# Patient Record
Sex: Male | Born: 1935 | Race: White | Hispanic: No | Marital: Married | Smoking: Former smoker
Health system: Southern US, Community
[De-identification: ages and names within clinical notes are randomized; demographics above are authoritative.]

## PROBLEM LIST (undated history)

## (undated) DIAGNOSIS — C801 Malignant (primary) neoplasm, unspecified: Secondary | ICD-10-CM

## (undated) DIAGNOSIS — J189 Pneumonia, unspecified organism: Secondary | ICD-10-CM

## (undated) DIAGNOSIS — I639 Cerebral infarction, unspecified: Secondary | ICD-10-CM

## (undated) DIAGNOSIS — H9190 Unspecified hearing loss, unspecified ear: Secondary | ICD-10-CM

## (undated) HISTORY — PX: KIDNEY SURGERY: SHX687

## (undated) HISTORY — PX: APPENDECTOMY: SHX54

## (undated) HISTORY — DX: Unspecified hearing loss, unspecified ear: H91.90

---

## 1997-11-20 ENCOUNTER — Other Ambulatory Visit: Admission: RE | Admit: 1997-11-20 | Discharge: 1997-11-20 | Payer: Self-pay | Admitting: Urology

## 1999-11-24 ENCOUNTER — Encounter (INDEPENDENT_AMBULATORY_CARE_PROVIDER_SITE_OTHER): Payer: Self-pay | Admitting: *Deleted

## 1999-11-24 ENCOUNTER — Ambulatory Visit (HOSPITAL_COMMUNITY): Admission: RE | Admit: 1999-11-24 | Discharge: 1999-11-24 | Payer: Self-pay | Admitting: Gastroenterology

## 2002-01-26 ENCOUNTER — Ambulatory Visit (HOSPITAL_COMMUNITY): Admission: RE | Admit: 2002-01-26 | Discharge: 2002-01-26 | Payer: Self-pay | Admitting: Anesthesiology

## 2005-01-05 ENCOUNTER — Ambulatory Visit (HOSPITAL_COMMUNITY): Admission: RE | Admit: 2005-01-05 | Discharge: 2005-01-05 | Payer: Self-pay | Admitting: Gastroenterology

## 2007-04-18 ENCOUNTER — Encounter: Admission: RE | Admit: 2007-04-18 | Discharge: 2007-04-18 | Payer: Self-pay | Admitting: Family Medicine

## 2008-05-05 ENCOUNTER — Inpatient Hospital Stay (HOSPITAL_COMMUNITY): Admission: EM | Admit: 2008-05-05 | Discharge: 2008-05-06 | Payer: Self-pay | Admitting: General Surgery

## 2008-05-24 ENCOUNTER — Ambulatory Visit (HOSPITAL_COMMUNITY): Admission: RE | Admit: 2008-05-24 | Discharge: 2008-05-24 | Payer: Self-pay | Admitting: Surgery

## 2008-06-26 ENCOUNTER — Ambulatory Visit (HOSPITAL_COMMUNITY): Admission: RE | Admit: 2008-06-26 | Discharge: 2008-06-26 | Payer: Self-pay | Admitting: Gastroenterology

## 2008-06-26 ENCOUNTER — Encounter (INDEPENDENT_AMBULATORY_CARE_PROVIDER_SITE_OTHER): Payer: Self-pay | Admitting: Gastroenterology

## 2008-07-04 ENCOUNTER — Encounter: Payer: Self-pay | Admitting: Urology

## 2008-07-04 ENCOUNTER — Inpatient Hospital Stay (HOSPITAL_COMMUNITY): Admission: AD | Admit: 2008-07-04 | Discharge: 2008-07-08 | Payer: Self-pay | Admitting: Urology

## 2008-12-13 ENCOUNTER — Ambulatory Visit (HOSPITAL_COMMUNITY): Admission: RE | Admit: 2008-12-13 | Discharge: 2008-12-13 | Payer: Self-pay | Admitting: Urology

## 2009-08-22 ENCOUNTER — Encounter (HOSPITAL_COMMUNITY): Admission: RE | Admit: 2009-08-22 | Discharge: 2009-11-06 | Payer: Self-pay | Admitting: Urology

## 2009-09-20 ENCOUNTER — Ambulatory Visit (HOSPITAL_BASED_OUTPATIENT_CLINIC_OR_DEPARTMENT_OTHER): Admission: RE | Admit: 2009-09-20 | Discharge: 2009-09-21 | Payer: Self-pay | Admitting: Urology

## 2010-09-01 ENCOUNTER — Other Ambulatory Visit: Payer: Self-pay | Admitting: Gastroenterology

## 2010-10-15 LAB — POCT HEMOGLOBIN-HEMACUE: Hemoglobin: 9.9 g/dL — ABNORMAL LOW (ref 13.0–17.0)

## 2010-12-08 ENCOUNTER — Other Ambulatory Visit: Payer: Self-pay | Admitting: Dermatology

## 2010-12-09 NOTE — Op Note (Signed)
[name], [name]               ACCOUNT NO.:  [id number]   MEDICAL RECORD NO.:  [id number]          PATIENT TYPE:  AMB   LOCATION:  ENDO                         FACILITY:  [facility]   PHYSICIAN:  James L. Malon Kindle., M.D.DATE OF BIRTH:  12/21/1925   DATE OF PROCEDURE:  06/26/2008  DATE OF DISCHARGE:                               OPERATIVE REPORT   PROCEDURE:  Colonoscopy and polypectomy.   MEDICATIONS:  1. Fentanyl 100 mcg.  2. Versed 8.5 mg IV.   INDICATIONS:  The patient was seen at a hospital and had inflammatory  process of the cecum.  Started on antibiotics.  Repeat CT scan showed  this was improving.  It was felt this was likely appendicitis that had  walled off.  He is clinically much better and is due to have an elective  appendectomy.  The procedure was done to make certain that the remainder  of the colon is okay and that there is no cecal mass there.  The patient  does have a strong family history of colon cancer.   SCOPE:  Pentax pediatric scope.   DESCRIPTION OF PROCEDURE:  The procedure had been explained and patient  consent obtained.  In the left lateral decubitus position, digital exam  was performed and the scope was inserted.  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  He had scattered  diverticula throughout the colon of a minimal nature.  With the patient  in the right lateral decubitus position the cecum was reached.  The  appendix was seen well and photographed.  There was no inflammation.  The terminal ileum was entered for about 10 cm and was normal.  The  scope was withdrawn, and in the distal ascending colon a 0.75 cm sessile  polyp was encountered and removed with the snare.  There was no  bleeding.  The remainder of the colon was free of polyps or other  lesions other than the scattered diverticula and the radiation  proctitis, as noted.  The scope was withdrawn.  The patient tolerated  the procedure well.   ASSESSMENT:  1. Abnormal CT, probably due to appendicitis with no other significant      findings on colonoscopy.  2. Ascending colon polyp, removed.  3. Strong family history of colon cancer in parents and a sister.  4. Radiation proctitis.  5. Scattered diverticula.   PLAN:  We will have the patient followup with Dr. Jamey Ripa for an  appendectomy as planned.  Would recommend repeat colonoscopy in 3 years.  Routine postpolypectomy instructions.           ______________________________  Llana Aliment Malon Kindle., M.D.     Waldron Session  D:  06/26/2008  T:  06/26/2008  Job:  [id number]   cc:   Currie Paris, M.D.  [address]  [place]  [place] [address]   Bryan Lemma. Manus Gunning, M.D.  Fax: [phone]

## 2010-12-09 NOTE — Op Note (Signed)
[name], GESKE               ACCOUNT NO.:  [id number]   MEDICAL RECORD NO.:  [id number]          PATIENT TYPE:  AMB   LOCATION:  DAY                          FACILITY:  [facility]   PHYSICIAN:  Currie Paris, M.D.DATE OF BIRTH:  September 01, 1925   DATE OF PROCEDURE:  07/04/2008  DATE OF DISCHARGE:                               OPERATIVE REPORT   PREOPERATIVE DIAGNOSIS:  Chronic appendicitis.   POSTOPERATIVE DIAGNOSIS:  Chronic appendicitis.   OPERATION:  Laparoscopic appendectomy.   SURGEON:  Currie Paris, M.D.   ASSISTANT:  Bertram Millard. Dahlstedt, M.D.   ANESTHESIA:  General.   CLINICAL HISTORY:  This is an 77 year old gentleman who is about 6 to 8  weeks status post episode of appendicitis diagnosed on CT as a phlegmon.  He was treated with antibiotics and now comes for elective appendectomy.  At the time of his CT scan, he was found to have a renal cell tumor and  at the conclusion of the appendectomy, Dr. Retta Diones planned to do  cryoablation.   DESCRIPTION OF PROCEDURE:  The patient was seen in the holding area.  He  had no further questions.  We reviewed plans for surgery as outlined  above.   The patient was taken to the operating room and after satisfactory  general endotracheal anesthesia had been obtained, catheter was placed  and the patient carefully positioned so that Dr. Retta Diones could perform  his cryoablation of the right kidney.  The abdomen was then prepped and  draped.  The time-out was done.   I used 0.25% plain Marcaine for each incision.  I made an umbilical  incision and opened the fascia, placed a pursestring and introduced the  Hasson.  The abdomen was insufflated to 15.   Under direct vision a 5 mm trocar was placed in the right upper quadrant  and another on the left lower quadrant.   Initially I could not visualize the appendix with the camera in the  umbilical port as the terminal ileum was overlying the area.  I put a 30  degrees 5  mm scope in the left lower quadrant and was able then to see  the tip of the appendix and this was able to be grasped.  With gentle  blunt dissection, some flimsy adhesions that looked like some chronic  inflammatory changes were dissected up and I was able to visualize the  mesoappendix.  The mesoappendix was divided the harmonic scalpel down to  the base of the appendix.  I had it cleaned off  the base clearly  identified.  I put  the Endo-GIA in and fired it across the base.  The appendix was put in a  bag and brought out the umbilical port.  We inspected the staple line  and it appeared completely intact.  There is no bleeding.   At this point Dr. Retta Diones took over to perform the cryoablation.      Currie Paris, M.D.  Electronically Signed     CJS/MEDQ  D:  07/04/2008  T:  07/04/2008  Job:  [id number]   cc:  Bertram Millard. Dahlstedt, M.D.  Fax: [phone]   Llana Aliment. Malon Kindle., M.D.  Fax: [phone]

## 2010-12-09 NOTE — Op Note (Signed)
[name], [name]               ACCOUNT NO.:  [id number]   MEDICAL RECORD NO.:  [id number]          PATIENT TYPE:  OIB   LOCATION:  1425                         FACILITY:  [facility]   PHYSICIAN:  Bertram Millard. Dahlstedt, M.D.DATE OF BIRTH:  1925-12-29   DATE OF PROCEDURE:  07/04/2008  DATE OF DISCHARGE:                               OPERATIVE REPORT   PREOPERATIVE DIAGNOSIS:  Right upper pole renal mass, 3.8 cm.   POSTOPERATIVE DIAGNOSIS:  Right upper pole renal mass, 3.8 cm.   PROCEDURE:  Laparoscopic-assisted cryoablation of right upper pole renal  mass.   SURGEON:  Bertram Millard. Dahlstedt, M.D.   FIRST ASSISTANT:  Aldean Baker, MD   COMPLICATIONS:  None.   ESTIMATED BLOOD LOSS:  Minimal.   BRIEF HISTORY:  Mr. Leitzel is an 77 year old gentleman who presented in  the fall with the right lower quadrant pain.  He was vacationing at the  time in [place].  He had had intermittent pain before then.  He  presented to the emergency room at a hospital in [facility].  He was  found to have a right lower quadrant mass.  This ended up being chronic  appendicitis.  At that time as well, the patient had evidence of a 3.8-  cm right upper pole mass suspicious for renal cell carcinoma.  It was  enhancing.  There was no evidence of metastatic disease.   The patient was seen by Dr. Jamey Ripa in addition to me.  Dr. Jamey Ripa at  this point plans an appendectomy.  His colon has been cleared by Dr.  Sherin Quarry, with recent colonoscopy.  Due to the patient's age and the  fairly exophytic nature of his right upper pole mass, it was suggested  that he undergo cryo ablation rather than partial nephrectomy.  Risks  and complications as well as alternatives have been discussed with the  patient and his wife.  They desire to proceed.   DESCRIPTION OF PROCEDURE:  After successful induction of general  anesthetic, the patient was placed in the right side up position, the  table flexed.  His bladder was  decompressed with a Foley catheter.  All  extremities were padded appropriately.  His entire abdomen was prepped  and draped.  Dr. Jamey Ripa then commenced with port site placement for  laparoscopic appendectomy.  This will be dictated as a separate  procedure.   Following completion of the laparoscopic appendectomy, I then began with  laparoscopic dissection of his right kidney.  In addition to the  subumbilical, right upper quadrant and left lower quadrant trocar  placements, I placed a 5 mm trocar and the upper abdomen just to the  right of midline under direct vision.  A right lower quadrant 5 mm  trocar was also placed.  The right colon was mobilized, and the hepatic  flexure taken down using the Harmonic scalpel.  Once the colon was  mobilized, it was evident that there was a mass visible through Gerota's  fascia.  The lower pole of the kidney was mobilized, first by  identifying the ureter.  Dissection was carried down on  the psoas  muscle, moving in a superior direction such that the lower pole was  mobilized.  The medial aspect of the kidney was dissected, with the  duodenum mobilized medially.  The superior pole of the kidney was then  mobilized.  This was somewhat difficult getting the entire kidney, the  upper pole, mobilized due to the fairly dense attachments.  The  attachments between the upper pole of the kidney and the liver were  carefully taken down with the Harmonic scalpel.  Once adequate  mobilization was completed, it was felt that the kidney could be  mobilized anteriorly enough, with the liver retracted superiorly, to  place needles for the cryotherapy through the right upper quadrant.  Four separate 14-gauge Angiocaths were placed through the right upper  quadrant after a finder needle was passed, verifying that these  Angiocaths were directly above the renal mass.  Four angiocath needles  were placed, such that they were each approximately 9-10 mm apart in a   symmetric, square fashion.  The cryo needles were then placed in a  symmetric fashion into the renal mass, such that they were distributed  evenly, so that a symmetric ice ball would form.  Following placement of  these needles just under 4 cm into the renal mass, a 15 second freeze  cycle was used to fix the needles in.  At this point a Tru-Cut biopsy  needle was placed and two  cores were taken for biopsies from this renal  mass and sent as biopsy of renal mass.  Following this, the first  freeze cycle was performed for 10 minutes.  A thermal sensor was placed  about 5 mm outside the kidney, into the parenchyma.  Visual inspection  during the freezing cycle, and using the ultrasound probe, demonstrated  an excellent ice ball encompassing the whole renal tumor.  A 6-minute  thaw cycle was performed, and then a second 8-minute freeze cycle.  Following this, the ice ball was thawed again, and the ice rinsed off  with the irrigator.  Once adequate thaw was performed, the needles were  removed.  There was no significant bleeding at this point.  FloSeal was  placed over all the puncture sites, and Surgicel was placed over top of  that.  At this point, inspection of the entire dissected kidney revealed  no significant bleeding.  Trocars were all removed under direct vision,  the pneumoperitoneum was released.  Angiocaths were removed from the  anterior abdomen.  Dr. Jamey Ripa had placed a pursestring suture in the  patient's fascia using 0 Vicryl at the Hasson cannula port.  This was  sutured shut.  The 5 mm trocar sites were not closed at the fascial  level.  Skin staples were placed.  Dry sterile dressings were then  placed..  The patient tolerated procedure well.  He was awakened.  He  was taken to the PACU in stable condition.      Bertram Millard. Dahlstedt, M.D.  Electronically Signed     SMD/MEDQ  D:  07/05/2008  T:  07/05/2008  Job:  [id number]   cc:   Currie Paris, M.D.  [address]  [place]  [place] [address]   Bryan Lemma. Manus Gunning, M.D.  Fax: [phone]

## 2010-12-09 NOTE — H&P (Signed)
[name], [name]               ACCOUNT NO.:  [id number]   MEDICAL RECORD NO.:  [id number]          PATIENT TYPE:  INP   LOCATION:                               FACILITY:  [facility]   PHYSICIAN:  Adolph Pollack, M.D.DATE OF BIRTH:  April 04, 1926   DATE OF ADMISSION:  05/05/2008  DATE OF DISCHARGE:  05/06/2008                              HISTORY & PHYSICAL   REASON FOR ADMISSION:  Right lower quadrant abscess.   HISTORY:  [name] is an 77 year old male who had the onset of severe  right lower quadrant pain with nausea and vomiting on October 7 while he  was at [facility].  He sought attention in the emergency  department the next day, and was noted to have a leukocytosis of 16,000.  He was sent for a CT scan which demonstrated a right lower quadrant  abscess with a questionable mass in the cecum as well as a mass in the  upper right kidney.  However, he was not given oral contrast.  It was  recommended that he be admitted to the hospital, started on antibiotics  and then to have surgical consultation, but the patient did not want to  do this and instead wanted to come back to [place].  His daughter has  now brought him back to [facility] today for surgical evaluation.  Currently he said he feels fine.  He is hungry.  He is having some  diarrhea but no fever or chills.  He had been given Cipro and Flagyl to  take and he has been taking them as directed.   Of note was that about 2-3 months ago, he had some similar pain and an  episode of vomiting, and the pain resolved.  For the past 2 or 3 weeks  however, he has been having intermittent episodes, but nothing quite  this bad.   PAST MEDICAL HISTORY:  1. Prostate cancer.  2. Hypertension.   PREVIOUS OPERATIONS:  1. Tonsillectomy.  2. Radioactive seed implantation for his prostate cancer.   DRUG ALLERGIES:  None.   CURRENT MEDICATIONS:  Cipro and Flagyl.  He also takes HCTZ p.r.n. for  high blood pressure.   SOCIAL HISTORY:  He is married.  A former smoker (quit in 1959).  Has  one alcoholic beverage a night on average.   FAMILY HISTORY:  Notable for colon cancer in his father as well as heart  disease.   REVIEW OF SYSTEMS:  GENERAL:  He has not had a problem with unexplained  weight loss.  CARDIOVASCULAR:  Denies any heart disease.  PULMONARY:  Denies asthma, pneumonia.  GI: Denies any melena, hematochezia, peptic  ulcer disease, hepatitis, diverticulitis.  GU:  Denies kidney stones.  He did have external beam radiation therapy for his prostate cancer.  ENDOCRINE:  No diabetes, hypercholesterolemia.  NEUROLOGIC:  No strokes  or seizures.  HEMATOLOGIC:  No bleeding disorders, blood clots or  transfusions.   PHYSICAL EXAM:  GENERAL:  An elderly male in no acute distress.  Pleasant and cooperative.  VITAL SIGNS: Temperature is 97.3, blood pressure is 157/81, pulse  of 65.  EYES:  Extraocular motions intact.  No icterus.  NECK: Supple without masses or obvious thyroid enlargement.  RESPIRATORY: Breath sounds equal and clear.  Respirations unlabored.  CARDIOVASCULAR:  Regular rate. regular rhythm.  No murmur.  ABDOMEN: Soft.  There is some mild right lower quadrant tenderness and  fullness, but no peritoneal signs.  His abdomen is flat, nondistended.  He has active bowel sounds.  MUSCULOSKELETAL:  He has some trace edema bilaterally in the lower  extremities.  Good range of motion.   LABORATORY DATA:  Done at the emergency department on May 03, 2008,  demonstrated a white blood cell count of 16,000, hemoglobin of 13.4.  Electrolytes within normal limits and albumin was 3.7.  Urinalysis  within normal limits as well.  I have reviewed the CT scan on the DVD.   IMPRESSION:  Right lower quadrant abscess, etiology unclear this time.  Differential diagnosis could be chronic perforated appendicitis with  abscess, perforated cecal tumor, possible perforated cecal  diverticulitis.  Currently he  appears well without any peritoneal signs.   PLAN:  Admit to the hospital and convert to IV antibiotics.  Get an  orally and IV contrasted CT scan.  Based on that we will need to discuss  whether we ought to do percutaneous drainage of the abscess.  I would  also like to define this process in the cecum a little better with the  oral contrast CT scan.  Both he and wife understand the plan and rare in  agreement.      Adolph Pollack, M.D.  Electronically Signed     TJR/MEDQ  D:  05/05/2008  T:  05/06/2008  Job:  [id number]   cc:   Bertram Millard. Dahlstedt, M.D.  Fax: [phone]   Inger, Dr. Molly Maduro ?

## 2010-12-12 NOTE — Procedures (Signed)
[place]. [facility]  Patient:    [name], [name] Visit Number: [id number] MRN: [id number]          Service Type: DSU Location: [facility] 2899 [id number] Attending Physician:  Orland Mustard Dictated by:   Llana Aliment Randa Evens, M.D. Admit Date:  01/26/2002 Discharge Date: 01/26/2002   CC:         Dyanne Carrel, M.D.   Procedure Report  DATE OF BIRTH:  02/11/1926  PROCEDURE:  Colonoscopy.  MEDICATIONS:  Fentanyl 75 mcg, Versed 6 mg IV.  SCOPE: Pediatric Olympus video colonoscope.  INDICATIONS:  Followup for colon polyps.  DESCRIPTION OF PROCEDURE:  The procedure had been explained, and patient consent obtained.  With the patient in the lithotomy position, the Olympus pediatric video colonoscope was inserted and advanced under direct visualization.  The prep was excellent.  We were able to reach the cecum without difficulty.  The ileocecal valve and appendiceal orifice seen.  The scope withdrawn in the cecum, ascending colon, transverse colon.  The transverse colon, splenic flexure, descending and sigmoid colon were seen well.  No polyps or other lesions seen.  The patient tolerated the procedure well and was maintained on low-flow oxygen and pulse oximetry throughout the procedure.  ASSESSMENT: Essentially normal colonoscopy.  No evidence of further polyps.  PLAN: Will recommend proceeding with procedure in three years. Dictated by:   Llana Aliment. Randa Evens, M.D. Attending Physician:  Orland Mustard DD:  01/26/02 TD:  01/30/02 Job: [id number]

## 2010-12-12 NOTE — Op Note (Signed)
NAMESHRIHAAN, PORZIO               ACCOUNT NO.:  [id number]   MEDICAL RECORD NO.:  [id number]          PATIENT TYPE:  AMB   LOCATION:  ENDO                         FACILITY:  [facility]   PHYSICIAN:  James L. Malon Kindle., M.D.DATE OF BIRTH:  Dec 19, 1925   DATE OF PROCEDURE:  01/05/2005  DATE OF DISCHARGE:                                 OPERATIVE REPORT   PROCEDURE:  Colonoscopy.   MEDICATIONS:  Fentanyl 75 mcg, Versed 6 mg IV.   SCOPE:  Olympus pediatric adjustable colonoscope.   INDICATIONS FOR PROCEDURE:  This is done as a follow-up procedure.  The  patient has had previous polyps removed and has a family history of colon  cancer.   DESCRIPTION OF PROCEDURE:  The procedure is explained to the patient and  consent obtained.  In the left lateral decubitus position, the Olympus scope  was inserted and advanced.  The prep was excellent.  We were able to reach  the cecum without difficulty.  The ileocecal valve and appendiceal orifice  seen.  Scope withdrawn.  Cecum, ascending colon, transverse colon, splenic  flexure, descending colon, and sigmoid were all normal.  No diverticulosis.  No polyps were found throughout.  Mucosa was normal.  Rectum free of polyps,  some internal hemorrhoids.  The scope was withdrawn.  The patient tolerated  the procedure well.   ASSESSMENT:  1.  History of colon polyps in the past with negative colonoscopy at this      time.  V12.72.  2.  Family history of colon cancer.  V16.0.   PLAN:  Yearly hemoccults and will repeat colonoscopy in 5 years or for  specific reasons such as positive stool.       JLE/MEDQ  D:  01/05/2005  T:  01/05/2005  Job:  [id number]   cc:   Bryan Lemma. Manus Gunning, M.D.  [address] [place]  [place] [address]  Fax: [phone]

## 2010-12-12 NOTE — Procedures (Signed)
[place]  Patient:    [name], [name]                      MRN: [id number] Proc. Date: 11/24/99 Adm. Date:  [id number] Attending:  Orland Mustard CC:         Dyanne Carrel, M.D.                           Procedure Report  PROCEDURE:  Colonoscopy and polypectomy  MEDICATIONS:  Fentanyl 100 mcg, Versed 7.5 mg IV.  INDICATIONS:  A pleasant 77 year old gentleman, who had prostate cancer and has a strong family history of colon cancer.  Father died at 45 with colon cancer.  Colonoscopy is performed to look for any colon neoplasia.  DESCRIPTION OF PROCEDURE:  Procedure has been explained to patient, consent obtained.  Patient in left lateral decubitus position.  Olympus adult video colonoscope inserted, advanced under direct visualization.  Prep excellent, able to advance to the cecum using position changes and abdominal pressure. Ileocecal valve was seen, right lower quadrant was transilluminated.  Scope withdrawn.  Cecum, ascending colon, hepatic flexure, transverse colon see well.  In the vicinity, the distal transverse colon, splenic flexure, descending colon in that area, three polyps were found, each approximately 0.5 cm in diameter.  They each were removed with a snare and sucked through the scope.  The remainder of the descending colon and sigmoid were unremarkable. Some internal hemorrhoids were seen in rectum upon removal of the scope. Scope withdrawn.  Patient tolerated the procedure well, was maintained on low-flow oxygen and pulse oximeter throughout the procedure with no obvious problem.  ASSESSMENT:  Splenic flexure, descending colon polyps removed.  PLAN:  Will recommend repeating procedure in two years. DD:  11/24/99 TD:  11/24/99 Job: [id number]

## 2010-12-12 NOTE — Discharge Summary (Signed)
NAMETIMOTHEUS, SALM               ACCOUNT NO.:  [id number]   MEDICAL RECORD NO.:  [id number]          PATIENT TYPE:  INP   LOCATION:                               FACILITY:  [facility]   PHYSICIAN:  Bertram Millard. Dahlstedt, M.D.DATE OF BIRTH:  [birth year]   DATE OF ADMISSION:  07/04/2008  DATE OF DISCHARGE:  07/08/2008                               DISCHARGE SUMMARY   PRIMARY DIAGNOSES:  1. Chronic appendicitis.  2. Renal cell carcinoma of right kidney, upper pole   SURGICAL PROCEDURES:  July 04, 2008, laparoscopic assisted right  appendectomy and laparoscopic-assisted cryoablation of right renal mass.   BRIEF HISTORY:  Mr. Knupp is an 77 year old male who originally  presented in the late fall with abdominal pain.  He was at [facility] at that time.  CT scan of abdomen revealed a right upper pole  renal mass, as well as a cecal mass.  Further evaluation here in  [place] revealed the patient had an enhancing 3-1/2 cm right upper  pole mass and probable chronic appendicitis.  Dr. Cyndia Bent plans  a laparoscopic assisted appendectomy.  Due to the patient's age and the  exophytic lesion, it was recommended that he undergo attempted  cryoablation of this renal mass.  He presents this time for those  procedures, having been instructed in risks and complications.   HOSPITAL COURSE:  The patient was admitted directly to the operating  room.  He first underwent a laparoscopic right appendectomy by Dr.  Jamey Ripa.  Following that, a laparoscopic assisted biopsy and cryoablation  of the right upper pole renal mass was performed.  He tolerated these  procedures well.  Biopsy of the tumor revealed renal cell carcinoma.  He  had a bit of ileus postoperatively.  This eventually resolved.  He did  not need nasogastric tube.  He was eventually advanced to a regular  diet.  He tolerated this well.  His hematocrit was 26.2% on the 13th,  the day of his discharge.  He was comfortable,  however.  Incisions were  all healing well.  He was discharged on the 13th, in improved condition.   DISCHARGE MEDICATIONS:  Per reconciliation sheet.   DISCHARGE INSTRUCTIONS/FOLLOWUP:  He was given discharge instructions  and follow-up with Dr. Retta Diones in approximately one week for staple  removal.      Bertram Millard. Dahlstedt, M.D.  Electronically Signed     SMD/MEDQ  D:  08/16/2008  T:  08/16/2008  Job:  [id number]

## 2011-04-28 LAB — COMPREHENSIVE METABOLIC PANEL
Alkaline Phosphatase: 76
BUN: 11
Calcium: 9.1
GFR calc Af Amer: >60
GFR calc non Af Amer: >60
Glucose, Bld: 105 — ABNORMAL HIGH
Potassium: 4
Total Protein: 6.1

## 2011-04-28 LAB — CBC
HCT: 36.9 — ABNORMAL LOW
Hemoglobin: 12.4 — ABNORMAL LOW
MCV: 88.8
Platelets: 349
RDW: 12.4

## 2011-04-28 LAB — APTT: aPTT: 41 — ABNORMAL HIGH

## 2011-04-28 LAB — PROTIME-INR
INR: 1.2
Prothrombin Time: 15.9 — ABNORMAL HIGH

## 2011-05-01 LAB — CBC
HCT: 27.7 % — ABNORMAL LOW (ref 39.0–52.0)
HCT: 40.5 % (ref 39.0–52.0)
Hemoglobin: 13.7 g/dL (ref 13.0–17.0)
Hemoglobin: 14.4 g/dL (ref 13.0–17.0)
Hemoglobin: 9.2 g/dL — ABNORMAL LOW (ref 13.0–17.0)
Hemoglobin: 9.7 g/dL — ABNORMAL LOW (ref 13.0–17.0)
MCHC: 33.8 g/dL (ref 30.0–36.0)
MCHC: 34.3 g/dL (ref 30.0–36.0)
MCV: 88.9 fL (ref 78.0–100.0)
MCV: 89.4 fL (ref 78.0–100.0)
MCV: 89.8 fL (ref 78.0–100.0)
Platelets: 203 10*3/uL (ref 150–400)
Platelets: 255 10*3/uL (ref 150–400)
RBC: 2.97 MIL/uL — ABNORMAL LOW (ref 4.22–5.81)
RBC: 3.1 MIL/uL — ABNORMAL LOW (ref 4.22–5.81)
RBC: 3.65 MIL/uL — ABNORMAL LOW (ref 4.22–5.81)
RBC: 4.74 MIL/uL (ref 4.22–5.81)
RDW: 13.7 % (ref 11.5–15.5)
WBC: 10 10*3/uL (ref 4.0–10.5)
WBC: 10.3 10*3/uL (ref 4.0–10.5)
WBC: 14.3 10*3/uL — ABNORMAL HIGH (ref 4.0–10.5)
WBC: 6.3 10*3/uL (ref 4.0–10.5)
WBC: 9.3 10*3/uL (ref 4.0–10.5)

## 2011-05-01 LAB — BASIC METABOLIC PANEL
BUN: 14 mg/dL (ref 6–23)
CO2: 24 mEq/L (ref 19–32)
Chloride: 100 mEq/L (ref 96–112)
Chloride: 99 mEq/L (ref 96–112)
Creatinine, Ser: 0.84 mg/dL (ref 0.4–1.5)
GFR calc non Af Amer: >60 mL/min (ref >60)
Glucose, Bld: 141 mg/dL — ABNORMAL HIGH (ref 70–99)
Potassium: 3.9 mEq/L (ref 3.5–5.1)
Sodium: 130 mEq/L — ABNORMAL LOW (ref 135–145)

## 2011-05-01 LAB — COMPREHENSIVE METABOLIC PANEL
AST: 21 U/L (ref 0–37)
Albumin: 3.7 g/dL (ref 3.5–5.2)
CO2: 27 mEq/L (ref 19–32)
Calcium: 9.8 mg/dL (ref 8.4–10.5)
Creatinine, Ser: 0.95 mg/dL (ref 0.4–1.5)
Sodium: 136 mEq/L (ref 135–145)
Total Bilirubin: 1 mg/dL (ref 0.3–1.2)
Total Protein: 6.4 g/dL (ref 6.0–8.3)

## 2011-05-01 LAB — URINALYSIS, ROUTINE W REFLEX MICROSCOPIC
Glucose, UA: NEGATIVE mg/dL
Hgb urine dipstick: NEGATIVE
Ketones, ur: NEGATIVE mg/dL
Protein, ur: NEGATIVE mg/dL

## 2011-05-01 LAB — DIFFERENTIAL
Basophils Absolute: 0 10*3/uL (ref 0.0–0.1)
Basophils Absolute: 0 10*3/uL (ref 0.0–0.1)
Basophils Relative: 0 % (ref 0–1)
Eosinophils Absolute: 0 10*3/uL (ref 0.0–0.7)
Eosinophils Absolute: 0.1 10*3/uL (ref 0.0–0.7)
Eosinophils Relative: 0 % (ref 0–5)
Lymphs Abs: 1 10*3/uL (ref 0.7–4.0)
Monocytes Absolute: 0.9 10*3/uL (ref 0.1–1.0)
Monocytes Relative: 9 % (ref 3–12)
Neutrophils Relative %: 77 % (ref 43–77)

## 2011-05-01 LAB — HEMOGLOBIN AND HEMATOCRIT, BLOOD
HCT: 26.2 % — ABNORMAL LOW (ref 39.0–52.0)
HCT: 27.5 % — ABNORMAL LOW (ref 39.0–52.0)
HCT: 28.8 % — ABNORMAL LOW (ref 39.0–52.0)
Hemoglobin: 8.8 g/dL — ABNORMAL LOW (ref 13.0–17.0)
Hemoglobin: 9.4 g/dL — ABNORMAL LOW (ref 13.0–17.0)

## 2011-05-01 LAB — TYPE AND SCREEN: ABO/RH(D): O POS

## 2011-05-01 LAB — PSA: PSA: 1.24 ng/mL (ref 0.10–4.00)

## 2011-05-01 LAB — FREE PSA: PSA, Free Pct: 8 % — ABNORMAL LOW (ref >25)

## 2011-07-31 DIAGNOSIS — L570 Actinic keratosis: Secondary | ICD-10-CM | POA: Diagnosis not present

## 2011-07-31 DIAGNOSIS — L578 Other skin changes due to chronic exposure to nonionizing radiation: Secondary | ICD-10-CM | POA: Diagnosis not present

## 2011-08-11 ENCOUNTER — Ambulatory Visit (HOSPITAL_COMMUNITY)
Admission: RE | Admit: 2011-08-11 | Discharge: 2011-08-11 | Disposition: A | Discharge status: Home / Self Care | Payer: Medicare Other | Source: Ambulatory Visit | Attending: Urology | Admitting: Urology

## 2011-08-11 ENCOUNTER — Other Ambulatory Visit: Payer: Self-pay | Admitting: Urology

## 2011-08-11 DIAGNOSIS — C649 Malignant neoplasm of unspecified kidney, except renal pelvis: Secondary | ICD-10-CM

## 2011-08-11 DIAGNOSIS — J984 Other disorders of lung: Secondary | ICD-10-CM | POA: Insufficient documentation

## 2011-08-11 DIAGNOSIS — C61 Malignant neoplasm of prostate: Secondary | ICD-10-CM | POA: Diagnosis not present

## 2011-08-11 DIAGNOSIS — N320 Bladder-neck obstruction: Secondary | ICD-10-CM | POA: Diagnosis not present

## 2011-08-12 DIAGNOSIS — C649 Malignant neoplasm of unspecified kidney, except renal pelvis: Secondary | ICD-10-CM | POA: Diagnosis not present

## 2011-08-13 DIAGNOSIS — H26499 Other secondary cataract, unspecified eye: Secondary | ICD-10-CM | POA: Diagnosis not present

## 2011-08-14 DIAGNOSIS — C649 Malignant neoplasm of unspecified kidney, except renal pelvis: Secondary | ICD-10-CM | POA: Diagnosis not present

## 2011-08-14 DIAGNOSIS — C61 Malignant neoplasm of prostate: Secondary | ICD-10-CM | POA: Diagnosis not present

## 2011-08-20 DIAGNOSIS — H26499 Other secondary cataract, unspecified eye: Secondary | ICD-10-CM | POA: Diagnosis not present

## 2011-12-04 DIAGNOSIS — L570 Actinic keratosis: Secondary | ICD-10-CM | POA: Diagnosis not present

## 2011-12-04 DIAGNOSIS — L578 Other skin changes due to chronic exposure to nonionizing radiation: Secondary | ICD-10-CM | POA: Diagnosis not present

## 2011-12-04 DIAGNOSIS — L821 Other seborrheic keratosis: Secondary | ICD-10-CM | POA: Diagnosis not present

## 2012-02-04 DIAGNOSIS — C649 Malignant neoplasm of unspecified kidney, except renal pelvis: Secondary | ICD-10-CM | POA: Diagnosis not present

## 2012-02-04 DIAGNOSIS — C61 Malignant neoplasm of prostate: Secondary | ICD-10-CM | POA: Diagnosis not present

## 2012-04-30 DIAGNOSIS — Z23 Encounter for immunization: Secondary | ICD-10-CM | POA: Diagnosis not present

## 2012-07-08 ENCOUNTER — Other Ambulatory Visit: Payer: Self-pay | Admitting: Dermatology

## 2012-07-08 DIAGNOSIS — L570 Actinic keratosis: Secondary | ICD-10-CM | POA: Diagnosis not present

## 2012-07-08 DIAGNOSIS — L819 Disorder of pigmentation, unspecified: Secondary | ICD-10-CM | POA: Diagnosis not present

## 2012-07-08 DIAGNOSIS — Z85828 Personal history of other malignant neoplasm of skin: Secondary | ICD-10-CM | POA: Diagnosis not present

## 2012-07-08 DIAGNOSIS — D0439 Carcinoma in situ of skin of other parts of face: Secondary | ICD-10-CM | POA: Diagnosis not present

## 2012-07-08 DIAGNOSIS — L821 Other seborrheic keratosis: Secondary | ICD-10-CM | POA: Diagnosis not present

## 2012-07-08 DIAGNOSIS — D0420 Carcinoma in situ of skin of unspecified ear and external auricular canal: Secondary | ICD-10-CM | POA: Diagnosis not present

## 2012-07-08 DIAGNOSIS — D485 Neoplasm of uncertain behavior of skin: Secondary | ICD-10-CM | POA: Diagnosis not present

## 2012-07-08 DIAGNOSIS — L578 Other skin changes due to chronic exposure to nonionizing radiation: Secondary | ICD-10-CM | POA: Diagnosis not present

## 2012-07-26 DIAGNOSIS — C44221 Squamous cell carcinoma of skin of unspecified ear and external auricular canal: Secondary | ICD-10-CM | POA: Diagnosis not present

## 2012-07-26 DIAGNOSIS — L570 Actinic keratosis: Secondary | ICD-10-CM | POA: Diagnosis not present

## 2012-08-08 DIAGNOSIS — C649 Malignant neoplasm of unspecified kidney, except renal pelvis: Secondary | ICD-10-CM | POA: Diagnosis not present

## 2012-08-09 DIAGNOSIS — C61 Malignant neoplasm of prostate: Secondary | ICD-10-CM | POA: Diagnosis not present

## 2012-08-09 DIAGNOSIS — C649 Malignant neoplasm of unspecified kidney, except renal pelvis: Secondary | ICD-10-CM | POA: Diagnosis not present

## 2012-08-10 DIAGNOSIS — C649 Malignant neoplasm of unspecified kidney, except renal pelvis: Secondary | ICD-10-CM | POA: Diagnosis not present

## 2012-08-10 DIAGNOSIS — C61 Malignant neoplasm of prostate: Secondary | ICD-10-CM | POA: Diagnosis not present

## 2012-09-16 DIAGNOSIS — K625 Hemorrhage of anus and rectum: Secondary | ICD-10-CM | POA: Diagnosis not present

## 2012-09-16 DIAGNOSIS — K921 Melena: Secondary | ICD-10-CM | POA: Diagnosis not present

## 2012-09-16 DIAGNOSIS — C61 Malignant neoplasm of prostate: Secondary | ICD-10-CM | POA: Diagnosis not present

## 2012-09-19 DIAGNOSIS — K921 Melena: Secondary | ICD-10-CM | POA: Diagnosis not present

## 2012-10-10 DIAGNOSIS — K921 Melena: Secondary | ICD-10-CM | POA: Diagnosis not present

## 2012-10-13 DIAGNOSIS — K921 Melena: Secondary | ICD-10-CM | POA: Diagnosis not present

## 2012-10-21 DIAGNOSIS — K921 Melena: Secondary | ICD-10-CM | POA: Diagnosis not present

## 2012-12-12 ENCOUNTER — Other Ambulatory Visit: Payer: Self-pay | Admitting: Dermatology

## 2012-12-12 DIAGNOSIS — C649 Malignant neoplasm of unspecified kidney, except renal pelvis: Secondary | ICD-10-CM | POA: Diagnosis not present

## 2012-12-12 DIAGNOSIS — Z85828 Personal history of other malignant neoplasm of skin: Secondary | ICD-10-CM | POA: Diagnosis not present

## 2012-12-12 DIAGNOSIS — I10 Essential (primary) hypertension: Secondary | ICD-10-CM | POA: Diagnosis not present

## 2012-12-12 DIAGNOSIS — D485 Neoplasm of uncertain behavior of skin: Secondary | ICD-10-CM | POA: Diagnosis not present

## 2012-12-12 DIAGNOSIS — L570 Actinic keratosis: Secondary | ICD-10-CM | POA: Diagnosis not present

## 2012-12-12 DIAGNOSIS — L578 Other skin changes due to chronic exposure to nonionizing radiation: Secondary | ICD-10-CM | POA: Diagnosis not present

## 2012-12-12 DIAGNOSIS — B079 Viral wart, unspecified: Secondary | ICD-10-CM | POA: Diagnosis not present

## 2012-12-12 DIAGNOSIS — C4492 Squamous cell carcinoma of skin, unspecified: Secondary | ICD-10-CM | POA: Diagnosis not present

## 2012-12-12 DIAGNOSIS — L820 Inflamed seborrheic keratosis: Secondary | ICD-10-CM | POA: Diagnosis not present

## 2012-12-12 DIAGNOSIS — Z0000 Encounter for general adult medical examination without abnormal findings: Secondary | ICD-10-CM | POA: Diagnosis not present

## 2012-12-12 DIAGNOSIS — C61 Malignant neoplasm of prostate: Secondary | ICD-10-CM | POA: Diagnosis not present

## 2012-12-12 DIAGNOSIS — E7800 Pure hypercholesterolemia, unspecified: Secondary | ICD-10-CM | POA: Diagnosis not present

## 2013-02-01 DIAGNOSIS — C61 Malignant neoplasm of prostate: Secondary | ICD-10-CM | POA: Diagnosis not present

## 2013-02-08 DIAGNOSIS — C61 Malignant neoplasm of prostate: Secondary | ICD-10-CM | POA: Diagnosis not present

## 2013-02-08 DIAGNOSIS — C649 Malignant neoplasm of unspecified kidney, except renal pelvis: Secondary | ICD-10-CM | POA: Diagnosis not present

## 2013-05-23 DIAGNOSIS — Z23 Encounter for immunization: Secondary | ICD-10-CM | POA: Diagnosis not present

## 2013-08-08 DIAGNOSIS — K5900 Constipation, unspecified: Secondary | ICD-10-CM | POA: Diagnosis not present

## 2013-08-08 DIAGNOSIS — Z800 Family history of malignant neoplasm of digestive organs: Secondary | ICD-10-CM | POA: Diagnosis not present

## 2013-08-08 DIAGNOSIS — K6289 Other specified diseases of anus and rectum: Secondary | ICD-10-CM | POA: Diagnosis not present

## 2013-08-08 DIAGNOSIS — Z86010 Personal history of colonic polyps: Secondary | ICD-10-CM | POA: Diagnosis not present

## 2013-08-09 DIAGNOSIS — C61 Malignant neoplasm of prostate: Secondary | ICD-10-CM | POA: Diagnosis not present

## 2013-08-09 DIAGNOSIS — C649 Malignant neoplasm of unspecified kidney, except renal pelvis: Secondary | ICD-10-CM | POA: Diagnosis not present

## 2013-08-11 DIAGNOSIS — C61 Malignant neoplasm of prostate: Secondary | ICD-10-CM | POA: Diagnosis not present

## 2013-08-11 DIAGNOSIS — C649 Malignant neoplasm of unspecified kidney, except renal pelvis: Secondary | ICD-10-CM | POA: Diagnosis not present

## 2013-08-17 DIAGNOSIS — K6289 Other specified diseases of anus and rectum: Secondary | ICD-10-CM | POA: Diagnosis not present

## 2013-08-17 DIAGNOSIS — Z86010 Personal history of colonic polyps: Secondary | ICD-10-CM | POA: Diagnosis not present

## 2013-08-17 DIAGNOSIS — Z09 Encounter for follow-up examination after completed treatment for conditions other than malignant neoplasm: Secondary | ICD-10-CM | POA: Diagnosis not present

## 2013-10-06 DIAGNOSIS — J309 Allergic rhinitis, unspecified: Secondary | ICD-10-CM | POA: Diagnosis not present

## 2013-10-14 DIAGNOSIS — J069 Acute upper respiratory infection, unspecified: Secondary | ICD-10-CM | POA: Diagnosis not present

## 2013-12-04 DIAGNOSIS — R1031 Right lower quadrant pain: Secondary | ICD-10-CM | POA: Diagnosis not present

## 2014-02-02 DIAGNOSIS — C61 Malignant neoplasm of prostate: Secondary | ICD-10-CM | POA: Diagnosis not present

## 2014-02-09 DIAGNOSIS — C61 Malignant neoplasm of prostate: Secondary | ICD-10-CM | POA: Diagnosis not present

## 2014-04-19 ENCOUNTER — Other Ambulatory Visit: Payer: Self-pay | Admitting: Dermatology

## 2014-04-19 DIAGNOSIS — C434 Malignant melanoma of scalp and neck: Secondary | ICD-10-CM | POA: Diagnosis not present

## 2014-04-19 DIAGNOSIS — D1801 Hemangioma of skin and subcutaneous tissue: Secondary | ICD-10-CM | POA: Diagnosis not present

## 2014-04-19 DIAGNOSIS — L821 Other seborrheic keratosis: Secondary | ICD-10-CM | POA: Diagnosis not present

## 2014-04-19 DIAGNOSIS — Z85828 Personal history of other malignant neoplasm of skin: Secondary | ICD-10-CM | POA: Diagnosis not present

## 2014-04-19 DIAGNOSIS — C4359 Malignant melanoma of other part of trunk: Secondary | ICD-10-CM | POA: Diagnosis not present

## 2014-05-01 DIAGNOSIS — D034 Melanoma in situ of scalp and neck: Secondary | ICD-10-CM | POA: Diagnosis not present

## 2014-05-01 DIAGNOSIS — L905 Scar conditions and fibrosis of skin: Secondary | ICD-10-CM | POA: Diagnosis not present

## 2014-05-01 DIAGNOSIS — D0359 Melanoma in situ of other part of trunk: Secondary | ICD-10-CM | POA: Diagnosis not present

## 2014-05-01 DIAGNOSIS — L570 Actinic keratosis: Secondary | ICD-10-CM | POA: Diagnosis not present

## 2014-06-08 DIAGNOSIS — Z23 Encounter for immunization: Secondary | ICD-10-CM | POA: Diagnosis not present

## 2014-06-27 ENCOUNTER — Other Ambulatory Visit: Payer: Self-pay | Admitting: Family Medicine

## 2014-06-27 ENCOUNTER — Inpatient Hospital Stay (HOSPITAL_COMMUNITY)
Admission: EM | Admit: 2014-06-27 | Discharge: 2014-07-02 | DRG: 064 | Disposition: A | Discharge status: Rehabilitation Facility | Payer: Medicare Other | Attending: Internal Medicine | Admitting: Internal Medicine

## 2014-06-27 ENCOUNTER — Ambulatory Visit
Admission: RE | Admit: 2014-06-27 | Discharge: 2014-06-27 | Disposition: A | Discharge status: Home / Self Care | Payer: Medicare Other | Source: Ambulatory Visit | Attending: Family Medicine | Admitting: Family Medicine

## 2014-06-27 ENCOUNTER — Encounter (HOSPITAL_COMMUNITY): Payer: Self-pay

## 2014-06-27 ENCOUNTER — Emergency Department (HOSPITAL_COMMUNITY): Payer: Medicare Other

## 2014-06-27 DIAGNOSIS — R059 Cough, unspecified: Secondary | ICD-10-CM

## 2014-06-27 DIAGNOSIS — E876 Hypokalemia: Secondary | ICD-10-CM | POA: Diagnosis present

## 2014-06-27 DIAGNOSIS — E785 Hyperlipidemia, unspecified: Secondary | ICD-10-CM | POA: Diagnosis present

## 2014-06-27 DIAGNOSIS — R05 Cough: Secondary | ICD-10-CM

## 2014-06-27 DIAGNOSIS — E119 Type 2 diabetes mellitus without complications: Secondary | ICD-10-CM | POA: Diagnosis present

## 2014-06-27 DIAGNOSIS — R1312 Dysphagia, oropharyngeal phase: Secondary | ICD-10-CM | POA: Diagnosis present

## 2014-06-27 DIAGNOSIS — R5383 Other fatigue: Secondary | ICD-10-CM

## 2014-06-27 DIAGNOSIS — G8191 Hemiplegia, unspecified affecting right dominant side: Secondary | ICD-10-CM | POA: Diagnosis present

## 2014-06-27 DIAGNOSIS — R471 Dysarthria and anarthria: Secondary | ICD-10-CM | POA: Diagnosis present

## 2014-06-27 DIAGNOSIS — I4891 Unspecified atrial fibrillation: Secondary | ICD-10-CM | POA: Diagnosis present

## 2014-06-27 DIAGNOSIS — J189 Pneumonia, unspecified organism: Secondary | ICD-10-CM | POA: Diagnosis not present

## 2014-06-27 DIAGNOSIS — I63132 Cerebral infarction due to embolism of left carotid artery: Secondary | ICD-10-CM | POA: Diagnosis not present

## 2014-06-27 DIAGNOSIS — I739 Peripheral vascular disease, unspecified: Secondary | ICD-10-CM | POA: Diagnosis present

## 2014-06-27 DIAGNOSIS — I639 Cerebral infarction, unspecified: Secondary | ICD-10-CM | POA: Diagnosis not present

## 2014-06-27 DIAGNOSIS — E860 Dehydration: Secondary | ICD-10-CM | POA: Diagnosis present

## 2014-06-27 DIAGNOSIS — I10 Essential (primary) hypertension: Secondary | ICD-10-CM | POA: Diagnosis present

## 2014-06-27 DIAGNOSIS — I959 Hypotension, unspecified: Secondary | ICD-10-CM | POA: Diagnosis not present

## 2014-06-27 DIAGNOSIS — R0602 Shortness of breath: Secondary | ICD-10-CM | POA: Diagnosis not present

## 2014-06-27 DIAGNOSIS — R531 Weakness: Secondary | ICD-10-CM | POA: Diagnosis not present

## 2014-06-27 DIAGNOSIS — Z87891 Personal history of nicotine dependence: Secondary | ICD-10-CM | POA: Diagnosis not present

## 2014-06-27 DIAGNOSIS — I6523 Occlusion and stenosis of bilateral carotid arteries: Secondary | ICD-10-CM | POA: Diagnosis not present

## 2014-06-27 DIAGNOSIS — R1314 Dysphagia, pharyngoesophageal phase: Secondary | ICD-10-CM | POA: Diagnosis not present

## 2014-06-27 DIAGNOSIS — B379 Candidiasis, unspecified: Secondary | ICD-10-CM | POA: Diagnosis present

## 2014-06-27 DIAGNOSIS — Z8546 Personal history of malignant neoplasm of prostate: Secondary | ICD-10-CM

## 2014-06-27 DIAGNOSIS — J690 Pneumonitis due to inhalation of food and vomit: Secondary | ICD-10-CM | POA: Diagnosis not present

## 2014-06-27 DIAGNOSIS — I440 Atrioventricular block, first degree: Secondary | ICD-10-CM | POA: Diagnosis present

## 2014-06-27 DIAGNOSIS — G8110 Spastic hemiplegia affecting unspecified side: Secondary | ICD-10-CM | POA: Diagnosis not present

## 2014-06-27 DIAGNOSIS — Z7982 Long term (current) use of aspirin: Secondary | ICD-10-CM

## 2014-06-27 DIAGNOSIS — D649 Anemia, unspecified: Secondary | ICD-10-CM | POA: Diagnosis present

## 2014-06-27 DIAGNOSIS — N289 Disorder of kidney and ureter, unspecified: Secondary | ICD-10-CM | POA: Diagnosis present

## 2014-06-27 DIAGNOSIS — R29810 Facial weakness: Secondary | ICD-10-CM | POA: Diagnosis present

## 2014-06-27 DIAGNOSIS — I6789 Other cerebrovascular disease: Secondary | ICD-10-CM | POA: Diagnosis not present

## 2014-06-27 DIAGNOSIS — M6281 Muscle weakness (generalized): Secondary | ICD-10-CM | POA: Diagnosis not present

## 2014-06-27 DIAGNOSIS — R918 Other nonspecific abnormal finding of lung field: Secondary | ICD-10-CM | POA: Diagnosis not present

## 2014-06-27 HISTORY — DX: Malignant (primary) neoplasm, unspecified: C80.1

## 2014-06-27 LAB — I-STAT CHEM 8, ED
BUN: 29 mg/dL — ABNORMAL HIGH (ref 6–23)
Calcium, Ion: 1.17 mmol/L (ref 1.13–1.30)
Chloride: 97 mEq/L (ref 96–112)
Creatinine, Ser: 0.9 mg/dL (ref 0.50–1.35)
Glucose, Bld: 132 mg/dL — ABNORMAL HIGH (ref 70–99)
HCT: 40 % (ref 39.0–52.0)
Hemoglobin: 13.6 g/dL (ref 13.0–17.0)
Potassium: 2.9 mEq/L — CL (ref 3.7–5.3)
Sodium: 132 mEq/L — ABNORMAL LOW (ref 137–147)
TCO2: 22 mmol/L (ref 0–100)

## 2014-06-27 LAB — CBC
HCT: 36.3 % — ABNORMAL LOW (ref 39.0–52.0)
Hemoglobin: 12.1 g/dL — ABNORMAL LOW (ref 13.0–17.0)
MCH: 28.5 pg (ref 26.0–34.0)
MCHC: 33.3 g/dL (ref 30.0–36.0)
MCV: 85.4 fL (ref 78.0–100.0)
Platelets: 381 10*3/uL (ref 150–400)
RBC: 4.25 MIL/uL (ref 4.22–5.81)
RDW: 12.9 % (ref 11.5–15.5)
WBC: 18.8 10*3/uL — AB (ref 4.0–10.5)

## 2014-06-27 LAB — DIFFERENTIAL
BASOS ABS: 0.1 10*3/uL (ref 0.0–0.1)
Basophils Relative: 0 % (ref 0–1)
Eosinophils Absolute: 0.1 10*3/uL (ref 0.0–0.7)
Eosinophils Relative: 0 % (ref 0–5)
LYMPHS ABS: 1 10*3/uL (ref 0.7–4.0)
Lymphocytes Relative: 6 % — ABNORMAL LOW (ref 12–46)
Monocytes Absolute: 1.8 10*3/uL — ABNORMAL HIGH (ref 0.1–1.0)
Monocytes Relative: 9 % (ref 3–12)
NEUTROS ABS: 15.9 10*3/uL — AB (ref 1.7–7.7)
Neutrophils Relative %: 85 % — ABNORMAL HIGH (ref 43–77)

## 2014-06-27 LAB — PROTIME-INR
INR: 1.15 (ref 0.00–1.49)
PROTHROMBIN TIME: 14.8 s (ref 11.6–15.2)

## 2014-06-27 LAB — APTT: APTT: 38 s — AB (ref 24–37)

## 2014-06-27 LAB — I-STAT TROPONIN, ED: Troponin i, poc: 0 ng/mL (ref 0.00–0.08)

## 2014-06-27 LAB — CBG MONITORING, ED: Glucose-Capillary: 121 mg/dL — ABNORMAL HIGH (ref 70–99)

## 2014-06-27 NOTE — ED Notes (Signed)
Per EMS, Pt wife last saw pt normal at 1900. Shortly before calling EMS she found pt slumped on the couch with L facial droop and R sided paralysis.

## 2014-06-27 NOTE — ED Provider Notes (Signed)
CSN: [id number]     Arrival date & time 06/27/14  2335 History  This chart was scribed for Julianne Rice, MD by Delphia Grates, ED Scribe. This patient was seen in room A03C/A03C and the patient's care was started at 11:47 PM.     Chief Complaint  Patient presents with  . Code Stroke    The history is provided by medical records and the spouse. No language interpreter was used.     HPI Comments: [name] is a 80 y.o. male brought in by ambulance, who presents to the Emergency Department for stroke. Patient is oriented to person, but not time. There is associated right sided hemiparesis. Last seen normal: earlier today, EMS was called at approximately 2300. Per wife, patient is a resident at [facility] off medical staff. Patient has been coughing a lot, and wife gave him medication from her left over Z-pack. Patient had pneumonia on xray upon evaluation by PCP.  Per nursing note, the patient was last seen normal at 1900 by his wife. She found the patient slumped on the cough and left facial droop and right hemiparesis.   Past Medical History  Diagnosis Date  . Hypertension   . Cancer    Past Surgical History  Procedure Laterality Date  . Appendectomy    . Kidney surgery     Family History  Problem Relation Age of Onset  . CAD Father    History  Substance Use Topics  . Smoking status: Former Research scientist (life sciences)  . Smokeless tobacco: Not on file  . Alcohol Use: Yes     Comment: occasionally    Review of Systems  Unable to perform ROS: Acuity of condition      Allergies  Review of patient's allergies indicates no known allergies.  Home Medications   Prior to Admission medications   Not on File   BP 154/66 mmHg  Pulse 76  Temp(Src) 98.1 F (36.7 C) (Oral)  Resp 18  Ht 5\' 9"  (1.753 m)  Wt 150 lb (68.04 kg)  BMI 22.14 kg/m2  SpO2 98% Physical Exam  Constitutional: He is oriented to person, place, and time. He appears well-developed and  well-nourished. No distress.  HENT:  Head: Normocephalic and atraumatic.  Mouth/Throat: Oropharynx is clear and moist.  Eyes: EOM are normal. Pupils are equal, round, and reactive to light.  Neck: Normal range of motion. Neck supple.  Cardiovascular: Normal rate and regular rhythm.   Pulmonary/Chest: Effort normal and breath sounds normal. No respiratory distress. He has no wheezes. He has no rales. He exhibits no tenderness.  Abdominal: Soft. Bowel sounds are normal. He exhibits no distension and no mass. There is no tenderness. There is no rebound and no guarding.  Musculoskeletal: Normal range of motion. He exhibits no edema or tenderness.  Neurological: He is alert and oriented to person, place, and time.  0/5 motor in right upper and right lower extremities. 5/5 motor in left upper and lower extremities. Sensation intact. Right-sided facial droop. Expressive aphasia.  Skin: Skin is warm and dry. No rash noted. No erythema.  Psychiatric: He has a normal mood and affect. His behavior is normal.  Nursing note and vitals reviewed.   ED Course  Procedures (including critical care time)    Labs Review Labs Reviewed  APTT - Abnormal; Notable for the following:    aPTT 38 (*)    All other components within normal limits  CBC - Abnormal; Notable for the following:  WBC 18.8 (*)    Hemoglobin 12.1 (*)    HCT 36.3 (*)    All other components within normal limits  DIFFERENTIAL - Abnormal; Notable for the following:    Neutrophils Relative % 85 (*)    Neutro Abs 15.9 (*)    Lymphocytes Relative 6 (*)    Monocytes Absolute 1.8 (*)    All other components within normal limits  COMPREHENSIVE METABOLIC PANEL - Abnormal; Notable for the following:    Sodium 131 (*)    Potassium 3.2 (*)    Chloride 91 (*)    Glucose, Bld 146 (*)    BUN 31 (*)    Albumin 2.7 (*)    Alkaline Phosphatase 155 (*)    GFR calc non Af Amer 74 (*)    GFR calc Af Amer 85 (*)    Anion gap 17 (*)    All  other components within normal limits  GLUCOSE, CAPILLARY - Abnormal; Notable for the following:    Glucose-Capillary 123 (*)    All other components within normal limits  CBG MONITORING, ED - Abnormal; Notable for the following:    Glucose-Capillary 121 (*)    All other components within normal limits  I-STAT CHEM 8, ED - Abnormal; Notable for the following:    Sodium 132 (*)    Potassium 2.9 (*)    BUN 29 (*)    Glucose, Bld 132 (*)    All other components within normal limits  PROTIME-INR  COMPREHENSIVE METABOLIC PANEL  CBC WITH DIFFERENTIAL  TSH  INFLUENZA PANEL BY PCR (TYPE A & B, H1N1)  MAGNESIUM  HEMOGLOBIN A1C  LIPID PANEL  CBC  CREATININE, SERUM  I-STAT TROPOININ, ED    Imaging Review Ct Angio Head W/cm &/or Wo Cm  06/28/2014   CLINICAL DATA:  Nursing home patient, last seen at baseline earlier today, new onset RIGHT-sided hemi paresis. Recent diagnosis of pneumonia.  EXAM: CT ANGIOGRAPHY HEAD AND NECK  TECHNIQUE: Multidetector CT imaging of the head and neck was performed using the standard protocol during bolus administration of intravenous contrast. Multiplanar CT image reconstructions and MIPs were obtained to evaluate the vascular anatomy. Carotid stenosis measurements (when applicable) are obtained utilizing NASCET criteria, using the distal internal carotid diameter as the denominator.  CONTRAST:  169mL OMNIPAQUE IOHEXOL 350 MG/ML SOLN  COMPARISON:  CT of the head June 27, 2014 at  FINDINGS: CTA HEAD FINDINGS  Moderately motion degraded evaluation at the skullbase and, circle of Willis. Anterior circulation: Normal appearance of the cervical internal carotid arteries, petrous, cavernous and supra clinoid internal carotid arteries. Widely patent anterior communicating artery. Normal appearance of the anterior and middle cerebral arteries.  Posterior circulation: LEFT vertebral artery is dominant with normal appearance of the vertebral arteries, vertebrobasilar junction  and basilar artery. Due to motion, limited assessment of the basilar artery and branch vessels. Normal appearance of the posterior cerebral arteries.  Mild luminal irregularity of the mid to distal intracranial arteries most consistent with atherosclerosis No large vessel occlusion, hemodynamically significant stenosis, dissection, contrast extravasation or aneurysm within the anterior nor posterior circulation.  No abnormal intraparenchymal or extra-axial enhancement.  Review of the MIP images confirms the above findings.  CTA NECK FINDINGS  Normal appearance of the thoracic arch, normal branch pattern. Mild calcific atherosclerosis of the aortic arch. The origins of the innominate, left Common carotid artery and subclavian artery are widely patent. Streak artifact from retained LEFT subclavian venous contrast results in spurious hypodensity LEFT Common  carotid artery proximally.  Bilateral Common carotid arteries are widely patent, coursing in a straight line fashion. Normal appearance of the carotid bifurcations without hemodynamically significant stenosis by NASCET criteria. Normal appearance of the included internal carotid arteries.  Left vertebral artery is dominant. Calcific atherosclerosis of the LEFT vertebral artery origin without hemodynamically significant stenosis. Tortuous LEFT vertebral artery V1 segment, with focal luminal irregularity and narrowing, coronal 74/239 and, slight poststenotic dilatation. Mild irregularity of the LEFT V2 segment, coronal 70/239.  No hemodynamically significant stenosis by NASCET criteria. No dissection, no pseudoaneurysm. No abnormal luminal irregularity. No contrast extravasation.  Soft tissues are nonsuspicious. No acute osseous process though bone windows have not been submitted. Included view of the chest demonstrate centrilobular emphysema and calcified apical pleural plaques with fibronodular scarring.  Review of the MIP images confirms the above findings.   IMPRESSION: CTA HEAD: Moderately motion degraded examination limits evaluation without large vessel occlusion. Mild luminal irregularity in a pattern suggesting atherosclerosis.  CTA NECK: Calcific atherosclerosis of the carotid bulbs without hemodynamically significant stenosis by NASCET criteria.  LEFT V1 and V2 intimal irregularity suggesting remote dissection without convincing evidence of acute vascular injury nor flow-limiting stenosis.   Electronically Signed   By: Elon Alas   On: 06/28/2014 01:02   Dg Chest 2 View  06/27/2014   CLINICAL DATA:  Productive cough.  EXAM: CHEST  2 VIEW  COMPARISON:  08/11/2011.  FINDINGS: Mediastinum and hilar structures normal. Right lower in the low lobe infiltrate consistent with pneumonia. Left lung is clear. Biapical pleural parenchymal thickening consistent scarring. Heart size normal. No acute bony abnormality. Degenerative changes thoracic spine.  IMPRESSION: Right lower lobe and right middle lobe infiltrates consistent with pneumonia.   Electronically Signed   By: Marcello Moores  Register   On: 06/27/2014 12:47   Ct Head (brain) Wo Contrast  06/27/2014   CLINICAL DATA:  80 year old male with acute right arm weakness and facial droop. Code stroke. Initial encounter.  EXAM: CT HEAD WITHOUT CONTRAST  TECHNIQUE: Contiguous axial images were obtained from the base of the skull through the vertex without intravenous contrast.  COMPARISON:  None.  FINDINGS: Mild cerebral atrophy and moderate -severe chronic appearing small-vessel white matter ischemic changes are identified.  A right basal ganglia lacunar infarct appears remote.  No acute intracranial abnormalities are identified, including mass lesion or mass effect, hydrocephalus, extra-axial fluid collection, midline shift, hemorrhage, or acute infarction.  The visualized bony calvarium is unremarkable.  Right maxillary sinus mucosal thickening is noted.  IMPRESSION: No evidence of acute intracranial abnormality.   Atrophy, chronic small-vessel white matter ischemic changes and remote appearing right basal ganglia lacunar infarct.  Critical Value/emergent results were called by telephone at the time of interpretation on 06/27/2014 at 11:56 pm to Dr. Julianne Rice , who verbally acknowledged these results.   Electronically Signed   By: Hassan Rowan M.D.   On: 06/27/2014 23:57   Ct Angio Neck W/cm &/or Wo/cm  06/28/2014   CLINICAL DATA:  Nursing home patient, last seen at baseline earlier today, new onset RIGHT-sided hemi paresis. Recent diagnosis of pneumonia.  EXAM: CT ANGIOGRAPHY HEAD AND NECK  TECHNIQUE: Multidetector CT imaging of the head and neck was performed using the standard protocol during bolus administration of intravenous contrast. Multiplanar CT image reconstructions and MIPs were obtained to evaluate the vascular anatomy. Carotid stenosis measurements (when applicable) are obtained utilizing NASCET criteria, using the distal internal carotid diameter as the denominator.  CONTRAST:  117mL OMNIPAQUE IOHEXOL 350  MG/ML SOLN  COMPARISON:  CT of the head June 27, 2014 at  FINDINGS: CTA HEAD FINDINGS  Moderately motion degraded evaluation at the skullbase and, circle of Willis. Anterior circulation: Normal appearance of the cervical internal carotid arteries, petrous, cavernous and supra clinoid internal carotid arteries. Widely patent anterior communicating artery. Normal appearance of the anterior and middle cerebral arteries.  Posterior circulation: LEFT vertebral artery is dominant with normal appearance of the vertebral arteries, vertebrobasilar junction and basilar artery. Due to motion, limited assessment of the basilar artery and branch vessels. Normal appearance of the posterior cerebral arteries.  Mild luminal irregularity of the mid to distal intracranial arteries most consistent with atherosclerosis No large vessel occlusion, hemodynamically significant stenosis, dissection, contrast extravasation or  aneurysm within the anterior nor posterior circulation.  No abnormal intraparenchymal or extra-axial enhancement.  Review of the MIP images confirms the above findings.  CTA NECK FINDINGS  Normal appearance of the thoracic arch, normal branch pattern. Mild calcific atherosclerosis of the aortic arch. The origins of the innominate, left Common carotid artery and subclavian artery are widely patent. Streak artifact from retained LEFT subclavian venous contrast results in spurious hypodensity LEFT Common carotid artery proximally.  Bilateral Common carotid arteries are widely patent, coursing in a straight line fashion. Normal appearance of the carotid bifurcations without hemodynamically significant stenosis by NASCET criteria. Normal appearance of the included internal carotid arteries.  Left vertebral artery is dominant. Calcific atherosclerosis of the LEFT vertebral artery origin without hemodynamically significant stenosis. Tortuous LEFT vertebral artery V1 segment, with focal luminal irregularity and narrowing, coronal 74/239 and, slight poststenotic dilatation. Mild irregularity of the LEFT V2 segment, coronal 70/239.  No hemodynamically significant stenosis by NASCET criteria. No dissection, no pseudoaneurysm. No abnormal luminal irregularity. No contrast extravasation.  Soft tissues are nonsuspicious. No acute osseous process though bone windows have not been submitted. Included view of the chest demonstrate centrilobular emphysema and calcified apical pleural plaques with fibronodular scarring.  Review of the MIP images confirms the above findings.  IMPRESSION: CTA HEAD: Moderately motion degraded examination limits evaluation without large vessel occlusion. Mild luminal irregularity in a pattern suggesting atherosclerosis.  CTA NECK: Calcific atherosclerosis of the carotid bulbs without hemodynamically significant stenosis by NASCET criteria.  LEFT V1 and V2 intimal irregularity suggesting remote dissection  without convincing evidence of acute vascular injury nor flow-limiting stenosis.   Electronically Signed   By: Elon Alas   On: 06/28/2014 01:02   Dg Chest Port 1 View  06/28/2014   CLINICAL DATA:  Acute onset of weakness.  Initial encounter.  EXAM: PORTABLE CHEST - 1 VIEW  COMPARISON:  Chest radiograph performed 06/27/2014  FINDINGS: The lungs are well-aerated. Worsening right basilar airspace opacification is compatible with pneumonia. There is no evidence of pleural effusion or pneumothorax.  The cardiomediastinal silhouette is within normal limits. No acute osseous abnormalities are seen.  IMPRESSION: Mildly worsening right basilar pneumonia noted.   Electronically Signed   By: Garald Balding M.D.   On: 06/28/2014 01:38     EKG Interpretation None      MDM   Final diagnoses:  Cough  Stroke  CAP (community acquired pneumonia)    I personally performed the services described in this documentation, which was scribed in my presence. The recorded information has been reviewed and is accurate.  You've an unknown onset of symptoms, neurology does not believe that TPA is indicated. Patient with right middle and lower lobe pneumonia on chest x-ray. No recent hospitalizations. IV  antibiotics started in the emergency department. Discuss with hospitalist and we'll admit to telemetry bed.   Julianne Rice, MD 06/28/14 [phone]

## 2014-06-28 ENCOUNTER — Emergency Department (HOSPITAL_COMMUNITY): Payer: Medicare Other

## 2014-06-28 ENCOUNTER — Encounter (HOSPITAL_COMMUNITY): Payer: Self-pay | Admitting: Radiology

## 2014-06-28 ENCOUNTER — Inpatient Hospital Stay (HOSPITAL_COMMUNITY): Payer: Medicare Other

## 2014-06-28 DIAGNOSIS — G8191 Hemiplegia, unspecified affecting right dominant side: Secondary | ICD-10-CM | POA: Diagnosis not present

## 2014-06-28 DIAGNOSIS — J189 Pneumonia, unspecified organism: Secondary | ICD-10-CM | POA: Diagnosis not present

## 2014-06-28 DIAGNOSIS — G8110 Spastic hemiplegia affecting unspecified side: Secondary | ICD-10-CM | POA: Diagnosis not present

## 2014-06-28 DIAGNOSIS — J690 Pneumonitis due to inhalation of food and vomit: Secondary | ICD-10-CM | POA: Diagnosis not present

## 2014-06-28 DIAGNOSIS — I10 Essential (primary) hypertension: Secondary | ICD-10-CM | POA: Diagnosis present

## 2014-06-28 DIAGNOSIS — D649 Anemia, unspecified: Secondary | ICD-10-CM | POA: Diagnosis not present

## 2014-06-28 DIAGNOSIS — Z87891 Personal history of nicotine dependence: Secondary | ICD-10-CM | POA: Diagnosis not present

## 2014-06-28 DIAGNOSIS — E860 Dehydration: Secondary | ICD-10-CM | POA: Diagnosis present

## 2014-06-28 DIAGNOSIS — R471 Dysarthria and anarthria: Secondary | ICD-10-CM | POA: Diagnosis present

## 2014-06-28 DIAGNOSIS — I440 Atrioventricular block, first degree: Secondary | ICD-10-CM | POA: Diagnosis present

## 2014-06-28 DIAGNOSIS — I639 Cerebral infarction, unspecified: Secondary | ICD-10-CM

## 2014-06-28 DIAGNOSIS — I739 Peripheral vascular disease, unspecified: Secondary | ICD-10-CM | POA: Diagnosis present

## 2014-06-28 DIAGNOSIS — R0602 Shortness of breath: Secondary | ICD-10-CM | POA: Diagnosis not present

## 2014-06-28 DIAGNOSIS — I6523 Occlusion and stenosis of bilateral carotid arteries: Secondary | ICD-10-CM | POA: Diagnosis not present

## 2014-06-28 DIAGNOSIS — N289 Disorder of kidney and ureter, unspecified: Secondary | ICD-10-CM | POA: Diagnosis present

## 2014-06-28 DIAGNOSIS — E876 Hypokalemia: Secondary | ICD-10-CM | POA: Diagnosis present

## 2014-06-28 DIAGNOSIS — E785 Hyperlipidemia, unspecified: Secondary | ICD-10-CM | POA: Diagnosis present

## 2014-06-28 DIAGNOSIS — R531 Weakness: Secondary | ICD-10-CM | POA: Diagnosis not present

## 2014-06-28 DIAGNOSIS — I63132 Cerebral infarction due to embolism of left carotid artery: Secondary | ICD-10-CM | POA: Diagnosis not present

## 2014-06-28 DIAGNOSIS — R1312 Dysphagia, oropharyngeal phase: Secondary | ICD-10-CM | POA: Diagnosis present

## 2014-06-28 DIAGNOSIS — B379 Candidiasis, unspecified: Secondary | ICD-10-CM | POA: Diagnosis present

## 2014-06-28 DIAGNOSIS — I4891 Unspecified atrial fibrillation: Secondary | ICD-10-CM | POA: Diagnosis not present

## 2014-06-28 DIAGNOSIS — R29810 Facial weakness: Secondary | ICD-10-CM | POA: Diagnosis present

## 2014-06-28 DIAGNOSIS — Z8546 Personal history of malignant neoplasm of prostate: Secondary | ICD-10-CM | POA: Diagnosis not present

## 2014-06-28 DIAGNOSIS — R1314 Dysphagia, pharyngoesophageal phase: Secondary | ICD-10-CM | POA: Diagnosis not present

## 2014-06-28 DIAGNOSIS — R05 Cough: Secondary | ICD-10-CM | POA: Diagnosis present

## 2014-06-28 DIAGNOSIS — Z7982 Long term (current) use of aspirin: Secondary | ICD-10-CM | POA: Diagnosis not present

## 2014-06-28 DIAGNOSIS — E119 Type 2 diabetes mellitus without complications: Secondary | ICD-10-CM | POA: Diagnosis present

## 2014-06-28 LAB — LIPID PANEL
Cholesterol: 139 mg/dL (ref 0–200)
HDL: 23 mg/dL — ABNORMAL LOW (ref >39)
LDL CALC: 94 mg/dL (ref 0–99)
Total CHOL/HDL Ratio: 6 RATIO
Triglycerides: 108 mg/dL (ref <150)
VLDL: 22 mg/dL (ref 0–40)

## 2014-06-28 LAB — GLUCOSE, CAPILLARY
GLUCOSE-CAPILLARY: 98 mg/dL (ref 70–99)
Glucose-Capillary: 123 mg/dL — ABNORMAL HIGH (ref 70–99)
Glucose-Capillary: 82 mg/dL (ref 70–99)
Glucose-Capillary: 87 mg/dL (ref 70–99)
Glucose-Capillary: 88 mg/dL (ref 70–99)

## 2014-06-28 LAB — CBC WITH DIFFERENTIAL/PLATELET
Basophils Absolute: 0 10*3/uL (ref 0.0–0.1)
Basophils Relative: 0 % (ref 0–1)
EOS ABS: 0 10*3/uL (ref 0.0–0.7)
Eosinophils Relative: 0 % (ref 0–5)
HCT: 35.8 % — ABNORMAL LOW (ref 39.0–52.0)
Hemoglobin: 11.8 g/dL — ABNORMAL LOW (ref 13.0–17.0)
LYMPHS ABS: 0.9 10*3/uL (ref 0.7–4.0)
Lymphocytes Relative: 5 % — ABNORMAL LOW (ref 12–46)
MCH: 28.6 pg (ref 26.0–34.0)
MCHC: 33 g/dL (ref 30.0–36.0)
MCV: 86.7 fL (ref 78.0–100.0)
Monocytes Absolute: 1.6 10*3/uL — ABNORMAL HIGH (ref 0.1–1.0)
Monocytes Relative: 9 % (ref 3–12)
NEUTROS ABS: 15 10*3/uL — AB (ref 1.7–7.7)
Neutrophils Relative %: 86 % — ABNORMAL HIGH (ref 43–77)
PLATELETS: 376 10*3/uL (ref 150–400)
RBC: 4.13 MIL/uL — AB (ref 4.22–5.81)
RDW: 12.9 % (ref 11.5–15.5)
WBC: 17.6 10*3/uL — AB (ref 4.0–10.5)

## 2014-06-28 LAB — COMPREHENSIVE METABOLIC PANEL
ALK PHOS: 155 U/L — AB (ref 39–117)
ALT: 28 U/L (ref 0–53)
ALT: 32 U/L (ref 0–53)
ANION GAP: 20 — AB (ref 5–15)
AST: 18 U/L (ref 0–37)
AST: 21 U/L (ref 0–37)
Albumin: 2.5 g/dL — ABNORMAL LOW (ref 3.5–5.2)
Albumin: 2.7 g/dL — ABNORMAL LOW (ref 3.5–5.2)
Alkaline Phosphatase: 149 U/L — ABNORMAL HIGH (ref 39–117)
Anion gap: 17 — ABNORMAL HIGH (ref 5–15)
BUN: 27 mg/dL — ABNORMAL HIGH (ref 6–23)
BUN: 31 mg/dL — ABNORMAL HIGH (ref 6–23)
CALCIUM: 9.2 mg/dL (ref 8.4–10.5)
CO2: 22 mEq/L (ref 19–32)
CO2: 23 mEq/L (ref 19–32)
CREATININE: 0.8 mg/dL (ref 0.50–1.35)
Calcium: 9.6 mg/dL (ref 8.4–10.5)
Chloride: 91 mEq/L — ABNORMAL LOW (ref 96–112)
Chloride: 92 mEq/L — ABNORMAL LOW (ref 96–112)
Creatinine, Ser: 0.94 mg/dL (ref 0.50–1.35)
GFR, EST AFRICAN AMERICAN: 85 mL/min — AB (ref >90)
GFR, EST NON AFRICAN AMERICAN: 74 mL/min — AB (ref >90)
GFR, EST NON AFRICAN AMERICAN: 79 mL/min — AB (ref >90)
GLUCOSE: 146 mg/dL — AB (ref 70–99)
GLUCOSE: 78 mg/dL (ref 70–99)
POTASSIUM: 3.2 meq/L — AB (ref 3.7–5.3)
Potassium: 2.9 mEq/L — CL (ref 3.7–5.3)
Sodium: 131 mEq/L — ABNORMAL LOW (ref 137–147)
Sodium: 134 mEq/L — ABNORMAL LOW (ref 137–147)
Total Bilirubin: 0.5 mg/dL (ref 0.3–1.2)
Total Bilirubin: 0.6 mg/dL (ref 0.3–1.2)
Total Protein: 6.5 g/dL (ref 6.0–8.3)
Total Protein: 7.1 g/dL (ref 6.0–8.3)

## 2014-06-28 LAB — BASIC METABOLIC PANEL
Anion gap: 16 — ABNORMAL HIGH (ref 5–15)
BUN: 23 mg/dL (ref 6–23)
CO2: 22 mEq/L (ref 19–32)
Calcium: 9.1 mg/dL (ref 8.4–10.5)
Chloride: 96 mEq/L (ref 96–112)
Creatinine, Ser: 0.82 mg/dL (ref 0.50–1.35)
GFR calc non Af Amer: 78 mL/min — ABNORMAL LOW (ref >90)
Glucose, Bld: 91 mg/dL (ref 70–99)
POTASSIUM: 3.6 meq/L — AB (ref 3.7–5.3)
SODIUM: 134 meq/L — AB (ref 137–147)

## 2014-06-28 LAB — TSH: TSH: 0.873 u[IU]/mL (ref 0.350–4.500)

## 2014-06-28 LAB — MAGNESIUM: MAGNESIUM: 2.3 mg/dL (ref 1.5–2.5)

## 2014-06-28 LAB — HEMOGLOBIN A1C
Hgb A1c MFr Bld: 5.9 % — ABNORMAL HIGH (ref <5.7)
Mean Plasma Glucose: 123 mg/dL — ABNORMAL HIGH (ref <117)

## 2014-06-28 MED ORDER — POTASSIUM CHLORIDE 10 MEQ/100ML IV SOLN
10.0000 meq | INTRAVENOUS | Status: AC
  Administered 2014-06-28 (×4): 10 meq via INTRAVENOUS
  Filled 2014-06-28 (×3): qty 100

## 2014-06-28 MED ORDER — STROKE: EARLY STAGES OF RECOVERY BOOK
Freq: Once | Status: AC
  Administered 2014-06-28: 04:00:00
  Filled 2014-06-28: qty 1

## 2014-06-28 MED ORDER — LEVOFLOXACIN IN D5W 750 MG/150ML IV SOLN
750.0000 mg | Freq: Once | INTRAVENOUS | Status: AC
  Administered 2014-06-28: 750 mg via INTRAVENOUS
  Filled 2014-06-28: qty 150

## 2014-06-28 MED ORDER — ENOXAPARIN SODIUM 40 MG/0.4ML ~~LOC~~ SOLN
40.0000 mg | Freq: Every day | SUBCUTANEOUS | Status: DC
  Administered 2014-06-28 – 2014-07-02 (×5): 40 mg via SUBCUTANEOUS
  Filled 2014-06-28 (×5): qty 0.4

## 2014-06-28 MED ORDER — LEVOFLOXACIN IN D5W 750 MG/150ML IV SOLN
750.0000 mg | Freq: Every day | INTRAVENOUS | Status: DC
  Administered 2014-06-29 – 2014-06-30 (×2): 750 mg via INTRAVENOUS
  Filled 2014-06-28 (×2): qty 150

## 2014-06-28 MED ORDER — IOHEXOL 350 MG/ML SOLN
100.0000 mL | Freq: Once | INTRAVENOUS | Status: AC | PRN
  Administered 2014-06-28: 100 mL via INTRAVENOUS

## 2014-06-28 MED ORDER — ASPIRIN 325 MG PO TABS
325.0000 mg | ORAL_TABLET | Freq: Every day | ORAL | Status: DC
  Administered 2014-06-30 – 2014-07-02 (×3): 325 mg via ORAL
  Filled 2014-06-28 (×4): qty 1

## 2014-06-28 MED ORDER — POTASSIUM CHLORIDE IN NACL 20-0.9 MEQ/L-% IV SOLN
INTRAVENOUS | Status: DC
  Administered 2014-06-28 – 2014-06-30 (×3): via INTRAVENOUS
  Administered 2014-07-01: 1000 mL via INTRAVENOUS
  Filled 2014-06-28 (×5): qty 1000

## 2014-06-28 MED ORDER — SODIUM CHLORIDE 0.9 % IV SOLN
INTRAVENOUS | Status: DC
  Administered 2014-06-28: 1000 mL via INTRAVENOUS

## 2014-06-28 MED ORDER — SENNOSIDES-DOCUSATE SODIUM 8.6-50 MG PO TABS: 1.0000 | ORAL_TABLET | Freq: Every evening | ORAL | Status: DC | PRN

## 2014-06-28 MED ORDER — ASPIRIN 300 MG RE SUPP
300.0000 mg | Freq: Every day | RECTAL | Status: DC
  Administered 2014-06-28 – 2014-06-29 (×2): 300 mg via RECTAL
  Filled 2014-06-28 (×4): qty 1

## 2014-06-28 NOTE — Evaluation (Signed)
Clinical/Bedside Swallow Evaluation Patient Details  Name: [name] MRN: [id number] Date of Birth: 1925-08-27  Today's Date: 06/28/2014 Time: 3086-5784 SLP Time Calculation (min) (ACUTE ONLY): 20 min  Past Medical History:  Past Medical History  Diagnosis Date  . Hypertension   . Cancer    Past Surgical History:  Past Surgical History  Procedure Laterality Date  . Appendectomy    . Kidney surgery     HPI:  [name] is a 80 y.o. male with history of hypertension and prostate cancer. He had a productive cough for few days prior to admission. Patient had gone to his PCP on day of admission and was prescribed antibiotics for possible pneumonia. Later patient's family called EMS as they found that patient was having right facial droop with right-sided hemiplegia and was brought to the ER. MRI shows an acute nonhemorrhagic left paramedian pontine infarct.   Assessment / Plan / Recommendation Clinical Impression  Pt's lethargy, current mentation, and right-sided weakness impact his abiltiy to safely swallow at this time. He appears to have lingual pumping leading to delayed swallow initiation with thin liquids, suspect due to difficulty to initiate swallow sequence. This results in immediate and delayed coughing. Pt has increased time between oral clearance and swallow initiation with solid boluses, ultimately requiring Max cues and intervention from SLP in order to trigger a swallow response. SLP suctioned small amounts of puree our of pt's right buccal cavity. Recommend to maintain NPO at this time with reassessment on next date for readiness to participate in [place].     Aspiration Risk  Severe    Diet Recommendation NPO   Medication Administration: Via alternative means    Other  Recommendations Recommended Consults: MBS Oral Care Recommendations: Oral care Q4 per protocol   Follow Up Recommendations  Inpatient Rehab;24 hour supervision/assistance    Frequency and  Duration min 3x week  1 week   Pertinent Vitals/Pain n/a    SLP Swallow Goals     Swallow Study Prior Functional Status       General Date of Onset: 06/27/14 HPI: [name] is a 80 y.o. male with history of hypertension and prostate cancer. He had a productive cough for few days prior to admission. Patient had gone to his PCP on day of admission and was prescribed antibiotics for possible pneumonia. Later patient's family called EMS as they found that patient was having right facial droop with right-sided hemiplegia and was brought to the ER. MRI shows an acute nonhemorrhagic left paramedian pontine infarct. Type of Study: Bedside swallow evaluation Previous Swallow Assessment: none in chart Diet Prior to this Study: NPO Temperature Spikes Noted: No Respiratory Status: Nasal cannula History of Recent Intubation: No Behavior/Cognition: Lethargic;Cooperative;Requires cueing Oral Cavity - Dentition: Missing dentition (missing some teeth, primarily some molars) Self-Feeding Abilities: Needs assist Patient Positioning: Upright in bed Baseline Vocal Quality: Low vocal intensity Volitional Cough: Strong (with cues for effort) Volitional Swallow: Unable to elicit    Oral/Motor/Sensory Function Overall Oral Motor/Sensory Function: Impaired Labial ROM: Reduced right Labial Symmetry: Abnormal symmetry right Labial Strength: Reduced Labial Sensation: Reduced Lingual ROM: Within Functional Limits Lingual Symmetry: Within Functional Limits Lingual Strength: Reduced Facial ROM: Reduced right Facial Symmetry: Right droop Facial Strength: Reduced Velum: Within Functional Limits   Ice Chips     Thin Liquid Thin Liquid: Impaired Presentation: Cup;Self Fed;Straw Oral Phase Impairments: Reduced labial seal;Reduced lingual movement/coordination;Poor awareness of bolus Oral Phase Functional Implications: Right anterior spillage;Other (comment);Prolonged oral transit (appears to  have  pumping) Pharyngeal  Phase Impairments: Suspected delayed Swallow;Multiple swallows;Cough - Immediate;Cough - Delayed    Nectar Thick Nectar Thick Liquid: Not tested   Honey Thick Honey Thick Liquid: Not tested   Puree Puree: Impaired Presentation: Spoon Oral Phase Impairments: Reduced labial seal;Reduced lingual movement/coordination;Impaired anterior to posterior transit;Poor awareness of bolus Oral Phase Functional Implications: Right anterior spillage;Right lateral sulci pocketing Pharyngeal Phase Impairments: Suspected delayed Swallow;Throat Clearing - Immediate   Solid   GO    Solid: Not tested        Germain Osgood, M.A. CCC-SLP [phone]  Germain Osgood 06/28/2014,3:02 PM

## 2014-06-28 NOTE — Progress Notes (Signed)
[facility] PROGRESS NOTE  [name] EPP:[id number] DOB: Aug 28, 1925 DOA: 06/27/2014  PCP: Unknown  Brief HPI: [name] is a 80 y.o. male with history of hypertension and prostate cancer. He had a productive cough for few days prior to admission. Patient had gone to his PCP on day of admission and was prescribed antibiotics for possible pneumonia. Later patient's family called EMS as they found that patient was having right facial droop with right-sided hemiplegia and was brought to the ER.   Past medical history:  Past Medical History  Diagnosis Date  . Hypertension   . Cancer     Consultants: Neurology  Procedures:  2D ECHO Pending  Carotid Doppler Preliminary report: 1-39% ICA stenosis. Vertebral artery flow is antegrade.   Antibiotics: Levaquin 12/3-->  Subjective: Patient with significant dysarthria. Wife at bedside. Unable to move right side of his body.  Objective: Vital Signs  Filed Vitals:   06/28/14 0223 06/28/14 0400 06/28/14 0559 06/28/14 1048  BP: 140/60 154/68 154/66 152/56  Pulse: 75 68 76   Temp: 97.5 F (36.4 C) 98.1 F (36.7 C) 98.1 F (36.7 C) 98.5 F (36.9 C)  TempSrc: Oral Oral Oral Oral  Resp: 18 18 18 18   Height: 5\' 9"  (1.753 m)     Weight: 68.04 kg (150 lb)     SpO2: 100% 97% 98% 99%    Intake/Output Summary (Last 24 hours) at 06/28/14 1115 Last data filed at 06/28/14 0700  Gross per 24 hour  Intake 773.33 ml  Output    425 ml  Net 348.33 ml   Filed Weights   06/28/14 0223  Weight: 68.04 kg (150 lb)    General appearance: alert, cooperative and no distress Resp: decreased air entry at bases. few crackles right base. Cardio: regular rate and rhythm, S1, S2 normal, no murmur, click, rub or gallop GI: soft, non-tender; bowel sounds normal; no masses,  no organomegaly Extremities: extremities normal, atraumatic, no cyanosis or edema Neurologic: Right hemiparesis. Right facial droop.  Lab Results:  Basic  Metabolic Panel:  Recent Labs Lab 06/27/14 2334 06/27/14 2349 06/28/14 0448  NA 131* 132* 134*  K 3.2* 2.9* 2.9*  CL 91* 97 92*  CO2 23  --  22  GLUCOSE 146* 132* 78  BUN 31* 29* 27*  CREATININE 0.94 0.90 0.80  CALCIUM 9.6  --  9.2  MG  --   --  2.3   Liver Function Tests:  Recent Labs Lab 06/27/14 2334 06/28/14 0448  AST 21 18  ALT 32 28  ALKPHOS 155* 149*  BILITOT 0.5 0.6  PROT 7.1 6.5  ALBUMIN 2.7* 2.5*   CBC:  Recent Labs Lab 06/27/14 2334 06/27/14 2349 06/28/14 0448  WBC 18.8*  --  17.6*  NEUTROABS 15.9*  --  15.0*  HGB 12.1* 13.6 11.8*  HCT 36.3* 40.0 35.8*  MCV 85.4  --  86.7  PLT 381  --  376   CBG:  Recent Labs Lab 06/27/14 2355 06/28/14 0352  GLUCAP 121* 123*     Studies/Results: Ct Angio Head W/cm &/or Wo Cm  06/28/2014   CLINICAL DATA:  Nursing home patient, last seen at baseline earlier today, new onset RIGHT-sided hemi paresis. Recent diagnosis of pneumonia.  EXAM: CT ANGIOGRAPHY HEAD AND NECK  TECHNIQUE: Multidetector CT imaging of the head and neck was performed using the standard protocol during bolus administration of intravenous contrast. Multiplanar CT image reconstructions and MIPs were obtained to evaluate the vascular anatomy. Carotid  stenosis measurements (when applicable) are obtained utilizing NASCET criteria, using the distal internal carotid diameter as the denominator.  CONTRAST:  161mL OMNIPAQUE IOHEXOL 350 MG/ML SOLN  COMPARISON:  CT of the head June 27, 2014 at  FINDINGS: CTA HEAD FINDINGS  Moderately motion degraded evaluation at the skullbase and, circle of Willis. Anterior circulation: Normal appearance of the cervical internal carotid arteries, petrous, cavernous and supra clinoid internal carotid arteries. Widely patent anterior communicating artery. Normal appearance of the anterior and middle cerebral arteries.  Posterior circulation: LEFT vertebral artery is dominant with normal appearance of the vertebral arteries,  vertebrobasilar junction and basilar artery. Due to motion, limited assessment of the basilar artery and branch vessels. Normal appearance of the posterior cerebral arteries.  Mild luminal irregularity of the mid to distal intracranial arteries most consistent with atherosclerosis No large vessel occlusion, hemodynamically significant stenosis, dissection, contrast extravasation or aneurysm within the anterior nor posterior circulation.  No abnormal intraparenchymal or extra-axial enhancement.  Review of the MIP images confirms the above findings.  CTA NECK FINDINGS  Normal appearance of the thoracic arch, normal branch pattern. Mild calcific atherosclerosis of the aortic arch. The origins of the innominate, left Common carotid artery and subclavian artery are widely patent. Streak artifact from retained LEFT subclavian venous contrast results in spurious hypodensity LEFT Common carotid artery proximally.  Bilateral Common carotid arteries are widely patent, coursing in a straight line fashion. Normal appearance of the carotid bifurcations without hemodynamically significant stenosis by NASCET criteria. Normal appearance of the included internal carotid arteries.  Left vertebral artery is dominant. Calcific atherosclerosis of the LEFT vertebral artery origin without hemodynamically significant stenosis. Tortuous LEFT vertebral artery V1 segment, with focal luminal irregularity and narrowing, coronal 74/239 and, slight poststenotic dilatation. Mild irregularity of the LEFT V2 segment, coronal 70/239.  No hemodynamically significant stenosis by NASCET criteria. No dissection, no pseudoaneurysm. No abnormal luminal irregularity. No contrast extravasation.  Soft tissues are nonsuspicious. No acute osseous process though bone windows have not been submitted. Included view of the chest demonstrate centrilobular emphysema and calcified apical pleural plaques with fibronodular scarring.  Review of the MIP images confirms  the above findings.  IMPRESSION: CTA HEAD: Moderately motion degraded examination limits evaluation without large vessel occlusion. Mild luminal irregularity in a pattern suggesting atherosclerosis.  CTA NECK: Calcific atherosclerosis of the carotid bulbs without hemodynamically significant stenosis by NASCET criteria.  LEFT V1 and V2 intimal irregularity suggesting remote dissection without convincing evidence of acute vascular injury nor flow-limiting stenosis.   Electronically Signed   By: Elon Alas   On: 06/28/2014 01:02   Dg Chest 2 View  06/27/2014   CLINICAL DATA:  Productive cough.  EXAM: CHEST  2 VIEW  COMPARISON:  08/11/2011.  FINDINGS: Mediastinum and hilar structures normal. Right lower in the low lobe infiltrate consistent with pneumonia. Left lung is clear. Biapical pleural parenchymal thickening consistent scarring. Heart size normal. No acute bony abnormality. Degenerative changes thoracic spine.  IMPRESSION: Right lower lobe and right middle lobe infiltrates consistent with pneumonia.   Electronically Signed   By: Marcello Moores  Register   On: 06/27/2014 12:47   Ct Head (brain) Wo Contrast  06/27/2014   CLINICAL DATA:  80 year old male with acute right arm weakness and facial droop. Code stroke. Initial encounter.  EXAM: CT HEAD WITHOUT CONTRAST  TECHNIQUE: Contiguous axial images were obtained from the base of the skull through the vertex without intravenous contrast.  COMPARISON:  None.  FINDINGS: Mild cerebral atrophy and moderate -  severe chronic appearing small-vessel white matter ischemic changes are identified.  A right basal ganglia lacunar infarct appears remote.  No acute intracranial abnormalities are identified, including mass lesion or mass effect, hydrocephalus, extra-axial fluid collection, midline shift, hemorrhage, or acute infarction.  The visualized bony calvarium is unremarkable.  Right maxillary sinus mucosal thickening is noted.  IMPRESSION: No evidence of acute  intracranial abnormality.  Atrophy, chronic small-vessel white matter ischemic changes and remote appearing right basal ganglia lacunar infarct.  Critical Value/emergent results were called by telephone at the time of interpretation on 06/27/2014 at 11:56 pm to Dr. Julianne Rice , who verbally acknowledged these results.   Electronically Signed   By: Hassan Rowan M.D.   On: 06/27/2014 23:57   Ct Angio Neck W/cm &/or Wo/cm  06/28/2014   CLINICAL DATA:  Nursing home patient, last seen at baseline earlier today, new onset RIGHT-sided hemi paresis. Recent diagnosis of pneumonia.  EXAM: CT ANGIOGRAPHY HEAD AND NECK  TECHNIQUE: Multidetector CT imaging of the head and neck was performed using the standard protocol during bolus administration of intravenous contrast. Multiplanar CT image reconstructions and MIPs were obtained to evaluate the vascular anatomy. Carotid stenosis measurements (when applicable) are obtained utilizing NASCET criteria, using the distal internal carotid diameter as the denominator.  CONTRAST:  152mL OMNIPAQUE IOHEXOL 350 MG/ML SOLN  COMPARISON:  CT of the head June 27, 2014 at  FINDINGS: CTA HEAD FINDINGS  Moderately motion degraded evaluation at the skullbase and, circle of Willis. Anterior circulation: Normal appearance of the cervical internal carotid arteries, petrous, cavernous and supra clinoid internal carotid arteries. Widely patent anterior communicating artery. Normal appearance of the anterior and middle cerebral arteries.  Posterior circulation: LEFT vertebral artery is dominant with normal appearance of the vertebral arteries, vertebrobasilar junction and basilar artery. Due to motion, limited assessment of the basilar artery and branch vessels. Normal appearance of the posterior cerebral arteries.  Mild luminal irregularity of the mid to distal intracranial arteries most consistent with atherosclerosis No large vessel occlusion, hemodynamically significant stenosis, dissection,  contrast extravasation or aneurysm within the anterior nor posterior circulation.  No abnormal intraparenchymal or extra-axial enhancement.  Review of the MIP images confirms the above findings.  CTA NECK FINDINGS  Normal appearance of the thoracic arch, normal branch pattern. Mild calcific atherosclerosis of the aortic arch. The origins of the innominate, left Common carotid artery and subclavian artery are widely patent. Streak artifact from retained LEFT subclavian venous contrast results in spurious hypodensity LEFT Common carotid artery proximally.  Bilateral Common carotid arteries are widely patent, coursing in a straight line fashion. Normal appearance of the carotid bifurcations without hemodynamically significant stenosis by NASCET criteria. Normal appearance of the included internal carotid arteries.  Left vertebral artery is dominant. Calcific atherosclerosis of the LEFT vertebral artery origin without hemodynamically significant stenosis. Tortuous LEFT vertebral artery V1 segment, with focal luminal irregularity and narrowing, coronal 74/239 and, slight poststenotic dilatation. Mild irregularity of the LEFT V2 segment, coronal 70/239.  No hemodynamically significant stenosis by NASCET criteria. No dissection, no pseudoaneurysm. No abnormal luminal irregularity. No contrast extravasation.  Soft tissues are nonsuspicious. No acute osseous process though bone windows have not been submitted. Included view of the chest demonstrate centrilobular emphysema and calcified apical pleural plaques with fibronodular scarring.  Review of the MIP images confirms the above findings.  IMPRESSION: CTA HEAD: Moderately motion degraded examination limits evaluation without large vessel occlusion. Mild luminal irregularity in a pattern suggesting atherosclerosis.  CTA NECK: Calcific atherosclerosis  of the carotid bulbs without hemodynamically significant stenosis by NASCET criteria.  LEFT V1 and V2 intimal irregularity  suggesting remote dissection without convincing evidence of acute vascular injury nor flow-limiting stenosis.   Electronically Signed   By: Elon Alas   On: 06/28/2014 01:02   Mr Brain Wo Contrast  06/28/2014   CLINICAL DATA:  Right-sided facial droop and weakness beginning yesterday. NIH stroke scale of 14.  EXAM: MRI HEAD WITHOUT CONTRAST  MRA HEAD WITHOUT CONTRAST  TECHNIQUE: Multiplanar, multiecho pulse sequences of the brain and surrounding structures were obtained without intravenous contrast. Angiographic images of the head were obtained using MRA technique without contrast.  COMPARISON:  CT head without contrast 06/27/2014. CTA head and neck 06/28/2014.  FINDINGS: MRI HEAD FINDINGS  Diffusion-weighted images demonstrate an acute nonhemorrhagic left paramedian pontine infarct measures 9 x 13 mm. T2 changes are associated with the area of acute infarction. Study is mildly degraded by patient motion.  Extensive confluent periventricular and subcortical white matter changes are present in addition. Remote lacunar infarcts are evident in the basal ganglia bilaterally. There is a remote lacunar infarct in the subcortical left parietal lobe white matter as well.  The ventricles are proportionate to the degree of atrophy. No significant extraaxial fluid collection is present.  Flow is present in the major intracranial arteries. The patient is status post bilateral lens replacements. The right maxillary sinus is mostly opacified. Mild mucosal thickening is worse on the right ethmoid air cells. The mastoid air cells are clear.  MRA HEAD FINDINGS  The A1 and M1 segments are normal. Anterior communicating artery is patent. The A2 segments are duplicated on the left, a normal variant. The MCA bifurcations are within normal limits. The branch vessels demonstrate mild distal small vessel attenuation.  The left vertebral artery is slightly dominant to the right. Mild atherosclerotic changes are noted within the  this vertebral arteries without a significant stenosis relative to the more distal vessel. There is mild narrowing of the distal basilar artery without a significant stenosis. Both posterior cerebral arteries originate from the basilar tip. There is some attenuation of distal PCA branch vessels bilaterally, worse on the right.  IMPRESSION: 1. Acute nonhemorrhagic left paramedian pontine infarct corresponds with the patient's symptoms. 2. No acute supratentorial infarcts. 3. Extensive atrophy and white matter disease likely reflects the sequela of chronic microvascular ischemia. 4. Atherosclerotic changes in the vertebral arteries and basilar artery without a significant stenosis. 5. Mild distal small vessel attenuation throughout, worse in the posterior circulation. These results will be called to the ordering clinician or representative by the Radiologist Assistant, and communication documented in the PACS or zVision Dashboard.   Electronically Signed   By: Lawrence Santiago M.D.   On: 06/28/2014 10:07   Dg Chest Port 1 View  06/28/2014   CLINICAL DATA:  Acute onset of weakness.  Initial encounter.  EXAM: PORTABLE CHEST - 1 VIEW  COMPARISON:  Chest radiograph performed 06/27/2014  FINDINGS: The lungs are well-aerated. Worsening right basilar airspace opacification is compatible with pneumonia. There is no evidence of pleural effusion or pneumothorax.  The cardiomediastinal silhouette is within normal limits. No acute osseous abnormalities are seen.  IMPRESSION: Mildly worsening right basilar pneumonia noted.   Electronically Signed   By: Garald Balding M.D.   On: 06/28/2014 01:38   Mr Jodene Nam Head/brain Wo Cm  06/28/2014   CLINICAL DATA:  Right-sided facial droop and weakness beginning yesterday. NIH stroke scale of 14.  EXAM: MRI HEAD WITHOUT  CONTRAST  MRA HEAD WITHOUT CONTRAST  TECHNIQUE: Multiplanar, multiecho pulse sequences of the brain and surrounding structures were obtained without intravenous contrast.  Angiographic images of the head were obtained using MRA technique without contrast.  COMPARISON:  CT head without contrast 06/27/2014. CTA head and neck 06/28/2014.  FINDINGS: MRI HEAD FINDINGS  Diffusion-weighted images demonstrate an acute nonhemorrhagic left paramedian pontine infarct measures 9 x 13 mm. T2 changes are associated with the area of acute infarction. Study is mildly degraded by patient motion.  Extensive confluent periventricular and subcortical white matter changes are present in addition. Remote lacunar infarcts are evident in the basal ganglia bilaterally. There is a remote lacunar infarct in the subcortical left parietal lobe white matter as well.  The ventricles are proportionate to the degree of atrophy. No significant extraaxial fluid collection is present.  Flow is present in the major intracranial arteries. The patient is status post bilateral lens replacements. The right maxillary sinus is mostly opacified. Mild mucosal thickening is worse on the right ethmoid air cells. The mastoid air cells are clear.  MRA HEAD FINDINGS  The A1 and M1 segments are normal. Anterior communicating artery is patent. The A2 segments are duplicated on the left, a normal variant. The MCA bifurcations are within normal limits. The branch vessels demonstrate mild distal small vessel attenuation.  The left vertebral artery is slightly dominant to the right. Mild atherosclerotic changes are noted within the this vertebral arteries without a significant stenosis relative to the more distal vessel. There is mild narrowing of the distal basilar artery without a significant stenosis. Both posterior cerebral arteries originate from the basilar tip. There is some attenuation of distal PCA branch vessels bilaterally, worse on the right.  IMPRESSION: 1. Acute nonhemorrhagic left paramedian pontine infarct corresponds with the patient's symptoms. 2. No acute supratentorial infarcts. 3. Extensive atrophy and white matter  disease likely reflects the sequela of chronic microvascular ischemia. 4. Atherosclerotic changes in the vertebral arteries and basilar artery without a significant stenosis. 5. Mild distal small vessel attenuation throughout, worse in the posterior circulation. These results will be called to the ordering clinician or representative by the Radiologist Assistant, and communication documented in the PACS or zVision Dashboard.   Electronically Signed   By: Lawrence Santiago M.D.   On: 06/28/2014 10:07    Medications:  Scheduled: . aspirin  300 mg Rectal Daily   Or  . aspirin  325 mg Oral Daily  . enoxaparin (LOVENOX) injection  40 mg Subcutaneous Daily  . [START ON 06/29/2014] levofloxacin (LEVAQUIN) IV  750 mg Intravenous Daily   Continuous: . sodium chloride 1,000 mL (06/28/14 0346)   [id number]  Assessment/Plan:  Principal Problem:   Stroke Active Problems:   CAP (community acquired pneumonia)   Normocytic anemia   Essential hypertension    Acute CVA with right-sided hemiplegia Neurology following and managing. NPO for swallow evaluation. Stroke work up in progress. Patient was on aspirin prior to arrival. PT/OT. On Asprin. Will need statin when able to take orally. Need to resolve issue of abnormal rhythm. Await repeat EKG.  Abnormal rhythm Not sure if his atrial fibrillation. First EKG shows artifacts and was discussed with on-call cardiologist Dr. Jules Husbands. Telemetry shows first degree AV block. Repeat EKG has been ordered.  Pneumonia. Community Acquired versus Aspiration Continue Levaquin. O2 as needed. Unlikely to be influenza. Will discontinue order.   Hypokalemia/Dehydration Will replete IV. IVF to continue. Magnesium level was normal.  Normocytic Anemia Closely follow CBC. Stable  so far.   History of Essential Hypertension Currently allowing for permissive hypertension due to acute stroke.  History of prostate cancer. Stable  Pharmacy to input home  medications.  DVT Prophylaxis: Lovenox    Code Status: Full Code  Family Communication: Discussed with patient, his wife and his daughter  Disposition Plan: Not ready for DC. Will likely need SNF.    LOS: 1 day   [facility] Pager [phone] 06/28/2014, 11:15 AM  If 8PM-8AM, please contact night-coverage at [email or web address], password [facility]

## 2014-06-28 NOTE — ED Notes (Signed)
Patient's watch given to wife.

## 2014-06-28 NOTE — Progress Notes (Signed)
VASCULAR LAB PRELIMINARY  PRELIMINARY  PRELIMINARY  PRELIMINARY  Carotid Dopplers completed.    Preliminary report:  1-39% ICA stenosis.  Vertebral artery flow is antegrade.   Stuti Sandin, RVT 06/28/2014, 10:40 AM

## 2014-06-28 NOTE — Progress Notes (Signed)
SLP Cancellation Note  Patient Details Name: [name] MRN: [id number] DOB: Apr 22, 1926   Cancelled treatment:       Reason Eval/Treat Not Completed: Patient at procedure or test/unavailable.     Germain Osgood, M.A. CCC-SLP [phone]  Germain Osgood 06/28/2014, 10:52 AM

## 2014-06-28 NOTE — Evaluation (Signed)
Speech Language Pathology Evaluation Patient Details Name: [name] MRN: [id number] DOB: [birth year] Today's Date: 06/28/2014 Time: 4193-7902 SLP Time Calculation (min) (ACUTE ONLY): 18 min  Problem List:  Patient Active Problem List   Diagnosis Date Noted  . Embolic stroke involving left carotid artery 06/28/2014  . Stroke 06/28/2014  . CAP (community acquired pneumonia) 06/28/2014  . Normocytic anemia 06/28/2014  . Essential hypertension 06/28/2014  . Hypokalemia 06/28/2014   Past Medical History:  Past Medical History  Diagnosis Date  . Hypertension   . Cancer    Past Surgical History:  Past Surgical History  Procedure Laterality Date  . Appendectomy    . Kidney surgery     HPI:  [name] is a 80 y.o. male with history of hypertension and prostate cancer. He had a productive cough for few days prior to admission. Patient had gone to his PCP on day of admission and was prescribed antibiotics for possible pneumonia. Later patient's family called EMS as they found that patient was having right facial droop with right-sided hemiplegia and was brought to the ER. MRI shows an acute nonhemorrhagic left paramedian pontine infarct.   Assessment / Plan / Recommendation Clinical Impression  Pt has a moderate to severe dysarthria, characterized by decreased breath support and imprecise articulation, which currently reduces intelligibility at the word and short phrase level. With Max cues from SLP to use a "loud voice", pt is able to increase his intelligibility for ~2 words, however then his intensity begins to wane again. Decreased intelligibility impacts overall cognitive-linguistic assessment, however pt appears to have some perseveration and echolalia. He is oriented to person, but believes he is in [place] (per wife, where they just returned from). He required Max cues from SLP to complete ~75% of one-step commands. Pt will benefit from CIR level therapy to maximize  functional communication and cognition.     SLP Assessment  Patient needs continued Speech Lanaguage Pathology Services    Follow Up Recommendations  Inpatient Rehab;24 hour supervision/assistance    Frequency and Duration min 2x/week  2 weeks   Pertinent Vitals/Pain Pain Assessment: Faces Faces Pain Scale: No hurt   SLP Goals  Patient/Family Stated Goal: none stated Potential to Achieve Goals (ACUTE ONLY): Good Potential Considerations (ACUTE ONLY): Severity of impairments  SLP Evaluation Prior Functioning  Cognitive/Linguistic Baseline: Within functional limits  Lives With: Spouse   Cognition  Overall Cognitive Status: Impaired/Different from baseline Arousal/Alertness: Lethargic Orientation Level: Oriented to person;Disoriented to place Attention: Sustained Sustained Attention: Impaired Sustained Attention Impairment: Verbal basic Memory: Impaired Memory Impairment: Decreased recall of new information Problem Solving: Impaired Problem Solving Impairment: Functional basic Safety/Judgment: Impaired    Comprehension  Auditory Comprehension Overall Auditory Comprehension: Impaired Commands: Impaired One Step Basic Commands: 50-74% accurate (with Max cues) Interfering Components: Attention;Processing speed;Working memory EffectiveTechniques: Surveyor, minerals: Not tested Reading Comprehension Reading Status: Not tested    Expression Expression Primary Mode of Expression: Verbal Verbal Expression Overall Verbal Expression: Other (comment) (difficult to assesss given severity of dysarthria) Initiation: No impairment Automatic Speech: Name Level of Generative/Spontaneous Verbalization: Phrase Other Verbal Expression Comments: appears to have some echolalia and perseveration Written Expression Written Expression: Not tested   Oral / Motor Oral Motor/Sensory Function Overall Oral Motor/Sensory Function:  Impaired Labial ROM: Reduced right Labial Symmetry: Abnormal symmetry right Labial Strength: Reduced Labial Sensation: Reduced Lingual ROM: Within Functional Limits Lingual Symmetry: Within Functional Limits Lingual Strength: Reduced Facial ROM: Reduced right Facial Symmetry: Right droop  Facial Strength: Reduced Velum: Within Functional Limits Motor Speech Overall Motor Speech: Impaired Respiration: Impaired Level of Impairment: Word Phonation: Low vocal intensity Resonance: Within functional limits Articulation: Impaired Level of Impairment: Word Intelligibility: Intelligibility reduced Word: 25-49% accurate Phrase: 25-49% accurate Effective Techniques: Increased vocal intensity   GO      Germain Osgood, M.A. CCC-SLP [phone]  Germain Osgood 06/28/2014, 3:13 PM

## 2014-06-28 NOTE — Progress Notes (Signed)
  Echocardiogram 2D Echocardiogram has been performed.  Darlina Sicilian M 06/28/2014, 11:34 AM

## 2014-06-28 NOTE — Progress Notes (Signed)
STROKE TEAM PROGRESS NOTE   HISTORY [name] is an 80 y.o. male who has been feeling poorly for the past few days with a cough. Today went to see his PCP who felt that he had a pneumonia. When he returned home attempted to take a nap. Yelled out because he was having some difficulty with his left eye. It seems there was some pain and he fel that he got something in his eye but nothing was there. At that time the wife checked th patient and found no focal abnormalities. She then called a retired family friend over who checked the patient as well and found no focal abnormalities. The wife is not clear about what time that was but felt it was around [place] on 06/27/2014. He then had a few spoonfuls of cereal. When he attempted to get up to go to bed bed was having difficulty. His wife came to check him again and she noted right sided weakness. EMS was called at that time and the patient was brought in as a code stroke. Patient was not administered TPA secondary to inability  to determine LKW. He was admitted for further evaluation and treatment.   SUBJECTIVE (INTERVAL HISTORY) His wife and daughter are at the bedside.  Family is anxious for therapy evals. Lots of questions related to plan of care addressed by Dr. Leonie Man.    OBJECTIVE Temp:  [97.5 F (36.4 C)-98.5 F (36.9 C)] 98.5 F (36.9 C) (12/03 1048) Pulse Rate:  [68-77] 77 (12/03 1200) Cardiac Rhythm:  [-] Normal sinus rhythm (12/03 1048) Resp:  [18-21] 18 (12/03 1048) BP: (140-159)/(52-77) 159/52 mmHg (12/03 1200) SpO2:  [97 %-100 %] 97 % (12/03 1200) Weight:  [68.04 kg (150 lb)] 68.04 kg (150 lb) (12/03 0223)   Recent Labs Lab 06/27/14 2355 06/28/14 0352 06/28/14 1158  GLUCAP 121* 123* 98    Recent Labs Lab 06/27/14 2334 06/27/14 2349 06/28/14 0448  NA 131* 132* 134*  K 3.2* 2.9* 2.9*  CL 91* 97 92*  CO2 23  --  22  GLUCOSE 146* 132* 78  BUN 31* 29* 27*  CREATININE 0.94 0.90 0.80  CALCIUM 9.6  --  9.2   MG  --   --  2.3    Recent Labs Lab 06/27/14 2334 06/28/14 0448  AST 21 18  ALT 32 28  ALKPHOS 155* 149*  BILITOT 0.5 0.6  PROT 7.1 6.5  ALBUMIN 2.7* 2.5*    Recent Labs Lab 06/27/14 2334 06/27/14 2349 06/28/14 0448  WBC 18.8*  --  17.6*  NEUTROABS 15.9*  --  15.0*  HGB 12.1* 13.6 11.8*  HCT 36.3* 40.0 35.8*  MCV 85.4  --  86.7  PLT 381  --  376   No results for input(s): CKTOTAL, CKMB, CKMBINDEX, TROPONINI in the last 168 hours.  Recent Labs  06/27/14 2334  LABPROT 14.8  INR 1.15   No results for input(s): COLORURINE, LABSPEC, PHURINE, GLUCOSEU, HGBUR, BILIRUBINUR, KETONESUR, PROTEINUR, UROBILINOGEN, NITRITE, LEUKOCYTESUR in the last 72 hours.  Invalid input(s): APPERANCEUR     Component Value Date/Time   CHOL 139 06/28/2014 0448   TRIG 108 06/28/2014 0448   HDL 23* 06/28/2014 0448   CHOLHDL 6.0 06/28/2014 0448   VLDL 22 06/28/2014 0448   LDLCALC 94 06/28/2014 0448   No results found for: HGBA1C No results found for: LABOPIA, COCAINSCRNUR, LABBENZ, AMPHETMU, THCU, LABBARB  No results for input(s): ETH in the last 168 hours.  Ct Angio Head W/cm &/or Wo  Cm  06/28/2014   CLINICAL DATA:  Nursing home patient, last seen at baseline earlier today, new onset RIGHT-sided hemi paresis. Recent diagnosis of pneumonia.  EXAM: CT ANGIOGRAPHY HEAD AND NECK  TECHNIQUE: Multidetector CT imaging of the head and neck was performed using the standard protocol during bolus administration of intravenous contrast. Multiplanar CT image reconstructions and MIPs were obtained to evaluate the vascular anatomy. Carotid stenosis measurements (when applicable) are obtained utilizing NASCET criteria, using the distal internal carotid diameter as the denominator.  CONTRAST:  141mL OMNIPAQUE IOHEXOL 350 MG/ML SOLN  COMPARISON:  CT of the head June 27, 2014 at  FINDINGS: CTA HEAD FINDINGS  Moderately motion degraded evaluation at the skullbase and, circle of Willis. Anterior circulation:  Normal appearance of the cervical internal carotid arteries, petrous, cavernous and supra clinoid internal carotid arteries. Widely patent anterior communicating artery. Normal appearance of the anterior and middle cerebral arteries.  Posterior circulation: LEFT vertebral artery is dominant with normal appearance of the vertebral arteries, vertebrobasilar junction and basilar artery. Due to motion, limited assessment of the basilar artery and branch vessels. Normal appearance of the posterior cerebral arteries.  Mild luminal irregularity of the mid to distal intracranial arteries most consistent with atherosclerosis No large vessel occlusion, hemodynamically significant stenosis, dissection, contrast extravasation or aneurysm within the anterior nor posterior circulation.  No abnormal intraparenchymal or extra-axial enhancement.  Review of the MIP images confirms the above findings.  CTA NECK FINDINGS  Normal appearance of the thoracic arch, normal branch pattern. Mild calcific atherosclerosis of the aortic arch. The origins of the innominate, left Common carotid artery and subclavian artery are widely patent. Streak artifact from retained LEFT subclavian venous contrast results in spurious hypodensity LEFT Common carotid artery proximally.  Bilateral Common carotid arteries are widely patent, coursing in a straight line fashion. Normal appearance of the carotid bifurcations without hemodynamically significant stenosis by NASCET criteria. Normal appearance of the included internal carotid arteries.  Left vertebral artery is dominant. Calcific atherosclerosis of the LEFT vertebral artery origin without hemodynamically significant stenosis. Tortuous LEFT vertebral artery V1 segment, with focal luminal irregularity and narrowing, coronal 74/239 and, slight poststenotic dilatation. Mild irregularity of the LEFT V2 segment, coronal 70/239.  No hemodynamically significant stenosis by NASCET criteria. No dissection, no  pseudoaneurysm. No abnormal luminal irregularity. No contrast extravasation.  Soft tissues are nonsuspicious. No acute osseous process though bone windows have not been submitted. Included view of the chest demonstrate centrilobular emphysema and calcified apical pleural plaques with fibronodular scarring.  Review of the MIP images confirms the above findings.  IMPRESSION: CTA HEAD: Moderately motion degraded examination limits evaluation without large vessel occlusion. Mild luminal irregularity in a pattern suggesting atherosclerosis.  CTA NECK: Calcific atherosclerosis of the carotid bulbs without hemodynamically significant stenosis by NASCET criteria.  LEFT V1 and V2 intimal irregularity suggesting remote dissection without convincing evidence of acute vascular injury nor flow-limiting stenosis.   Electronically Signed   By: Elon Alas   On: 06/28/2014 01:02   Dg Chest 2 View  06/27/2014   CLINICAL DATA:  Productive cough.  EXAM: CHEST  2 VIEW  COMPARISON:  08/11/2011.  FINDINGS: Mediastinum and hilar structures normal. Right lower in the low lobe infiltrate consistent with pneumonia. Left lung is clear. Biapical pleural parenchymal thickening consistent scarring. Heart size normal. No acute bony abnormality. Degenerative changes thoracic spine.  IMPRESSION: Right lower lobe and right middle lobe infiltrates consistent with pneumonia.   Electronically Signed   By: Marcello Moores  Register  On: 06/27/2014 12:47   Ct Head (brain) Wo Contrast  06/27/2014   CLINICAL DATA:  80 year old male with acute right arm weakness and facial droop. Code stroke. Initial encounter.  EXAM: CT HEAD WITHOUT CONTRAST  TECHNIQUE: Contiguous axial images were obtained from the base of the skull through the vertex without intravenous contrast.  COMPARISON:  None.  FINDINGS: Mild cerebral atrophy and moderate -severe chronic appearing small-vessel white matter ischemic changes are identified.  A right basal ganglia lacunar infarct  appears remote.  No acute intracranial abnormalities are identified, including mass lesion or mass effect, hydrocephalus, extra-axial fluid collection, midline shift, hemorrhage, or acute infarction.  The visualized bony calvarium is unremarkable.  Right maxillary sinus mucosal thickening is noted.  IMPRESSION: No evidence of acute intracranial abnormality.  Atrophy, chronic small-vessel white matter ischemic changes and remote appearing right basal ganglia lacunar infarct.  Critical Value/emergent results were called by telephone at the time of interpretation on 06/27/2014 at 11:56 pm to Dr. Julianne Rice , who verbally acknowledged these results.   Electronically Signed   By: Hassan Rowan M.D.   On: 06/27/2014 23:57   Ct Angio Neck W/cm &/or Wo/cm  06/28/2014   CLINICAL DATA:  Nursing home patient, last seen at baseline earlier today, new onset RIGHT-sided hemi paresis. Recent diagnosis of pneumonia.  EXAM: CT ANGIOGRAPHY HEAD AND NECK  TECHNIQUE: Multidetector CT imaging of the head and neck was performed using the standard protocol during bolus administration of intravenous contrast. Multiplanar CT image reconstructions and MIPs were obtained to evaluate the vascular anatomy. Carotid stenosis measurements (when applicable) are obtained utilizing NASCET criteria, using the distal internal carotid diameter as the denominator.  CONTRAST:  168mL OMNIPAQUE IOHEXOL 350 MG/ML SOLN  COMPARISON:  CT of the head June 27, 2014 at  FINDINGS: CTA HEAD FINDINGS  Moderately motion degraded evaluation at the skullbase and, circle of Willis. Anterior circulation: Normal appearance of the cervical internal carotid arteries, petrous, cavernous and supra clinoid internal carotid arteries. Widely patent anterior communicating artery. Normal appearance of the anterior and middle cerebral arteries.  Posterior circulation: LEFT vertebral artery is dominant with normal appearance of the vertebral arteries, vertebrobasilar junction  and basilar artery. Due to motion, limited assessment of the basilar artery and branch vessels. Normal appearance of the posterior cerebral arteries.  Mild luminal irregularity of the mid to distal intracranial arteries most consistent with atherosclerosis No large vessel occlusion, hemodynamically significant stenosis, dissection, contrast extravasation or aneurysm within the anterior nor posterior circulation.  No abnormal intraparenchymal or extra-axial enhancement.  Review of the MIP images confirms the above findings.  CTA NECK FINDINGS  Normal appearance of the thoracic arch, normal branch pattern. Mild calcific atherosclerosis of the aortic arch. The origins of the innominate, left Common carotid artery and subclavian artery are widely patent. Streak artifact from retained LEFT subclavian venous contrast results in spurious hypodensity LEFT Common carotid artery proximally.  Bilateral Common carotid arteries are widely patent, coursing in a straight line fashion. Normal appearance of the carotid bifurcations without hemodynamically significant stenosis by NASCET criteria. Normal appearance of the included internal carotid arteries.  Left vertebral artery is dominant. Calcific atherosclerosis of the LEFT vertebral artery origin without hemodynamically significant stenosis. Tortuous LEFT vertebral artery V1 segment, with focal luminal irregularity and narrowing, coronal 74/239 and, slight poststenotic dilatation. Mild irregularity of the LEFT V2 segment, coronal 70/239.  No hemodynamically significant stenosis by NASCET criteria. No dissection, no pseudoaneurysm. No abnormal luminal irregularity. No contrast extravasation.  Soft  tissues are nonsuspicious. No acute osseous process though bone windows have not been submitted. Included view of the chest demonstrate centrilobular emphysema and calcified apical pleural plaques with fibronodular scarring.  Review of the MIP images confirms the above findings.   IMPRESSION: CTA HEAD: Moderately motion degraded examination limits evaluation without large vessel occlusion. Mild luminal irregularity in a pattern suggesting atherosclerosis.  CTA NECK: Calcific atherosclerosis of the carotid bulbs without hemodynamically significant stenosis by NASCET criteria.  LEFT V1 and V2 intimal irregularity suggesting remote dissection without convincing evidence of acute vascular injury nor flow-limiting stenosis.   Electronically Signed   By: Elon Alas   On: 06/28/2014 01:02   Michael Brain Wo Contrast  06/28/2014   CLINICAL DATA:  Right-sided facial droop and weakness beginning yesterday. NIH stroke scale of 14.  EXAM: MRI HEAD WITHOUT CONTRAST  MRA HEAD WITHOUT CONTRAST  TECHNIQUE: Multiplanar, multiecho pulse sequences of the brain and surrounding structures were obtained without intravenous contrast. Angiographic images of the head were obtained using MRA technique without contrast.  COMPARISON:  CT head without contrast 06/27/2014. CTA head and neck 06/28/2014.  FINDINGS: MRI HEAD FINDINGS  Diffusion-weighted images demonstrate an acute nonhemorrhagic left paramedian pontine infarct measures 9 x 13 mm. T2 changes are associated with the area of acute infarction. Study is mildly degraded by patient motion.  Extensive confluent periventricular and subcortical white matter changes are present in addition. Remote lacunar infarcts are evident in the basal ganglia bilaterally. There is a remote lacunar infarct in the subcortical left parietal lobe white matter as well.  The ventricles are proportionate to the degree of atrophy. No significant extraaxial fluid collection is present.  Flow is present in the major intracranial arteries. The patient is status post bilateral lens replacements. The right maxillary sinus is mostly opacified. Mild mucosal thickening is worse on the right ethmoid air cells. The mastoid air cells are clear.  MRA HEAD FINDINGS  The A1 and M1 segments are  normal. Anterior communicating artery is patent. The A2 segments are duplicated on the left, a normal variant. The MCA bifurcations are within normal limits. The branch vessels demonstrate mild distal small vessel attenuation.  The left vertebral artery is slightly dominant to the right. Mild atherosclerotic changes are noted within the this vertebral arteries without a significant stenosis relative to the more distal vessel. There is mild narrowing of the distal basilar artery without a significant stenosis. Both posterior cerebral arteries originate from the basilar tip. There is some attenuation of distal PCA branch vessels bilaterally, worse on the right.  IMPRESSION: 1. Acute nonhemorrhagic left paramedian pontine infarct corresponds with the patient's symptoms. 2. No acute supratentorial infarcts. 3. Extensive atrophy and white matter disease likely reflects the sequela of chronic microvascular ischemia. 4. Atherosclerotic changes in the vertebral arteries and basilar artery without a significant stenosis. 5. Mild distal small vessel attenuation throughout, worse in the posterior circulation. These results will be called to the ordering clinician or representative by the Radiologist Assistant, and communication documented in the PACS or zVision Dashboard.   Electronically Signed   By: Lawrence Santiago M.D.   On: 06/28/2014 10:07   Dg Chest Port 1 View  06/28/2014   CLINICAL DATA:  Acute onset of weakness.  Initial encounter.  EXAM: PORTABLE CHEST - 1 VIEW  COMPARISON:  Chest radiograph performed 06/27/2014  FINDINGS: The lungs are well-aerated. Worsening right basilar airspace opacification is compatible with pneumonia. There is no evidence of pleural effusion or pneumothorax.  The  cardiomediastinal silhouette is within normal limits. No acute osseous abnormalities are seen.  IMPRESSION: Mildly worsening right basilar pneumonia noted.   Electronically Signed   By: Garald Balding M.D.   On: 06/28/2014 01:38    Michael Jodene Nam Head/brain Wo Cm  06/28/2014   CLINICAL DATA:  Right-sided facial droop and weakness beginning yesterday. NIH stroke scale of 14.  EXAM: MRI HEAD WITHOUT CONTRAST  MRA HEAD WITHOUT CONTRAST  TECHNIQUE: Multiplanar, multiecho pulse sequences of the brain and surrounding structures were obtained without intravenous contrast. Angiographic images of the head were obtained using MRA technique without contrast.  COMPARISON:  CT head without contrast 06/27/2014. CTA head and neck 06/28/2014.  FINDINGS: MRI HEAD FINDINGS  Diffusion-weighted images demonstrate an acute nonhemorrhagic left paramedian pontine infarct measures 9 x 13 mm. T2 changes are associated with the area of acute infarction. Study is mildly degraded by patient motion.  Extensive confluent periventricular and subcortical white matter changes are present in addition. Remote lacunar infarcts are evident in the basal ganglia bilaterally. There is a remote lacunar infarct in the subcortical left parietal lobe white matter as well.  The ventricles are proportionate to the degree of atrophy. No significant extraaxial fluid collection is present.  Flow is present in the major intracranial arteries. The patient is status post bilateral lens replacements. The right maxillary sinus is mostly opacified. Mild mucosal thickening is worse on the right ethmoid air cells. The mastoid air cells are clear.  MRA HEAD FINDINGS  The A1 and M1 segments are normal. Anterior communicating artery is patent. The A2 segments are duplicated on the left, a normal variant. The MCA bifurcations are within normal limits. The branch vessels demonstrate mild distal small vessel attenuation.  The left vertebral artery is slightly dominant to the right. Mild atherosclerotic changes are noted within the this vertebral arteries without a significant stenosis relative to the more distal vessel. There is mild narrowing of the distal basilar artery without a significant stenosis. Both  posterior cerebral arteries originate from the basilar tip. There is some attenuation of distal PCA branch vessels bilaterally, worse on the right.  IMPRESSION: 1. Acute nonhemorrhagic left paramedian pontine infarct corresponds with the patient's symptoms. 2. No acute supratentorial infarcts. 3. Extensive atrophy and white matter disease likely reflects the sequela of chronic microvascular ischemia. 4. Atherosclerotic changes in the vertebral arteries and basilar artery without a significant stenosis. 5. Mild distal small vessel attenuation throughout, worse in the posterior circulation. These results will be called to the ordering clinician or representative by the Radiologist Assistant, and communication documented in the PACS or zVision Dashboard.   Electronically Signed   By: Lawrence Santiago M.D.   On: 06/28/2014 10:07   Carotid Doppler  There is 1-39% bilateral ICA stenosis. Vertebral artery flow is antegrade.     PHYSICAL EXAM Frail elderly Caucasian male on nasal canula oxygen. . Afebrile. Head is nontraumatic. Neck is supple without bruit.   Cardiac exam no murmur or gallop. Lungs are clear to auscultation. Distal pulses are well felt. Neurological Exam : Awake alert severe dysarthria but can be understood with some difficulty. No aphasia. Follows midline and simple one-step commands. Right gaze preference but can look to the left past midline. Fundi were not visualized. Vision acuity seems adequate. Face is asymmetrical with moderate weakness of the right lower face. Tongue midline. Weak cough and gag. Dense right hemiplegia with hypotonia and 0/5 strength. Purposeful antigravity movements on the left side without weakness. Slight diminished right hemibody  sensation. Normal sensation and coordination on the left. Right plantar upgoing left downgoing. Gait was not tested. ASSESSMENT/PLAN Michael. GERHARD Reid is a 80 y.o. male with history of hypertension and prostate cancer presenting with right  sided weakness in the setting of community acquired pneumonia. He did not receive IV t-PA due to inability  to determine LKW.   Stroke:  Dominant left paramedian pontine infarct secondary to small vessel disease    Resultant  Right hemiparesis, dysarthria, likely dysphagia  MRI  L paramedian pontine infarct  MRA  small vessel disease, No large vessel stenosis   Carotid Doppler  No significant stenosis   2D Echo  pending   HgbA1c pending  Lovenox 40 mg sq daily for VTE prophylaxis    NPO. Awaiting ST to evaluate  no antithrombotics prior to admission, now on aspirin suppository daily  Ongoing aggressive stroke risk factor management Dr. Leonie Man discussed diagnosis, prognosis,  treatment options and plan of care with pt, wife and friend at bedside.   Anticipate slow recovery  Therapy recommendations:  pending   Disposition:  pending   Hypertension  Permissive hypertension (OK if < 220/120) but gradually normalize in 5-7 days  Stable  Hyperlipidemia  Home meds:  No statin  LDL 94, goal < 70  Add statin once able to swallow  Diabetes  HgbA1c pending  goal < 7.0  Other Stroke Risk Factors  Advanced age  Former Cigarette smoker  ETOH use  Community acquired vs aspiration pneumonia  On Levaquin  Other Active Problems  Abnormal HR, first depgree AV block per card, repeat EKG done  Hypokalemia/dehydration  Normocytic anemia  Other Pertinent History  Prostate cancer  Hospital day # [place] for Pager information 06/28/2014 4:32 PM   I have personally examined this patient, reviewed notes, independently viewed imaging studies, participated in medical decision making and plan of care. I have made any additions or clarifications directly to the above note. Agree with note above. I had a long discussion with the patient's wife and daughter regarding his pontine infarct and significant dense hemiplegia and dysarthria  and answered questions.. Anticipate slow recovery requiring prolonged rehabilitation  Antony Contras, MD Medical Director [place] Pager: [phone] 06/28/2014 4:42 PM     To contact Stroke Continuity provider, please refer to [email or web address] After hours, contact General Neurology

## 2014-06-28 NOTE — Consult Note (Addendum)
Referring Physician: Lita Mains    Chief Complaint: Right sided weakness  HPI: [name] is an 80 y.o. male who has been feeling poorly for the past few days with a cough.  Today went to see his PCP who felt that he had a pneumonia.  When he returned home attempted to take a nap.  Yelled out because he was having some difficulty with his left eye.  It seems there was some pain and he fel that he got something in his eye but nothing was there.  At that time the wife checked th patient and found no focal abnormalities.  She then called a retired family friend over who checked the patient as well and found no focal abnormalities.  The wife is not clear about what time that was but felt it was around 1930.  He then had a few spoonfuls of cereal.  When he attempted to get up to go to bed bed was having difficulty.  His wife came to check him again and she noted right sided weakness.  EMS was called at that time and the patient was brought in as a code stroke.    Date last known well: Date: 06/27/2014 Time last known well: Unable to determine tPA Given: No: Unable to determine LKW  Past medical history: Prostate cancer, Hypertension  Past surgical history: Tonsillectomy, radioactive seed implantation for prostate cancer  Family history: Mother and father deceased.  Father had colon cancer and heart disease  Social History:  Married.  A former smoker.  Quit in 1959.  Drinks one alcoholic beverage a night on occasion.  No history of illicit drug abuse.    Allergies: NKDA  Medications: I have reviewed the patient's current medications. Prior to Admission:  HCTZ  ROS: History obtained from wife  General ROS: negative for - chills, fatigue, fever, night sweats, weight gain or weight loss Psychological ROS: negative for - behavioral disorder, hallucinations, memory difficulties, mood swings or suicidal ideation Ophthalmic ROS: as noted in HPI ENT ROS: negative for - epistaxis, nasal discharge,  oral lesions, sore throat, tinnitus or vertigo Allergy and Immunology ROS: negative for - hives or itchy/watery eyes Hematological and Lymphatic ROS: negative for - bleeding problems, bruising or swollen lymph nodes Endocrine ROS: negative for - galactorrhea, hair pattern changes, polydipsia/polyuria or temperature intolerance Respiratory ROS: as noted in HPI Cardiovascular ROS: negative for - chest pain, dyspnea on exertion, edema or irregular heartbeat Gastrointestinal ROS: negative for - abdominal pain, diarrhea, hematemesis, nausea/vomiting or stool incontinence Genito-Urinary ROS: negative for - dysuria, hematuria, incontinence or urinary frequency/urgency Musculoskeletal ROS: negative for - joint swelling or muscular weakness Neurological ROS: as noted in HPI Dermatological ROS: negative for rash and skin lesion changes  Physical Examination: Blood pressure 140/52, pulse 70, temperature 98.1 F (36.7 C), temperature source Oral, resp. rate 18, SpO2 99 %.  HEENT-  Normocephalic, no lesions, without obvious abnormality.  Normal external eye and conjunctiva.  Normal TM's bilaterally.  Normal auditory canals and external ears. Normal external nose, mucus membranes and septum.  Normal pharynx. Cardiovascular- S1, S2 normal, pulses palpable throughout   Lungs- chest clear, no wheezing, rales, normal symmetric air entry Abdomen- soft, non-tender; bowel sounds normal; no masses,  no organomegaly Extremities- 2+ edema in the LE's bilaterally Lymph-no adenopathy palpable Musculoskeletal-no joint tenderness, deformity or swelling Skin-warm and dry, no hyperpigmentation, vitiligo, or suspicious lesions  Neurologic Examination: Mental Status: Lethargic.  Speech markedly dysarthric.  No evidence of aphasia.  Able to follow  3 step commands without difficulty. Cranial Nerves: II: Discs flat bilaterally; Visual fields grossly normal, pupils equal, round, reactive to light and accommodation III,IV,  VI: ptosis not present, right gaze preference with inability to go beyond midline to the left even with oculocephalic maneuvers.   V,VII: right facial droop, facial light touch sensation normal bilaterally VIII: hearing normal bilaterally IX,X: gag reflex reduced XI: shoulder shrug decreased on the right XII: midline tongue extension Motor: Right : Upper extremity   0/5    Left:     Upper extremity   5/5  Lower extremity   0/5     Lower extremity   5/5 Tone and bulk:normal tone throughout; no atrophy noted Sensory: Pinprick and light touch intact throughout, bilaterally Deep Tendon Reflexes: 1+ and symmetric throughout with absent AJ's bilaterally Plantars: Right: mute   Left: mute Cerebellar: normal finger-to-nose testing on the left upper extremity Gait: unable to test secondary to hemiplegia.        Laboratory Studies:  Basic Metabolic Panel:  Recent Labs Lab 06/27/14 2334 06/27/14 2349  NA 131* 132*  K 3.2* 2.9*  CL 91* 97  CO2 23  --   GLUCOSE 146* 132*  BUN 31* 29*  CREATININE 0.94 0.90  CALCIUM 9.6  --     Liver Function Tests:  Recent Labs Lab 06/27/14 2334  AST 21  ALT 32  ALKPHOS 155*  BILITOT 0.5  PROT 7.1  ALBUMIN 2.7*   No results for input(s): LIPASE, AMYLASE in the last 168 hours. No results for input(s): AMMONIA in the last 168 hours.  CBC:  Recent Labs Lab 06/27/14 2334 06/27/14 2349  WBC 18.8*  --   NEUTROABS 15.9*  --   HGB 12.1* 13.6  HCT 36.3* 40.0  MCV 85.4  --   PLT 381  --     Cardiac Enzymes: No results for input(s): CKTOTAL, CKMB, CKMBINDEX, TROPONINI in the last 168 hours.  BNP: Invalid input(s): POCBNP  CBG:  Recent Labs Lab 06/27/14 2355  GLUCAP 121*    Microbiology: No results found for this or any previous visit.  Coagulation Studies:  Recent Labs  06/27/14 2334  LABPROT 14.8  INR 1.15    Urinalysis: No results for input(s): COLORURINE, LABSPEC, PHURINE, GLUCOSEU, HGBUR, BILIRUBINUR, KETONESUR,  PROTEINUR, UROBILINOGEN, NITRITE, LEUKOCYTESUR in the last 168 hours.  Invalid input(s): APPERANCEUR  Lipid Panel: No results found for: CHOL, TRIG, HDL, CHOLHDL, VLDL, LDLCALC  HgbA1C: No results found for: HGBA1C  Urine Drug Screen:  No results found for: LABOPIA, COCAINSCRNUR, LABBENZ, AMPHETMU, THCU, LABBARB  Alcohol Level: No results for input(s): ETH in the last 168 hours.  Other results: EKG: atrial fibrillation, rate 80 bpm.  Imaging: Ct Angio Head W/cm &/or Wo Cm  06/28/2014   CLINICAL DATA:  Nursing home patient, last seen at baseline earlier today, new onset RIGHT-sided hemi paresis. Recent diagnosis of pneumonia.  EXAM: CT ANGIOGRAPHY HEAD AND NECK  TECHNIQUE: Multidetector CT imaging of the head and neck was performed using the standard protocol during bolus administration of intravenous contrast. Multiplanar CT image reconstructions and MIPs were obtained to evaluate the vascular anatomy. Carotid stenosis measurements (when applicable) are obtained utilizing NASCET criteria, using the distal internal carotid diameter as the denominator.  CONTRAST:  168mL OMNIPAQUE IOHEXOL 350 MG/ML SOLN  COMPARISON:  CT of the head June 27, 2014 at  FINDINGS: CTA HEAD FINDINGS  Moderately motion degraded evaluation at the skullbase and, circle of Willis. Anterior circulation: Normal appearance of the  cervical internal carotid arteries, petrous, cavernous and supra clinoid internal carotid arteries. Widely patent anterior communicating artery. Normal appearance of the anterior and middle cerebral arteries.  Posterior circulation: LEFT vertebral artery is dominant with normal appearance of the vertebral arteries, vertebrobasilar junction and basilar artery. Due to motion, limited assessment of the basilar artery and branch vessels. Normal appearance of the posterior cerebral arteries.  Mild luminal irregularity of the mid to distal intracranial arteries most consistent with atherosclerosis No large  vessel occlusion, hemodynamically significant stenosis, dissection, contrast extravasation or aneurysm within the anterior nor posterior circulation.  No abnormal intraparenchymal or extra-axial enhancement.  Review of the MIP images confirms the above findings.  CTA NECK FINDINGS  Normal appearance of the thoracic arch, normal branch pattern. Mild calcific atherosclerosis of the aortic arch. The origins of the innominate, left Common carotid artery and subclavian artery are widely patent. Streak artifact from retained LEFT subclavian venous contrast results in spurious hypodensity LEFT Common carotid artery proximally.  Bilateral Common carotid arteries are widely patent, coursing in a straight line fashion. Normal appearance of the carotid bifurcations without hemodynamically significant stenosis by NASCET criteria. Normal appearance of the included internal carotid arteries.  Left vertebral artery is dominant. Calcific atherosclerosis of the LEFT vertebral artery origin without hemodynamically significant stenosis. Tortuous LEFT vertebral artery V1 segment, with focal luminal irregularity and narrowing, coronal 74/239 and, slight poststenotic dilatation. Mild irregularity of the LEFT V2 segment, coronal 70/239.  No hemodynamically significant stenosis by NASCET criteria. No dissection, no pseudoaneurysm. No abnormal luminal irregularity. No contrast extravasation.  Soft tissues are nonsuspicious. No acute osseous process though bone windows have not been submitted. Included view of the chest demonstrate centrilobular emphysema and calcified apical pleural plaques with fibronodular scarring.  Review of the MIP images confirms the above findings.  IMPRESSION: CTA HEAD: Moderately motion degraded examination limits evaluation without large vessel occlusion. Mild luminal irregularity in a pattern suggesting atherosclerosis.  CTA NECK: Calcific atherosclerosis of the carotid bulbs without hemodynamically significant  stenosis by NASCET criteria.  LEFT V1 and V2 intimal irregularity suggesting remote dissection without convincing evidence of acute vascular injury nor flow-limiting stenosis.   Electronically Signed   By: Elon Alas   On: 06/28/2014 01:02   Dg Chest 2 View  06/27/2014   CLINICAL DATA:  Productive cough.  EXAM: CHEST  2 VIEW  COMPARISON:  08/11/2011.  FINDINGS: Mediastinum and hilar structures normal. Right lower in the low lobe infiltrate consistent with pneumonia. Left lung is clear. Biapical pleural parenchymal thickening consistent scarring. Heart size normal. No acute bony abnormality. Degenerative changes thoracic spine.  IMPRESSION: Right lower lobe and right middle lobe infiltrates consistent with pneumonia.   Electronically Signed   By: Marcello Moores  Register   On: 06/27/2014 12:47   Ct Head (brain) Wo Contrast  06/27/2014   CLINICAL DATA:  80 year old male with acute right arm weakness and facial droop. Code stroke. Initial encounter.  EXAM: CT HEAD WITHOUT CONTRAST  TECHNIQUE: Contiguous axial images were obtained from the base of the skull through the vertex without intravenous contrast.  COMPARISON:  None.  FINDINGS: Mild cerebral atrophy and moderate -severe chronic appearing small-vessel white matter ischemic changes are identified.  A right basal ganglia lacunar infarct appears remote.  No acute intracranial abnormalities are identified, including mass lesion or mass effect, hydrocephalus, extra-axial fluid collection, midline shift, hemorrhage, or acute infarction.  The visualized bony calvarium is unremarkable.  Right maxillary sinus mucosal thickening is noted.  IMPRESSION: No evidence  of acute intracranial abnormality.  Atrophy, chronic small-vessel white matter ischemic changes and remote appearing right basal ganglia lacunar infarct.  Critical Value/emergent results were called by telephone at the time of interpretation on 06/27/2014 at 11:56 pm to Dr. Julianne Rice , who verbally  acknowledged these results.   Electronically Signed   By: Hassan Rowan M.D.   On: 06/27/2014 23:57   Ct Angio Neck W/cm &/or Wo/cm  06/28/2014   CLINICAL DATA:  Nursing home patient, last seen at baseline earlier today, new onset RIGHT-sided hemi paresis. Recent diagnosis of pneumonia.  EXAM: CT ANGIOGRAPHY HEAD AND NECK  TECHNIQUE: Multidetector CT imaging of the head and neck was performed using the standard protocol during bolus administration of intravenous contrast. Multiplanar CT image reconstructions and MIPs were obtained to evaluate the vascular anatomy. Carotid stenosis measurements (when applicable) are obtained utilizing NASCET criteria, using the distal internal carotid diameter as the denominator.  CONTRAST:  167mL OMNIPAQUE IOHEXOL 350 MG/ML SOLN  COMPARISON:  CT of the head June 27, 2014 at  FINDINGS: CTA HEAD FINDINGS  Moderately motion degraded evaluation at the skullbase and, circle of Willis. Anterior circulation: Normal appearance of the cervical internal carotid arteries, petrous, cavernous and supra clinoid internal carotid arteries. Widely patent anterior communicating artery. Normal appearance of the anterior and middle cerebral arteries.  Posterior circulation: LEFT vertebral artery is dominant with normal appearance of the vertebral arteries, vertebrobasilar junction and basilar artery. Due to motion, limited assessment of the basilar artery and branch vessels. Normal appearance of the posterior cerebral arteries.  Mild luminal irregularity of the mid to distal intracranial arteries most consistent with atherosclerosis No large vessel occlusion, hemodynamically significant stenosis, dissection, contrast extravasation or aneurysm within the anterior nor posterior circulation.  No abnormal intraparenchymal or extra-axial enhancement.  Review of the MIP images confirms the above findings.  CTA NECK FINDINGS  Normal appearance of the thoracic arch, normal branch pattern. Mild calcific  atherosclerosis of the aortic arch. The origins of the innominate, left Common carotid artery and subclavian artery are widely patent. Streak artifact from retained LEFT subclavian venous contrast results in spurious hypodensity LEFT Common carotid artery proximally.  Bilateral Common carotid arteries are widely patent, coursing in a straight line fashion. Normal appearance of the carotid bifurcations without hemodynamically significant stenosis by NASCET criteria. Normal appearance of the included internal carotid arteries.  Left vertebral artery is dominant. Calcific atherosclerosis of the LEFT vertebral artery origin without hemodynamically significant stenosis. Tortuous LEFT vertebral artery V1 segment, with focal luminal irregularity and narrowing, coronal 74/239 and, slight poststenotic dilatation. Mild irregularity of the LEFT V2 segment, coronal 70/239.  No hemodynamically significant stenosis by NASCET criteria. No dissection, no pseudoaneurysm. No abnormal luminal irregularity. No contrast extravasation.  Soft tissues are nonsuspicious. No acute osseous process though bone windows have not been submitted. Included view of the chest demonstrate centrilobular emphysema and calcified apical pleural plaques with fibronodular scarring.  Review of the MIP images confirms the above findings.  IMPRESSION: CTA HEAD: Moderately motion degraded examination limits evaluation without large vessel occlusion. Mild luminal irregularity in a pattern suggesting atherosclerosis.  CTA NECK: Calcific atherosclerosis of the carotid bulbs without hemodynamically significant stenosis by NASCET criteria.  LEFT V1 and V2 intimal irregularity suggesting remote dissection without convincing evidence of acute vascular injury nor flow-limiting stenosis.   Electronically Signed   By: Elon Alas   On: 06/28/2014 01:02    Assessment: 80 y.o. male presenting with right sided hemiplegia and new onset  atrial fibrillation in the  setting of a right middle lobe pneumonia.  Afib rate controlled.  Unclear LKW but at best estimation patient presented outside tPA window.  Head CT personally reviewed and shows no acute changes but small vessel disease is apparent.  Will rule out the possibility of a retrievable embolus.  NIHSS of 14.    Stroke Risk Factors - atrial fibrillation and hypertension  Plan: 1. HgbA1c, fasting lipid panel 2. MRI, MRA  of the brain without contrast 3. PT consult, OT consult, Speech consult 4. Echocardiogram 5. Carotid dopplers 6. Prophylactic therapy-Antiplatelet med: Aspirin - dose 300mg  rectally 7. NPO until RN stroke swallow screen 8. Telemetry monitoring 9. Frequent neuro checks 10. CTA of the head and neck STAT  Alexis Goodell, MD [facility] [phone] 06/28/2014, 1:17 AM  Addendum: CTA of the head and neck personally reviewed and shows no evidence of acute occlusion.  No mechanical  intervention recommended.  Remaining stroke work up to be initiated.    Alexis Goodell, MD [facility] [phone]

## 2014-06-28 NOTE — H&P (Signed)
[facility] History and Physical  [name] PYK:[id number] DOB: 24-Apr-1926 DOA: 06/27/2014  Referring physician: ER physician. PCP: No primary care provider on file.   Chief Complaint: Right-sided weakness.  History of the patient's wife and ER physician.  HPI: [name] is a 80 y.o. male with history of hypertension and prostate cancer was not doing well for last couple of days productive cough. Patient had gone to his PCP today and was prescribed antibiotics for possible pneumonia. Later in the evening patient was complaining of more discomfort in patient with concern for stroke and called her friend. Initially they did not notice anything acute. Later patient's family called EMS and was found that patient was having right facial droop with right-sided hemiplegia and was brought to the ER. In the ER patient was seen by neurologist Dr. Doy Mince. Since the time of onset of patient's symptoms was not clear patient was felt to be not a candidate for TPA. CT head was not showing anything acute. EKG was showing some abnormal rhythm but was of work quality. Patient does have leukocytosis and chest x-ray shows pneumonia. Patient otherwise did not have any headache visual symptoms. Denies nausea vomiting chest pain or shortness of breath abdominal pain. Patient is able to move his left side.  Review of Systems: As presented in the history of presenting illness, rest negative.  Past Medical History  Diagnosis Date  . Hypertension   . Cancer    Past Surgical History  Procedure Laterality Date  . Appendectomy    . Kidney surgery     Social History:  reports that he has quit smoking. He does not have any smokeless tobacco history on file. He reports that he drinks alcohol. He reports that he does not use illicit drugs. Where does patient lhome. Can patient participate in ADLs? not sure.   No Known Allergies  Family History:  Family History  Problem Relation Age of Onset  .  CAD Father       Prior to Admission medications   Not on File    Physical Exam: Filed Vitals:   06/28/14 0001 06/28/14 0045 06/28/14 0223  BP: 147/77 140/52 140/60  Pulse: 75 70 75  Temp: 98.1 F (36.7 C)  97.5 F (36.4 C)  TempSrc: Oral  Oral  Resp: 21 18 18   Height:   5\' 9"  (1.753 m)  Weight:   68.04 kg (150 lb)  SpO2: 100% 99% 100%     General:  Well-developed well-nourished.  Eyes: Anicteric no pallor.  ENT: Right facial droop.  Neck: No mass felt no neck rigidity.  Cardiovascular: S1 and S2 heard.  Respiratory: No rhonchi or crepitations.  Abdomen: Soft nontender bowel sounds present.  Skin: No rash.  Musculoskeletal: No edema.  Psychiatric: Appears normal.  Neurologic: Alert awake oriented to his name. Patient is right facial droop. Right-sided dense hemiplegia. Able to move his left side. Perla positive.  Labs on Admission:  Basic Metabolic Panel:  Recent Labs Lab 06/27/14 2334 06/27/14 2349  NA 131* 132*  K 3.2* 2.9*  CL 91* 97  CO2 23  --   GLUCOSE 146* 132*  BUN 31* 29*  CREATININE 0.94 0.90  CALCIUM 9.6  --    Liver Function Tests:  Recent Labs Lab 06/27/14 2334  AST 21  ALT 32  ALKPHOS 155*  BILITOT 0.5  PROT 7.1  ALBUMIN 2.7*   No results for input(s): LIPASE, AMYLASE in the last 168 hours. No results for input(s): AMMONIA  in the last 168 hours. CBC:  Recent Labs Lab 06/27/14 2334 06/27/14 2349  WBC 18.8*  --   NEUTROABS 15.9*  --   HGB 12.1* 13.6  HCT 36.3* 40.0  MCV 85.4  --   PLT 381  --    Cardiac Enzymes: No results for input(s): CKTOTAL, CKMB, CKMBINDEX, TROPONINI in the last 168 hours.  BNP (last 3 results) No results for input(s): PROBNP in the last 8760 hours. CBG:  Recent Labs Lab 06/27/14 2355  GLUCAP 121*    Radiological Exams on Admission: Ct Angio Head W/cm &/or Wo Cm  06/28/2014   CLINICAL DATA:  Nursing home patient, last seen at baseline earlier today, new onset RIGHT-sided hemi  paresis. Recent diagnosis of pneumonia.  EXAM: CT ANGIOGRAPHY HEAD AND NECK  TECHNIQUE: Multidetector CT imaging of the head and neck was performed using the standard protocol during bolus administration of intravenous contrast. Multiplanar CT image reconstructions and MIPs were obtained to evaluate the vascular anatomy. Carotid stenosis measurements (when applicable) are obtained utilizing NASCET criteria, using the distal internal carotid diameter as the denominator.  CONTRAST:  171mL OMNIPAQUE IOHEXOL 350 MG/ML SOLN  COMPARISON:  CT of the head June 27, 2014 at  FINDINGS: CTA HEAD FINDINGS  Moderately motion degraded evaluation at the skullbase and, circle of Willis. Anterior circulation: Normal appearance of the cervical internal carotid arteries, petrous, cavernous and supra clinoid internal carotid arteries. Widely patent anterior communicating artery. Normal appearance of the anterior and middle cerebral arteries.  Posterior circulation: LEFT vertebral artery is dominant with normal appearance of the vertebral arteries, vertebrobasilar junction and basilar artery. Due to motion, limited assessment of the basilar artery and branch vessels. Normal appearance of the posterior cerebral arteries.  Mild luminal irregularity of the mid to distal intracranial arteries most consistent with atherosclerosis No large vessel occlusion, hemodynamically significant stenosis, dissection, contrast extravasation or aneurysm within the anterior nor posterior circulation.  No abnormal intraparenchymal or extra-axial enhancement.  Review of the MIP images confirms the above findings.  CTA NECK FINDINGS  Normal appearance of the thoracic arch, normal branch pattern. Mild calcific atherosclerosis of the aortic arch. The origins of the innominate, left Common carotid artery and subclavian artery are widely patent. Streak artifact from retained LEFT subclavian venous contrast results in spurious hypodensity LEFT Common carotid  artery proximally.  Bilateral Common carotid arteries are widely patent, coursing in a straight line fashion. Normal appearance of the carotid bifurcations without hemodynamically significant stenosis by NASCET criteria. Normal appearance of the included internal carotid arteries.  Left vertebral artery is dominant. Calcific atherosclerosis of the LEFT vertebral artery origin without hemodynamically significant stenosis. Tortuous LEFT vertebral artery V1 segment, with focal luminal irregularity and narrowing, coronal 74/239 and, slight poststenotic dilatation. Mild irregularity of the LEFT V2 segment, coronal 70/239.  No hemodynamically significant stenosis by NASCET criteria. No dissection, no pseudoaneurysm. No abnormal luminal irregularity. No contrast extravasation.  Soft tissues are nonsuspicious. No acute osseous process though bone windows have not been submitted. Included view of the chest demonstrate centrilobular emphysema and calcified apical pleural plaques with fibronodular scarring.  Review of the MIP images confirms the above findings.  IMPRESSION: CTA HEAD: Moderately motion degraded examination limits evaluation without large vessel occlusion. Mild luminal irregularity in a pattern suggesting atherosclerosis.  CTA NECK: Calcific atherosclerosis of the carotid bulbs without hemodynamically significant stenosis by NASCET criteria.  LEFT V1 and V2 intimal irregularity suggesting remote dissection without convincing evidence of acute vascular injury nor flow-limiting stenosis.  Electronically Signed   By: Elon Alas   On: 06/28/2014 01:02   Dg Chest 2 View  06/27/2014   CLINICAL DATA:  Productive cough.  EXAM: CHEST  2 VIEW  COMPARISON:  08/11/2011.  FINDINGS: Mediastinum and hilar structures normal. Right lower in the low lobe infiltrate consistent with pneumonia. Left lung is clear. Biapical pleural parenchymal thickening consistent scarring. Heart size normal. No acute bony abnormality.  Degenerative changes thoracic spine.  IMPRESSION: Right lower lobe and right middle lobe infiltrates consistent with pneumonia.   Electronically Signed   By: Marcello Moores  Register   On: 06/27/2014 12:47   Ct Head (brain) Wo Contrast  06/27/2014   CLINICAL DATA:  80 year old male with acute right arm weakness and facial droop. Code stroke. Initial encounter.  EXAM: CT HEAD WITHOUT CONTRAST  TECHNIQUE: Contiguous axial images were obtained from the base of the skull through the vertex without intravenous contrast.  COMPARISON:  None.  FINDINGS: Mild cerebral atrophy and moderate -severe chronic appearing small-vessel white matter ischemic changes are identified.  A right basal ganglia lacunar infarct appears remote.  No acute intracranial abnormalities are identified, including mass lesion or mass effect, hydrocephalus, extra-axial fluid collection, midline shift, hemorrhage, or acute infarction.  The visualized bony calvarium is unremarkable.  Right maxillary sinus mucosal thickening is noted.  IMPRESSION: No evidence of acute intracranial abnormality.  Atrophy, chronic small-vessel white matter ischemic changes and remote appearing right basal ganglia lacunar infarct.  Critical Value/emergent results were called by telephone at the time of interpretation on 06/27/2014 at 11:56 pm to Dr. Julianne Rice , who verbally acknowledged these results.   Electronically Signed   By: Hassan Rowan M.D.   On: 06/27/2014 23:57   Ct Angio Neck W/cm &/or Wo/cm  06/28/2014   CLINICAL DATA:  Nursing home patient, last seen at baseline earlier today, new onset RIGHT-sided hemi paresis. Recent diagnosis of pneumonia.  EXAM: CT ANGIOGRAPHY HEAD AND NECK  TECHNIQUE: Multidetector CT imaging of the head and neck was performed using the standard protocol during bolus administration of intravenous contrast. Multiplanar CT image reconstructions and MIPs were obtained to evaluate the vascular anatomy. Carotid stenosis measurements (when  applicable) are obtained utilizing NASCET criteria, using the distal internal carotid diameter as the denominator.  CONTRAST:  192mL OMNIPAQUE IOHEXOL 350 MG/ML SOLN  COMPARISON:  CT of the head June 27, 2014 at  FINDINGS: CTA HEAD FINDINGS  Moderately motion degraded evaluation at the skullbase and, circle of Willis. Anterior circulation: Normal appearance of the cervical internal carotid arteries, petrous, cavernous and supra clinoid internal carotid arteries. Widely patent anterior communicating artery. Normal appearance of the anterior and middle cerebral arteries.  Posterior circulation: LEFT vertebral artery is dominant with normal appearance of the vertebral arteries, vertebrobasilar junction and basilar artery. Due to motion, limited assessment of the basilar artery and branch vessels. Normal appearance of the posterior cerebral arteries.  Mild luminal irregularity of the mid to distal intracranial arteries most consistent with atherosclerosis No large vessel occlusion, hemodynamically significant stenosis, dissection, contrast extravasation or aneurysm within the anterior nor posterior circulation.  No abnormal intraparenchymal or extra-axial enhancement.  Review of the MIP images confirms the above findings.  CTA NECK FINDINGS  Normal appearance of the thoracic arch, normal branch pattern. Mild calcific atherosclerosis of the aortic arch. The origins of the innominate, left Common carotid artery and subclavian artery are widely patent. Streak artifact from retained LEFT subclavian venous contrast results in spurious hypodensity LEFT Common carotid artery proximally.  Bilateral Common carotid arteries are widely patent, coursing in a straight line fashion. Normal appearance of the carotid bifurcations without hemodynamically significant stenosis by NASCET criteria. Normal appearance of the included internal carotid arteries.  Left vertebral artery is dominant. Calcific atherosclerosis of the LEFT  vertebral artery origin without hemodynamically significant stenosis. Tortuous LEFT vertebral artery V1 segment, with focal luminal irregularity and narrowing, coronal 74/239 and, slight poststenotic dilatation. Mild irregularity of the LEFT V2 segment, coronal 70/239.  No hemodynamically significant stenosis by NASCET criteria. No dissection, no pseudoaneurysm. No abnormal luminal irregularity. No contrast extravasation.  Soft tissues are nonsuspicious. No acute osseous process though bone windows have not been submitted. Included view of the chest demonstrate centrilobular emphysema and calcified apical pleural plaques with fibronodular scarring.  Review of the MIP images confirms the above findings.  IMPRESSION: CTA HEAD: Moderately motion degraded examination limits evaluation without large vessel occlusion. Mild luminal irregularity in a pattern suggesting atherosclerosis.  CTA NECK: Calcific atherosclerosis of the carotid bulbs without hemodynamically significant stenosis by NASCET criteria.  LEFT V1 and V2 intimal irregularity suggesting remote dissection without convincing evidence of acute vascular injury nor flow-limiting stenosis.   Electronically Signed   By: Elon Alas   On: 06/28/2014 01:02   Dg Chest Port 1 View  06/28/2014   CLINICAL DATA:  Acute onset of weakness.  Initial encounter.  EXAM: PORTABLE CHEST - 1 VIEW  COMPARISON:  Chest radiograph performed 06/27/2014  FINDINGS: The lungs are well-aerated. Worsening right basilar airspace opacification is compatible with pneumonia. There is no evidence of pleural effusion or pneumothorax.  The cardiomediastinal silhouette is within normal limits. No acute osseous abnormalities are seen.  IMPRESSION: Mildly worsening right basilar pneumonia noted.   Electronically Signed   By: Garald Balding M.D.   On: 06/28/2014 01:38    EKG: Independently reviewed. Patient's EKG has poor quality with artifacts. I did discuss with cardiologist at this  time. It's not sure if patient has atrial fibrillation.  Assessment/Plan Principal Problem:   Stroke Active Problems:   CAP (community acquired pneumonia)   Atrial fibrillation, new onset   Normocytic anemia   Essential hypertension   1. Stroke with right-sided hemiplegia - appreciate neurology consult. Patient has been placed on swallow evaluation and neuro checks. Get MRI/MRA brain 2-D echo and carotid Dopplers. Patient is on aspirin. 2. Abnormal rhythm - not sure if his atrial fibrillation. First EKG shows artifacts and I did discuss with on-call cardiologist Dr. Jules Husbands. Telemetry shows first degree AV block. Repeat EKG has been ordered. 3. Pneumonia - patient has been placed on Levaquin for community-acquired pneumonia. Check influenza PCR. 4. Mild hypokalemia probably from HCTZ - replace and recheck. Check magnesium level. 5. Normocytic anemia - closely follow CBC and if there is no significant fall in hemoglobin then further workup as outpatient. 6. History of hypertension presently allowing for permissive hypertension due to acute stroke. 7. History of prostate cancer.    Code Status: Full code.  Family Communication: Patient's wife.  Disposition Plan: Admit to inpatient.    Vivia Rosenburg N. [facility] Pager [phone].  If 7PM-7AM, please contact night-coverage [email or web address] Password [email or web address] 06/28/2014, 3:24 AM

## 2014-06-28 NOTE — ED Notes (Addendum)
CBG= 121.  RN-Taryn and MD informed of result.

## 2014-06-28 NOTE — ED Notes (Signed)
Pt monitored by pulse ox, bp cuff, and 12-lead. 

## 2014-06-28 NOTE — ED Notes (Signed)
Family at bedside. The tech gave patient's belongings to the wife and daughter a gold watch, pants, shirt, robe and money in the pockets. The tech reported to the RN in charge.

## 2014-06-29 ENCOUNTER — Inpatient Hospital Stay (HOSPITAL_COMMUNITY): Payer: Medicare Other

## 2014-06-29 DIAGNOSIS — D649 Anemia, unspecified: Secondary | ICD-10-CM

## 2014-06-29 DIAGNOSIS — R1314 Dysphagia, pharyngoesophageal phase: Secondary | ICD-10-CM

## 2014-06-29 DIAGNOSIS — I639 Cerebral infarction, unspecified: Principal | ICD-10-CM

## 2014-06-29 DIAGNOSIS — G8110 Spastic hemiplegia affecting unspecified side: Secondary | ICD-10-CM

## 2014-06-29 DIAGNOSIS — E876 Hypokalemia: Secondary | ICD-10-CM

## 2014-06-29 LAB — BASIC METABOLIC PANEL
Anion gap: 18 — ABNORMAL HIGH (ref 5–15)
BUN: 25 mg/dL — AB (ref 6–23)
CHLORIDE: 98 meq/L (ref 96–112)
CO2: 21 mEq/L (ref 19–32)
CREATININE: 0.83 mg/dL (ref 0.50–1.35)
Calcium: 9.1 mg/dL (ref 8.4–10.5)
GFR calc Af Amer: 90 mL/min — ABNORMAL LOW (ref >90)
GFR calc non Af Amer: 77 mL/min — ABNORMAL LOW (ref >90)
Glucose, Bld: 101 mg/dL — ABNORMAL HIGH (ref 70–99)
Potassium: 3.9 mEq/L (ref 3.7–5.3)
Sodium: 137 mEq/L (ref 137–147)

## 2014-06-29 LAB — CBC
HCT: 35.5 % — ABNORMAL LOW (ref 39.0–52.0)
Hemoglobin: 11.8 g/dL — ABNORMAL LOW (ref 13.0–17.0)
MCH: 28.8 pg (ref 26.0–34.0)
MCHC: 33.2 g/dL (ref 30.0–36.0)
MCV: 86.6 fL (ref 78.0–100.0)
PLATELETS: 344 10*3/uL (ref 150–400)
RBC: 4.1 MIL/uL — ABNORMAL LOW (ref 4.22–5.81)
RDW: 12.8 % (ref 11.5–15.5)
WBC: 20.3 10*3/uL — ABNORMAL HIGH (ref 4.0–10.5)

## 2014-06-29 LAB — GLUCOSE, CAPILLARY
GLUCOSE-CAPILLARY: 94 mg/dL (ref 70–99)
GLUCOSE-CAPILLARY: 106 mg/dL — AB (ref 70–99)
GLUCOSE-CAPILLARY: 105 mg/dL — AB (ref 70–99)

## 2014-06-29 MED ORDER — ATORVASTATIN CALCIUM 10 MG PO TABS
20.0000 mg | ORAL_TABLET | Freq: Every day | ORAL | Status: DC
  Administered 2014-06-29 – 2014-07-01 (×3): 20 mg via ORAL
  Filled 2014-06-29 (×2): qty 2

## 2014-06-29 MED ORDER — ATORVASTATIN CALCIUM 40 MG PO TABS
ORAL_TABLET | ORAL | Status: AC
  Filled 2014-06-29: qty 1

## 2014-06-29 MED ORDER — IBUPROFEN 200 MG PO TABS
200.0000 mg | ORAL_TABLET | Freq: Four times a day (QID) | ORAL | Status: DC | PRN
Start: 2014-06-29 — End: 2014-07-02
  Administered 2014-06-29: 200 mg via ORAL
  Filled 2014-06-29 (×3): qty 1

## 2014-06-29 NOTE — Progress Notes (Signed)
INITIAL NUTRITION ASSESSMENT  DOCUMENTATION CODES Per approved criteria  -Not Applicable   INTERVENTION: Diet advancement per MD RD to continue to monitor for PO adeuquacy  NUTRITION DIAGNOSIS: Inadequate oral intake related to inability to eat as evidenced by NPO status.   Goal: Pt to meet >/= 90% of their estimated nutrition needs  Monitor:  Diet advancement, PO intake, weight trend, labs  Reason for Assessment: Low Braden Score  80 y.o. male  Admitting Dx: Stroke  ASSESSMENT: 80 y.o. left handed male with history of HTN, prostate cancer, recent PNA who was admitted on 06/27/14 with right sided weakness with right gaze preference and difficulty talking. 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  Patient was assessed by SLP yesterday afternoon with recommendations for patient to remain NPO. Patient asleep at time of visit. Per patient's wife patient was eating very well PTA and he has been maintaining his weight. No previous issues with chewing or swallowing.   Labs reviewed.   Height: Ht Readings from Last 1 Encounters:  06/28/14 5\' 9"  (1.753 m)    Weight: Wt Readings from Last 1 Encounters:  06/28/14 150 lb (68.04 kg)    Ideal Body Weight: 160 lbs  % Ideal Body Weight: 94%  Wt Readings from Last 10 Encounters:  06/28/14 150 lb (68.04 kg)    Usual Body Weight: 150 lbs  % Usual Body Weight: 100%  BMI:  Body mass index is 22.14 kg/(m^2).  Estimated Nutritional Needs: Kcal: 1700-1900 Protein: 80-90 grams Fluid: 1.9 L/day  Skin: +1 LUE and RLE edema  Diet Order:   NPO  EDUCATION NEEDS: -No education needs identified at this time   Intake/Output Summary (Last 24 hours) at 06/29/14 1120 Last data filed at 06/29/14 6283  Gross per 24 hour  Intake      0 ml  Output    500 ml  Net   -500 ml    Last BM: PTA  Labs:   Recent Labs Lab 06/28/14 0448 06/28/14 2010  06/29/14 0545  NA 134* 134* 137  K 2.9* 3.6* 3.9  CL 92* 96 98  CO2 22 22 21   BUN 27* 23 25*  CREATININE 0.80 0.82 0.83  CALCIUM 9.2 9.1 9.1  MG 2.3  --   --   GLUCOSE 78 91 101*    CBG (last 3)   Recent Labs  06/28/14 2022 06/28/14 2354 06/29/14 0353  GLUCAP 87 82 94    Scheduled Meds: . aspirin  300 mg Rectal Daily   Or  . aspirin  325 mg Oral Daily  . enoxaparin (LOVENOX) injection  40 mg Subcutaneous Daily  . levofloxacin (LEVAQUIN) IV  750 mg Intravenous Daily    Continuous Infusions: . 0.9 % NaCl with KCl 20 mEq / L 75 mL/hr at 06/28/14 1132    Past Medical History  Diagnosis Date  . Hypertension   . Cancer     Past Surgical History  Procedure Laterality Date  . Appendectomy    . Kidney surgery      Pryor Ochoa RD, LDN Inpatient Clinical Dietitian Pager: [phone] After Hours Pager: [phone]

## 2014-06-29 NOTE — Procedures (Signed)
Objective Swallowing Evaluation: Modified Barium Swallowing Study  Patient Details  Name: [name] MRN: [id number] Date of Birth: 09-25-1925  Today's Date: 06/29/2014 Time: 1550-1615 SLP Time Calculation (min) (ACUTE ONLY): 25 min  Past Medical History:  Past Medical History  Diagnosis Date  . Hypertension   . Cancer    Past Surgical History:  Past Surgical History  Procedure Laterality Date  . Appendectomy    . Kidney surgery     HPI:  [name] is a 80 y.o. male with history of hypertension and prostate cancer. He had a productive cough for few days prior to admission. Patient had gone to his PCP on day of admission and was prescribed antibiotics for possible pneumonia. Later patient's family called EMS as they found that patient was having right facial droop with right-sided hemiplegia and was brought to the ER. MRI shows an acute nonhemorrhagic left paramedian pontine infarct.     Assessment / Plan / Recommendation Clinical Impression  Dysphagia Diagnosis: Moderate oral phase dysphagia;Moderate pharyngeal phase dysphagia  Clinical impression: Pt has a moderate sensorimotor oropharyngeal dysphagia. Oral phase is marked by weak manipulation and delayed transit, requiring intermittent cues for posterior propulsion. This leads to premature spillage into the pharynx that is primarily contained in the valleculae, except for with thin liquids which spill to the pyriform sinuses. This, paired with a delayed swallow initiation leads to silent penetration with thin liquids and large straw sips of nectar thick liquids, with pt unable to clear penetrates. Thin liquids are ultimately sensed when they reach, and begin to pass through, the vocal folds. No airway penetration is observed with solids or smaller cup sips of nectar thick liquids, although diffuse weakness leads to residuals post-swallow. Recommend Dys 1 diet and nectar thick liquids with full supervision. SLP to continue to  follow for tolerance and advancement.    Treatment Recommendation  Therapy as outlined in treatment plan below    Diet Recommendation Dysphagia 1 (Puree);Nectar-thick liquid   Liquid Administration via: Cup;No straw Medication Administration: Crushed with puree Supervision: Patient able to self feed;Full supervision/cueing for compensatory strategies Compensations: Slow rate;Small sips/bites;Multiple dry swallows after each bite/sip;Check for pocketing;Check for anterior loss Postural Changes and/or Swallow Maneuvers: Seated upright 90 degrees;Upright 30-60 min after meal    Other  Recommendations Oral Care Recommendations: Oral care BID;Oral care before and after PO Other Recommendations: Order thickener from pharmacy;Prohibited food (jello, ice cream, thin soups);Remove water pitcher;Have oral suction available   Follow Up Recommendations  Inpatient Rehab;24 hour supervision/assistance    Frequency and Duration min 2x/week  2 weeks   Pertinent Vitals/Pain n/a    SLP Swallow Goals     General Date of Onset: 06/27/14 HPI: [name] is a 80 y.o. male with history of hypertension and prostate cancer. He had a productive cough for few days prior to admission. Patient had gone to his PCP on day of admission and was prescribed antibiotics for possible pneumonia. Later patient's family called EMS as they found that patient was having right facial droop with right-sided hemiplegia and was brought to the ER. MRI shows an acute nonhemorrhagic left paramedian pontine infarct. Type of Study: Modified Barium Swallowing Study Reason for Referral: Objectively evaluate swallowing function Previous Swallow Assessment: BSE 12/03 recommending NPO pending MBS Diet Prior to this Study: NPO Temperature Spikes Noted: No Respiratory Status: Nasal cannula History of Recent Intubation: No Behavior/Cognition: Lethargic;Cooperative;Pleasant mood;Requires cueing Oral Cavity - Dentition: Missing  dentition Oral Motor / Sensory Function: Impaired -  see Bedside swallow eval Self-Feeding Abilities: Able to feed self;Needs assist Patient Positioning: Upright in chair Baseline Vocal Quality: Low vocal intensity Volitional Cough: Weak Volitional Swallow: Able to elicit Anatomy: Within functional limits Pharyngeal Secretions: Not observed secondary MBS    Reason for Referral Objectively evaluate swallowing function   Oral Phase Oral Preparation/Oral Phase Oral Phase: Impaired Oral - Nectar Oral - Nectar Teaspoon: Right anterior bolus loss;Weak lingual manipulation;Reduced posterior propulsion;Delayed oral transit;Other (Comment) (unable to adequately transit small amount of liquid) Oral - Nectar Cup: Right anterior bolus loss;Weak lingual manipulation;Reduced posterior propulsion;Delayed oral transit Oral - Nectar Straw: Right anterior bolus loss;Weak lingual manipulation;Reduced posterior propulsion;Delayed oral transit Oral - Thin Oral - Thin Cup: Right anterior bolus loss;Weak lingual manipulation;Reduced posterior propulsion;Delayed oral transit Oral - Solids Oral - Puree: Right anterior bolus loss;Weak lingual manipulation;Reduced posterior propulsion;Delayed oral transit;Right pocketing in lateral sulci;Lingual/palatal residue;Piecemeal swallowing Oral - Mechanical Soft: Right anterior bolus loss;Weak lingual manipulation;Reduced posterior propulsion;Delayed oral transit;Right pocketing in lateral sulci;Lingual/palatal residue;Piecemeal swallowing;Impaired mastication   Pharyngeal Phase Pharyngeal Phase Pharyngeal Phase: Impaired Pharyngeal - Nectar Pharyngeal - Nectar Cup: Delayed swallow initiation;Reduced pharyngeal peristalsis;Reduced anterior laryngeal mobility;Reduced laryngeal elevation;Reduced tongue base retraction;Pharyngeal residue - valleculae;Pharyngeal residue - posterior pharnyx Penetration/Aspiration details (nectar cup): Material does not enter airway Pharyngeal -  Nectar Straw: Delayed swallow initiation;Reduced pharyngeal peristalsis;Reduced anterior laryngeal mobility;Reduced laryngeal elevation;Reduced tongue base retraction;Penetration/Aspiration during swallow;Pharyngeal residue - valleculae;Pharyngeal residue - posterior pharnyx Penetration/Aspiration details (nectar straw): Material enters airway, remains ABOVE vocal cords and not ejected out Pharyngeal - Thin Pharyngeal - Thin Cup: Delayed swallow initiation;Reduced pharyngeal peristalsis;Reduced anterior laryngeal mobility;Reduced laryngeal elevation;Reduced tongue base retraction;Penetration/Aspiration during swallow;Pharyngeal residue - valleculae;Pharyngeal residue - posterior pharnyx Penetration/Aspiration details (thin cup): Material enters airway, passes BELOW cords and not ejected out despite cough attempt by patient Pharyngeal - Solids Pharyngeal - Puree: Delayed swallow initiation;Reduced pharyngeal peristalsis;Reduced anterior laryngeal mobility;Reduced laryngeal elevation;Reduced tongue base retraction;Pharyngeal residue - valleculae;Pharyngeal residue - posterior pharnyx Penetration/Aspiration details (puree): Material does not enter airway Pharyngeal - Mechanical Soft: Delayed swallow initiation;Reduced pharyngeal peristalsis;Reduced anterior laryngeal mobility;Reduced laryngeal elevation;Reduced tongue base retraction;Pharyngeal residue - valleculae;Pharyngeal residue - posterior pharnyx  Cervical Esophageal Phase    GO    Cervical Esophageal Phase Cervical Esophageal Phase: [facility]        Germain Osgood, M.A. CCC-SLP [phone]   Germain Osgood 06/29/2014, 4:44 PM

## 2014-06-29 NOTE — Progress Notes (Signed)
I await therapy evaluations to assist with disposition planning and discussions. [phone]

## 2014-06-29 NOTE — Plan of Care (Signed)
Problem: SLP Dysphagia Goals Goal: Patient will demonstrate readiness for PO's Patient will demonstrate readiness for PO's and/or instrumental swallow study as evidenced by:  Outcome: Completed/Met Date Met:  06/29/14  Comments:  PO diet initiated.

## 2014-06-29 NOTE — Progress Notes (Signed)
I met with pt's wife and two daughters at bedside. We discussed an inpt rehab admission pending his medical workup completion and his initiation of therapy. I will follow up on Monday. [phone]

## 2014-06-29 NOTE — Progress Notes (Addendum)
STROKE TEAM PROGRESS NOTE   HISTORY [name] is an 80 y.o. male who has been feeling poorly for the past few days with a cough. Today went to see his PCP who felt that he had a pneumonia. When he returned home attempted to take a nap. Yelled out because he was having some difficulty with his left eye. It seems there was some pain and he fel that he got something in his eye but nothing was there. At that time the wife checked th patient and found no focal abnormalities. She then called a retired family friend over who checked the patient as well and found no focal abnormalities. The wife is not clear about what time that was but felt it was around [place] on 06/27/2014. He then had a few spoonfuls of cereal. When he attempted to get up to go to bed bed was having difficulty. His wife came to check him again and she noted right sided weakness. EMS was called at that time and the patient was brought in as a code stroke. Patient was not administered TPA secondary to inability  to determine LKW. He was admitted for further evaluation and treatment.   SUBJECTIVE (INTERVAL HISTORY) Family at bedside. Ba swallow planned for today. Will probably need inpatient rehab.   OBJECTIVE Temp:  [97.7 F (36.5 C)-98.6 F (37 C)] 98.6 F (37 C) (12/04 0953) Pulse Rate:  [63-77] 63 (12/04 0953) Cardiac Rhythm:  [-] Normal sinus rhythm (12/03 2000) Resp:  [18-20] 18 (12/04 0953) BP: (133-164)/(50-67) 134/59 mmHg (12/04 0953) SpO2:  [97 %-99 %] 97 % (12/04 0953)   Recent Labs Lab 06/28/14 1158 06/28/14 1637 06/28/14 2022 06/28/14 2354 06/29/14 0353  GLUCAP 98 88 87 82 94    Recent Labs Lab 06/27/14 2334 06/27/14 2349 06/28/14 0448 06/28/14 2010 06/29/14 0545  NA 131* 132* 134* 134* 137  K 3.2* 2.9* 2.9* 3.6* 3.9  CL 91* 97 92* 96 98  CO2 23  --  22 22 21   GLUCOSE 146* 132* 78 91 101*  BUN 31* 29* 27* 23 25*  CREATININE 0.94 0.90 0.80 0.82 0.83  CALCIUM 9.6  --  9.2 9.1 9.1  MG   --   --  2.3  --   --     Recent Labs Lab 06/27/14 2334 06/28/14 0448  AST 21 18  ALT 32 28  ALKPHOS 155* 149*  BILITOT 0.5 0.6  PROT 7.1 6.5  ALBUMIN 2.7* 2.5*    Recent Labs Lab 06/27/14 2334 06/27/14 2349 06/28/14 0448 06/29/14 0545  WBC 18.8*  --  17.6* 20.3*  NEUTROABS 15.9*  --  15.0*  --   HGB 12.1* 13.6 11.8* 11.8*  HCT 36.3* 40.0 35.8* 35.5*  MCV 85.4  --  86.7 86.6  PLT 381  --  376 344   No results for input(s): CKTOTAL, CKMB, CKMBINDEX, TROPONINI in the last 168 hours.  Recent Labs  06/27/14 2334  LABPROT 14.8  INR 1.15   No results for input(s): COLORURINE, LABSPEC, PHURINE, GLUCOSEU, HGBUR, BILIRUBINUR, KETONESUR, PROTEINUR, UROBILINOGEN, NITRITE, LEUKOCYTESUR in the last 72 hours.  Invalid input(s): APPERANCEUR     Component Value Date/Time   CHOL 139 06/28/2014 0448   TRIG 108 06/28/2014 0448   HDL 23* 06/28/2014 0448   CHOLHDL 6.0 06/28/2014 0448   VLDL 22 06/28/2014 0448   LDLCALC 94 06/28/2014 0448   Lab Results  Component Value Date   HGBA1C 5.9* 06/28/2014   No results found for: LABOPIA,  COCAINSCRNUR, LABBENZ, AMPHETMU, THCU, LABBARB  No results for input(s): ETH in the last 168 hours.  Ct Angio Head and Neck W/cm &/or Wo Cm 06/28/2014    CTA HEAD: Moderately motion degraded examination limits evaluation without large vessel occlusion. Mild luminal irregularity in a pattern suggesting atherosclerosis.   CTA NECK: Calcific atherosclerosis of the carotid bulbs without hemodynamically significant stenosis by NASCET criteria.   LEFT V1 and V2 intimal irregularity suggesting remote dissection without convincing evidence of acute vascular injury nor flow-limiting stenosis.       Dg Chest 2 View 06/27/2014    Right lower lobe and right middle lobe infiltrates consistent with pneumonia.      Ct Head (brain) Wo Contrast 06/27/2014    No evidence of acute intracranial abnormality.  Atrophy, chronic small-vessel white matter ischemic  changes and remote appearing right basal ganglia lacunar infarct.     MRI / MRA Brain Wo Contrast 06/28/2014    1. Acute nonhemorrhagic left paramedian pontine infarct corresponds with the patient's symptoms.  2. No acute supratentorial infarcts.  3. Extensive atrophy and white matter disease likely reflects the sequela of chronic microvascular ischemia.  4. Atherosclerotic changes in the vertebral arteries and basilar artery without a significant stenosis.  5. Mild distal small vessel attenuation throughout, worse in the posterior circulation.    Dg Chest Port 1 View 06/28/2014    Mildly worsening right basilar pneumonia noted.     Carotid Doppler  There is 1-39% bilateral ICA stenosis. Vertebral artery flow is antegrade.    2-D echocardiogram 06/28/2014  - Left ventricle: The cavity size was normal. There was mild focal basal hypertrophy of the septum. Systolic function was normal. The estimated ejection fraction was in the range of 55% to 60%. Doppler parameters are consistent with abnormal left ventricular relaxation (grade 1 diastolic dysfunction).  - No cardiac source of emboli was indentified.   PHYSICAL EXAM Frail elderly Caucasian male on nasal canula oxygen. . Afebrile. Head is nontraumatic. Neck is supple without bruit.   Cardiac exam no murmur or gallop. Lungs are clear to auscultation. Distal pulses are well felt. Neurological Exam : Awake alert severe dysarthria but can be understood with some difficulty. No aphasia. Follows midline and simple one-step commands. Right gaze preference but can look to the left past midline. Fundi were not visualized. Vision acuity seems adequate. Face is asymmetrical with moderate weakness of the right lower face. Tongue midline. Weak cough and gag. Dense right hemiplegia with hypotonia and 0/5 strength. Purposeful antigravity movements on the left side without weakness. Slight diminished right hemibody sensation. Normal sensation  and coordination on the left. Right plantar upgoing left downgoing. Gait was not tested.    ASSESSMENT/PLAN Mr. BREYDEN JEUDY is a 80 y.o. male with history of hypertension and prostate cancer presenting with right sided weakness in the setting of community acquired pneumonia. He did not receive IV t-PA due to inability  to determine LKW.   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 EF 55-60%.  HgbA1c 5.9  Lovenox 40 mg sq daily for VTE prophylaxis    NPO. Awaiting ST to evaluate. Modified barium swallow planned when patient able to participate.   no  antithrombotics prior to admission, now on aspirin suppository daily  Ongoing aggressive stroke risk factor management Dr. Leonie Man discussed diagnosis, prognosis,  treatment options and plan of care with pt, wife and friend at bedside.   Anticipate slow recovery  Therapy recommendations:  pending   Disposition:  Possible inpatient rehabilitation admission.  Hypertension  Permissive hypertension (OK if < 220/120) but gradually normalize in 5-7 days Stable  Hyperlipidemia  Home meds:  No statin  LDL 94, goal < 70  Add statin once able to swallow  Diabetes  HgbA1c 5.9  goal < 7.0  Other Stroke Risk Factors  Advanced age  Former Cigarette smoker  ETOH use  Community acquired vs aspiration pneumonia  On Levaquin  Other Active Problems  Abnormal HR, first depgree AV block per card, repeat EKG done  Hypokalemia/dehydration  Normocytic anemia  Other Pertinent History  Prostate cancer  Stroke team will sign off. Please call if we can be of further service.  Hospital day # 2   Mikey Bussing PA-C [facility] Pager [phone] 06/29/2014, 3:37 PM    I have personally examined this patient, reviewed notes, independently viewed imaging studies, participated in medical decision making and plan of care. I have made any additions or clarifications directly to the above note. Agree with note above. I had a long discussion with the patient's wife and daughter regarding his pontine infarct and significant dense hemiplegia and dysarthria and answered questions.. Anticipate slow recovery requiring prolonged rehabilitation  And transfer there early next week. Stroke team will sign off. Kindly call for questions. Antony Contras, MD  To contact Stroke Continuity provider, please refer to [email or web address] After hours, contact General Neurology

## 2014-06-29 NOTE — Progress Notes (Signed)
[facility] PROGRESS NOTE  [name] GEX:[id number] DOB: [birth year] DOA: 06/27/2014  PCP: Unknown  Brief HPI: [name] is a 80 y.o. male with history of hypertension and prostate cancer. He had a productive cough for few days prior to admission. Patient had gone to his PCP on day of admission and was prescribed antibiotics for possible pneumonia. Later patient's family called EMS as they found that patient was having right facial droop with right-sided hemiplegia and was brought to the ER.   Past medical history:  Past Medical History  Diagnosis Date  . Hypertension   . Cancer     Consultants: Neurology  Procedures:  2D ECHO Study Conclusions - Left ventricle: The cavity size was normal. There was mild focalbasal hypertrophy of the septum. Systolic function was normal.The estimated ejection fraction was in the range of 55% to 60%.Doppler parameters are consistent with abnormal left ventricularrelaxation (grade 1 diastolic dysfunction). Impressions: - No cardiac source of emboli was indentified.  Carotid Doppler Preliminary report: 1-39% ICA stenosis. Vertebral artery flow is antegrade.   Antibiotics: Levaquin 12/3-->  Subjective: Patient continues to have significant dysarthria. Wife at bedside. Unable to move right side of his body. Slightly distracted this morning.  Objective: Vital Signs  Filed Vitals:   06/28/14 1817 06/28/14 2113 06/29/14 0119 06/29/14 0600  BP: 160/67 164/63 152/65 147/50  Pulse: 73 77 74 67  Temp: 97.7 F (36.5 C) 98 F (36.7 C) 98.2 F (36.8 C) 98.2 F (36.8 C)  TempSrc: Oral Oral Oral Oral  Resp: 20 18 18 18   Height:      Weight:      SpO2: 99% 99% 97% 97%    Intake/Output Summary (Last 24 hours) at 06/29/14 0813 Last data filed at 06/29/14 0102  Gross per 24 hour  Intake      0 ml  Output    500 ml  Net   -500 ml   Filed Weights   06/28/14 0223  Weight: 68.04 kg (150 lb)    General appearance: alert,  cooperative and no distress Resp: decreased air entry at bases. few crackles right base. Cardio: regular rate and rhythm, S1, S2 normal, no murmur, click, rub or gallop GI: soft, non-tender; bowel sounds normal; no masses,  no organomegaly Extremities: extremities normal, atraumatic, no cyanosis or edema Neurologic: Right hemiparesis. Right facial droop.  Lab Results:  Basic Metabolic Panel:  Recent Labs Lab 06/27/14 2334 06/27/14 2349 06/28/14 0448 06/28/14 2010 06/29/14 0545  NA 131* 132* 134* 134* 137  K 3.2* 2.9* 2.9* 3.6* 3.9  CL 91* 97 92* 96 98  CO2 23  --  22 22 21   GLUCOSE 146* 132* 78 91 101*  BUN 31* 29* 27* 23 25*  CREATININE 0.94 0.90 0.80 0.82 0.83  CALCIUM 9.6  --  9.2 9.1 9.1  MG  --   --  2.3  --   --    Liver Function Tests:  Recent Labs Lab 06/27/14 2334 06/28/14 0448  AST 21 18  ALT 32 28  ALKPHOS 155* 149*  BILITOT 0.5 0.6  PROT 7.1 6.5  ALBUMIN 2.7* 2.5*   CBC:  Recent Labs Lab 06/27/14 2334 06/27/14 2349 06/28/14 0448 06/29/14 0545  WBC 18.8*  --  17.6* 20.3*  NEUTROABS 15.9*  --  15.0*  --   HGB 12.1* 13.6 11.8* 11.8*  HCT 36.3* 40.0 35.8* 35.5*  MCV 85.4  --  86.7 86.6  PLT 381  --  376 344  CBG:  Recent Labs Lab 06/28/14 1158 06/28/14 1637 06/28/14 2022 06/28/14 2354 06/29/14 0353  GLUCAP 98 88 87 82 94     Studies/Results: Ct Angio Head W/cm &/or Wo Cm  06/28/2014   CLINICAL DATA:  Nursing home patient, last seen at baseline earlier today, new onset RIGHT-sided hemi paresis. Recent diagnosis of pneumonia.  EXAM: CT ANGIOGRAPHY HEAD AND NECK  TECHNIQUE: Multidetector CT imaging of the head and neck was performed using the standard protocol during bolus administration of intravenous contrast. Multiplanar CT image reconstructions and MIPs were obtained to evaluate the vascular anatomy. Carotid stenosis measurements (when applicable) are obtained utilizing NASCET criteria, using the distal internal carotid diameter as the  denominator.  CONTRAST:  133mL OMNIPAQUE IOHEXOL 350 MG/ML SOLN  COMPARISON:  CT of the head June 27, 2014 at  FINDINGS: CTA HEAD FINDINGS  Moderately motion degraded evaluation at the skullbase and, circle of Willis. Anterior circulation: Normal appearance of the cervical internal carotid arteries, petrous, cavernous and supra clinoid internal carotid arteries. Widely patent anterior communicating artery. Normal appearance of the anterior and middle cerebral arteries.  Posterior circulation: LEFT vertebral artery is dominant with normal appearance of the vertebral arteries, vertebrobasilar junction and basilar artery. Due to motion, limited assessment of the basilar artery and branch vessels. Normal appearance of the posterior cerebral arteries.  Mild luminal irregularity of the mid to distal intracranial arteries most consistent with atherosclerosis No large vessel occlusion, hemodynamically significant stenosis, dissection, contrast extravasation or aneurysm within the anterior nor posterior circulation.  No abnormal intraparenchymal or extra-axial enhancement.  Review of the MIP images confirms the above findings.  CTA NECK FINDINGS  Normal appearance of the thoracic arch, normal branch pattern. Mild calcific atherosclerosis of the aortic arch. The origins of the innominate, left Common carotid artery and subclavian artery are widely patent. Streak artifact from retained LEFT subclavian venous contrast results in spurious hypodensity LEFT Common carotid artery proximally.  Bilateral Common carotid arteries are widely patent, coursing in a straight line fashion. Normal appearance of the carotid bifurcations without hemodynamically significant stenosis by NASCET criteria. Normal appearance of the included internal carotid arteries.  Left vertebral artery is dominant. Calcific atherosclerosis of the LEFT vertebral artery origin without hemodynamically significant stenosis. Tortuous LEFT vertebral artery V1  segment, with focal luminal irregularity and narrowing, coronal 74/239 and, slight poststenotic dilatation. Mild irregularity of the LEFT V2 segment, coronal 70/239.  No hemodynamically significant stenosis by NASCET criteria. No dissection, no pseudoaneurysm. No abnormal luminal irregularity. No contrast extravasation.  Soft tissues are nonsuspicious. No acute osseous process though bone windows have not been submitted. Included view of the chest demonstrate centrilobular emphysema and calcified apical pleural plaques with fibronodular scarring.  Review of the MIP images confirms the above findings.  IMPRESSION: CTA HEAD: Moderately motion degraded examination limits evaluation without large vessel occlusion. Mild luminal irregularity in a pattern suggesting atherosclerosis.  CTA NECK: Calcific atherosclerosis of the carotid bulbs without hemodynamically significant stenosis by NASCET criteria.  LEFT V1 and V2 intimal irregularity suggesting remote dissection without convincing evidence of acute vascular injury nor flow-limiting stenosis.   Electronically Signed   By: Elon Alas   On: 06/28/2014 01:02   Dg Chest 2 View  06/27/2014   CLINICAL DATA:  Productive cough.  EXAM: CHEST  2 VIEW  COMPARISON:  08/11/2011.  FINDINGS: Mediastinum and hilar structures normal. Right lower in the low lobe infiltrate consistent with pneumonia. Left lung is clear. Biapical pleural parenchymal thickening consistent scarring. Heart  size normal. No acute bony abnormality. Degenerative changes thoracic spine.  IMPRESSION: Right lower lobe and right middle lobe infiltrates consistent with pneumonia.   Electronically Signed   By: Marcello Moores  Register   On: 06/27/2014 12:47   Ct Head (brain) Wo Contrast  06/27/2014   CLINICAL DATA:  80 year old male with acute right arm weakness and facial droop. Code stroke. Initial encounter.  EXAM: CT HEAD WITHOUT CONTRAST  TECHNIQUE: Contiguous axial images were obtained from the base of the  skull through the vertex without intravenous contrast.  COMPARISON:  None.  FINDINGS: Mild cerebral atrophy and moderate -severe chronic appearing small-vessel white matter ischemic changes are identified.  A right basal ganglia lacunar infarct appears remote.  No acute intracranial abnormalities are identified, including mass lesion or mass effect, hydrocephalus, extra-axial fluid collection, midline shift, hemorrhage, or acute infarction.  The visualized bony calvarium is unremarkable.  Right maxillary sinus mucosal thickening is noted.  IMPRESSION: No evidence of acute intracranial abnormality.  Atrophy, chronic small-vessel white matter ischemic changes and remote appearing right basal ganglia lacunar infarct.  Critical Value/emergent results were called by telephone at the time of interpretation on 06/27/2014 at 11:56 pm to Dr. Julianne Rice , who verbally acknowledged these results.   Electronically Signed   By: Hassan Rowan M.D.   On: 06/27/2014 23:57   Ct Angio Neck W/cm &/or Wo/cm  06/28/2014   CLINICAL DATA:  Nursing home patient, last seen at baseline earlier today, new onset RIGHT-sided hemi paresis. Recent diagnosis of pneumonia.  EXAM: CT ANGIOGRAPHY HEAD AND NECK  TECHNIQUE: Multidetector CT imaging of the head and neck was performed using the standard protocol during bolus administration of intravenous contrast. Multiplanar CT image reconstructions and MIPs were obtained to evaluate the vascular anatomy. Carotid stenosis measurements (when applicable) are obtained utilizing NASCET criteria, using the distal internal carotid diameter as the denominator.  CONTRAST:  137mL OMNIPAQUE IOHEXOL 350 MG/ML SOLN  COMPARISON:  CT of the head June 27, 2014 at  FINDINGS: CTA HEAD FINDINGS  Moderately motion degraded evaluation at the skullbase and, circle of Willis. Anterior circulation: Normal appearance of the cervical internal carotid arteries, petrous, cavernous and supra clinoid internal carotid arteries.  Widely patent anterior communicating artery. Normal appearance of the anterior and middle cerebral arteries.  Posterior circulation: LEFT vertebral artery is dominant with normal appearance of the vertebral arteries, vertebrobasilar junction and basilar artery. Due to motion, limited assessment of the basilar artery and branch vessels. Normal appearance of the posterior cerebral arteries.  Mild luminal irregularity of the mid to distal intracranial arteries most consistent with atherosclerosis No large vessel occlusion, hemodynamically significant stenosis, dissection, contrast extravasation or aneurysm within the anterior nor posterior circulation.  No abnormal intraparenchymal or extra-axial enhancement.  Review of the MIP images confirms the above findings.  CTA NECK FINDINGS  Normal appearance of the thoracic arch, normal branch pattern. Mild calcific atherosclerosis of the aortic arch. The origins of the innominate, left Common carotid artery and subclavian artery are widely patent. Streak artifact from retained LEFT subclavian venous contrast results in spurious hypodensity LEFT Common carotid artery proximally.  Bilateral Common carotid arteries are widely patent, coursing in a straight line fashion. Normal appearance of the carotid bifurcations without hemodynamically significant stenosis by NASCET criteria. Normal appearance of the included internal carotid arteries.  Left vertebral artery is dominant. Calcific atherosclerosis of the LEFT vertebral artery origin without hemodynamically significant stenosis. Tortuous LEFT vertebral artery V1 segment, with focal luminal irregularity and narrowing, coronal  74/239 and, slight poststenotic dilatation. Mild irregularity of the LEFT V2 segment, coronal 70/239.  No hemodynamically significant stenosis by NASCET criteria. No dissection, no pseudoaneurysm. No abnormal luminal irregularity. No contrast extravasation.  Soft tissues are nonsuspicious. No acute osseous  process though bone windows have not been submitted. Included view of the chest demonstrate centrilobular emphysema and calcified apical pleural plaques with fibronodular scarring.  Review of the MIP images confirms the above findings.  IMPRESSION: CTA HEAD: Moderately motion degraded examination limits evaluation without large vessel occlusion. Mild luminal irregularity in a pattern suggesting atherosclerosis.  CTA NECK: Calcific atherosclerosis of the carotid bulbs without hemodynamically significant stenosis by NASCET criteria.  LEFT V1 and V2 intimal irregularity suggesting remote dissection without convincing evidence of acute vascular injury nor flow-limiting stenosis.   Electronically Signed   By: Elon Alas   On: 06/28/2014 01:02   Mr Brain Wo Contrast  06/28/2014   CLINICAL DATA:  Right-sided facial droop and weakness beginning yesterday. NIH stroke scale of 14.  EXAM: MRI HEAD WITHOUT CONTRAST  MRA HEAD WITHOUT CONTRAST  TECHNIQUE: Multiplanar, multiecho pulse sequences of the brain and surrounding structures were obtained without intravenous contrast. Angiographic images of the head were obtained using MRA technique without contrast.  COMPARISON:  CT head without contrast 06/27/2014. CTA head and neck 06/28/2014.  FINDINGS: MRI HEAD FINDINGS  Diffusion-weighted images demonstrate an acute nonhemorrhagic left paramedian pontine infarct measures 9 x 13 mm. T2 changes are associated with the area of acute infarction. Study is mildly degraded by patient motion.  Extensive confluent periventricular and subcortical white matter changes are present in addition. Remote lacunar infarcts are evident in the basal ganglia bilaterally. There is a remote lacunar infarct in the subcortical left parietal lobe white matter as well.  The ventricles are proportionate to the degree of atrophy. No significant extraaxial fluid collection is present.  Flow is present in the major intracranial arteries. The patient is  status post bilateral lens replacements. The right maxillary sinus is mostly opacified. Mild mucosal thickening is worse on the right ethmoid air cells. The mastoid air cells are clear.  MRA HEAD FINDINGS  The A1 and M1 segments are normal. Anterior communicating artery is patent. The A2 segments are duplicated on the left, a normal variant. The MCA bifurcations are within normal limits. The branch vessels demonstrate mild distal small vessel attenuation.  The left vertebral artery is slightly dominant to the right. Mild atherosclerotic changes are noted within the this vertebral arteries without a significant stenosis relative to the more distal vessel. There is mild narrowing of the distal basilar artery without a significant stenosis. Both posterior cerebral arteries originate from the basilar tip. There is some attenuation of distal PCA branch vessels bilaterally, worse on the right.  IMPRESSION: 1. Acute nonhemorrhagic left paramedian pontine infarct corresponds with the patient's symptoms. 2. No acute supratentorial infarcts. 3. Extensive atrophy and white matter disease likely reflects the sequela of chronic microvascular ischemia. 4. Atherosclerotic changes in the vertebral arteries and basilar artery without a significant stenosis. 5. Mild distal small vessel attenuation throughout, worse in the posterior circulation. These results will be called to the ordering clinician or representative by the Radiologist Assistant, and communication documented in the PACS or zVision Dashboard.   Electronically Signed   By: Lawrence Santiago M.D.   On: 06/28/2014 10:07   Dg Chest Port 1 View  06/28/2014   CLINICAL DATA:  Acute onset of weakness.  Initial encounter.  EXAM: PORTABLE CHEST -  1 VIEW  COMPARISON:  Chest radiograph performed 06/27/2014  FINDINGS: The lungs are well-aerated. Worsening right basilar airspace opacification is compatible with pneumonia. There is no evidence of pleural effusion or pneumothorax.   The cardiomediastinal silhouette is within normal limits. No acute osseous abnormalities are seen.  IMPRESSION: Mildly worsening right basilar pneumonia noted.   Electronically Signed   By: Garald Balding M.D.   On: 06/28/2014 01:38   Mr Jodene Nam Head/brain Wo Cm  06/28/2014   CLINICAL DATA:  Right-sided facial droop and weakness beginning yesterday. NIH stroke scale of 14.  EXAM: MRI HEAD WITHOUT CONTRAST  MRA HEAD WITHOUT CONTRAST  TECHNIQUE: Multiplanar, multiecho pulse sequences of the brain and surrounding structures were obtained without intravenous contrast. Angiographic images of the head were obtained using MRA technique without contrast.  COMPARISON:  CT head without contrast 06/27/2014. CTA head and neck 06/28/2014.  FINDINGS: MRI HEAD FINDINGS  Diffusion-weighted images demonstrate an acute nonhemorrhagic left paramedian pontine infarct measures 9 x 13 mm. T2 changes are associated with the area of acute infarction. Study is mildly degraded by patient motion.  Extensive confluent periventricular and subcortical white matter changes are present in addition. Remote lacunar infarcts are evident in the basal ganglia bilaterally. There is a remote lacunar infarct in the subcortical left parietal lobe white matter as well.  The ventricles are proportionate to the degree of atrophy. No significant extraaxial fluid collection is present.  Flow is present in the major intracranial arteries. The patient is status post bilateral lens replacements. The right maxillary sinus is mostly opacified. Mild mucosal thickening is worse on the right ethmoid air cells. The mastoid air cells are clear.  MRA HEAD FINDINGS  The A1 and M1 segments are normal. Anterior communicating artery is patent. The A2 segments are duplicated on the left, a normal variant. The MCA bifurcations are within normal limits. The branch vessels demonstrate mild distal small vessel attenuation.  The left vertebral artery is slightly dominant to the  right. Mild atherosclerotic changes are noted within the this vertebral arteries without a significant stenosis relative to the more distal vessel. There is mild narrowing of the distal basilar artery without a significant stenosis. Both posterior cerebral arteries originate from the basilar tip. There is some attenuation of distal PCA branch vessels bilaterally, worse on the right.  IMPRESSION: 1. Acute nonhemorrhagic left paramedian pontine infarct corresponds with the patient's symptoms. 2. No acute supratentorial infarcts. 3. Extensive atrophy and white matter disease likely reflects the sequela of chronic microvascular ischemia. 4. Atherosclerotic changes in the vertebral arteries and basilar artery without a significant stenosis. 5. Mild distal small vessel attenuation throughout, worse in the posterior circulation. These results will be called to the ordering clinician or representative by the Radiologist Assistant, and communication documented in the PACS or zVision Dashboard.   Electronically Signed   By: Lawrence Santiago M.D.   On: 06/28/2014 10:07    Medications:  Scheduled: . aspirin  300 mg Rectal Daily   Or  . aspirin  325 mg Oral Daily  . enoxaparin (LOVENOX) injection  40 mg Subcutaneous Daily  . levofloxacin (LEVAQUIN) IV  750 mg Intravenous Daily   Continuous: . 0.9 % NaCl with KCl 20 mEq / L 75 mL/hr at 06/28/14 1132   [id number]  Assessment/Plan:  Principal Problem:   Stroke Active Problems:   CAP (community acquired pneumonia)   Normocytic anemia   Essential hypertension   Hypokalemia    Acute CVA with right-sided hemiplegia Neurology following  and managing. NPO for swallow evaluation. Stroke work up in progress. PT/OT. Unclear if he was on aspirin prior to admission. Now on Asprin. Will need statin when able to take orally. Await MBS. Might need tube feding if he remains high risk for any diet. Briefly spoke with wife about this.  Abnormal rhythm Repeat EKG  shows sinus rhyhm. No evidence for atrial fibrillation. First EKG shows artifacts and was discussed with on-call cardiologist. Telemetry shows first degree AV block. No further evaluation.  Pneumonia: Community Acquired versus Aspiration Continue Levaquin. O2 as needed.    Hypokalemia/Dehydration Potassium level is better. IVF to continue. Magnesium level was normal.  Normocytic Anemia Closely follow CBC. Stable so far.   History of Essential Hypertension Currently allowing for permissive hypertension due to acute stroke.  History of prostate cancer. Stable  DVT Prophylaxis: Lovenox    Code Status: Full Code  Family Communication: Discussed with patient, his wife and his daughter  Disposition Plan: Not ready for DC. CIR to evaluate. PT/OT pending.    LOS: 2 days   [facility] Pager [phone] 06/29/2014, 8:13 AM  If 8PM-8AM, please contact night-coverage at [email or web address], password [facility]

## 2014-06-29 NOTE — Progress Notes (Signed)
Medicare Important Message given? YES  (If response is "NO", the following Medicare IM given date fields will be blank)  Date Medicare IM given:  06/29/2014 Medicare IM given by: Lenox Ponds RN CCM Case Mgmt phone [phone]

## 2014-06-29 NOTE — Evaluation (Signed)
Physical Therapy Evaluation Patient Details Name: FABRIZZIO MARCELLA MRN: [id number] DOB: June 19, 1926 Today's Date: 06/29/2014   History of Present Illness  80 yo male L handed admitted with R side weakness, R gaze preference, severe dysarthria and cognitive deficits. NIH=14 MRI(+) acute nonhemorrhagic L paramedian pontine infarct PMH: prostate CA, HTN, PNA(06/27/14)   Clinical Impression  Pt was seen with wife and daughter to discuss his physical function and needs moving forward.  Fairly weak to get to bedside and demonstrating some signs of Pusher syndrome as well.  He is appropriate to screen for CIR as he is not actively usign R side, but has been down for testing and is tired.    Follow Up Recommendations CIR    Equipment Recommendations  Rolling walker with 5" wheels    Recommendations for Other Services Rehab consult     Precautions / Restrictions Precautions Precautions: Fall Precaution Comments: R side weakness and neglect Restrictions Weight Bearing Restrictions: No      Mobility  Bed Mobility Overal bed mobility: Needs Assistance Bed Mobility: Sidelying to Sit   Sidelying to sit: Max assist       General bed mobility comments: pt cannot assist with R side and does not marshall L side effectively  Transfers Overall transfer level: Needs assistance Equipment used: None Transfers:  (declined to attempt)           General transfer comment: Pt too weak and fatigued from his day to stand  Ambulation/Gait             General Gait Details: unable to walk  Stairs            Wheelchair Mobility    Modified Rankin (Stroke Patients Only) Modified Rankin (Stroke Patients Only) Pre-Morbid Rankin Score: No significant disability Modified Rankin: Moderately severe disability     Balance Overall balance assessment: Needs assistance Sitting-balance support: Feet supported;Single extremity supported Sitting balance-Leahy Scale: Poor                                        Pertinent Vitals/Pain Pain Assessment: No/denies pain    Home Living Family/patient expects to be discharged to:: Inpatient rehab                      Prior Function Level of Independence: Independent               Hand Dominance        Extremity/Trunk Assessment   Upper Extremity Assessment: RUE deficits/detail RUE Deficits / Details: dense R side weakness         Lower Extremity Assessment: RLE deficits/detail RLE Deficits / Details: dense R leg weakness    Cervical / Trunk Assessment: Normal  Communication   Communication: Expressive difficulties;Other (comment) (but could sing)  Cognition Arousal/Alertness: Lethargic Behavior During Therapy: Flat affect Overall Cognitive Status: Impaired/Different from baseline Area of Impairment: Following commands;Awareness;Problem solving;Memory     Memory: Decreased short-term memory Following Commands: Follows one step commands inconsistently   Awareness: Intellectual;Anticipatory Problem Solving: Slow processing;Decreased initiation;Difficulty sequencing;Requires verbal cues;Requires tactile cues General Comments: R side weakness with maybe beginning of pusher syndrome starting     General Comments General comments (skin integrity, edema, etc.): Pt in bed with residual material from his MBSS    Exercises        Assessment/Plan    PT Assessment Patient needs continued PT  services  PT Diagnosis Generalized weakness   PT Problem List Decreased strength;Decreased range of motion;Decreased activity tolerance;Decreased balance;Decreased mobility;Decreased coordination;Decreased cognition;Decreased knowledge of use of DME;Decreased safety awareness;Decreased knowledge of precautions;Cardiopulmonary status limiting activity  PT Treatment Interventions DME instruction;Gait training;Stair training;Functional mobility training;Therapeutic activities;Therapeutic  exercise;Balance training;Neuromuscular re-education;Patient/family education   PT Goals (Current goals can be found in the Care Plan section) Acute Rehab PT Goals Patient Stated Goal: did not state PT Goal Formulation: With patient/family Time For Goal Achievement: 07/13/14 Potential to Achieve Goals: Fair    Frequency Min 3X/week   Barriers to discharge Inaccessible home environment Pt is not able to stand at this time    Co-evaluation               End of Session Equipment Utilized During Treatment: Gait belt Activity Tolerance: Patient limited by fatigue;Patient limited by lethargy Patient left: in bed;with call bell/phone within reach;with bed alarm set;with family/visitor present Nurse Communication: Mobility status         Time: 1634-1700 PT Time Calculation (min) (ACUTE ONLY): 26 min   Charges:   PT Evaluation $Initial PT Evaluation Tier I: 1 Procedure PT Treatments $Therapeutic Activity: 8-22 mins   PT G Codes:          Ramond Dial Jul 06, 2014, 6:14 PM  Mee Hives, PT MS Acute Rehab Dept. Number: [phone]

## 2014-06-29 NOTE — Progress Notes (Signed)
Speech Language Pathology Treatment: Dysphagia;Cognitive-Linquistic  Patient Details Name: [name] MRN: [id number] DOB: 11-07-1925 Today's Date: 06/29/2014 Time: 5462-7035 SLP Time Calculation (min) (ACUTE ONLY): 31 min  Assessment / Plan / Recommendation Clinical Impression  Pt is more conversant today, even singing with family members. With Max cues from SLP to pause in between words and/or begin to spell words, he is able to increase his intelligibility at the word and short phrase level. Vocal intensity is mildly improved from yesterday, although continues to diminish with attempts at longer utterances due to decreased breath support. Today, pt requires Min cues to follow simple one-step commands.  SLP provided oral care via suctioning prior to administration of ice chip, water, and applesauce trials. Pt shows increased automaticity for swallow initiation with purees, although still with suspected delay and immediate throat clearing with thin liquids. SLP provided Mod cues for use of lingual sweep to clear residuals. Recommend to proceed with MBS to determine least restrictive diet.   HPI HPI: [name] is a 80 y.o. male with history of hypertension and prostate cancer. He had a productive cough for few days prior to admission. Patient had gone to his PCP on day of admission and was prescribed antibiotics for possible pneumonia. Later patient's family called EMS as they found that patient was having right facial droop with right-sided hemiplegia and was brought to the ER. MRI shows an acute nonhemorrhagic left paramedian pontine infarct.   Pertinent Vitals Pain Assessment: No/denies pain  SLP Plan  MBS    Recommendations Diet recommendations: NPO Medication Administration: Via alternative means              Oral Care Recommendations: Oral care Q4 per protocol Follow up Recommendations: Inpatient Rehab;24 hour supervision/assistance Plan: MBS    GO      Germain Osgood, M.A. CCC-SLP [phone]  Germain Osgood 06/29/2014, 11:39 AM

## 2014-06-29 NOTE — Consult Note (Signed)
Physical Medicine and Rehabilitation Consult   Reason for Consult: Right sided weakness, right gaze preference, severe dysarthria as well as cognitive deficits Referring Physician:  Dr. Maryland Pink.    HPI: [name] is a 80 y.o. left handed male with history of HTN, prostate cancer, recent PNA who was admitted on 06/27/14 with right sided weakness with right gaze preference and difficulty talking. Patient was noted to be in A fib in setting of CAP and CT head without acute changes. NIHSS- 14. 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 2D echo with EF 55-60% with mild focal basal atrophy and grade 1 diastolic dysfunction. Carotid dopplers without significant stenosis. 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 ST evaluation done yesterday and patient remains NPO due to lethargy with delay in swallow initiation as well as signs of aspiration. Speech/language evaluation revals moderate to severe dysarthria with perseveration, echolalia, impaired cognition as well as difficulty following one step commands. MD, ST and family recommending CIR for follow up therapy.    Review of Systems  Unable to perform ROS: mental acuity     Past Medical History  Diagnosis Date  . Hypertension   . Cancer     Past Surgical History  Procedure Laterality Date  . Appendectomy    . Kidney surgery      Family History  Problem Relation Age of Onset  . CAD Father     Social History: Married. Wife elderly and uses cane to get around.  Per reports that he has quit smoking in 1959. Marland Kitchen He does not have any smokeless tobacco history on file. He reports that he drinks alcohol daily. He reports that he does not use illicit drugs.    Allergies: No  Known Allergies    Medications Prior to Admission  Medication Sig Dispense Refill  . hydrochlorothiazide (HYDRODIURIL) 25 MG tablet Take 25 mg by mouth daily.       Home: Home Living Family/patient expects to be discharged to:: Skilled nursing facility  Lives With: Spouse  Functional History:   Functional Status:  Mobility:          ADL:    Cognition: Cognition Overall Cognitive Status: Impaired/Different from baseline Arousal/Alertness: Lethargic Orientation Level: Oriented to person, Disoriented to place, Disoriented to time, Disoriented to situation Attention: Sustained Sustained Attention: Impaired Sustained Attention Impairment: Verbal basic Memory: Impaired Memory Impairment: Decreased recall of new information Problem Solving: Impaired Problem Solving Impairment: Functional basic Safety/Judgment: Impaired Cognition Overall Cognitive Status: Impaired/Different from baseline  Blood pressure 147/50, pulse 67, temperature 98.2 F (36.8 C), temperature source Oral, resp. rate 18, height 5\' 9"  (1.753 m), weight 68.04 kg (150 lb), SpO2 97 %. Physical Exam  Nursing note and vitals reviewed. Constitutional: He appears well-developed and well-nourished. He appears lethargic. He is easily aroused.  HENT:  Head: Normocephalic and atraumatic.  Dry oral mucosa.   Eyes: Conjunctivae are normal. Pupils are equal, round, and reactive to light.  Neck: Normal range of motion. Neck supple.  Cardiovascular: Normal rate and regular rhythm.   No murmur heard. Respiratory: Effort normal. No respiratory distress. He has decreased breath sounds. He has no wheezes.  GI: Soft. Bowel sounds are normal. He exhibits no distension. There is no tenderness.  Musculoskeletal: He exhibits no edema  or tenderness.  Neurological: He is easily aroused. He appears lethargic.  Severe dysarthria with right facial weakness. Oriented to self and able to state age and DOB. Able to state place and  situation with choice of two.  Dense right hemiparesis with emerging tone RLE. Senses pain in both arm and leg.  Lacks insight and awareness of deficits. Was able to follow simple one step motor commands with cues.   Skin: Skin is warm and dry.  Psychiatric:  Cooperative when able to participate in exam.      Results for orders placed or performed during the hospital encounter of 06/27/14 (from the past 24 hour(s))  Glucose, capillary     Status: None   Collection Time: 06/28/14 11:58 AM  Result Value Ref Range   Glucose-Capillary 98 70 - 99 mg/dL   Comment 1 Notify RN    Comment 2 Documented in Chart   Glucose, capillary     Status: None   Collection Time: 06/28/14  4:37 PM  Result Value Ref Range   Glucose-Capillary 88 70 - 99 mg/dL  Basic metabolic panel     Status: Abnormal   Collection Time: 06/28/14  8:10 PM  Result Value Ref Range   Sodium 134 (L) 137 - 147 mEq/L   Potassium 3.6 (L) 3.7 - 5.3 mEq/L   Chloride 96 96 - 112 mEq/L   CO2 22 19 - 32 mEq/L   Glucose, Bld 91 70 - 99 mg/dL   BUN 23 6 - 23 mg/dL   Creatinine, Ser 0.82 0.50 - 1.35 mg/dL   Calcium 9.1 8.4 - 10.5 mg/dL   GFR calc non Af Amer 78 (L) >90 mL/min   GFR calc Af Amer >90 >90 mL/min   Anion gap 16 (H) 5 - 15  Glucose, capillary     Status: None   Collection Time: 06/28/14  8:22 PM  Result Value Ref Range   Glucose-Capillary 87 70 - 99 mg/dL  Glucose, capillary     Status: None   Collection Time: 06/28/14 11:54 PM  Result Value Ref Range   Glucose-Capillary 82 70 - 99 mg/dL  Glucose, capillary     Status: None   Collection Time: 06/29/14  3:53 AM  Result Value Ref Range   Glucose-Capillary 94 70 - 99 mg/dL  CBC     Status: Abnormal   Collection Time: 06/29/14  5:45 AM  Result Value Ref Range   WBC 20.3 (H) 4.0 - 10.5 K/uL   RBC 4.10 (L) 4.22 - 5.81 MIL/uL   Hemoglobin 11.8 (L) 13.0 - 17.0 g/dL   HCT 35.5 (L) 39.0 - 52.0 %   MCV 86.6 78.0 - 100.0 fL   MCH 28.8 26.0 - 34.0 pg   MCHC 33.2 30.0 -  36.0 g/dL   RDW 12.8 11.5 - 15.5 %   Platelets 344 150 - 400 K/uL  Basic metabolic panel     Status: Abnormal   Collection Time: 06/29/14  5:45 AM  Result Value Ref Range   Sodium 137 137 - 147 mEq/L   Potassium 3.9 3.7 - 5.3 mEq/L   Chloride 98 96 - 112 mEq/L   CO2 21 19 - 32 mEq/L   Glucose, Bld 101 (H) 70 - 99 mg/dL   BUN 25 (H) 6 - 23 mg/dL   Creatinine, Ser 0.83 0.50 - 1.35 mg/dL   Calcium 9.1 8.4 - 10.5 mg/dL   GFR calc non Af Amer 77 (L) >90 mL/min   GFR calc Af  Amer 90 (L) >90 mL/min   Anion gap 18 (H) 5 - 15   Ct Angio Head W/cm &/or Wo Cm  06/28/2014   CLINICAL DATA:  Nursing home patient, last seen at baseline earlier today, new onset RIGHT-sided hemi paresis. Recent diagnosis of pneumonia.  EXAM: CT ANGIOGRAPHY HEAD AND NECK  TECHNIQUE: Multidetector CT imaging of the head and neck was performed using the standard protocol during bolus administration of intravenous contrast. Multiplanar CT image reconstructions and MIPs were obtained to evaluate the vascular anatomy. Carotid stenosis measurements (when applicable) are obtained utilizing NASCET criteria, using the distal internal carotid diameter as the denominator.  CONTRAST:  130mL OMNIPAQUE IOHEXOL 350 MG/ML SOLN  COMPARISON:  CT of the head June 27, 2014 at  FINDINGS: CTA HEAD FINDINGS  Moderately motion degraded evaluation at the skullbase and, circle of Willis. Anterior circulation: Normal appearance of the cervical internal carotid arteries, petrous, cavernous and supra clinoid internal carotid arteries. Widely patent anterior communicating artery. Normal appearance of the anterior and middle cerebral arteries.  Posterior circulation: LEFT vertebral artery is dominant with normal appearance of the vertebral arteries, vertebrobasilar junction and basilar artery. Due to motion, limited assessment of the basilar artery and branch vessels. Normal appearance of the posterior cerebral arteries.  Mild luminal irregularity of the mid  to distal intracranial arteries most consistent with atherosclerosis No large vessel occlusion, hemodynamically significant stenosis, dissection, contrast extravasation or aneurysm within the anterior nor posterior circulation.  No abnormal intraparenchymal or extra-axial enhancement.  Review of the MIP images confirms the above findings.  CTA NECK FINDINGS  Normal appearance of the thoracic arch, normal branch pattern. Mild calcific atherosclerosis of the aortic arch. The origins of the innominate, left Common carotid artery and subclavian artery are widely patent. Streak artifact from retained LEFT subclavian venous contrast results in spurious hypodensity LEFT Common carotid artery proximally.  Bilateral Common carotid arteries are widely patent, coursing in a straight line fashion. Normal appearance of the carotid bifurcations without hemodynamically significant stenosis by NASCET criteria. Normal appearance of the included internal carotid arteries.  Left vertebral artery is dominant. Calcific atherosclerosis of the LEFT vertebral artery origin without hemodynamically significant stenosis. Tortuous LEFT vertebral artery V1 segment, with focal luminal irregularity and narrowing, coronal 74/239 and, slight poststenotic dilatation. Mild irregularity of the LEFT V2 segment, coronal 70/239.  No hemodynamically significant stenosis by NASCET criteria. No dissection, no pseudoaneurysm. No abnormal luminal irregularity. No contrast extravasation.  Soft tissues are nonsuspicious. No acute osseous process though bone windows have not been submitted. Included view of the chest demonstrate centrilobular emphysema and calcified apical pleural plaques with fibronodular scarring.  Review of the MIP images confirms the above findings.  IMPRESSION: CTA HEAD: Moderately motion degraded examination limits evaluation without large vessel occlusion. Mild luminal irregularity in a pattern suggesting atherosclerosis.  CTA NECK:  Calcific atherosclerosis of the carotid bulbs without hemodynamically significant stenosis by NASCET criteria.  LEFT V1 and V2 intimal irregularity suggesting remote dissection without convincing evidence of acute vascular injury nor flow-limiting stenosis.   Electronically Signed   By: Elon Alas   On: 06/28/2014 01:02   Dg Chest 2 View  06/27/2014   CLINICAL DATA:  Productive cough.  EXAM: CHEST  2 VIEW  COMPARISON:  08/11/2011.  FINDINGS: Mediastinum and hilar structures normal. Right lower in the low lobe infiltrate consistent with pneumonia. Left lung is clear. Biapical pleural parenchymal thickening consistent scarring. Heart size normal. No acute bony abnormality. Degenerative changes thoracic spine.  IMPRESSION: Right lower lobe and right middle lobe infiltrates consistent with pneumonia.   Electronically Signed   By: Marcello Moores  Register   On: 06/27/2014 12:47   Ct Head (brain) Wo Contrast  06/27/2014   CLINICAL DATA:  80 year old male with acute right arm weakness and facial droop. Code stroke. Initial encounter.  EXAM: CT HEAD WITHOUT CONTRAST  TECHNIQUE: Contiguous axial images were obtained from the base of the skull through the vertex without intravenous contrast.  COMPARISON:  None.  FINDINGS: Mild cerebral atrophy and moderate -severe chronic appearing small-vessel white matter ischemic changes are identified.  A right basal ganglia lacunar infarct appears remote.  No acute intracranial abnormalities are identified, including mass lesion or mass effect, hydrocephalus, extra-axial fluid collection, midline shift, hemorrhage, or acute infarction.  The visualized bony calvarium is unremarkable.  Right maxillary sinus mucosal thickening is noted.  IMPRESSION: No evidence of acute intracranial abnormality.  Atrophy, chronic small-vessel white matter ischemic changes and remote appearing right basal ganglia lacunar infarct.  Critical Value/emergent results were called by telephone at the time of  interpretation on 06/27/2014 at 11:56 pm to Dr. Julianne Rice , who verbally acknowledged these results.   Electronically Signed   By: Hassan Rowan M.D.   On: 06/27/2014 23:57   Ct Angio Neck W/cm &/or Wo/cm  06/28/2014   CLINICAL DATA:  Nursing home patient, last seen at baseline earlier today, new onset RIGHT-sided hemi paresis. Recent diagnosis of pneumonia.  EXAM: CT ANGIOGRAPHY HEAD AND NECK  TECHNIQUE: Multidetector CT imaging of the head and neck was performed using the standard protocol during bolus administration of intravenous contrast. Multiplanar CT image reconstructions and MIPs were obtained to evaluate the vascular anatomy. Carotid stenosis measurements (when applicable) are obtained utilizing NASCET criteria, using the distal internal carotid diameter as the denominator.  CONTRAST:  168mL OMNIPAQUE IOHEXOL 350 MG/ML SOLN  COMPARISON:  CT of the head June 27, 2014 at  FINDINGS: CTA HEAD FINDINGS  Moderately motion degraded evaluation at the skullbase and, circle of Willis. Anterior circulation: Normal appearance of the cervical internal carotid arteries, petrous, cavernous and supra clinoid internal carotid arteries. Widely patent anterior communicating artery. Normal appearance of the anterior and middle cerebral arteries.  Posterior circulation: LEFT vertebral artery is dominant with normal appearance of the vertebral arteries, vertebrobasilar junction and basilar artery. Due to motion, limited assessment of the basilar artery and branch vessels. Normal appearance of the posterior cerebral arteries.  Mild luminal irregularity of the mid to distal intracranial arteries most consistent with atherosclerosis No large vessel occlusion, hemodynamically significant stenosis, dissection, contrast extravasation or aneurysm within the anterior nor posterior circulation.  No abnormal intraparenchymal or extra-axial enhancement.  Review of the MIP images confirms the above findings.  CTA NECK FINDINGS   Normal appearance of the thoracic arch, normal branch pattern. Mild calcific atherosclerosis of the aortic arch. The origins of the innominate, left Common carotid artery and subclavian artery are widely patent. Streak artifact from retained LEFT subclavian venous contrast results in spurious hypodensity LEFT Common carotid artery proximally.  Bilateral Common carotid arteries are widely patent, coursing in a straight line fashion. Normal appearance of the carotid bifurcations without hemodynamically significant stenosis by NASCET criteria. Normal appearance of the included internal carotid arteries.  Left vertebral artery is dominant. Calcific atherosclerosis of the LEFT vertebral artery origin without hemodynamically significant stenosis. Tortuous LEFT vertebral artery V1 segment, with focal luminal irregularity and narrowing, coronal 74/239 and, slight poststenotic dilatation. Mild irregularity of the LEFT V2  segment, coronal 70/239.  No hemodynamically significant stenosis by NASCET criteria. No dissection, no pseudoaneurysm. No abnormal luminal irregularity. No contrast extravasation.  Soft tissues are nonsuspicious. No acute osseous process though bone windows have not been submitted. Included view of the chest demonstrate centrilobular emphysema and calcified apical pleural plaques with fibronodular scarring.  Review of the MIP images confirms the above findings.  IMPRESSION: CTA HEAD: Moderately motion degraded examination limits evaluation without large vessel occlusion. Mild luminal irregularity in a pattern suggesting atherosclerosis.  CTA NECK: Calcific atherosclerosis of the carotid bulbs without hemodynamically significant stenosis by NASCET criteria.  LEFT V1 and V2 intimal irregularity suggesting remote dissection without convincing evidence of acute vascular injury nor flow-limiting stenosis.   Electronically Signed   By: Elon Alas   On: 06/28/2014 01:02   Mr Brain Wo Contrast  06/28/2014    CLINICAL DATA:  Right-sided facial droop and weakness beginning yesterday. NIH stroke scale of 14.  EXAM: MRI HEAD WITHOUT CONTRAST  MRA HEAD WITHOUT CONTRAST  TECHNIQUE: Multiplanar, multiecho pulse sequences of the brain and surrounding structures were obtained without intravenous contrast. Angiographic images of the head were obtained using MRA technique without contrast.  COMPARISON:  CT head without contrast 06/27/2014. CTA head and neck 06/28/2014.  FINDINGS: MRI HEAD FINDINGS  Diffusion-weighted images demonstrate an acute nonhemorrhagic left paramedian pontine infarct measures 9 x 13 mm. T2 changes are associated with the area of acute infarction. Study is mildly degraded by patient motion.  Extensive confluent periventricular and subcortical white matter changes are present in addition. Remote lacunar infarcts are evident in the basal ganglia bilaterally. There is a remote lacunar infarct in the subcortical left parietal lobe white matter as well.  The ventricles are proportionate to the degree of atrophy. No significant extraaxial fluid collection is present.  Flow is present in the major intracranial arteries. The patient is status post bilateral lens replacements. The right maxillary sinus is mostly opacified. Mild mucosal thickening is worse on the right ethmoid air cells. The mastoid air cells are clear.  MRA HEAD FINDINGS  The A1 and M1 segments are normal. Anterior communicating artery is patent. The A2 segments are duplicated on the left, a normal variant. The MCA bifurcations are within normal limits. The branch vessels demonstrate mild distal small vessel attenuation.  The left vertebral artery is slightly dominant to the right. Mild atherosclerotic changes are noted within the this vertebral arteries without a significant stenosis relative to the more distal vessel. There is mild narrowing of the distal basilar artery without a significant stenosis. Both posterior cerebral arteries originate  from the basilar tip. There is some attenuation of distal PCA branch vessels bilaterally, worse on the right.  IMPRESSION: 1. Acute nonhemorrhagic left paramedian pontine infarct corresponds with the patient's symptoms. 2. No acute supratentorial infarcts. 3. Extensive atrophy and white matter disease likely reflects the sequela of chronic microvascular ischemia. 4. Atherosclerotic changes in the vertebral arteries and basilar artery without a significant stenosis. 5. Mild distal small vessel attenuation throughout, worse in the posterior circulation. These results will be called to the ordering clinician or representative by the Radiologist Assistant, and communication documented in the PACS or zVision Dashboard.   Electronically Signed   By: Lawrence Santiago M.D.   On: 06/28/2014 10:07   Dg Chest Port 1 View  06/28/2014   CLINICAL DATA:  Acute onset of weakness.  Initial encounter.  EXAM: PORTABLE CHEST - 1 VIEW  COMPARISON:  Chest radiograph performed 06/27/2014  FINDINGS:  The lungs are well-aerated. Worsening right basilar airspace opacification is compatible with pneumonia. There is no evidence of pleural effusion or pneumothorax.  The cardiomediastinal silhouette is within normal limits. No acute osseous abnormalities are seen.  IMPRESSION: Mildly worsening right basilar pneumonia noted.   Electronically Signed   By: Garald Balding M.D.   On: 06/28/2014 01:38   Mr Jodene Nam Head/brain Wo Cm  06/28/2014   CLINICAL DATA:  Right-sided facial droop and weakness beginning yesterday. NIH stroke scale of 14.  EXAM: MRI HEAD WITHOUT CONTRAST  MRA HEAD WITHOUT CONTRAST  TECHNIQUE: Multiplanar, multiecho pulse sequences of the brain and surrounding structures were obtained without intravenous contrast. Angiographic images of the head were obtained using MRA technique without contrast.  COMPARISON:  CT head without contrast 06/27/2014. CTA head and neck 06/28/2014.  FINDINGS: MRI HEAD FINDINGS  Diffusion-weighted images  demonstrate an acute nonhemorrhagic left paramedian pontine infarct measures 9 x 13 mm. T2 changes are associated with the area of acute infarction. Study is mildly degraded by patient motion.  Extensive confluent periventricular and subcortical white matter changes are present in addition. Remote lacunar infarcts are evident in the basal ganglia bilaterally. There is a remote lacunar infarct in the subcortical left parietal lobe white matter as well.  The ventricles are proportionate to the degree of atrophy. No significant extraaxial fluid collection is present.  Flow is present in the major intracranial arteries. The patient is status post bilateral lens replacements. The right maxillary sinus is mostly opacified. Mild mucosal thickening is worse on the right ethmoid air cells. The mastoid air cells are clear.  MRA HEAD FINDINGS  The A1 and M1 segments are normal. Anterior communicating artery is patent. The A2 segments are duplicated on the left, a normal variant. The MCA bifurcations are within normal limits. The branch vessels demonstrate mild distal small vessel attenuation.  The left vertebral artery is slightly dominant to the right. Mild atherosclerotic changes are noted within the this vertebral arteries without a significant stenosis relative to the more distal vessel. There is mild narrowing of the distal basilar artery without a significant stenosis. Both posterior cerebral arteries originate from the basilar tip. There is some attenuation of distal PCA branch vessels bilaterally, worse on the right.  IMPRESSION: 1. Acute nonhemorrhagic left paramedian pontine infarct corresponds with the patient's symptoms. 2. No acute supratentorial infarcts. 3. Extensive atrophy and white matter disease likely reflects the sequela of chronic microvascular ischemia. 4. Atherosclerotic changes in the vertebral arteries and basilar artery without a significant stenosis. 5. Mild distal small vessel attenuation  throughout, worse in the posterior circulation. These results will be called to the ordering clinician or representative by the Radiologist Assistant, and communication documented in the PACS or zVision Dashboard.   Electronically Signed   By: Lawrence Santiago M.D.   On: 06/28/2014 10:07    Assessment/Plan: Diagnosis: left paramedian pontine infarct 1. Does the need for close, 24 hr/day medical supervision in concert with the patient's rehab needs make it unreasonable for this patient to be served in a less intensive setting? Yes and Potentially 2. Co-Morbidities requiring supervision/potential complications: pneumonia, htn 3. Due to bladder management, bowel management, safety, skin/wound care, disease management, medication administration, pain management and patient education, does the patient require 24 hr/day rehab nursing? Yes 4. Does the patient require coordinated care of a physician, rehab nurse, PT (1-2 hrs/day, 5 days/week), OT (1-2 hrs/day, 5 days/week) and SLP (1-2 hrs/day, 5 days/week) to address physical and functional deficits in  the context of the above medical diagnosis(es)? Yes Addressing deficits in the following areas: balance, endurance, locomotion, strength, transferring, bowel/bladder control, bathing, dressing, feeding, grooming, toileting, cognition, speech, language, swallowing and psychosocial support 5. Can the patient actively participate in an intensive therapy program of at least 3 hrs of therapy per day at least 5 days per week? Yes 6. The potential for patient to make measurable gains while on inpatient rehab is excellent 7. Anticipated functional outcomes upon discharge from inpatient rehab are min assist  with PT, min assist and mod assist with OT, supervision and min assist with SLP. 8. Estimated rehab length of stay to reach the above functional goals is: 24-30 days 9. Does the patient have adequate social supports and living environment to accommodate these  discharge functional goals? Yes and Potentially 10. Anticipated D/C setting: Home 11. Anticipated post D/C treatments: HH therapy and Outpatient therapy 12. Overall Rehab/Functional Prognosis: good  RECOMMENDATIONS: This patient's condition is appropriate for continued rehabilitative care in the following setting: CIR Patient has agreed to participate in recommended program. Yes Note that insurance prior authorization may be required for reimbursement for recommended care.  Comment: Pt has a supportive therapy, and he was quite active prior to this hospitilization. Rehab Admissions Coordinator to follow up.  Thanks,  Meredith Staggers, MD, Mellody Drown     06/29/2014

## 2014-06-30 LAB — BASIC METABOLIC PANEL
Anion gap: 12 (ref 5–15)
BUN: 27 mg/dL — AB (ref 6–23)
CHLORIDE: 104 meq/L (ref 96–112)
CO2: 24 mEq/L (ref 19–32)
Calcium: 9.1 mg/dL (ref 8.4–10.5)
Creatinine, Ser: 0.77 mg/dL (ref 0.50–1.35)
GFR calc non Af Amer: 80 mL/min — ABNORMAL LOW (ref >90)
Glucose, Bld: 115 mg/dL — ABNORMAL HIGH (ref 70–99)
Potassium: 3.9 mEq/L (ref 3.7–5.3)
SODIUM: 140 meq/L (ref 137–147)

## 2014-06-30 LAB — CBC
HEMATOCRIT: 36.6 % — AB (ref 39.0–52.0)
HEMOGLOBIN: 11.9 g/dL — AB (ref 13.0–17.0)
MCH: 28.3 pg (ref 26.0–34.0)
MCHC: 32.5 g/dL (ref 30.0–36.0)
MCV: 87.1 fL (ref 78.0–100.0)
Platelets: 342 10*3/uL (ref 150–400)
RBC: 4.2 MIL/uL — ABNORMAL LOW (ref 4.22–5.81)
RDW: 12.8 % (ref 11.5–15.5)
WBC: 15.6 10*3/uL — ABNORMAL HIGH (ref 4.0–10.5)

## 2014-06-30 MED ORDER — AMOXICILLIN-POT CLAVULANATE 400-57 MG/5ML PO SUSR
800.0000 mg | Freq: Two times a day (BID) | ORAL | Status: DC
  Administered 2014-06-30 – 2014-07-02 (×5): 800 mg via ORAL
  Filled 2014-06-30 (×6): qty 10

## 2014-06-30 MED ORDER — RESOURCE THICKENUP CLEAR PO POWD
ORAL | Status: DC | PRN
  Filled 2014-06-30: qty 125

## 2014-06-30 NOTE — Plan of Care (Signed)
Problem: Progression Outcomes Goal: Initial discharge plan initiated Outcome: Completed/Met Date Met:  06/30/14     

## 2014-06-30 NOTE — Progress Notes (Signed)
Non-purposeful movement of pt RLE while sleeping was observed by family and RN.  Pt still cannot move extremity to command.

## 2014-06-30 NOTE — Progress Notes (Signed)
[facility] PROGRESS NOTE  [name] WJX:[id number] DOB: 09-Feb-1926 DOA: 06/27/2014  PCP: Michael Huh, MD  Brief HPI: [name] is a 80 y.o. male with history of hypertension and prostate cancer. He had a productive cough for few days prior to admission. Patient had gone to his PCP on day of admission and was prescribed antibiotics for possible pneumonia. Later patient's family called EMS as they found that patient was having right facial droop with right-sided hemiplegia and was brought to the ER. He has been found to have acute stroke.  Past medical history:  Past Medical History  Diagnosis Date  . Hypertension   . Cancer     Consultants: Neurology  Procedures:  2D ECHO Study Conclusions - Left ventricle: The cavity size was normal. There was mild focalbasal hypertrophy of the septum. Systolic function was normal.The estimated ejection fraction was in the range of 55% to 60%.Doppler parameters are consistent with abnormal left ventricularrelaxation (grade 1 diastolic dysfunction). Impressions: - No cardiac source of emboli was indentified.  Carotid Doppler Preliminary report: 1-39% ICA stenosis. Vertebral artery flow is antegrade.   Antibiotics: Levaquin 12/3-->12/5 Augmentin 12/5-->  Subjective: Patient continues to have dysarthria but wife thinks it is improved. Wife at bedside. Still unable to move right side of his body.   Objective: Vital Signs  Filed Vitals:   06/29/14 2135 06/30/14 0121 06/30/14 0534 06/30/14 0905  BP: 152/56 98/46 136/63 144/61  Pulse: 69 64 12 69  Temp: 100 F (37.8 C) 97.7 F (36.5 C) 98.7 F (37.1 C) 97.8 F (36.6 C)  TempSrc: Oral Axillary Oral Oral  Resp: 18 16 16 20   Height:      Weight:      SpO2: 96% 95% 99% 98%    Intake/Output Summary (Last 24 hours) at 06/30/14 0952 Last data filed at 06/30/14 0123  Gross per 24 hour  Intake      0 ml  Output    500 ml  Net   -500 ml   Filed Weights   06/28/14 0223  Weight: 68.04 kg (150 lb)    General appearance: alert, cooperative and no distress Resp: decreased air entry at bases. few crackles right base. Cardio: regular rate and rhythm, S1, S2 normal, no murmur, click, rub or gallop GI: soft, non-tender; bowel sounds normal; no masses,  no organomegaly Extremities: extremities normal, atraumatic, no cyanosis or edema Neurologic: Right hemiparesis. Right facial droop. Dysarthric  Lab Results:  Basic Metabolic Panel:  Recent Labs Lab 06/27/14 2334 06/27/14 2349 06/28/14 0448 06/28/14 2010 06/29/14 0545  NA 131* 132* 134* 134* 137  K 3.2* 2.9* 2.9* 3.6* 3.9  CL 91* 97 92* 96 98  CO2 23  --  22 22 21   GLUCOSE 146* 132* 78 91 101*  BUN 31* 29* 27* 23 25*  CREATININE 0.94 0.90 0.80 0.82 0.83  CALCIUM 9.6  --  9.2 9.1 9.1  MG  --   --  2.3  --   --    Liver Function Tests:  Recent Labs Lab 06/27/14 2334 06/28/14 0448  AST 21 18  ALT 32 28  ALKPHOS 155* 149*  BILITOT 0.5 0.6  PROT 7.1 6.5  ALBUMIN 2.7* 2.5*   CBC:  Recent Labs Lab 06/27/14 2334 06/27/14 2349 06/28/14 0448 06/29/14 0545 06/30/14 0900  WBC 18.8*  --  17.6* 20.3* 15.6*  NEUTROABS 15.9*  --  15.0*  --   --   HGB 12.1* 13.6 11.8* 11.8* 11.9*  HCT 36.3* 40.0 35.8* 35.5* 36.6*  MCV 85.4  --  86.7 86.6 87.1  PLT 381  --  376 344 342   CBG:  Recent Labs Lab 06/28/14 2022 06/28/14 2354 06/29/14 0353 06/29/14 1200 06/29/14 1738  GLUCAP 87 82 94 106* 105*     Studies/Results: Dg Swallowing Func-speech Pathology  06/29/2014   [name], CCC-SLP     06/29/2014  4:45 PM Objective Swallowing Evaluation: Modified Barium Swallowing Study   Patient Details  Name: [name] MRN: [id number] Date of Birth: 1925/11/08  Today's Date: 06/29/2014 Time: 0932-3557 SLP Time Calculation (min) (ACUTE ONLY): 25 min  Past Medical History:  Past Medical History  Diagnosis Date  . Hypertension   . Cancer    Past Surgical History:  Past Surgical  History  Procedure Laterality Date  . Appendectomy    . Kidney surgery     HPI:  [name] is a 80 y.o. male with history of hypertension  and prostate cancer. He had a productive cough for few days prior  to admission. Patient had gone to his PCP on day of admission and  was prescribed antibiotics for possible pneumonia. Later  patient's family called EMS as they found that patient was having  right facial droop with right-sided hemiplegia and was brought to  the ER. MRI shows an acute nonhemorrhagic left paramedian pontine  infarct.     Assessment / Plan / Recommendation Clinical Impression  Dysphagia Diagnosis: Moderate oral phase dysphagia;Moderate  pharyngeal phase dysphagia  Clinical impression: Pt has a moderate sensorimotor oropharyngeal  dysphagia. Oral phase is marked by weak manipulation and delayed  transit, requiring intermittent cues for posterior propulsion.  This leads to premature spillage into the pharynx that is  primarily contained in the valleculae, except for with thin  liquids which spill to the pyriform sinuses. This, paired with a  delayed swallow initiation leads to silent penetration with thin  liquids and large straw sips of nectar thick liquids, with pt  unable to clear penetrates. Thin liquids are ultimately sensed  when they reach, and begin to pass through, the vocal folds. No  airway penetration is observed with solids or smaller cup sips of  nectar thick liquids, although diffuse weakness leads to  residuals post-swallow. Recommend Dys 1 diet and nectar thick  liquids with full supervision. SLP to continue to follow for  tolerance and advancement.    Treatment Recommendation  Therapy as outlined in treatment plan below    Diet Recommendation Dysphagia 1 (Puree);Nectar-thick liquid   Liquid Administration via: Cup;No straw Medication Administration: Crushed with puree Supervision: Patient able to self feed;Full supervision/cueing  for compensatory strategies Compensations: Slow  rate;Small sips/bites;Multiple dry swallows  after each bite/sip;Check for pocketing;Check for anterior loss Postural Changes and/or Swallow Maneuvers: Seated upright 90  degrees;Upright 30-60 min after meal    Other  Recommendations Oral Care Recommendations: Oral care  BID;Oral care before and after PO Other Recommendations: Order thickener from pharmacy;Prohibited  food (jello, ice cream, thin soups);Remove water pitcher;Have  oral suction available   Follow Up Recommendations  Inpatient Rehab;24 hour supervision/assistance    Frequency and Duration min 2x/week  2 weeks   Pertinent Vitals/Pain n/a    SLP Swallow Goals     General Date of Onset: 06/27/14 HPI: TABITHA TUPPER is a 80 y.o. male with history of  hypertension and prostate cancer. He had a productive cough for  few days prior to admission. Patient had gone to  his PCP on day  of admission and was prescribed antibiotics for possible  pneumonia. Later patient's family called EMS as they found that  patient was having right facial droop with right-sided hemiplegia  and was brought to the ER. MRI shows an acute nonhemorrhagic left  paramedian pontine infarct. Type of Study: Modified Barium Swallowing Study Reason for Referral: Objectively evaluate swallowing function Previous Swallow Assessment: BSE 12/03 recommending NPO pending  MBS Diet Prior to this Study: NPO Temperature Spikes Noted: No Respiratory Status: Nasal cannula History of Recent Intubation: No Behavior/Cognition: Lethargic;Cooperative;Pleasant mood;Requires  cueing Oral Cavity - Dentition: Missing dentition Oral Motor / Sensory Function: Impaired - see Bedside swallow  eval Self-Feeding Abilities: Able to feed self;Needs assist Patient Positioning: Upright in chair Baseline Vocal Quality: Low vocal intensity Volitional Cough: Weak Volitional Swallow: Able to elicit Anatomy: Within functional limits Pharyngeal Secretions: Not observed secondary MBS    Reason for Referral Objectively evaluate  swallowing function   Oral Phase Oral Preparation/Oral Phase Oral Phase: Impaired Oral - Nectar Oral - Nectar Teaspoon: Right anterior bolus loss;Weak lingual  manipulation;Reduced posterior propulsion;Delayed oral  transit;Other (Comment) (unable to adequately transit small  amount of liquid) Oral - Nectar Cup: Right anterior bolus loss;Weak lingual  manipulation;Reduced posterior propulsion;Delayed oral transit Oral - Nectar Straw: Right anterior bolus loss;Weak lingual  manipulation;Reduced posterior propulsion;Delayed oral transit Oral - Thin Oral - Thin Cup: Right anterior bolus loss;Weak lingual  manipulation;Reduced posterior propulsion;Delayed oral transit Oral - Solids Oral - Puree: Right anterior bolus loss;Weak lingual  manipulation;Reduced posterior propulsion;Delayed oral  transit;Right pocketing in lateral sulci;Lingual/palatal  residue;Piecemeal swallowing Oral - Mechanical Soft: Right anterior bolus loss;Weak lingual  manipulation;Reduced posterior propulsion;Delayed oral  transit;Right pocketing in lateral sulci;Lingual/palatal  residue;Piecemeal swallowing;Impaired mastication   Pharyngeal Phase Pharyngeal Phase Pharyngeal Phase: Impaired Pharyngeal - Nectar Pharyngeal - Nectar Cup: Delayed swallow initiation;Reduced  pharyngeal peristalsis;Reduced anterior laryngeal  mobility;Reduced laryngeal elevation;Reduced tongue base  retraction;Pharyngeal residue - valleculae;Pharyngeal residue -  posterior pharnyx Penetration/Aspiration details (nectar cup): Material does not  enter airway Pharyngeal - Nectar Straw: Delayed swallow initiation;Reduced  pharyngeal peristalsis;Reduced anterior laryngeal  mobility;Reduced laryngeal elevation;Reduced tongue base  retraction;Penetration/Aspiration during swallow;Pharyngeal  residue - valleculae;Pharyngeal residue - posterior pharnyx Penetration/Aspiration details (nectar straw): Material enters  airway, remains ABOVE vocal cords and not ejected out Pharyngeal -  Thin Pharyngeal - Thin Cup: Delayed swallow initiation;Reduced  pharyngeal peristalsis;Reduced anterior laryngeal  mobility;Reduced laryngeal elevation;Reduced tongue base  retraction;Penetration/Aspiration during swallow;Pharyngeal  residue - valleculae;Pharyngeal residue - posterior pharnyx Penetration/Aspiration details (thin cup): Material enters  airway, passes BELOW cords and not ejected out despite cough  attempt by patient Pharyngeal - Solids Pharyngeal - Puree: Delayed swallow initiation;Reduced pharyngeal  peristalsis;Reduced anterior laryngeal mobility;Reduced laryngeal  elevation;Reduced tongue base retraction;Pharyngeal residue -  valleculae;Pharyngeal residue - posterior pharnyx Penetration/Aspiration details (puree): Material does not enter  airway Pharyngeal - Mechanical Soft: Delayed swallow initiation;Reduced  pharyngeal peristalsis;Reduced anterior laryngeal  mobility;Reduced laryngeal elevation;Reduced tongue base  retraction;Pharyngeal residue - valleculae;Pharyngeal residue -  posterior pharnyx  Cervical Esophageal Phase    GO    Cervical Esophageal Phase Cervical Esophageal Phase: [facility]        Germain Osgood, M.A. CCC-SLP [phone]   Germain Osgood 06/29/2014, 4:44 PM     Medications:  Scheduled: . amoxicillin-clavulanate  800 mg Oral Q12H  . aspirin  325 mg Oral Daily  . atorvastatin  20 mg Oral q1800  . enoxaparin (LOVENOX) injection  40 mg Subcutaneous Daily   Continuous: . 0.9 %  NaCl with KCl 20 mEq / L 75 mL/hr at 06/29/14 2139   AXK:PVVZSMOLM, RESOURCE THICKENUP CLEAR, senna-docusate  Assessment/Plan:  Principal Problem:   Stroke Active Problems:   CAP (community acquired pneumonia)   Normocytic anemia   Essential hypertension   Hypokalemia    Acute CVA with right-sided hemiplegia Neurology following and managing. NPO for swallow evaluation. Stroke work up has been completed. PT/OT recommend CIR. Await further input. Placed on Asprin. Started on statin.     Dysphagia secondary to Stroke Started on Dys 1 diet. Tolerating so far.   Abnormal rhythm Repeat EKG shows sinus rhyhm. No evidence for atrial fibrillation. First EKG shows artifacts and was discussed with on-call cardiologist. Telemetry shows first degree AV block. No further evaluation.  Pneumonia: Community Acquired versus Aspiration Will change to oral Augmentin. O2 as needed. WBC is improved.  Hypokalemia/Dehydration Potassium level is better. IVF to continue for now. Magnesium level was normal.  Normocytic Anemia Stable so far.   History of Essential Hypertension Currently allowing for permissive hypertension due to acute stroke.  History of prostate cancer. Stable  DVT Prophylaxis: Lovenox    Code Status: Full Code  Family Communication: Discussed with patient, his wife and his daughter  Disposition Plan: Appropriate for CIR per Rehab MD. Await further feedback/insurance approval etc. Appears to be medically stable currently.    LOS: 3 days   [facility] Pager [phone] 06/30/2014, 9:52 AM  If 8PM-8AM, please contact night-coverage at [email or web address], password [facility]

## 2014-07-01 LAB — BASIC METABOLIC PANEL
Anion gap: 12 (ref 5–15)
BUN: 24 mg/dL — ABNORMAL HIGH (ref 6–23)
CALCIUM: 8.6 mg/dL (ref 8.4–10.5)
CO2: 22 mEq/L (ref 19–32)
Chloride: 108 mEq/L (ref 96–112)
Creatinine, Ser: 0.72 mg/dL (ref 0.50–1.35)
GFR calc Af Amer: >90 mL/min (ref >90)
GFR, EST NON AFRICAN AMERICAN: 82 mL/min — AB (ref >90)
Glucose, Bld: 121 mg/dL — ABNORMAL HIGH (ref 70–99)
Potassium: 4.4 mEq/L (ref 3.7–5.3)
SODIUM: 142 meq/L (ref 137–147)

## 2014-07-01 LAB — CBC WITH DIFFERENTIAL/PLATELET
BASOS ABS: 0 10*3/uL (ref 0.0–0.1)
Basophils Relative: 0 % (ref 0–1)
EOS ABS: 0 10*3/uL (ref 0.0–0.7)
Eosinophils Relative: 0 % (ref 0–5)
HEMATOCRIT: 35.1 % — AB (ref 39.0–52.0)
Hemoglobin: 11.3 g/dL — ABNORMAL LOW (ref 13.0–17.0)
LYMPHS PCT: 6 % — AB (ref 12–46)
Lymphs Abs: 1 10*3/uL (ref 0.7–4.0)
MCH: 28.3 pg (ref 26.0–34.0)
MCHC: 32.2 g/dL (ref 30.0–36.0)
MCV: 87.8 fL (ref 78.0–100.0)
Monocytes Absolute: 1.2 10*3/uL — ABNORMAL HIGH (ref 0.1–1.0)
Monocytes Relative: 7 % (ref 3–12)
NEUTROS ABS: 14.9 10*3/uL — AB (ref 1.7–7.7)
NEUTROS PCT: 87 % — AB (ref 43–77)
PLATELETS: 353 10*3/uL (ref 150–400)
RBC: 4 MIL/uL — ABNORMAL LOW (ref 4.22–5.81)
RDW: 12.8 % (ref 11.5–15.5)
WBC MORPHOLOGY: INCREASED
WBC: 17.1 10*3/uL — AB (ref 4.0–10.5)

## 2014-07-01 MED ORDER — PNEUMOCOCCAL VAC POLYVALENT 25 MCG/0.5ML IJ INJ
0.5000 mL | INJECTION | INTRAMUSCULAR | Status: AC
  Administered 2014-07-02: 0.5 mL via INTRAMUSCULAR
  Filled 2014-07-01: qty 0.5

## 2014-07-01 NOTE — Plan of Care (Signed)
Problem: Acute Treatment Outcomes Goal: 02 Sats > 94% Outcome: Progressing

## 2014-07-01 NOTE — Plan of Care (Signed)
Problem: Acute Treatment Outcomes Goal: BP within ordered parameters Outcome: Progressing     

## 2014-07-01 NOTE — Plan of Care (Signed)
Problem: Progression Outcomes Goal: Communication method established Outcome: Progressing

## 2014-07-01 NOTE — Plan of Care (Signed)
Problem: Progression Outcomes Goal: Tolerating diet/TF at goal rate Outcome: Progressing     

## 2014-07-01 NOTE — Progress Notes (Signed)
[facility] PROGRESS NOTE  [name] DZH:[id number] DOB: 08-29-1925 DOA: 06/27/2014  PCP: Simona Huh, MD  Brief HPI: [name] is a 80 y.o. male with history of hypertension and prostate cancer. He had a productive cough for few days prior to admission. Patient had gone to his PCP on day of admission and was prescribed antibiotics for possible pneumonia. Later patient's family called EMS as they found that patient was having right facial droop with right-sided hemiplegia and was brought to the ER. He has been found to have acute stroke.  Past medical history:  Past Medical History  Diagnosis Date  . Hypertension   . Cancer     Consultants: Neurology  Procedures:  2D ECHO Study Conclusions - Left ventricle: The cavity size was normal. There was mild focalbasal hypertrophy of the septum. Systolic function was normal.The estimated ejection fraction was in the range of 55% to 60%.Doppler parameters are consistent with abnormal left ventricularrelaxation (grade 1 diastolic dysfunction). Impressions: - No cardiac source of emboli was indentified.  Carotid Doppler Preliminary report: 1-39% ICA stenosis. Vertebral artery flow is antegrade.   Antibiotics: Levaquin 12/3-->12/5 Augmentin 12/5-->  Subjective: Patient thinks his speech is improved. Trying to eat as much as possible. Still unable to move right side of his body.   Objective: Vital Signs  Filed Vitals:   06/30/14 1733 06/30/14 2054 07/01/14 0125 07/01/14 0603  BP: 155/70 153/58 153/59 141/55  Pulse: 73 74 80 70  Temp: 98 F (36.7 C) 99.5 F (37.5 C) 97.8 F (36.6 C) 97.8 F (36.6 C)  TempSrc: Oral Oral Oral Axillary  Resp: 22 20 20 24   Height:      Weight:      SpO2: 100% 97% 94% 96%    Intake/Output Summary (Last 24 hours) at 07/01/14 0908 Last data filed at 07/01/14 4196  Gross per 24 hour  Intake      0 ml  Output    250 ml  Net   -250 ml   Filed Weights   06/28/14 0223    Weight: 68.04 kg (150 lb)    General appearance: alert, cooperative and no distress Resp: decreased air entry at bases. few crackles right base. Cardio: regular rate and rhythm, S1, S2 normal, no murmur, click, rub or gallop GI: soft, non-tender; bowel sounds normal; no masses,  no organomegaly Extremities: extremities normal, atraumatic, no cyanosis or edema Neurologic: Right hemiparesis. Right facial droop. Dysarthria is improving  Lab Results:  Basic Metabolic Panel:  Recent Labs Lab 06/28/14 0448 06/28/14 2010 06/29/14 0545 06/30/14 0900 07/01/14 0600  NA 134* 134* 137 140 142  K 2.9* 3.6* 3.9 3.9 4.4  CL 92* 96 98 104 108  CO2 22 22 21 24 22   GLUCOSE 78 91 101* 115* 121*  BUN 27* 23 25* 27* 24*  CREATININE 0.80 0.82 0.83 0.77 0.72  CALCIUM 9.2 9.1 9.1 9.1 8.6  MG 2.3  --   --   --   --    Liver Function Tests:  Recent Labs Lab 06/27/14 2334 06/28/14 0448  AST 21 18  ALT 32 28  ALKPHOS 155* 149*  BILITOT 0.5 0.6  PROT 7.1 6.5  ALBUMIN 2.7* 2.5*   CBC:  Recent Labs Lab 06/27/14 2334 06/27/14 2349 06/28/14 0448 06/29/14 0545 06/30/14 0900 07/01/14 0600  WBC 18.8*  --  17.6* 20.3* 15.6* 17.1*  NEUTROABS 15.9*  --  15.0*  --   --  14.9*  HGB 12.1* 13.6 11.8*  11.8* 11.9* 11.3*  HCT 36.3* 40.0 35.8* 35.5* 36.6* 35.1*  MCV 85.4  --  86.7 86.6 87.1 87.8  PLT 381  --  376 344 342 353   CBG:  Recent Labs Lab 06/28/14 2022 06/28/14 2354 06/29/14 0353 06/29/14 1200 06/29/14 1738  GLUCAP 87 82 94 106* 105*     Studies/Results: Dg Swallowing Func-speech Pathology  06/29/2014   [name], CCC-SLP     06/29/2014  4:45 PM Objective Swallowing Evaluation: Modified Barium Swallowing Study   Patient Details  Name: [name] MRN: [id number] Date of Birth: September 14, 1925  Today's Date: 06/29/2014 Time: 4315-4008 SLP Time Calculation (min) (ACUTE ONLY): 25 min  Past Medical History:  Past Medical History  Diagnosis Date  . Hypertension   . Cancer     Past Surgical History:  Past Surgical History  Procedure Laterality Date  . Appendectomy    . Kidney surgery     HPI:  [name] is a 80 y.o. male with history of hypertension  and prostate cancer. He had a productive cough for few days prior  to admission. Patient had gone to his PCP on day of admission and  was prescribed antibiotics for possible pneumonia. Later  patient's family called EMS as they found that patient was having  right facial droop with right-sided hemiplegia and was brought to  the ER. MRI shows an acute nonhemorrhagic left paramedian pontine  infarct.     Assessment / Plan / Recommendation Clinical Impression  Dysphagia Diagnosis: Moderate oral phase dysphagia;Moderate  pharyngeal phase dysphagia  Clinical impression: Pt has a moderate sensorimotor oropharyngeal  dysphagia. Oral phase is marked by weak manipulation and delayed  transit, requiring intermittent cues for posterior propulsion.  This leads to premature spillage into the pharynx that is  primarily contained in the valleculae, except for with thin  liquids which spill to the pyriform sinuses. This, paired with a  delayed swallow initiation leads to silent penetration with thin  liquids and large straw sips of nectar thick liquids, with pt  unable to clear penetrates. Thin liquids are ultimately sensed  when they reach, and begin to pass through, the vocal folds. No  airway penetration is observed with solids or smaller cup sips of  nectar thick liquids, although diffuse weakness leads to  residuals post-swallow. Recommend Dys 1 diet and nectar thick  liquids with full supervision. SLP to continue to follow for  tolerance and advancement.    Treatment Recommendation  Therapy as outlined in treatment plan below    Diet Recommendation Dysphagia 1 (Puree);Nectar-thick liquid   Liquid Administration via: Cup;No straw Medication Administration: Crushed with puree Supervision: Patient able to self feed;Full supervision/cueing  for  compensatory strategies Compensations: Slow rate;Small sips/bites;Multiple dry swallows  after each bite/sip;Check for pocketing;Check for anterior loss Postural Changes and/or Swallow Maneuvers: Seated upright 90  degrees;Upright 30-60 min after meal    Other  Recommendations Oral Care Recommendations: Oral care  BID;Oral care before and after PO Other Recommendations: Order thickener from pharmacy;Prohibited  food (jello, ice cream, thin soups);Remove water pitcher;Have  oral suction available   Follow Up Recommendations  Inpatient Rehab;24 hour supervision/assistance    Frequency and Duration min 2x/week  2 weeks   Pertinent Vitals/Pain n/a    SLP Swallow Goals     General Date of Onset: 06/27/14 HPI: [name] is a 80 y.o. male with history of  hypertension and prostate cancer. He had a productive cough for  few days  prior to admission. Patient had gone to his PCP on day  of admission and was prescribed antibiotics for possible  pneumonia. Later patient's family called EMS as they found that  patient was having right facial droop with right-sided hemiplegia  and was brought to the ER. MRI shows an acute nonhemorrhagic left  paramedian pontine infarct. Type of Study: Modified Barium Swallowing Study Reason for Referral: Objectively evaluate swallowing function Previous Swallow Assessment: BSE 12/03 recommending NPO pending  MBS Diet Prior to this Study: NPO Temperature Spikes Noted: No Respiratory Status: Nasal cannula History of Recent Intubation: No Behavior/Cognition: Lethargic;Cooperative;Pleasant mood;Requires  cueing Oral Cavity - Dentition: Missing dentition Oral Motor / Sensory Function: Impaired - see Bedside swallow  eval Self-Feeding Abilities: Able to feed self;Needs assist Patient Positioning: Upright in chair Baseline Vocal Quality: Low vocal intensity Volitional Cough: Weak Volitional Swallow: Able to elicit Anatomy: Within functional limits Pharyngeal Secretions: Not observed secondary MBS     Reason for Referral Objectively evaluate swallowing function   Oral Phase Oral Preparation/Oral Phase Oral Phase: Impaired Oral - Nectar Oral - Nectar Teaspoon: Right anterior bolus loss;Weak lingual  manipulation;Reduced posterior propulsion;Delayed oral  transit;Other (Comment) (unable to adequately transit small  amount of liquid) Oral - Nectar Cup: Right anterior bolus loss;Weak lingual  manipulation;Reduced posterior propulsion;Delayed oral transit Oral - Nectar Straw: Right anterior bolus loss;Weak lingual  manipulation;Reduced posterior propulsion;Delayed oral transit Oral - Thin Oral - Thin Cup: Right anterior bolus loss;Weak lingual  manipulation;Reduced posterior propulsion;Delayed oral transit Oral - Solids Oral - Puree: Right anterior bolus loss;Weak lingual  manipulation;Reduced posterior propulsion;Delayed oral  transit;Right pocketing in lateral sulci;Lingual/palatal  residue;Piecemeal swallowing Oral - Mechanical Soft: Right anterior bolus loss;Weak lingual  manipulation;Reduced posterior propulsion;Delayed oral  transit;Right pocketing in lateral sulci;Lingual/palatal  residue;Piecemeal swallowing;Impaired mastication   Pharyngeal Phase Pharyngeal Phase Pharyngeal Phase: Impaired Pharyngeal - Nectar Pharyngeal - Nectar Cup: Delayed swallow initiation;Reduced  pharyngeal peristalsis;Reduced anterior laryngeal  mobility;Reduced laryngeal elevation;Reduced tongue base  retraction;Pharyngeal residue - valleculae;Pharyngeal residue -  posterior pharnyx Penetration/Aspiration details (nectar cup): Material does not  enter airway Pharyngeal - Nectar Straw: Delayed swallow initiation;Reduced  pharyngeal peristalsis;Reduced anterior laryngeal  mobility;Reduced laryngeal elevation;Reduced tongue base  retraction;Penetration/Aspiration during swallow;Pharyngeal  residue - valleculae;Pharyngeal residue - posterior pharnyx Penetration/Aspiration details (nectar straw): Material enters  airway, remains ABOVE  vocal cords and not ejected out Pharyngeal - Thin Pharyngeal - Thin Cup: Delayed swallow initiation;Reduced  pharyngeal peristalsis;Reduced anterior laryngeal  mobility;Reduced laryngeal elevation;Reduced tongue base  retraction;Penetration/Aspiration during swallow;Pharyngeal  residue - valleculae;Pharyngeal residue - posterior pharnyx Penetration/Aspiration details (thin cup): Material enters  airway, passes BELOW cords and not ejected out despite cough  attempt by patient Pharyngeal - Solids Pharyngeal - Puree: Delayed swallow initiation;Reduced pharyngeal  peristalsis;Reduced anterior laryngeal mobility;Reduced laryngeal  elevation;Reduced tongue base retraction;Pharyngeal residue -  valleculae;Pharyngeal residue - posterior pharnyx Penetration/Aspiration details (puree): Material does not enter  airway Pharyngeal - Mechanical Soft: Delayed swallow initiation;Reduced  pharyngeal peristalsis;Reduced anterior laryngeal  mobility;Reduced laryngeal elevation;Reduced tongue base  retraction;Pharyngeal residue - valleculae;Pharyngeal residue -  posterior pharnyx  Cervical Esophageal Phase    GO    Cervical Esophageal Phase Cervical Esophageal Phase: [facility]        Germain Osgood, M.A. CCC-SLP [phone]   Germain Osgood 06/29/2014, 4:44 PM     Medications:  Scheduled: . amoxicillin-clavulanate  800 mg Oral Q12H  . aspirin  325 mg Oral Daily  . atorvastatin  20 mg Oral q1800  . enoxaparin (LOVENOX) injection  40 mg Subcutaneous  Daily   Continuous: . 0.9 % NaCl with KCl 20 mEq / L 75 mL/hr at 06/30/14 1740   OQH:[id number], RESOURCE THICKENUP CLEAR, senna-docusate  Assessment/Plan:  Principal Problem:   Stroke Active Problems:   CAP (community acquired pneumonia)   Normocytic anemia   Essential hypertension   Hypokalemia    Acute CVA with right-sided hemiplegia Neurology following and managing. NPO for swallow evaluation. Stroke work up has been completed. PT/OT recommend CIR. Await further  input. Placed on Asprin. Started on statin.   Dysphagia secondary to Stroke Started on Dys 1 diet. Tolerating so far.   Abnormal rhythm Repeat EKG shows sinus rhyhm. No evidence for atrial fibrillation. First EKG shows artifacts and was discussed with on-call cardiologist. Telemetry shows first degree AV block. No further evaluation.  Pneumonia: Community Acquired versus Aspiration Continue Augmentin. O2 as needed. WBC is improved. Some bands still seen, but he is afebrile and stable.  Hypokalemia/Dehydration Potassium level is better. IVF to continue for now. Magnesium level was normal.  Normocytic Anemia Stable so far.   History of Essential Hypertension Currently allowing for permissive hypertension due to acute stroke.  History of prostate cancer. Stable  DVT Prophylaxis: Lovenox    Code Status: Full Code  Family Communication: Discussed with patient, his wife and his daughter  Disposition Plan: Appropriate for CIR per Rehab MD. Await further feedback/insurance approval etc. Appears to be medically stable currently.    LOS: 4 days   [facility] Pager [phone] 07/01/2014, 9:08 AM  If 8PM-8AM, please contact night-coverage at [email or web address], password [facility]

## 2014-07-01 NOTE — Plan of Care (Signed)
Problem: Acute Treatment Outcomes Goal: Airway maintained/protected Outcome: Completed/Met Date Met:  07/01/14

## 2014-07-01 NOTE — Plan of Care (Signed)
Problem: Acute Treatment Outcomes Goal: Neuro exam at baseline or improved Outcome: Not Progressing

## 2014-07-02 ENCOUNTER — Inpatient Hospital Stay (HOSPITAL_COMMUNITY)
Admission: RE | Admit: 2014-07-02 | Discharge: 2014-08-01 | DRG: 056 | Disposition: A | Discharge status: Skilled Nursing Facility | Payer: Medicare Other | Source: Intra-hospital | Attending: Physical Medicine & Rehabilitation | Admitting: Physical Medicine & Rehabilitation

## 2014-07-02 ENCOUNTER — Inpatient Hospital Stay (HOSPITAL_COMMUNITY): Payer: Medicare Other

## 2014-07-02 DIAGNOSIS — G8103 Flaccid hemiplegia affecting right nondominant side: Secondary | ICD-10-CM | POA: Diagnosis not present

## 2014-07-02 DIAGNOSIS — I6350 Cerebral infarction due to unspecified occlusion or stenosis of unspecified cerebral artery: Secondary | ICD-10-CM | POA: Diagnosis present

## 2014-07-02 DIAGNOSIS — B379 Candidiasis, unspecified: Secondary | ICD-10-CM | POA: Diagnosis present

## 2014-07-02 DIAGNOSIS — M25519 Pain in unspecified shoulder: Secondary | ICD-10-CM | POA: Diagnosis present

## 2014-07-02 DIAGNOSIS — R197 Diarrhea, unspecified: Secondary | ICD-10-CM | POA: Diagnosis not present

## 2014-07-02 DIAGNOSIS — R1310 Dysphagia, unspecified: Secondary | ICD-10-CM | POA: Diagnosis present

## 2014-07-02 DIAGNOSIS — Z79899 Other long term (current) drug therapy: Secondary | ICD-10-CM | POA: Diagnosis not present

## 2014-07-02 DIAGNOSIS — J189 Pneumonia, unspecified organism: Secondary | ICD-10-CM | POA: Diagnosis present

## 2014-07-02 DIAGNOSIS — I639 Cerebral infarction, unspecified: Secondary | ICD-10-CM | POA: Diagnosis present

## 2014-07-02 DIAGNOSIS — R05 Cough: Secondary | ICD-10-CM | POA: Diagnosis not present

## 2014-07-02 DIAGNOSIS — J984 Other disorders of lung: Secondary | ICD-10-CM | POA: Diagnosis not present

## 2014-07-02 DIAGNOSIS — R41841 Cognitive communication deficit: Secondary | ICD-10-CM | POA: Diagnosis present

## 2014-07-02 DIAGNOSIS — I69353 Hemiplegia and hemiparesis following cerebral infarction affecting right non-dominant side: Secondary | ICD-10-CM | POA: Diagnosis not present

## 2014-07-02 DIAGNOSIS — I69322 Dysarthria following cerebral infarction: Secondary | ICD-10-CM | POA: Diagnosis not present

## 2014-07-02 DIAGNOSIS — I10 Essential (primary) hypertension: Secondary | ICD-10-CM | POA: Diagnosis present

## 2014-07-02 DIAGNOSIS — Z87891 Personal history of nicotine dependence: Secondary | ICD-10-CM

## 2014-07-02 DIAGNOSIS — I4891 Unspecified atrial fibrillation: Secondary | ICD-10-CM | POA: Diagnosis present

## 2014-07-02 DIAGNOSIS — I6789 Other cerebrovascular disease: Secondary | ICD-10-CM | POA: Diagnosis not present

## 2014-07-02 DIAGNOSIS — R110 Nausea: Secondary | ICD-10-CM | POA: Diagnosis not present

## 2014-07-02 DIAGNOSIS — G8191 Hemiplegia, unspecified affecting right dominant side: Secondary | ICD-10-CM

## 2014-07-02 DIAGNOSIS — G8190 Hemiplegia, unspecified affecting unspecified side: Secondary | ICD-10-CM | POA: Diagnosis not present

## 2014-07-02 DIAGNOSIS — I69391 Dysphagia following cerebral infarction: Secondary | ICD-10-CM | POA: Diagnosis not present

## 2014-07-02 DIAGNOSIS — J690 Pneumonitis due to inhalation of food and vomit: Secondary | ICD-10-CM

## 2014-07-02 DIAGNOSIS — I69351 Hemiplegia and hemiparesis following cerebral infarction affecting right dominant side: Secondary | ICD-10-CM | POA: Diagnosis not present

## 2014-07-02 DIAGNOSIS — Z8546 Personal history of malignant neoplasm of prostate: Secondary | ICD-10-CM | POA: Diagnosis not present

## 2014-07-02 DIAGNOSIS — R531 Weakness: Secondary | ICD-10-CM | POA: Diagnosis not present

## 2014-07-02 LAB — URINALYSIS, ROUTINE W REFLEX MICROSCOPIC
BILIRUBIN URINE: NEGATIVE
Glucose, UA: NEGATIVE mg/dL
Hgb urine dipstick: NEGATIVE
Ketones, ur: NEGATIVE mg/dL
Leukocytes, UA: NEGATIVE
Nitrite: NEGATIVE
Protein, ur: NEGATIVE mg/dL
Specific Gravity, Urine: 1.024 (ref 1.005–1.030)
UROBILINOGEN UA: 1 mg/dL (ref 0.0–1.0)
pH: 5.5 (ref 5.0–8.0)

## 2014-07-02 MED ORDER — FLUCONAZOLE 100 MG PO TABS
100.0000 mg | ORAL_TABLET | Freq: Every day | ORAL | Status: AC
  Administered 2014-07-02 – 2014-07-06 (×5): 100 mg via ORAL
  Filled 2014-07-02 (×5): qty 1

## 2014-07-02 MED ORDER — ENSURE PUDDING PO PUDG
1.0000 | Freq: Three times a day (TID) | ORAL | Status: DC
  Administered 2014-07-03 – 2014-07-31 (×42): 1 via ORAL

## 2014-07-02 MED ORDER — CETYLPYRIDINIUM CHLORIDE 0.05 % MT LIQD
7.0000 mL | Freq: Two times a day (BID) | OROMUCOSAL | Status: DC
  Administered 2014-07-02 – 2014-07-05 (×5): 7 mL via OROMUCOSAL

## 2014-07-02 MED ORDER — SACCHAROMYCES BOULARDII 250 MG PO CAPS
250.0000 mg | ORAL_CAPSULE | Freq: Two times a day (BID) | ORAL | Status: DC
  Administered 2014-07-02 – 2014-08-01 (×58): 250 mg via ORAL
  Filled 2014-07-02 (×66): qty 1

## 2014-07-02 MED ORDER — BISACODYL 10 MG RE SUPP
10.0000 mg | Freq: Every day | RECTAL | Status: DC | PRN
  Administered 2014-07-14: 10 mg via RECTAL
  Filled 2014-07-02: qty 1

## 2014-07-02 MED ORDER — MAGIC MOUTHWASH
10.0000 mL | Freq: Three times a day (TID) | ORAL | Status: DC
  Administered 2014-07-02 – 2014-07-25 (×63): 10 mL via ORAL
  Filled 2014-07-02 (×74): qty 10

## 2014-07-02 MED ORDER — BENZONATATE 100 MG PO CAPS
100.0000 mg | ORAL_CAPSULE | Freq: Three times a day (TID) | ORAL | Status: DC
  Administered 2014-07-03 – 2014-07-25 (×59): 100 mg via ORAL
  Filled 2014-07-02 (×73): qty 1

## 2014-07-02 MED ORDER — ASPIRIN 325 MG PO TABS
325.0000 mg | ORAL_TABLET | Freq: Every day | ORAL | Status: DC
  Administered 2014-07-03 – 2014-08-01 (×30): 325 mg via ORAL
  Filled 2014-07-02 (×35): qty 1

## 2014-07-02 MED ORDER — ATORVASTATIN CALCIUM 20 MG PO TABS
20.0000 mg | ORAL_TABLET | Freq: Every day | ORAL | Status: DC
  Administered 2014-07-02 – 2014-07-31 (×29): 20 mg via ORAL
  Filled 2014-07-02 (×31): qty 1

## 2014-07-02 MED ORDER — ALUM & MAG HYDROXIDE-SIMETH 200-200-20 MG/5ML PO SUSP
30.0000 mL | ORAL | Status: DC | PRN
  Administered 2014-07-12 – 2014-07-18 (×2): 30 mL via ORAL
  Filled 2014-07-02 (×3): qty 30

## 2014-07-02 MED ORDER — GUAIFENESIN 100 MG/5ML PO SYRP
200.0000 mg | ORAL_SOLUTION | Freq: Four times a day (QID) | ORAL | Status: DC
  Administered 2014-07-02: 200 mg via ORAL
  Filled 2014-07-02 (×3): qty 10

## 2014-07-02 MED ORDER — PROCHLORPERAZINE MALEATE 5 MG PO TABS
5.0000 mg | ORAL_TABLET | Freq: Four times a day (QID) | ORAL | Status: DC | PRN
  Administered 2014-07-09 (×2): 5 mg via ORAL
  Administered 2014-07-12 – 2014-07-24 (×3): 10 mg via ORAL
  Administered 2014-07-25 – 2014-07-28 (×3): 5 mg via ORAL
  Filled 2014-07-02 (×11): qty 2

## 2014-07-02 MED ORDER — RESOURCE THICKENUP CLEAR PO POWD
ORAL | Status: DC | PRN
  Filled 2014-07-02: qty 125

## 2014-07-02 MED ORDER — PROCHLORPERAZINE 25 MG RE SUPP: 12.5000 mg | Freq: Four times a day (QID) | RECTAL | Status: DC | PRN

## 2014-07-02 MED ORDER — IPRATROPIUM-ALBUTEROL 0.5-2.5 (3) MG/3ML IN SOLN: 3.0000 mL | RESPIRATORY_TRACT | Status: DC | PRN

## 2014-07-02 MED ORDER — FLEET ENEMA 7-19 GM/118ML RE ENEM: 1.0000 | ENEMA | Freq: Once | RECTAL | Status: AC | PRN

## 2014-07-02 MED ORDER — GUAIFENESIN-DM 100-10 MG/5ML PO SYRP
5.0000 mL | ORAL_SOLUTION | Freq: Four times a day (QID) | ORAL | Status: DC | PRN
  Filled 2014-07-02: qty 10

## 2014-07-02 MED ORDER — AMOXICILLIN-POT CLAVULANATE 400-57 MG/5ML PO SUSR
800.0000 mg | Freq: Two times a day (BID) | ORAL | Status: DC
  Administered 2014-07-02 – 2014-07-09 (×14): 800 mg via ORAL
  Filled 2014-07-02 (×19): qty 10

## 2014-07-02 MED ORDER — TRAZODONE HCL 50 MG PO TABS
25.0000 mg | ORAL_TABLET | Freq: Every evening | ORAL | Status: DC | PRN
  Administered 2014-07-04 – 2014-07-20 (×2): 50 mg via ORAL
  Filled 2014-07-02 (×3): qty 1

## 2014-07-02 MED ORDER — LIDOCAINE HCL 2 % EX GEL
1.0000 "application " | CUTANEOUS | Status: DC | PRN
  Filled 2014-07-02: qty 5

## 2014-07-02 MED ORDER — SENNOSIDES-DOCUSATE SODIUM 8.6-50 MG PO TABS
1.0000 | ORAL_TABLET | Freq: Every evening | ORAL | Status: DC | PRN
  Administered 2014-07-19: 1 via ORAL
  Filled 2014-07-02 (×3): qty 1

## 2014-07-02 MED ORDER — MAGIC MOUTHWASH
10.0000 mL | Freq: Three times a day (TID) | ORAL | Status: DC
  Administered 2014-07-02: 10 mL via ORAL
  Filled 2014-07-02: qty 10

## 2014-07-02 MED ORDER — ENOXAPARIN SODIUM 40 MG/0.4ML ~~LOC~~ SOLN
40.0000 mg | SUBCUTANEOUS | Status: DC
  Administered 2014-07-03 – 2014-08-01 (×30): 40 mg via SUBCUTANEOUS
  Filled 2014-07-02 (×35): qty 0.4

## 2014-07-02 MED ORDER — PROCHLORPERAZINE EDISYLATE 5 MG/ML IJ SOLN
5.0000 mg | Freq: Four times a day (QID) | INTRAMUSCULAR | Status: DC | PRN
  Administered 2014-07-11: 5 mg via INTRAMUSCULAR
  Administered 2014-07-14: 10 mg via INTRAMUSCULAR
  Filled 2014-07-02 (×4): qty 2

## 2014-07-02 NOTE — Progress Notes (Signed)
Occupational Therapy Evaluation Patient Details Name: [name] MRN: [id number] DOB: 06/02/1926 Today's Date: 07/02/2014    History of Present Illness 80 yo male L handed admitted with R side weakness, R gaze preference, severe dysarthria and cognitive deficits. NIH=14 MRI(+) acute nonhemorrhagic L paramedian pontine infarct PMH: prostate CA, HTN, PNA(06/27/14)    Clinical Impression   Pt seen today and dysarthria is improving as well as his cognition. Pt was alert, oriented, and had good proprioceptive awareness of R extremties. Pt with R side weakness and was noted with no active movement, however pt pulled back R leg in response to anticipated pain on foot. Practiced UE weight-bearing and weight-shifting from side-side while seated EOB and pt initiated weight-shifting after ~30 seconds. Recommending CIR for intensive therapy as pt is motivated to get stronger and is demonstrating some regaining of function in R side. Will follow acutely to address OT goals.    Follow Up Recommendations  CIR    Equipment Recommendations  None recommended by OT (TBD in next venue)    Recommendations for Other Services Rehab consult     Precautions / Restrictions Precautions Precautions: Fall Restrictions Weight Bearing Restrictions: No      Mobility Bed Mobility Overal bed mobility: Needs Assistance Bed Mobility: Rolling;Sidelying to Sit Rolling: Max assist;+2 for physical assistance Sidelying to sit: Max assist;+2 for physical assistance;HOB elevated       General bed mobility comments: HOB elevated for aspiration precautions--pt with incr sputum in mouth and had just finished breakfast. Max +2 (A) to complete full roll and to protect RUE and RLE. (A) to descend bil LE off bed and to elevate trunk. (A) to support trunk in sitting position--pt with R lateral lean. Verbal cues to look up--pt with chin down. Verbal cues to reach for bedrails and sequencing.  Transfers Overall transfer  level: Needs assistance Equipment used:  Charlaine Dalton) Transfers: Sit to/from [employer] to Stand: Total assist;+2 physical assistance;+2 safety/equipment (Stedy) Stand pivot transfers: Total assist;+2 physical assistance;+2 safety/equipment Charlaine Dalton)       General transfer comment: Total +2 (A) with bariatric stedy and chuck pad underneath to give extra assist for pushing hips into neutral position. (A) to support RUE and to support for R lateral lean. Verbal cues for hand placement on Stedy and to keep head up.    Balance Overall balance assessment: Needs assistance Sitting-balance support: Single extremity supported;Feet supported Sitting balance-Leahy Scale: Poor Sitting balance - Comments: R lateral lean, chin down Postural control: Right lateral lean Standing balance support: Single extremity supported;During functional activity Standing balance-Leahy Scale: Zero                              ADL Overall ADL's : Needs assistance/impaired     Grooming: Wash/dry face;Oral care;Minimal assistance;Sitting Grooming Details (indicate cue type and reason): Pt able to brush teeth with setup provided but required min (A) to fully clear incr sputum from mouth which was causing coughing/choking. Pt able to dry mouth with setup and verbal cues.         Upper Body Dressing : Moderate assistance;Bed level Upper Body Dressing Details (indicate cue type and reason): Mod (A) to thread RUE.         Toileting- Clothing Manipulation and Hygiene: Maximal assistance;+2 for physical assistance;Bed level Toileting - Clothing Manipulation Details (indicate cue type and reason): Pt soiled on arrival. Max +2 (A) to complete peri-care at bed level.  Functional mobility during ADLs: Total assistance;+2 for physical assistance;+2 for safety/equipment Charlaine Dalton) General ADL Comments: Pt educated and practiced using L hand and leg to move R side. Pt initiated UE weight shift  (side-side) while seated EOB. Pt with active movement in R leg--pt pulled R leg back after feeling Stedy on his toes. Pt with good awareness of R extremeties--during stand-pivot transfer pt verbalized that his R hand (IV line) was stuck on Stedy.        Vision                     Perception     Praxis      Pertinent Vitals/Pain Pain Assessment: No/denies pain     Hand Dominance Right   Extremity/Trunk Assessment Upper Extremity Assessment Upper Extremity Assessment: RUE deficits/detail RUE Deficits / Details: Flaccid, no active movement RUE Coordination: decreased fine motor;decreased gross motor   Lower Extremity Assessment Lower Extremity Assessment: RLE deficits/detail;Defer to PT evaluation   Cervical / Trunk Assessment Cervical / Trunk Assessment: Kyphotic   Communication Communication Communication: Expressive difficulties (Improving)   Cognition Arousal/Alertness: Awake/alert Behavior During Therapy: WFL for tasks assessed/performed Overall Cognitive Status: Within Functional Limits for tasks assessed                     General Comments       Exercises       Shoulder Instructions      Home Living Family/patient expects to be discharged to:: Inpatient rehab                                        Prior Functioning/Environment Level of Independence: Independent             OT Diagnosis: Generalized weakness;Hemiplegia dominant side   OT Problem List: Decreased strength;Decreased range of motion;Decreased activity tolerance;Impaired balance (sitting and/or standing);Decreased coordination;Decreased safety awareness;Decreased knowledge of use of DME or AE;Decreased knowledge of precautions;Impaired UE functional use   OT Treatment/Interventions: Self-care/ADL training;Therapeutic exercise;Neuromuscular education;Energy conservation;DME and/or AE instruction;Therapeutic activities;Patient/family education;Balance training     OT Goals(Current goals can be found in the care plan section) Acute Rehab OT Goals Patient Stated Goal: to get stronger OT Goal Formulation: With patient Time For Goal Achievement: 07/16/14 Potential to Achieve Goals: Good ADL Goals Pt Will Perform Grooming: with min assist;sitting Pt Will Transfer to Toilet: with +2 assist;stand pivot transfer;ambulating;bedside commode;with max assist Additional ADL Goal #1: Pt will sit EOB for 5 minutes with min assist to complete grooming task.  OT Frequency: Min 3X/week   Barriers to D/C:            Co-evaluation              End of Session Equipment Utilized During Treatment: Gait belt;Oxygen Charlaine Dalton) Nurse Communication: Mobility status;Precautions;Need for lift equipment  Activity Tolerance: Patient tolerated treatment well Patient left: in chair;with call bell/phone within reach;with chair alarm set;with family/visitor present   Time: 1027-2536 OT Time Calculation (min): 50 min Charges:    G-Codes:    Redmond Baseman Jul 28, 2014, 9:47 AM

## 2014-07-02 NOTE — Progress Notes (Signed)
I met with [name], his wife, and daughter, [name] at bedside. They are in agreement to admission to inpt rehab today. I have discussed with Dr. Maryland Pink and will make arrangements for today. [phone]

## 2014-07-02 NOTE — Progress Notes (Signed)
PMR Admission Coordinator Pre-Admission Assessment  Patient: [name] is an 80 y.o., male MRN: [id number] DOB: 1926/04/23 Height: 5\' 9"  (175.3 cm) Weight: 68.04 kg (150 lb)  Insurance Information HMO: PPO: PCP: IPA: 80/20: yes OTHER: No HMO PRIMARY: Medicare a and b Policy#: [id number] Subscriber: pt Benefits: Phone #: palmet[employer] online Name: 06/29/14 Eff. Date: a 01/24/93 b 07/27/93 Deduct: $1260 Out of Pocket Max: none Life Max: none CIR: 100% SNF: 20 full days Outpatient: 80% Co-Pay: 20% Home Health: 100% Co-Pay: none DME: 80% Co-Pay: 20% Providers: pt choice  SECONDARY: BC[employer] of [place] supplement Policy#: [id number] Subscriber: pt No auth with medicare primary  Medicaid Application Date: Case Manager:  Disability Application Date: Case Worker:   Emergency Contact Information Contact Information    Name Relation Home Work Mobile   R[place]hac,Agnes Spouse [place]    [phone]     Current Medical History  Patient Admitting Diagnosis: Left pontine infarct  History of Present Illness:[name] is a 80 y.o. left handed male with history of HTN, prostate cancer, recent PNA who was admitted on 06/27/14 with right sided weakness with right gaze preference and difficulty talking. Patient was noted to be in A fib in setting of CAP and CT head without acute changes. NIHSS- 14. 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 2D echo with EF 55-60% with mild focal basal atrophy and grade 1 diastolic dysfunction. Carotid  dopplers without significant stenosis. 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 Also started on a statin.  PNA likely aspiration. He was initially started on Levaquin. His white count was high and then started improving and is now fluctuating. He is afebrile. Saturating well on oxygen. He'll be prescribed some Robitussin to help him expectorate. He was changed over to oral Augmentin. This can be continued for a total course of 10 days. Recommend repeating chest x-ray in 4-6 weeks.   Total: 13 NIH    Past Medical History  Past Medical History  Diagnosis Date  . Hypertension   . Cancer     Family History  family history includes CAD in his father.  Prior Rehab/Hospitalizations: none  Current Medications  Current facility-administered medications: 0.9 % NaCl with KCl 20 mEq/ L infusion, , Intravenous, Continuous, Bonnielee Haff, MD, Last Rate: 75 mL/hr at 07/01/14 0953, 1,000 mL at 07/01/14 0953; amoxicillin-clavulanate (AUGMENTIN) 400-57 MG/5ML suspension 800 mg, 800 mg, Oral, Q12H, Bonnielee Haff, MD, 800 mg at 07/02/14 1013 [DISCONTINUED] aspirin suppository 300 mg, 300 mg, Rectal, Daily, 300 mg at 06/29/14 1019 **OR** aspirin tablet 325 mg, 325 mg, Oral, Daily, Rise Patience, MD, 325 mg at 07/02/14 1013; atorvastatin (LIPITOR) tablet 20 mg, 20 mg, Oral, q1800, Bonnielee Haff, MD, 20 mg at 07/01/14 1711; enoxaparin (LOVENOX) injection 40 mg, 40 mg, Subcutaneous, Daily, Rise Patience, MD, 40 mg at 07/02/14 1013 guaifenesin (ROBITUSSIN) 100 MG/5ML syrup 200 mg, 200 mg, Oral, QID, Bonnielee Haff, MD; ibuprofen (ADVIL,MOTRIN) tablet 200 mg, 200 mg, Oral, Q6H PRN, Ritta Slot, NP, 200 mg at 06/29/14 2206; magic mouthwash, 10 mL, Oral, TID, Bonnielee Haff, MD; [place], , Oral, PRN, Bonnielee Haff, MD; senna-docusate (Senokot-S) tablet 1 tablet, 1 tablet, Oral, QHS PRN, Rise Patience, MD  Patients Current Diet: DIET - DYS 1  with nectar thick liquids  Precautions / Restrictions Precautions Precautions: Fall Precaution Comments: R side weakness and neglect Restrictions Weight Bearing Restrictions: No   Prior Activity Level Community (5-7x/wk): Pt I without AD pta, drove, active. Has home at [facility] and [place]. Plays gold . Plays golf. Had just returned from second home where he has been for two months.   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   Current Functional Level Cognition  Arousal/Alertness: Lethargic Overall Cognitive Status: Within Functional Limits for tasks assessed Orientation Level: Oriented X4 Following Commands: Follows one step commands inconsistently General Comments: R side weakness with maybe beginning of pusher syndrome starting  Attention: Sustained Sustained Attention: Impaired Sustained Attention Impairment: Verbal basic Memory: Impaired Memory Impairment: Decreased recall of new information Problem Solving: Impaired Problem Solving Impairment: Functional basic Safety/Judgment: Impaired   Extremity Assessment (includes Sensation/Coordination)          ADLs  Overall ADL's : Needs assistance/impaired Grooming: Wash/dry face, Oral care, Minimal assistance, Sitting Grooming Details (indicate cue type and reason): Pt able to brush teeth with setup provided but required min (A) to fully clear incr sputum from mouth which was causing coughing/choking. Pt able to dry mouth with setup and verbal cues. Upper Body Dressing : Moderate assistance, Bed level Upper Body Dressing Details (indicate cue type and reason): Mod (A) to thread RUE. Toileting-  Clothing Manipulation and Hygiene: Maximal assistance, +2 for physical assistance, Bed level Toileting - Clothing Manipulation Details (indicate cue type and reason): Pt soiled on arrival. Max +2 (A) to complete peri-care at bed level. Functional mobility during ADLs: Total assistance, +2 for physical assistance, +2 for safety/equipment Charlaine Dalton) General ADL Comments: Pt educated and practiced using L hand and leg to move R side. Pt initiated UE weight shift (side-side) while seated EOB. Pt with active movement in R leg--pt pulled R leg back after feeling Stedy on his toes. Pt with good awareness of R extremeties--during stand-pivot transfer pt verbalized that his R hand (IV line) was stuck on Stedy.     Mobility  Overal bed mobility: Needs Assistance Bed Mobility: Rolling, Sidelying to Sit Rolling: Max assist, +2 for physical assistance Sidelying to sit: Max assist, +2 for physical assistance, HOB elevated General bed mobility comments: HOB elevated for aspiration precautions--pt with incr sputum in mouth and had just finished breakfast. Max +2 (A) to complete full roll and to protect RUE and RLE. (A) to descend bil LE off bed and to elevate trunk. (A) to support trunk in sitting position--pt with R lateral lean. Verbal cues to look up--pt with chin down. Verbal cues to reach for bedrails and sequencing.    Transfers  Overall transfer level: Needs assistance Equipment used: Charlaine Dalton) Transfers: Sit to/from Stand, [employer] Sit to Stand: Total assist, +2 physical assistance, +2 safety/equipment (Stedy) Stand pivot transfers: Total assist, +2 physical assistance, +2 safety/equipment (Stedy) General transfer comment: Total +2 (A) with bariatric stedy and chuck pad underneath to give extra assist for pushing hips into neutral position. (A) to support RUE and to support for R lateral lean. Verbal cues for hand placement on Stedy and to keep head up.    Ambulation / Gait / Stairs /  Wheelchair Mobility  Ambulation/Gait General Gait Details: unable to walk  Posture / Balance Dynamic Sitting Balance Sitting balance - Comments: R lateral lean, chin down    Special needs/care consideration Oxygen O2 at 2 liters [place]. Productive cough. Needs assistance to expectorate Bowel mgmt: incontinent Bladder mgmt: condom catheter    Previous Home Environment Living Arrangements: Spouse/significant other Lives With: Spouse Available Help at Discharge: Family, Other (Comment) (wife 49 yo on [place]. Either will hire assist at home or pt wi) Type of Home: [place] Name: pt lived at [place]: Two level, Able to live on main level with bedroom/bathroom Alternate Level Stairs-Number of Steps: flight. Couple lives on main level Home Access: Stairs to enter Entrance Stairs-Rails: Left, None (pt left hand dominant) Entrance Stairs-Number of Steps: 4 Bathroom Shower/Tub: Tub/shower unit, Industrial/product designer: Yes How Accessible: Accessible via walker Home Care Services: No Type of Home Care Services: Housekeeping Additional Comments: pt has lived at their second home in [place] for past two months  Discharge Living Setting Plans for Discharge Living Setting: Patient's home, Lives with (comment), Other (Comment) (lives with 47 yo wife) Type of Home at Discharge: House Discharge Home Layout: Two level, Able to live on main level with bedroom/bathroom (pta couple only used main level) Discharge Home Access: Stairs to enter Entrance Stairs-Rails: Left, None (left hand dominant) Entrance Stairs-Number of Steps: 4 Discharge Bathroom Shower/Tub: Tub/shower unit, Curtain Discharge Bathroom Toilet: Standard Discharge Bathroom Accessibility: Yes How Accessible: Accessible via walker Does the patient have any problems obtaining your medications?: No  I have spoken extensively with pt's wife and two daughters concerning  plans for d/c. They would have to hire assistance at home for wife unable to provide more than supervision level, or go to SNF from rehab to complete his rehab course. [name] lives in [place] and other daughter in [place]. They have asked if he could just stay longer on rehab. I discussed average LOS and that we are estimating 24-30 days per rehab consult. I discussed the unlikely extension of his stay past this estimate unless a few days would make the difference of him returning directly home vs SNF.  Social/Family/Support Systems Patient Roles: Spouse, Parent, Other (Comment) (retired [employer]; former Hydrologist ) Sport and exercise psychologist Information: Anish Vana, wife and Maryelizabeth Rowan, dtr from [place] Anticipated Caregiver: wife and hired caregivers vs SNF to complete his rehab after CIR Anticipated Caregiver's Contact Information: see above Ability/Limitations of Caregiver: wife 6 yo and uses cane Caregiver Availability: 24/7 Discharge Plan Discussed with Primary Caregiver: Yes Is Caregiver In Agreement with Plan?: Yes Does Caregiver/Family have Issues with Lodging/Transportation while Pt is in Rehab?: No  Goals/Additional Needs Patient/Family Goal for Rehab: Mod assist PT and OT, min assist SLP Expected length of stay: [facility] 24- 30 days Cultural Considerations: [place], [employer] Pt/Family Agrees to Admission and willing to participate: Yes Program Orientation Provided & Reviewed with Pt/Caregiver Including Roles & Responsibilities: Yes  Decrease burden of Care through IP rehab admission: Diet advancement, Decrease number of caregivers, Bowel and bladder program and Patient/family education. Pt likely will need continued SNF rehab before eventually returning home.  Possible need for SNF placement upon discharge:yes. I have spoken with wife and two daughters concerning the likelihood of SNF to complete his rehab course.  Patient Condition: This patient's medical and  functional status has changed since the consult dated: 06/29/2014 in which the Rehabilitation Physician determined and documented that the patient's condition is appropriate for intensive rehabilitative care in an inpatient  rehabilitation facility. See "History of Present Illness" (above) for medical update. Functional changes are: max assist. Patient's medical and functional status update has been discussed with the Rehabilitation physician and patient remains appropriate for inpatient rehabilitation. Will admit to inpatient rehab today.  Preadmission Screen Completed By: Cleatrice Burke, 07/02/2014 1:06 PM ______________________________________________________________________  Discussed status with Dr. Naaman Plummer on 07/02/2014 at 1305 and received telephone approval for admission today.  Admission Coordinator: Cleatrice Burke, time 3578 Date 07/02/2014.          Cosigned by: Meredith Staggers, MD at 07/02/2014 2:00 PM  Revision History     Date/Time User Provider Type Action   07/02/2014 2:00 PM Meredith Staggers, MD Physician Cosign   07/02/2014 1:07 PM Cleatrice Burke, RN Rehab Admission Coordinator Sign

## 2014-07-02 NOTE — Progress Notes (Signed)
Patient is discharged from room 4N13 at this time. Alert and in stable condition. IV site d/c'd as well as tele. Report called to nurse Sharyn Lull RN at [facility]. Transported via bed with wife, daughter and all belongings at side.

## 2014-07-02 NOTE — H&P (View-Only) (Signed)
Physical Medicine and Rehabilitation Admission H&P    Chief Complaint  Patient presents with  . Right sided weakness, right gaze preference, severe dysarthria as well as cognitive deficits    HPI:  [name] is a 80 y.o. left handed male with history of HTN, prostate cancer, recent PNA with malaise X 1 week who was admitted on 06/27/14 with right sided weakness with right gaze preference and difficulty talking. Patient was noted to be in A fib in setting of CAP and CT head without acute changes. EKG evaluated by cardiology and felt to have multiple artifacts with 1 degree AVB and repeat EKG with NSR.  NIHSS- 14. 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 2D echo with EF 55-60% with mild focal basal atrophy and grade 1 diastolic dysfunction. Carotid dopplers without significant stenosis.   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   Speech/language evaluation revals moderate to severe dysarthria with perseveration, echolalia, impaired cognition as well as difficulty following one step commands.  MBS with delayed swallow with silent penetration of thin and nectar with straws--he was started on dysphagia 1 diet with nectar liquids but continues to have coughing episodes due to difficulty handling secretions. He continues on Augmentin for CAP v/s aspiration. Dehydration resolved past aggressive hydration. Patient with subsequent right hemiparesis with sensory deficits, left inattention, dysphagia, severe dysarthria as well as cognitive linguistic deficits.    Review of Systems  Respiratory: Positive for cough and shortness of breath.   Cardiovascular: Negative for chest pain.  Gastrointestinal: Negative for nausea.  Neurological:  Positive for speech change and focal weakness.      Past Medical History  Diagnosis Date  . Hypertension   . Cancer     Past Surgical History  Procedure Laterality Date  . Appendectomy    . Kidney surgery      Family History  Problem Relation Age of Onset  . CAD Father     Social History:  Married. Wife elderly and needs an AD to get around. Per reports that he has quit smoking in 1959. He does not have any smokeless tobacco history on file. He reports that he drinks alcohol daily. He reports that he does not use illicit drugs.   Allergies: No Known Allergies    Medications Prior to Admission  Medication Sig Dispense Refill  . hydrochlorothiazide (HYDRODIURIL) 25 MG tablet Take 25 mg by mouth daily.      Home: Home Living Family/patient expects to be discharged to:: Inpatient rehab  Lives With: Spouse   Functional History: Prior Function Level of Independence: Independent  Functional Status:  Mobility: Bed Mobility Overal bed mobility: Needs Assistance Bed Mobility: Rolling, Sidelying to Sit Rolling: Max assist, +2 for physical assistance Sidelying to sit: Max assist, +2 for physical assistance, HOB elevated General bed mobility comments: HOB elevated for aspiration precautions--pt with incr sputum in mouth and had just finished breakfast. Max +2 (A) to complete full roll and to protect RUE and RLE. (A) to descend bil LE off bed and to elevate trunk. (A) to support trunk in sitting position--pt with R lateral lean. Verbal cues to look up--pt with chin down. Verbal cues to reach for bedrails and sequencing. Transfers Overall transfer level: Needs assistance Equipment used:  Charlaine Dalton) Transfers: Sit to/from  Stand, Stand Pivot Transfers Sit to Stand: Total assist, +2 physical assistance, +2 safety/equipment (Stedy) Stand pivot transfers: Total assist, +2 physical assistance, +2 safety/equipment (Stedy) General transfer comment: Total +2 (A) with bariatric stedy and  chuck pad underneath to give extra assist for pushing hips into neutral position. (A) to support RUE and to support for R lateral lean. Verbal cues for hand placement on Stedy and to keep head up. Ambulation/Gait General Gait Details: unable to walk    ADL: ADL Overall ADL's : Needs assistance/impaired Grooming: Wash/dry face, Oral care, Minimal assistance, Sitting Grooming Details (indicate cue type and reason): Pt able to brush teeth with setup provided but required min (A) to fully clear incr sputum from mouth which was causing coughing/choking. Pt able to dry mouth with setup and verbal cues. Upper Body Dressing : Moderate assistance, Bed level Upper Body Dressing Details (indicate cue type and reason): Mod (A) to thread RUE. Toileting- Clothing Manipulation and Hygiene: Maximal assistance, +2 for physical assistance, Bed level Toileting - Clothing Manipulation Details (indicate cue type and reason): Pt soiled on arrival. Max +2 (A) to complete peri-care at bed level. Functional mobility during ADLs: Total assistance, +2 for physical assistance, +2 for safety/equipment Charlaine Dalton) General ADL Comments: Pt educated and practiced using L hand and leg to move R side. Pt initiated UE weight shift (side-side) while seated EOB. Pt with active movement in R leg--pt pulled R leg back after feeling Stedy on his toes. Pt with good awareness of R extremeties--during stand-pivot transfer pt verbalized that his R hand (IV line) was stuck on Stedy.     Cognition: Cognition Overall Cognitive Status: Within Functional Limits for tasks assessed Arousal/Alertness: Lethargic Orientation Level: Oriented X4 Attention: Sustained Sustained Attention: Impaired Sustained Attention Impairment: Verbal basic Memory: Impaired Memory Impairment: Decreased recall of new information Problem Solving: Impaired Problem Solving Impairment: Functional basic Safety/Judgment: Impaired Cognition Arousal/Alertness:  Awake/alert Behavior During Therapy: WFL for tasks assessed/performed Overall Cognitive Status: Within Functional Limits for tasks assessed Area of Impairment: Following commands, Awareness, Problem solving, Memory Memory: Decreased short-term memory Following Commands: Follows one step commands inconsistently Awareness: Intellectual, Anticipatory Problem Solving: Slow processing, Decreased initiation, Difficulty sequencing, Requires verbal cues, Requires tactile cues General Comments: 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 rate 20, height 5' 9"  (1.753 m), weight 68.04 kg (150 lb), SpO2 98 %. Physical Exam  Nursing note and vitals reviewed. Constitutional: He is oriented to person, place, and time. He appears well-developed and well-nourished. No distress.  Drooling out of the right side of his mouth  HENT:  Head: Normocephalic and atraumatic.  Tongue with thick white coating and dry crusted areas on lips. 2.5 cm scabbed area on scalp (skin cancer excised)  Eyes: Conjunctivae are normal. Pupils are equal, round, and reactive to light. Right eye exhibits no discharge. Left eye exhibits no discharge. No scleral icterus.  Neck: Normal range of motion. Neck supple. No JVD present. No tracheal deviation present. No thyromegaly present.  Cardiovascular: Normal rate and regular rhythm.  Exam reveals no friction rub.   No murmur heard. Respiratory: Effort normal. No accessory muscle usage. No respiratory distress. He has wheezes. He has rales in the left upper field. He exhibits no tenderness.  GI: Soft. Bowel sounds are normal. He exhibits no distension. There is no tenderness. There is no rebound.  Musculoskeletal: He exhibits no edema or  tenderness.  Neurological: He is alert and oriented to person, place, and time.  Moderate to severe dysarthria with wet  voice but able to clear secretions. Right facial weakness and central 7. right gaze preference. Poor posture with tendency to slump to the right but able to self correct with cues. More alert today with improved ability to follow one step commands.  Senses pain in both arm and leg. Light touch appears grossly intact. Toes up. DTR's hyperactive RUE and RLE.. MMT: 0/5 RUE prox to distal. LLE: trace to 0/5 prox to 0/5 distal.  Skin: Skin is warm and dry.  Psychiatric:  Flat, slow to engage    Results for orders placed or performed during the hospital encounter of 06/27/14 (from the past 48 hour(s))  CBC with Differential     Status: Abnormal   Collection Time: 07/01/14  6:00 AM  Result Value Ref Range   WBC 17.1 (H) 4.0 - 10.5 K/uL    Comment: WHITE COUNT CONFIRMED ON SMEAR   RBC 4.00 (L) 4.22 - 5.81 MIL/uL   Hemoglobin 11.3 (L) 13.0 - 17.0 g/dL   HCT 35.1 (L) 39.0 - 52.0 %   MCV 87.8 78.0 - 100.0 fL   MCH 28.3 26.0 - 34.0 pg   MCHC 32.2 30.0 - 36.0 g/dL   RDW 12.8 11.5 - 15.5 %   Platelets 353 150 - 400 K/uL    Comment: PLATELET COUNT CONFIRMED BY SMEAR   Neutrophils Relative % 87 (H) 43 - 77 %   Lymphocytes Relative 6 (L) 12 - 46 %   Monocytes Relative 7 3 - 12 %   Eosinophils Relative 0 0 - 5 %   Basophils Relative 0 0 - 1 %   Neutro Abs 14.9 (H) 1.7 - 7.7 K/uL   Lymphs Abs 1.0 0.7 - 4.0 K/uL   Monocytes Absolute 1.2 (H) 0.1 - 1.0 K/uL   Eosinophils Absolute 0.0 0.0 - 0.7 K/uL   Basophils Absolute 0.0 0.0 - 0.1 K/uL   WBC Morphology INCREASED BANDS (>20% BANDS)    Smear Review LARGE PLATELETS PRESENT   Basic metabolic panel     Status: Abnormal   Collection Time: 07/01/14  6:00 AM  Result Value Ref Range   Sodium 142 137 - 147 mEq/L   Potassium 4.4 3.7 - 5.3 mEq/L   Chloride 108 96 - 112 mEq/L   CO2 22 19 - 32 mEq/L   Glucose, Bld 121 (H) 70 - 99 mg/dL   BUN 24 (H) 6 - 23 mg/dL   Creatinine, Ser 0.72 0.50 - 1.35 mg/dL   Calcium 8.6 8.4 - 10.5 mg/dL   GFR calc non Af Amer  82 (L) >90 mL/min   GFR calc Af Amer >90 >90 mL/min    Comment: (NOTE) The eGFR has been calculated using the CKD EPI equation. This calculation has not been validated in all clinical situations. eGFR's persistently <90 mL/min signify possible Chronic Kidney Disease.    Anion gap 12 5 - 15   No results found.     Medical Problem List and Plan: 1. Functional deficits secondary to left paramedian pontine infarct 2.  DVT Prophylaxis/Anticoagulation: Pharmaceutical: Lovenox 3.  Pain Management: N/A 4.  Mood: LCSW to follow for evaluation and support.  5.  Neuropsych: This patient is not capable of making decisions on his own behalf. 6.  Skin/Wound Care: Routine pressure relief measures. Turn patient every 2 hours when in bed.  7.  Fluids/Electrolytes/Nutrition: Monitor I/O. Check daily weights  to monitor for signs of overload.  8.  CAP: Will check follow CXR to monitor for stability/ fluid overload as continues to have difficulty handling secretions as well as cough. Antibiotic Day # 4 9.   Acute renal insufficiency: Improved. Off HCTZ at this time. 10.   HTN: Will monitor every 8 hours and allow permissive HTN for adequate perfusion. 11. Reactive leucocytosis: Will recheck in am. Follow temp curve for now.  12. Thrush: Will treat with short course of diflucan.      Post Admission Physician Evaluation: 1. Functional deficits secondary  to left paramedian pontine infarct with dense right hemiparesis. 2. Patient is admitted to receive collaborative, interdisciplinary care between the physiatrist, rehab nursing staff, and therapy team. 3. Patient's level of medical complexity and substantial therapy needs in context of that medical necessity cannot be provided at a lesser intensity of care such as a SNF. 4. Patient has experienced substantial functional loss from his/her baseline which was documented above under the "Functional History" and "Functional Status" headings.  Judging by the  patient's diagnosis, physical exam, and functional history, the patient has potential for functional progress which will result in measurable gains while on inpatient rehab.  These gains will be of substantial and practical use upon discharge  in facilitating mobility and self-care at the household level. 5. Physiatrist will provide 24 hour management of medical needs as well as oversight of the therapy plan/treatment and provide guidance as appropriate regarding the interaction of the two. 6. 24 hour rehab nursing will assist with bladder management, bowel management, safety, skin/wound care, disease management, medication administration, pain management and patient education  and help integrate therapy concepts, techniques,education, etc. 7. PT will assess and treat for/with: Lower extremity strength, range of motion, stamina, balance, functional mobility, safety, adaptive techniques and equipment, NMR, visual-spatial awareness, family education, leisure awareness.   Goals are: min assist to mod assist. 8. OT will assess and treat for/with: ADL's, functional mobility, safety, upper extremity strength, adaptive techniques and equipment, NMR, visual-perceptual awareness, caregiver education, ego support.   Goals are: min to mod assist. Therapy may proceed with showering this patient. 9. SLP will assess and treat for/with: communication, cognition, swallowing, speech intelligibility.  Goals are: min assist +. 10. Case Management and Social Worker will assess and treat for psychological issues and discharge planning. 11. Team conference will be held weekly to assess progress toward goals and to determine barriers to discharge. 12. Patient will receive at least 3 hours of therapy per day at least 5 days per week. 13. ELOS: 22-28 days       14. Prognosis:  excellent     Meredith Staggers, MD, [facility] 07/02/2014   07/02/2014

## 2014-07-02 NOTE — Progress Notes (Signed)
Physical Medicine and Rehabilitation Consult   Reason for Consult: Right sided weakness, right gaze preference, severe dysarthria as well as cognitive deficits Referring Physician: Dr. Maryland Pink.    HPI: [name] is a 80 y.o. left handed male with history of HTN, prostate cancer, recent PNA who was admitted on 06/27/14 with right sided weakness with right gaze preference and difficulty talking. Patient was noted to be in A fib in setting of CAP and CT head without acute changes. NIHSS- 14. 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 2D echo with EF 55-60% with mild focal basal atrophy and grade 1 diastolic dysfunction. Carotid dopplers without significant stenosis. 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 ST evaluation done yesterday and patient remains NPO due to lethargy with delay in swallow initiation as well as signs of aspiration. Speech/language evaluation revals moderate to severe dysarthria with perseveration, echolalia, impaired cognition as well as difficulty following one step commands. MD, ST and family recommending CIR for follow up therapy.    Review of Systems  Unable to perform ROS: mental acuity     Past Medical History  Diagnosis Date  . Hypertension   . Cancer     Past Surgical History  Procedure Laterality Date  . Appendectomy    . Kidney surgery      Family History  Problem Relation Age of Onset  . CAD Father     Social History: Married. Wife elderly and uses cane to get around. Per reports that he has quit smoking in 1959. Marland Kitchen He does not have any smokeless tobacco history on file. He reports that he drinks alcohol daily. He reports that he does not use illicit  drugs.    Allergies: No Known Allergies    Medications Prior to Admission  Medication Sig Dispense Refill  . hydrochlorothiazide (HYDRODIURIL) 25 MG tablet Take 25 mg by mouth daily.       Home: Home Living Family/patient expects to be discharged to:: Skilled nursing facility Lives With: Spouse  Functional History:   Functional Status:  Mobility:          ADL:    Cognition: Cognition Overall Cognitive Status: Impaired/Different from baseline Arousal/Alertness: Lethargic Orientation Level: Oriented to person, Disoriented to place, Disoriented to time, Disoriented to situation Attention: Sustained Sustained Attention: Impaired Sustained Attention Impairment: Verbal basic Memory: Impaired Memory Impairment: Decreased recall of new information Problem Solving: Impaired Problem Solving Impairment: Functional basic Safety/Judgment: Impaired Cognition Overall Cognitive Status: Impaired/Different from baseline  Blood pressure 147/50, pulse 67, temperature 98.2 F (36.8 C), temperature source Oral, resp. rate 18, height 5\' 9"  (1.753 m), weight 68.04 kg (150 lb), SpO2 97 %. Physical Exam  Nursing note and vitals reviewed. Constitutional: He appears well-developed and well-nourished. He appears lethargic. He is easily aroused.  HENT:  Head: Normocephalic and atraumatic.  Dry oral mucosa.  Eyes: Conjunctivae are normal. Pupils are equal, round, and reactive to light.  Neck: Normal range of motion. Neck supple.  Cardiovascular: Normal rate and regular rhythm.  No murmur heard. Respiratory: Effort normal. No respiratory distress. He has decreased breath sounds. He has no wheezes.  GI: Soft. Bowel sounds are normal. He exhibits no distension. There is no tenderness.  Musculoskeletal: He exhibits no edema or tenderness.  Neurological: He is easily aroused. He  appears lethargic.  Severe dysarthria with right facial weakness. Oriented to self and able to  state age and DOB. Able to state place and situation with choice of two. Dense right hemiparesis with emerging tone RLE. Senses pain in both arm and leg. Lacks insight and awareness of deficits. Was able to follow simple one step motor commands with cues.  Skin: Skin is warm and dry.  Psychiatric:  Cooperative when able to participate in exam.       Lab Results Last 24 Hours    Results for orders placed or performed during the hospital encounter of 06/27/14 (from the past 24 hour(s))  Glucose, capillary Status: None   Collection Time: 06/28/14 11:58 AM  Result Value Ref Range   Glucose-Capillary 98 70 - 99 mg/dL   Comment 1 Notify RN    Comment 2 Documented in Chart   Glucose, capillary Status: None   Collection Time: 06/28/14 4:37 PM  Result Value Ref Range   Glucose-Capillary 88 70 - 99 mg/dL  Basic metabolic panel Status: Abnormal   Collection Time: 06/28/14 8:10 PM  Result Value Ref Range   Sodium 134 (L) 137 - 147 mEq/L   Potassium 3.6 (L) 3.7 - 5.3 mEq/L   Chloride 96 96 - 112 mEq/L   CO2 22 19 - 32 mEq/L   Glucose, Bld 91 70 - 99 mg/dL   BUN 23 6 - 23 mg/dL   Creatinine, Ser 0.82 0.50 - 1.35 mg/dL   Calcium 9.1 8.4 - 10.5 mg/dL   GFR calc non Af Amer 78 (L) >90 mL/min   GFR calc Af Amer >90 >90 mL/min   Anion gap 16 (H) 5 - 15  Glucose, capillary Status: None   Collection Time: 06/28/14 8:22 PM  Result Value Ref Range   Glucose-Capillary 87 70 - 99 mg/dL  Glucose, capillary Status: None   Collection Time: 06/28/14 11:54 PM  Result Value Ref Range   Glucose-Capillary 82 70 - 99 mg/dL  Glucose, capillary Status: None   Collection Time: 06/29/14 3:53 AM  Result Value Ref Range   Glucose-Capillary 94 70 - 99 mg/dL  CBC Status: Abnormal   Collection Time: 06/29/14 5:45 AM  Result Value Ref Range   WBC 20.3 (H)  4.0 - 10.5 K/uL   RBC 4.10 (L) 4.22 - 5.81 MIL/uL   Hemoglobin 11.8 (L) 13.0 - 17.0 g/dL   HCT 35.5 (L) 39.0 - 52.0 %   MCV 86.6 78.0 - 100.0 fL   MCH 28.8 26.0 - 34.0 pg   MCHC 33.2 30.0 - 36.0 g/dL   RDW 12.8 11.5 - 15.5 %   Platelets 344 150 - 400 K/uL  Basic metabolic panel Status: Abnormal   Collection Time: 06/29/14 5:45 AM  Result Value Ref Range   Sodium 137 137 - 147 mEq/L   Potassium 3.9 3.7 - 5.3 mEq/L   Chloride 98 96 - 112 mEq/L   CO2 21 19 - 32 mEq/L   Glucose, Bld 101 (H) 70 - 99 mg/dL   BUN 25 (H) 6 - 23 mg/dL   Creatinine, Ser 0.83 0.50 - 1.35 mg/dL   Calcium 9.1 8.4 - 10.5 mg/dL   GFR calc non Af Amer 77 (L) >90 mL/min   GFR calc Af Amer 90 (L) >90 mL/min   Anion gap 18 (H) 5 - 15      Imaging Results (Last 48 hours)    Ct Angio Head W/cm &/or Wo Cm  06/28/2014 CLINICAL DATA: Nursing home patient,  last seen at baseline earlier today, new onset RIGHT-sided hemi paresis. Recent diagnosis of pneumonia. EXAM: CT ANGIOGRAPHY HEAD AND NECK TECHNIQUE: Multidetector CT imaging of the head and neck was performed using the standard protocol during bolus administration of intravenous contrast. Multiplanar CT image reconstructions and MIPs were obtained to evaluate the vascular anatomy. Carotid stenosis measurements (when applicable) are obtained utilizing NASCET criteria, using the distal internal carotid diameter as the denominator. CONTRAST: 125mL OMNIPAQUE IOHEXOL 350 MG/ML SOLN COMPARISON: CT of the head June 27, 2014 at FINDINGS: CTA HEAD FINDINGS Moderately motion degraded evaluation at the skullbase and, circle of Willis. Anterior circulation: Normal appearance of the cervical internal carotid arteries, petrous, cavernous and supra clinoid internal carotid arteries. Widely patent anterior communicating artery. Normal appearance of the anterior and middle cerebral arteries.  Posterior circulation: LEFT vertebral artery is dominant with normal appearance of the vertebral arteries, vertebrobasilar junction and basilar artery. Due to motion, limited assessment of the basilar artery and branch vessels. Normal appearance of the posterior cerebral arteries. Mild luminal irregularity of the mid to distal intracranial arteries most consistent with atherosclerosis No large vessel occlusion, hemodynamically significant stenosis, dissection, contrast extravasation or aneurysm within the anterior nor posterior circulation. No abnormal intraparenchymal or extra-axial enhancement. Review of the MIP images confirms the above findings. CTA NECK FINDINGS Normal appearance of the thoracic arch, normal branch pattern. Mild calcific atherosclerosis of the aortic arch. The origins of the innominate, left Common carotid artery and subclavian artery are widely patent. Streak artifact from retained LEFT subclavian venous contrast results in spurious hypodensity LEFT Common carotid artery proximally. Bilateral Common carotid arteries are widely patent, coursing in a straight line fashion. Normal appearance of the carotid bifurcations without hemodynamically significant stenosis by NASCET criteria. Normal appearance of the included internal carotid arteries. Left vertebral artery is dominant. Calcific atherosclerosis of the LEFT vertebral artery origin without hemodynamically significant stenosis. Tortuous LEFT vertebral artery V1 segment, with focal luminal irregularity and narrowing, coronal 74/239 and, slight poststenotic dilatation. Mild irregularity of the LEFT V2 segment, coronal 70/239. No hemodynamically significant stenosis by NASCET criteria. No dissection, no pseudoaneurysm. No abnormal luminal irregularity. No contrast extravasation. Soft tissues are nonsuspicious. No acute osseous process though bone windows have not been submitted. Included view of the chest demonstrate centrilobular  emphysema and calcified apical pleural plaques with fibronodular scarring. Review of the MIP images confirms the above findings. IMPRESSION: CTA HEAD: Moderately motion degraded examination limits evaluation without large vessel occlusion. Mild luminal irregularity in a pattern suggesting atherosclerosis. CTA NECK: Calcific atherosclerosis of the carotid bulbs without hemodynamically significant stenosis by NASCET criteria. LEFT V1 and V2 intimal irregularity suggesting remote dissection without convincing evidence of acute vascular injury nor flow-limiting stenosis. Electronically Signed By: Elon Alas On: 06/28/2014 01:02   Dg Chest 2 View  06/27/2014 CLINICAL DATA: Productive cough. EXAM: CHEST 2 VIEW COMPARISON: 08/11/2011. FINDINGS: Mediastinum and hilar structures normal. Right lower in the low lobe infiltrate consistent with pneumonia. Left lung is clear. Biapical pleural parenchymal thickening consistent scarring. Heart size normal. No acute bony abnormality. Degenerative changes thoracic spine. IMPRESSION: Right lower lobe and right middle lobe infiltrates consistent with pneumonia. Electronically Signed By: Marcello Moores Register On: 06/27/2014 12:47   Ct Head (brain) Wo Contrast  06/27/2014 CLINICAL DATA: 80 year old male with acute right arm weakness and facial droop. Code stroke. Initial encounter. EXAM: CT HEAD WITHOUT CONTRAST TECHNIQUE: Contiguous axial images were obtained from the base of the skull through the vertex without intravenous contrast. COMPARISON: None. FINDINGS:  Mild cerebral atrophy and moderate -severe chronic appearing small-vessel white matter ischemic changes are identified. A right basal ganglia lacunar infarct appears remote. No acute intracranial abnormalities are identified, including mass lesion or mass effect, hydrocephalus, extra-axial fluid collection, midline shift, hemorrhage, or acute infarction. The visualized bony calvarium is  unremarkable. Right maxillary sinus mucosal thickening is noted. IMPRESSION: No evidence of acute intracranial abnormality. Atrophy, chronic small-vessel white matter ischemic changes and remote appearing right basal ganglia lacunar infarct. Critical Value/emergent results were called by telephone at the time of interpretation on 06/27/2014 at 11:56 pm to Dr. Julianne Rice , who verbally acknowledged these results. Electronically Signed By: Hassan Rowan M.D. On: 06/27/2014 23:57   Ct Angio Neck W/cm &/or Wo/cm  06/28/2014 CLINICAL DATA: Nursing home patient, last seen at baseline earlier today, new onset RIGHT-sided hemi paresis. Recent diagnosis of pneumonia. EXAM: CT ANGIOGRAPHY HEAD AND NECK TECHNIQUE: Multidetector CT imaging of the head and neck was performed using the standard protocol during bolus administration of intravenous contrast. Multiplanar CT image reconstructions and MIPs were obtained to evaluate the vascular anatomy. Carotid stenosis measurements (when applicable) are obtained utilizing NASCET criteria, using the distal internal carotid diameter as the denominator. CONTRAST: 138mL OMNIPAQUE IOHEXOL 350 MG/ML SOLN COMPARISON: CT of the head June 27, 2014 at FINDINGS: CTA HEAD FINDINGS Moderately motion degraded evaluation at the skullbase and, circle of Willis. Anterior circulation: Normal appearance of the cervical internal carotid arteries, petrous, cavernous and supra clinoid internal carotid arteries. Widely patent anterior communicating artery. Normal appearance of the anterior and middle cerebral arteries. Posterior circulation: LEFT vertebral artery is dominant with normal appearance of the vertebral arteries, vertebrobasilar junction and basilar artery. Due to motion, limited assessment of the basilar artery and branch vessels. Normal appearance of the posterior cerebral arteries. Mild luminal irregularity of the mid to distal intracranial arteries most consistent  with atherosclerosis No large vessel occlusion, hemodynamically significant stenosis, dissection, contrast extravasation or aneurysm within the anterior nor posterior circulation. No abnormal intraparenchymal or extra-axial enhancement. Review of the MIP images confirms the above findings. CTA NECK FINDINGS Normal appearance of the thoracic arch, normal branch pattern. Mild calcific atherosclerosis of the aortic arch. The origins of the innominate, left Common carotid artery and subclavian artery are widely patent. Streak artifact from retained LEFT subclavian venous contrast results in spurious hypodensity LEFT Common carotid artery proximally. Bilateral Common carotid arteries are widely patent, coursing in a straight line fashion. Normal appearance of the carotid bifurcations without hemodynamically significant stenosis by NASCET criteria. Normal appearance of the included internal carotid arteries. Left vertebral artery is dominant. Calcific atherosclerosis of the LEFT vertebral artery origin without hemodynamically significant stenosis. Tortuous LEFT vertebral artery V1 segment, with focal luminal irregularity and narrowing, coronal 74/239 and, slight poststenotic dilatation. Mild irregularity of the LEFT V2 segment, coronal 70/239. No hemodynamically significant stenosis by NASCET criteria. No dissection, no pseudoaneurysm. No abnormal luminal irregularity. No contrast extravasation. Soft tissues are nonsuspicious. No acute osseous process though bone windows have not been submitted. Included view of the chest demonstrate centrilobular emphysema and calcified apical pleural plaques with fibronodular scarring. Review of the MIP images confirms the above findings. IMPRESSION: CTA HEAD: Moderately motion degraded examination limits evaluation without large vessel occlusion. Mild luminal irregularity in a pattern suggesting atherosclerosis. CTA NECK: Calcific atherosclerosis of the carotid bulbs without  hemodynamically significant stenosis by NASCET criteria. LEFT V1 and V2 intimal irregularity suggesting remote dissection without convincing evidence of acute vascular injury nor flow-limiting stenosis. Electronically Signed  By: Elon Alas On: 06/28/2014 01:02   Mr Brain Wo Contrast  06/28/2014 CLINICAL DATA: Right-sided facial droop and weakness beginning yesterday. NIH stroke scale of 14. EXAM: MRI HEAD WITHOUT CONTRAST MRA HEAD WITHOUT CONTRAST TECHNIQUE: Multiplanar, multiecho pulse sequences of the brain and surrounding structures were obtained without intravenous contrast. Angiographic images of the head were obtained using MRA technique without contrast. COMPARISON: CT head without contrast 06/27/2014. CTA head and neck 06/28/2014. FINDINGS: MRI HEAD FINDINGS Diffusion-weighted images demonstrate an acute nonhemorrhagic left paramedian pontine infarct measures 9 x 13 mm. T2 changes are associated with the area of acute infarction. Study is mildly degraded by patient motion. Extensive confluent periventricular and subcortical white matter changes are present in addition. Remote lacunar infarcts are evident in the basal ganglia bilaterally. There is a remote lacunar infarct in the subcortical left parietal lobe white matter as well. The ventricles are proportionate to the degree of atrophy. No significant extraaxial fluid collection is present. Flow is present in the major intracranial arteries. The patient is status post bilateral lens replacements. The right maxillary sinus is mostly opacified. Mild mucosal thickening is worse on the right ethmoid air cells. The mastoid air cells are clear. MRA HEAD FINDINGS The A1 and M1 segments are normal. Anterior communicating artery is patent. The A2 segments are duplicated on the left, a normal variant. The MCA bifurcations are within normal limits. The branch vessels demonstrate mild distal small vessel attenuation. The left vertebral  artery is slightly dominant to the right. Mild atherosclerotic changes are noted within the this vertebral arteries without a significant stenosis relative to the more distal vessel. There is mild narrowing of the distal basilar artery without a significant stenosis. Both posterior cerebral arteries originate from the basilar tip. There is some attenuation of distal PCA branch vessels bilaterally, worse on the right. IMPRESSION: 1. Acute nonhemorrhagic left paramedian pontine infarct corresponds with the patient's symptoms. 2. No acute supratentorial infarcts. 3. Extensive atrophy and white matter disease likely reflects the sequela of chronic microvascular ischemia. 4. Atherosclerotic changes in the vertebral arteries and basilar artery without a significant stenosis. 5. Mild distal small vessel attenuation throughout, worse in the posterior circulation. These results will be called to the ordering clinician or representative by the Radiologist Assistant, and communication documented in the PACS or zVision Dashboard. Electronically Signed By: Lawrence Santiago M.D. On: 06/28/2014 10:07   Dg Chest Port 1 View  06/28/2014 CLINICAL DATA: Acute onset of weakness. Initial encounter. EXAM: PORTABLE CHEST - 1 VIEW COMPARISON: Chest radiograph performed 06/27/2014 FINDINGS: The lungs are well-aerated. Worsening right basilar airspace opacification is compatible with pneumonia. There is no evidence of pleural effusion or pneumothorax. The cardiomediastinal silhouette is within normal limits. No acute osseous abnormalities are seen. IMPRESSION: Mildly worsening right basilar pneumonia noted. Electronically Signed By: Garald Balding M.D. On: 06/28/2014 01:38   Mr Jodene Nam Head/brain Wo Cm  06/28/2014 CLINICAL DATA: Right-sided facial droop and weakness beginning yesterday. NIH stroke scale of 14. EXAM: MRI HEAD WITHOUT CONTRAST MRA HEAD WITHOUT CONTRAST TECHNIQUE: Multiplanar, multiecho pulse  sequences of the brain and surrounding structures were obtained without intravenous contrast. Angiographic images of the head were obtained using MRA technique without contrast. COMPARISON: CT head without contrast 06/27/2014. CTA head and neck 06/28/2014. FINDINGS: MRI HEAD FINDINGS Diffusion-weighted images demonstrate an acute nonhemorrhagic left paramedian pontine infarct measures 9 x 13 mm. T2 changes are associated with the area of acute infarction. Study is mildly degraded by patient motion. Extensive confluent periventricular and subcortical  white matter changes are present in addition. Remote lacunar infarcts are evident in the basal ganglia bilaterally. There is a remote lacunar infarct in the subcortical left parietal lobe white matter as well. The ventricles are proportionate to the degree of atrophy. No significant extraaxial fluid collection is present. Flow is present in the major intracranial arteries. The patient is status post bilateral lens replacements. The right maxillary sinus is mostly opacified. Mild mucosal thickening is worse on the right ethmoid air cells. The mastoid air cells are clear. MRA HEAD FINDINGS The A1 and M1 segments are normal. Anterior communicating artery is patent. The A2 segments are duplicated on the left, a normal variant. The MCA bifurcations are within normal limits. The branch vessels demonstrate mild distal small vessel attenuation. The left vertebral artery is slightly dominant to the right. Mild atherosclerotic changes are noted within the this vertebral arteries without a significant stenosis relative to the more distal vessel. There is mild narrowing of the distal basilar artery without a significant stenosis. Both posterior cerebral arteries originate from the basilar tip. There is some attenuation of distal PCA branch vessels bilaterally, worse on the right. IMPRESSION: 1. Acute nonhemorrhagic left paramedian pontine infarct corresponds with the  patient's symptoms. 2. No acute supratentorial infarcts. 3. Extensive atrophy and white matter disease likely reflects the sequela of chronic microvascular ischemia. 4. Atherosclerotic changes in the vertebral arteries and basilar artery without a significant stenosis. 5. Mild distal small vessel attenuation throughout, worse in the posterior circulation. These results will be called to the ordering clinician or representative by the Radiologist Assistant, and communication documented in the PACS or zVision Dashboard. Electronically Signed By: Lawrence Santiago M.D. On: 06/28/2014 10:07     Assessment/Plan: Diagnosis: left paramedian pontine infarct 1. Does the need for close, 24 hr/day medical supervision in concert with the patient's rehab needs make it unreasonable for this patient to be served in a less intensive setting? Yes and Potentially 2. Co-Morbidities requiring supervision/potential complications: pneumonia, htn 3. Due to bladder management, bowel management, safety, skin/wound care, disease management, medication administration, pain management and patient education, does the patient require 24 hr/day rehab nursing? Yes 4. Does the patient require coordinated care of a physician, rehab nurse, PT (1-2 hrs/day, 5 days/week), OT (1-2 hrs/day, 5 days/week) and SLP (1-2 hrs/day, 5 days/week) to address physical and functional deficits in the context of the above medical diagnosis(es)? Yes Addressing deficits in the following areas: balance, endurance, locomotion, strength, transferring, bowel/bladder control, bathing, dressing, feeding, grooming, toileting, cognition, speech, language, swallowing and psychosocial support 5. Can the patient actively participate in an intensive therapy program of at least 3 hrs of therapy per day at least 5 days per week? Yes 6. The potential for patient to make measurable gains while on inpatient rehab is excellent 7. Anticipated functional outcomes upon  discharge from inpatient rehab are min assist with PT, min assist and mod assist with OT, supervision and min assist with SLP. 8. Estimated rehab length of stay to reach the above functional goals is: 24-30 days 9. Does the patient have adequate social supports and living environment to accommodate these discharge functional goals? Yes and Potentially 10. Anticipated D/C setting: Home 11. Anticipated post D/C treatments: HH therapy and Outpatient therapy 12. Overall Rehab/Functional Prognosis: good  RECOMMENDATIONS: This patient's condition is appropriate for continued rehabilitative care in the following setting: CIR Patient has agreed to participate in recommended program. Yes Note that insurance prior authorization may be required for reimbursement for recommended care.  Comment: Pt has a supportive therapy, and he was quite active prior to this hospitilization. Rehab Admissions Coordinator to follow up.  Thanks,  Meredith Staggers, MD, [facility]     06/29/2014       Revision History     Date/Time User Provider Type Action   06/29/2014 11:01 AM Meredith Staggers, MD Physician Sign   06/29/2014 9:13 AM Bary Leriche, PA-C Physician Assistant Share   View Details Report       Routing History

## 2014-07-02 NOTE — Progress Notes (Signed)
PT Cancellation Note  Patient Details Name: [name] MRN: [id number] DOB: 1925/12/16   Cancelled Treatment:    Reason Treat Not Completed: Patient at procedure or test/unavailable--pt just maximoved back to bed with nursing assist. SLP present in room. Will attempt to see later today after pt has rested. He is "exhausted."   Lynx Goodrich 07/02/2014, 11:20 AM Pager [phone]

## 2014-07-02 NOTE — PMR Pre-admission (Signed)
PMR Admission Coordinator Pre-Admission Assessment  Patient: [name] is an 80 y.o., male MRN: [id number] DOB: March 24, 1926 Height: 5\' 9"  (175.3 cm) Weight: 68.04 kg (150 lb)              Insurance Information HMO:     PPO:      PCP:      IPA:      80/20: yes     OTHER:  No HMO PRIMARY: Medicare a and b      Policy#: [id number]      Subscriber: pt Benefits:  Phone #: palmetto online     Name: 06/29/14 Eff. Date: a 01/24/93 b 07/27/93     Deduct: $1260      Out of Pocket Max: none      Life Max: none CIR: 100%      SNF: 20 full days Outpatient: 80%     Co-Pay: 20% Home Health: 100%      Co-Pay: none DME: 80%     Co-Pay: 20% Providers: pt choice  SECONDARY: [employer] of [place] supplement      Policy#: [id number]      Subscriber: pt No auth with medicare primary  Medicaid Application Date:       Case Manager:  Disability Application Date:       Case Worker:   Emergency Contact Information Contact Information    Name Relation Home Work Mobile   Kovar,Agnes Spouse Ko Vaya    [phone]     Current Medical History  Patient Admitting Diagnosis: Left pontine infarct  History of Present Illness:[name] is a 80 y.o. left handed male with history of HTN, prostate cancer, recent PNA who was admitted on 06/27/14 with right sided weakness with right gaze preference and difficulty talking. Patient was noted to be in A fib in setting of CAP and CT head without acute changes. NIHSS- 14. 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 2D echo with EF 55-60% with mild focal basal atrophy and grade 1 diastolic dysfunction. Carotid dopplers without significant stenosis. 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 Also started  on a statin.  PNA likely aspiration. He was initially started on Levaquin. His white count was high and then started improving and is now fluctuating. He is afebrile. Saturating well on oxygen. He'll be prescribed some Robitussin to help him expectorate. He was changed over to oral Augmentin. This can be continued for a total course of 10 days. Recommend repeating chest x-ray in 4-6 weeks.   Total: 13 NIH    Past Medical History  Past Medical History  Diagnosis Date  . Hypertension   . Cancer     Family History  family history includes CAD in his father.  Prior Rehab/Hospitalizations: none   Current Medications  Current facility-administered medications: 0.9 % NaCl with KCl 20 mEq/ L  infusion, , Intravenous, Continuous, Bonnielee Haff, MD, Last Rate: 75 mL/hr at 07/01/14 0953, 1,000 mL at 07/01/14 0953;  amoxicillin-clavulanate (AUGMENTIN) 400-57 MG/5ML suspension 800 mg, 800 mg, Oral, Q12H, Bonnielee Haff, MD, 800 mg at 07/02/14 1013 [DISCONTINUED] aspirin suppository 300 mg, 300 mg, Rectal, Daily, 300 mg at 06/29/14 1019 **OR** aspirin tablet 325 mg, 325 mg, Oral, Daily, Rise Patience, MD, 325 mg at 07/02/14 1013;  atorvastatin (LIPITOR) tablet 20 mg, 20 mg, Oral, q1800, Bonnielee Haff, MD, 20 mg at 07/01/14 1711;  enoxaparin (LOVENOX) injection 40 mg, 40 mg, Subcutaneous, Daily, Rise Patience, MD, 40 mg at 07/02/14 1013 guaifenesin (ROBITUSSIN) 100 MG/5ML syrup 200 mg, 200 mg, Oral, QID, Bonnielee Haff, MD;  ibuprofen (ADVIL,MOTRIN) tablet 200 mg, 200 mg, Oral, Q6H PRN, Ritta Slot, NP, 200 mg at 06/29/14 2206;  magic mouthwash, 10 mL, Oral, TID, Bonnielee Haff, MD;  Reading, , Oral, PRN, Bonnielee Haff, MD;  senna-docusate (Senokot-S) tablet 1 tablet, 1 tablet, Oral, QHS PRN, Rise Patience, MD  Patients Current Diet: DIET - DYS 1 with nectar thick liquids  Precautions / Restrictions Precautions Precautions: Fall Precaution Comments: R side  weakness and neglect Restrictions Weight Bearing Restrictions: No   Prior Activity Level Community (5-7x/wk): Pt I without AD pta, drove, active. [facility] at [facility] and [place]. Plays gold . Plays golf. Had just returned from second home where he has been for two months.   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   Current Functional Level Cognition  Arousal/Alertness: Lethargic Overall Cognitive Status: Within Functional Limits for tasks assessed Orientation Level: Oriented X4 Following Commands: Follows one step commands inconsistently General Comments: R side weakness with maybe beginning of pusher syndrome starting  Attention: Sustained Sustained Attention: Impaired Sustained Attention Impairment: Verbal basic Memory: Impaired Memory Impairment: Decreased recall of new information Problem Solving: Impaired Problem Solving Impairment: Functional basic Safety/Judgment: Impaired    Extremity Assessment (includes Sensation/Coordination)          ADLs  Overall ADL's : Needs assistance/impaired Grooming: Wash/dry face, Oral care, Minimal assistance, Sitting Grooming Details (indicate cue type and reason): Pt able to brush teeth with setup provided but required min (A) to fully clear incr sputum from mouth which was causing coughing/choking. Pt able to dry mouth with setup and verbal cues. Upper Body Dressing : Moderate assistance, Bed level Upper Body Dressing Details (indicate cue type and reason): Mod (A) to thread RUE. Toileting- Clothing Manipulation and Hygiene: Maximal assistance, +2 for physical assistance, Bed level Toileting - Clothing Manipulation Details (indicate cue type and reason): Pt soiled on  arrival. Max +2 (A) to complete peri-care at bed level. Functional mobility during ADLs: Total assistance, +2 for physical assistance, +2 for safety/equipment Charlaine Dalton) General ADL Comments: Pt educated and practiced using L hand and leg to move R side. Pt initiated UE weight shift (side-side) while seated EOB. Pt with active movement in R leg--pt pulled R leg back after feeling Stedy on his toes. Pt with good awareness of R extremeties--during stand-pivot transfer pt verbalized that his R hand (IV line) was stuck on Stedy.       Mobility  Overal bed mobility: Needs Assistance Bed Mobility: Rolling, Sidelying to Sit Rolling: Max assist, +2 for physical assistance Sidelying to sit: Max assist, +2 for physical assistance, HOB elevated General bed mobility comments: HOB elevated for aspiration precautions--pt with incr sputum in mouth and had just finished breakfast. Max +2 (A) to complete full roll and to protect RUE and RLE. (A) to descend bil LE off bed and to elevate trunk. (A) to support trunk in sitting position--pt with R lateral lean. Verbal cues to look up--pt with chin down. Verbal  cues to reach for bedrails and sequencing.    Transfers  Overall transfer level: Needs assistance Equipment used:  Charlaine Dalton) Transfers: Sit to/from Stand, [employer] Sit to Stand: Total assist, +2 physical assistance, +2 safety/equipment (Stedy) Stand pivot transfers: Total assist, +2 physical assistance, +2 safety/equipment (Stedy) General transfer comment: Total +2 (A) with bariatric stedy and chuck pad underneath to give extra assist for pushing hips into neutral position. (A) to support RUE and to support for R lateral lean. Verbal cues for hand placement on Stedy and to keep head up.    Ambulation / Gait / Stairs / Wheelchair Mobility  Ambulation/Gait General Gait Details: unable to walk    Posture / Balance Dynamic Sitting Balance Sitting balance - Comments: R lateral lean, chin down     Special needs/care consideration Oxygen O2 at 2 liters [place]. Productive cough. Needs assistance to expectorate Bowel mgmt: incontinent Bladder mgmt: condom catheter    Previous Home Environment Living Arrangements: Spouse/significant other  Lives With: Spouse Available Help at Discharge: Family, Other (Comment) (wife 36 yo on [place]. Either will hire assist at home or pt wi) Type of Home: [place] Name: pt lived at [place]: Two level, Able to live on main level with bedroom/bathroom Alternate Level Stairs-Number of Steps: flight. Couple lives on main level Home Access: Stairs to enter Entrance Stairs-Rails: Left, None (pt left hand dominant) Entrance Stairs-Number of Steps: 4 Bathroom Shower/Tub: Tub/shower unit, Industrial/product designer: Yes How Accessible: Accessible via walker Home Care Services: No Type of Home Care Services: Housekeeping Additional Comments: pt has lived at their second home in [place] for past two months  Discharge Living Setting Plans for Discharge Living Setting: Patient's home, Lives with (comment), Other (Comment) (lives with 31 yo wife) Type of Home at Discharge: House Discharge Home Layout: Two level, Able to live on main level with bedroom/bathroom (pta couple only used main level) Discharge Home Access: Stairs to enter Entrance Stairs-Rails: Left, None (left hand dominant) Entrance Stairs-Number of Steps: 4 Discharge Bathroom Shower/Tub: Tub/shower unit, Curtain Discharge Bathroom Toilet: Standard Discharge Bathroom Accessibility: Yes How Accessible: Accessible via walker Does the patient have any problems obtaining your medications?: No  I have spoken extensively with pt's wife and two daughters concerning plans for d/c. They would have to hire assistance at home for wife unable to provide more than supervision level, or go to SNF from rehab to complete his rehab course. [name] lives in  [place] and other daughter in [place]. They have asked if he could just stay longer on rehab. I discussed average LOS and that we are estimating 24-30 days per rehab consult. I discussed the unlikely extension of his stay past this estimate unless a few days would make the difference of him returning directly home vs SNF.  Social/Family/Support Systems Patient Roles: Spouse, Parent, Other (Comment) (retired [employer]; former Hydrologist ) Sport and exercise psychologist Information: Alistair Senft, wife and Maryelizabeth Rowan, dtr from [place] Anticipated Caregiver: wife and hired caregivers vs SNF to complete his rehab after CIR Anticipated Caregiver's Contact Information: see above Ability/Limitations of Caregiver: wife 41 yo and uses cane Caregiver Availability: 24/7 Discharge Plan Discussed with Primary Caregiver: Yes Is Caregiver In Agreement with Plan?: Yes Does Caregiver/Family have Issues with Lodging/Transportation while Pt is in Rehab?: No  Goals/Additional Needs Patient/Family Goal for Rehab: Mod assist PT and OT, min assist SLP Expected length of stay: [facility] 24- 30 days Cultural Considerations: [place], [place]  [employer] Pt/Family Agrees to Admission and willing to participate: Yes Program Orientation Provided & Reviewed with Pt/Caregiver Including Roles  & Responsibilities: Yes  Decrease burden of Care through IP rehab admission: Diet advancement, Decrease number of caregivers, Bowel and bladder program and Patient/family education. Pt likely will need continued SNF rehab before eventually returning home.  Possible need for SNF placement upon discharge:yes. I have spoken with wife and two daughters concerning the likelihood of SNF to complete his rehab course.  Patient Condition: This patient's medical and functional status has changed since the consult dated: 06/29/2014 in which the Rehabilitation Physician determined and documented that the patient's condition is appropriate for intensive  rehabilitative care in an inpatient rehabilitation facility. See "History of Present Illness" (above) for medical update. Functional changes are: max assist. Patient's medical and functional status update has been discussed with the Rehabilitation physician and patient remains appropriate for inpatient rehabilitation. Will admit to inpatient rehab today.  Preadmission Screen Completed By:  Cleatrice Burke, 07/02/2014 1:06 PM ______________________________________________________________________   Discussed status with Dr. Naaman Plummer on 07/02/2014 at  1305 and received telephone approval for admission today.  Admission Coordinator:  Cleatrice Burke, time 2423 Date 07/02/2014.

## 2014-07-02 NOTE — Discharge Summary (Addendum)
[facility]  Physician Discharge Summary   Patient ID: [name] MRN: [id number] DOB/AGE: 25-Jan-1926 80 y.o.  Admit date: 06/27/2014 Discharge date: 07/02/2014  PCP: Simona Huh, MD  DISCHARGE DIAGNOSES:  Principal Problem:   Stroke Active Problems:   CAP (community acquired pneumonia)   Normocytic anemia   Essential hypertension   Hypokalemia   RECOMMENDATIONS FOR OUTPATIENT FOLLOW UP: 1. Patient is being sent to inpatient rehabilitation  DISCHARGE CONDITION: fair  Diet recommendation: He is on dysphagia 1 diet with nectar thick liquids. Medications to be crushed in pure. No straws.  Filed Weights   06/28/14 0223  Weight: 68.04 kg (150 lb)    INITIAL HISTORY: [name] is a 80 y.o. male with history of hypertension and prostate cancer. He had a productive cough for few days prior to admission. Patient had gone to his PCP on day of admission and was prescribed antibiotics for possible pneumonia. Later patient's family called EMS as they found that patient was having right facial droop with right-sided hemiplegia and was brought to the ER. He has been found to have acute stroke.  Consultants: Neurology  Procedures:  2D ECHO Study Conclusions - Left ventricle: The cavity size was normal. There was mild focalbasal hypertrophy of the septum. Systolic function was normal.The estimated ejection fraction was in the range of 55% to 60%.Doppler parameters are consistent with abnormal left ventricularrelaxation (grade 1 diastolic dysfunction). Impressions: - No cardiac source of emboli was indentified.  Carotid Doppler Preliminary report: 1-39% ICA stenosis. Vertebral artery flow is antegrade.   Antibiotics: Levaquin 12/3-->12/5 Augmentin 12/5-->  HOSPITAL COURSE:   Acute CVA with right-sided hemiplegia Patient was found to have an acute stroke. This caused significant dysarthria, dysphagia and right-sided weakness. He is unable to move his  right upper and lower extremities. He was seen by neurology. He was started on aspirin. Also started on a statin. His speech has improved to some extent. He was seen by physical and occupational therapy and inpatient rehabilitation was recommended. He has been seen by inpatient rehabilitation and has been accepted. He is currently on a dysphagia 1 diet as mentioned above. He will need continued therapy.   Dysphagia secondary to Stroke Started on Dys 1 diet. Tolerating so far.   Abnormal rhythm Initial EKG showed a lot of artifacts and the was a concern about possible arrhythmia. Repeat EKG shows sinus rhyhm. No evidence for atrial fibrillation. First EKG shows artifacts and was discussed with on-call cardiologist. Telemetry shows first degree AV block. No further evaluation is thought to be necessary at this time.  Pneumonia: Community Acquired versus Aspiration This was likely aspiration. He was initially started on Levaquin. His white count was high and then started improving and is now fluctuating. He is afebrile. Saturating well on oxygen. He'll be prescribed some Robitussin to help him expectorate. He was changed over to oral Augmentin. This can be continued for a total course of 10 days. Recommend repeating chest x-ray in 4-6 weeks.  Hypokalemia/Dehydration He was dehydrated and significantly hypokalemic at the time of admission. Magnesium level was normal. This was aggressively repleted. Now his potassium level is normal. Cut back on IV fluids.  Normocytic Anemia Hemoglobin is being stable.   History of Essential Hypertension Currently allowing for permissive hypertension due to acute stroke. Blood pressure has been reasonably controlled. He was on hydrochlorothiazide at home. When this is resumed potassium supplementation should be considered.  History of prostate cancer. Stable  Patient remains stable from a  medical standpoint. He can go to inpatient rehabilitation.  PERTINENT  LABS:  The results of significant diagnostics from this hospitalization (including imaging, microbiology, ancillary and laboratory) are listed below for reference.     Labs: Basic Metabolic Panel:  Recent Labs Lab 06/28/14 0448 06/28/14 2010 06/29/14 0545 06/30/14 0900 07/01/14 0600  NA 134* 134* 137 140 142  K 2.9* 3.6* 3.9 3.9 4.4  CL 92* 96 98 104 108  CO2 22 22 21 24 22   GLUCOSE 78 91 101* 115* 121*  BUN 27* 23 25* 27* 24*  CREATININE 0.80 0.82 0.83 0.77 0.72  CALCIUM 9.2 9.1 9.1 9.1 8.6  MG 2.3  --   --   --   --    Liver Function Tests:  Recent Labs Lab 06/27/14 2334 06/28/14 0448  AST 21 18  ALT 32 28  ALKPHOS 155* 149*  BILITOT 0.5 0.6  PROT 7.1 6.5  ALBUMIN 2.7* 2.5*   CBC:  Recent Labs Lab 06/27/14 2334 06/27/14 2349 06/28/14 0448 06/29/14 0545 06/30/14 0900 07/01/14 0600  WBC 18.8*  --  17.6* 20.3* 15.6* 17.1*  NEUTROABS 15.9*  --  15.0*  --   --  14.9*  HGB 12.1* 13.6 11.8* 11.8* 11.9* 11.3*  HCT 36.3* 40.0 35.8* 35.5* 36.6* 35.1*  MCV 85.4  --  86.7 86.6 87.1 87.8  PLT 381  --  376 344 342 353   CBG:  Recent Labs Lab 06/28/14 2022 06/28/14 2354 06/29/14 0353 06/29/14 1200 06/29/14 1738  GLUCAP 87 82 94 106* 105*     IMAGING STUDIES Ct Angio Head W/cm &/or Wo Cm  06/28/2014   CLINICAL DATA:  Nursing home patient, last seen at baseline earlier today, new onset RIGHT-sided hemi paresis. Recent diagnosis of pneumonia.  EXAM: CT ANGIOGRAPHY HEAD AND NECK  TECHNIQUE: Multidetector CT imaging of the head and neck was performed using the standard protocol during bolus administration of intravenous contrast. Multiplanar CT image reconstructions and MIPs were obtained to evaluate the vascular anatomy. Carotid stenosis measurements (when applicable) are obtained utilizing NASCET criteria, using the distal internal carotid diameter as the denominator.  CONTRAST:  131mL OMNIPAQUE IOHEXOL 350 MG/ML SOLN  COMPARISON:  CT of the head June 27, 2014  at  FINDINGS: CTA HEAD FINDINGS  Moderately motion degraded evaluation at the skullbase and, circle of Willis. Anterior circulation: Normal appearance of the cervical internal carotid arteries, petrous, cavernous and supra clinoid internal carotid arteries. Widely patent anterior communicating artery. Normal appearance of the anterior and middle cerebral arteries.  Posterior circulation: LEFT vertebral artery is dominant with normal appearance of the vertebral arteries, vertebrobasilar junction and basilar artery. Due to motion, limited assessment of the basilar artery and branch vessels. Normal appearance of the posterior cerebral arteries.  Mild luminal irregularity of the mid to distal intracranial arteries most consistent with atherosclerosis No large vessel occlusion, hemodynamically significant stenosis, dissection, contrast extravasation or aneurysm within the anterior nor posterior circulation.  No abnormal intraparenchymal or extra-axial enhancement.  Review of the MIP images confirms the above findings.  CTA NECK FINDINGS  Normal appearance of the thoracic arch, normal branch pattern. Mild calcific atherosclerosis of the aortic arch. The origins of the innominate, left Common carotid artery and subclavian artery are widely patent. Streak artifact from retained LEFT subclavian venous contrast results in spurious hypodensity LEFT Common carotid artery proximally.  Bilateral Common carotid arteries are widely patent, coursing in a straight line fashion. Normal appearance of the carotid bifurcations without hemodynamically significant stenosis  by NASCET criteria. Normal appearance of the included internal carotid arteries.  Left vertebral artery is dominant. Calcific atherosclerosis of the LEFT vertebral artery origin without hemodynamically significant stenosis. Tortuous LEFT vertebral artery V1 segment, with focal luminal irregularity and narrowing, coronal 74/239 and, slight poststenotic dilatation. Mild  irregularity of the LEFT V2 segment, coronal 70/239.  No hemodynamically significant stenosis by NASCET criteria. No dissection, no pseudoaneurysm. No abnormal luminal irregularity. No contrast extravasation.  Soft tissues are nonsuspicious. No acute osseous process though bone windows have not been submitted. Included view of the chest demonstrate centrilobular emphysema and calcified apical pleural plaques with fibronodular scarring.  Review of the MIP images confirms the above findings.  IMPRESSION: CTA HEAD: Moderately motion degraded examination limits evaluation without large vessel occlusion. Mild luminal irregularity in a pattern suggesting atherosclerosis.  CTA NECK: Calcific atherosclerosis of the carotid bulbs without hemodynamically significant stenosis by NASCET criteria.  LEFT V1 and V2 intimal irregularity suggesting remote dissection without convincing evidence of acute vascular injury nor flow-limiting stenosis.   Electronically Signed   By: Elon Alas   On: 06/28/2014 01:02   Dg Chest 2 View  06/27/2014   CLINICAL DATA:  Productive cough.  EXAM: CHEST  2 VIEW  COMPARISON:  08/11/2011.  FINDINGS: Mediastinum and hilar structures normal. Right lower in the low lobe infiltrate consistent with pneumonia. Left lung is clear. Biapical pleural parenchymal thickening consistent scarring. Heart size normal. No acute bony abnormality. Degenerative changes thoracic spine.  IMPRESSION: Right lower lobe and right middle lobe infiltrates consistent with pneumonia.   Electronically Signed   By: Marcello Moores  Register   On: 06/27/2014 12:47   Ct Head (brain) Wo Contrast  06/27/2014   CLINICAL DATA:  80 year old male with acute right arm weakness and facial droop. Code stroke. Initial encounter.  EXAM: CT HEAD WITHOUT CONTRAST  TECHNIQUE: Contiguous axial images were obtained from the base of the skull through the vertex without intravenous contrast.  COMPARISON:  None.  FINDINGS: Mild cerebral atrophy and  moderate -severe chronic appearing small-vessel white matter ischemic changes are identified.  A right basal ganglia lacunar infarct appears remote.  No acute intracranial abnormalities are identified, including mass lesion or mass effect, hydrocephalus, extra-axial fluid collection, midline shift, hemorrhage, or acute infarction.  The visualized bony calvarium is unremarkable.  Right maxillary sinus mucosal thickening is noted.  IMPRESSION: No evidence of acute intracranial abnormality.  Atrophy, chronic small-vessel white matter ischemic changes and remote appearing right basal ganglia lacunar infarct.  Critical Value/emergent results were called by telephone at the time of interpretation on 06/27/2014 at 11:56 pm to Dr. Julianne Rice , who verbally acknowledged these results.   Electronically Signed   By: Hassan Rowan M.D.   On: 06/27/2014 23:57   Ct Angio Neck W/cm &/or Wo/cm  06/28/2014   CLINICAL DATA:  Nursing home patient, last seen at baseline earlier today, new onset RIGHT-sided hemi paresis. Recent diagnosis of pneumonia.  EXAM: CT ANGIOGRAPHY HEAD AND NECK  TECHNIQUE: Multidetector CT imaging of the head and neck was performed using the standard protocol during bolus administration of intravenous contrast. Multiplanar CT image reconstructions and MIPs were obtained to evaluate the vascular anatomy. Carotid stenosis measurements (when applicable) are obtained utilizing NASCET criteria, using the distal internal carotid diameter as the denominator.  CONTRAST:  16mL OMNIPAQUE IOHEXOL 350 MG/ML SOLN  COMPARISON:  CT of the head June 27, 2014 at  FINDINGS: CTA HEAD FINDINGS  Moderately motion degraded evaluation at the skullbase and,  circle of Willis. Anterior circulation: Normal appearance of the cervical internal carotid arteries, petrous, cavernous and supra clinoid internal carotid arteries. Widely patent anterior communicating artery. Normal appearance of the anterior and middle cerebral arteries.   Posterior circulation: LEFT vertebral artery is dominant with normal appearance of the vertebral arteries, vertebrobasilar junction and basilar artery. Due to motion, limited assessment of the basilar artery and branch vessels. Normal appearance of the posterior cerebral arteries.  Mild luminal irregularity of the mid to distal intracranial arteries most consistent with atherosclerosis No large vessel occlusion, hemodynamically significant stenosis, dissection, contrast extravasation or aneurysm within the anterior nor posterior circulation.  No abnormal intraparenchymal or extra-axial enhancement.  Review of the MIP images confirms the above findings.  CTA NECK FINDINGS  Normal appearance of the thoracic arch, normal branch pattern. Mild calcific atherosclerosis of the aortic arch. The origins of the innominate, left Common carotid artery and subclavian artery are widely patent. Streak artifact from retained LEFT subclavian venous contrast results in spurious hypodensity LEFT Common carotid artery proximally.  Bilateral Common carotid arteries are widely patent, coursing in a straight line fashion. Normal appearance of the carotid bifurcations without hemodynamically significant stenosis by NASCET criteria. Normal appearance of the included internal carotid arteries.  Left vertebral artery is dominant. Calcific atherosclerosis of the LEFT vertebral artery origin without hemodynamically significant stenosis. Tortuous LEFT vertebral artery V1 segment, with focal luminal irregularity and narrowing, coronal 74/239 and, slight poststenotic dilatation. Mild irregularity of the LEFT V2 segment, coronal 70/239.  No hemodynamically significant stenosis by NASCET criteria. No dissection, no pseudoaneurysm. No abnormal luminal irregularity. No contrast extravasation.  Soft tissues are nonsuspicious. No acute osseous process though bone windows have not been submitted. Included view of the chest demonstrate centrilobular  emphysema and calcified apical pleural plaques with fibronodular scarring.  Review of the MIP images confirms the above findings.  IMPRESSION: CTA HEAD: Moderately motion degraded examination limits evaluation without large vessel occlusion. Mild luminal irregularity in a pattern suggesting atherosclerosis.  CTA NECK: Calcific atherosclerosis of the carotid bulbs without hemodynamically significant stenosis by NASCET criteria.  LEFT V1 and V2 intimal irregularity suggesting remote dissection without convincing evidence of acute vascular injury nor flow-limiting stenosis.   Electronically Signed   By: Elon Alas   On: 06/28/2014 01:02   Mr Brain Wo Contrast  06/28/2014   CLINICAL DATA:  Right-sided facial droop and weakness beginning yesterday. NIH stroke scale of 14.  EXAM: MRI HEAD WITHOUT CONTRAST  MRA HEAD WITHOUT CONTRAST  TECHNIQUE: Multiplanar, multiecho pulse sequences of the brain and surrounding structures were obtained without intravenous contrast. Angiographic images of the head were obtained using MRA technique without contrast.  COMPARISON:  CT head without contrast 06/27/2014. CTA head and neck 06/28/2014.  FINDINGS: MRI HEAD FINDINGS  Diffusion-weighted images demonstrate an acute nonhemorrhagic left paramedian pontine infarct measures 9 x 13 mm. T2 changes are associated with the area of acute infarction. Study is mildly degraded by patient motion.  Extensive confluent periventricular and subcortical white matter changes are present in addition. Remote lacunar infarcts are evident in the basal ganglia bilaterally. There is a remote lacunar infarct in the subcortical left parietal lobe white matter as well.  The ventricles are proportionate to the degree of atrophy. No significant extraaxial fluid collection is present.  Flow is present in the major intracranial arteries. The patient is status post bilateral lens replacements. The right maxillary sinus is mostly opacified. Mild mucosal  thickening is worse on the right ethmoid air  cells. The mastoid air cells are clear.  MRA HEAD FINDINGS  The A1 and M1 segments are normal. Anterior communicating artery is patent. The A2 segments are duplicated on the left, a normal variant. The MCA bifurcations are within normal limits. The branch vessels demonstrate mild distal small vessel attenuation.  The left vertebral artery is slightly dominant to the right. Mild atherosclerotic changes are noted within the this vertebral arteries without a significant stenosis relative to the more distal vessel. There is mild narrowing of the distal basilar artery without a significant stenosis. Both posterior cerebral arteries originate from the basilar tip. There is some attenuation of distal PCA branch vessels bilaterally, worse on the right.  IMPRESSION: 1. Acute nonhemorrhagic left paramedian pontine infarct corresponds with the patient's symptoms. 2. No acute supratentorial infarcts. 3. Extensive atrophy and white matter disease likely reflects the sequela of chronic microvascular ischemia. 4. Atherosclerotic changes in the vertebral arteries and basilar artery without a significant stenosis. 5. Mild distal small vessel attenuation throughout, worse in the posterior circulation. These results will be called to the ordering clinician or representative by the Radiologist Assistant, and communication documented in the PACS or zVision Dashboard.   Electronically Signed   By: Lawrence Santiago M.D.   On: 06/28/2014 10:07   Dg Chest Port 1 View  06/28/2014   CLINICAL DATA:  Acute onset of weakness.  Initial encounter.  EXAM: PORTABLE CHEST - 1 VIEW  COMPARISON:  Chest radiograph performed 06/27/2014  FINDINGS: The lungs are well-aerated. Worsening right basilar airspace opacification is compatible with pneumonia. There is no evidence of pleural effusion or pneumothorax.  The cardiomediastinal silhouette is within normal limits. No acute osseous abnormalities are seen.   IMPRESSION: Mildly worsening right basilar pneumonia noted.   Electronically Signed   By: Garald Balding M.D.   On: 06/28/2014 01:38   Dg Swallowing Func-speech Pathology  06/29/2014   [name], CCC-SLP     06/29/2014  4:45 PM Objective Swallowing Evaluation: Modified Barium Swallowing Study   Patient Details  Name: ZURICH CARRENO MRN: [id number] Date of Birth: May 18, 1926  Today's Date: 06/29/2014 Time: 1550-1615 SLP Time Calculation (min) (ACUTE ONLY): 25 min  Past Medical History:  Past Medical History  Diagnosis Date  . Hypertension   . Cancer    Past Surgical History:  Past Surgical History  Procedure Laterality Date  . Appendectomy    . Kidney surgery     HPI:  [name] is a 80 y.o. male with history of hypertension  and prostate cancer. He had a productive cough for few days prior  to admission. Patient had gone to his PCP on day of admission and  was prescribed antibiotics for possible pneumonia. Later  patient's family called EMS as they found that patient was having  right facial droop with right-sided hemiplegia and was brought to  the ER. MRI shows an acute nonhemorrhagic left paramedian pontine  infarct.     Assessment / Plan / Recommendation Clinical Impression  Dysphagia Diagnosis: Moderate oral phase dysphagia;Moderate  pharyngeal phase dysphagia  Clinical impression: Pt has a moderate sensorimotor oropharyngeal  dysphagia. Oral phase is marked by weak manipulation and delayed  transit, requiring intermittent cues for posterior propulsion.  This leads to premature spillage into the pharynx that is  primarily contained in the valleculae, except for with thin  liquids which spill to the pyriform sinuses. This, paired with a  delayed swallow initiation leads to silent penetration with thin  liquids and  large straw sips of nectar thick liquids, with pt  unable to clear penetrates. Thin liquids are ultimately sensed  when they reach, and begin to pass through, the vocal folds. No  airway  penetration is observed with solids or smaller cup sips of  nectar thick liquids, although diffuse weakness leads to  residuals post-swallow. Recommend Dys 1 diet and nectar thick  liquids with full supervision. SLP to continue to follow for  tolerance and advancement.    Treatment Recommendation  Therapy as outlined in treatment plan below    Diet Recommendation Dysphagia 1 (Puree);Nectar-thick liquid   Liquid Administration via: Cup;No straw Medication Administration: Crushed with puree Supervision: Patient able to self feed;Full supervision/cueing  for compensatory strategies Compensations: Slow rate;Small sips/bites;Multiple dry swallows  after each bite/sip;Check for pocketing;Check for anterior loss Postural Changes and/or Swallow Maneuvers: Seated upright 90  degrees;Upright 30-60 min after meal    Other  Recommendations Oral Care Recommendations: Oral care  BID;Oral care before and after PO Other Recommendations: Order thickener from pharmacy;Prohibited  food (jello, ice cream, thin soups);Remove water pitcher;Have  oral suction available   Follow Up Recommendations  Inpatient Rehab;24 hour supervision/assistance    Frequency and Duration min 2x/week  2 weeks   Pertinent Vitals/Pain n/a    SLP Swallow Goals     General Date of Onset: 06/27/14 HPI: RANDON SOMERA is a 80 y.o. male with history of  hypertension and prostate cancer. He had a productive cough for  few days prior to admission. Patient had gone to his PCP on day  of admission and was prescribed antibiotics for possible  pneumonia. Later patient's family called EMS as they found that  patient was having right facial droop with right-sided hemiplegia  and was brought to the ER. MRI shows an acute nonhemorrhagic left  paramedian pontine infarct. Type of Study: Modified Barium Swallowing Study Reason for Referral: Objectively evaluate swallowing function Previous Swallow Assessment: BSE 12/03 recommending NPO pending  MBS Diet Prior to this Study:  NPO Temperature Spikes Noted: No Respiratory Status: Nasal cannula History of Recent Intubation: No Behavior/Cognition: Lethargic;Cooperative;Pleasant mood;Requires  cueing Oral Cavity - Dentition: Missing dentition Oral Motor / Sensory Function: Impaired - see Bedside swallow  eval Self-Feeding Abilities: Able to feed self;Needs assist Patient Positioning: Upright in chair Baseline Vocal Quality: Low vocal intensity Volitional Cough: Weak Volitional Swallow: Able to elicit Anatomy: Within functional limits Pharyngeal Secretions: Not observed secondary MBS    Reason for Referral Objectively evaluate swallowing function   Oral Phase Oral Preparation/Oral Phase Oral Phase: Impaired Oral - Nectar Oral - Nectar Teaspoon: Right anterior bolus loss;Weak lingual  manipulation;Reduced posterior propulsion;Delayed oral  transit;Other (Comment) (unable to adequately transit small  amount of liquid) Oral - Nectar Cup: Right anterior bolus loss;Weak lingual  manipulation;Reduced posterior propulsion;Delayed oral transit Oral - Nectar Straw: Right anterior bolus loss;Weak lingual  manipulation;Reduced posterior propulsion;Delayed oral transit Oral - Thin Oral - Thin Cup: Right anterior bolus loss;Weak lingual  manipulation;Reduced posterior propulsion;Delayed oral transit Oral - Solids Oral - Puree: Right anterior bolus loss;Weak lingual  manipulation;Reduced posterior propulsion;Delayed oral  transit;Right pocketing in lateral sulci;Lingual/palatal  residue;Piecemeal swallowing Oral - Mechanical Soft: Right anterior bolus loss;Weak lingual  manipulation;Reduced posterior propulsion;Delayed oral  transit;Right pocketing in lateral sulci;Lingual/palatal  residue;Piecemeal swallowing;Impaired mastication   Pharyngeal Phase Pharyngeal Phase Pharyngeal Phase: Impaired Pharyngeal - Nectar Pharyngeal - Nectar Cup: Delayed swallow initiation;Reduced  pharyngeal peristalsis;Reduced anterior laryngeal  mobility;Reduced laryngeal  elevation;Reduced tongue base  retraction;Pharyngeal residue - valleculae;Pharyngeal  residue -  posterior pharnyx Penetration/Aspiration details (nectar cup): Material does not  enter airway Pharyngeal - Nectar Straw: Delayed swallow initiation;Reduced  pharyngeal peristalsis;Reduced anterior laryngeal  mobility;Reduced laryngeal elevation;Reduced tongue base  retraction;Penetration/Aspiration during swallow;Pharyngeal  residue - valleculae;Pharyngeal residue - posterior pharnyx Penetration/Aspiration details (nectar straw): Material enters  airway, remains ABOVE vocal cords and not ejected out Pharyngeal - Thin Pharyngeal - Thin Cup: Delayed swallow initiation;Reduced  pharyngeal peristalsis;Reduced anterior laryngeal  mobility;Reduced laryngeal elevation;Reduced tongue base  retraction;Penetration/Aspiration during swallow;Pharyngeal  residue - valleculae;Pharyngeal residue - posterior pharnyx Penetration/Aspiration details (thin cup): Material enters  airway, passes BELOW cords and not ejected out despite cough  attempt by patient Pharyngeal - Solids Pharyngeal - Puree: Delayed swallow initiation;Reduced pharyngeal  peristalsis;Reduced anterior laryngeal mobility;Reduced laryngeal  elevation;Reduced tongue base retraction;Pharyngeal residue -  valleculae;Pharyngeal residue - posterior pharnyx Penetration/Aspiration details (puree): Material does not enter  airway Pharyngeal - Mechanical Soft: Delayed swallow initiation;Reduced  pharyngeal peristalsis;Reduced anterior laryngeal  mobility;Reduced laryngeal elevation;Reduced tongue base  retraction;Pharyngeal residue - valleculae;Pharyngeal residue -  posterior pharnyx  Cervical Esophageal Phase    GO    Cervical Esophageal Phase Cervical Esophageal Phase: [facility]        Germain Osgood, M.A. CCC-SLP [phone]   Germain Osgood 06/29/2014, 4:44 PM    Mr Jodene Nam Head/brain Wo Cm  06/28/2014   CLINICAL DATA:  Right-sided facial droop and weakness beginning yesterday.  NIH stroke scale of 14.  EXAM: MRI HEAD WITHOUT CONTRAST  MRA HEAD WITHOUT CONTRAST  TECHNIQUE: Multiplanar, multiecho pulse sequences of the brain and surrounding structures were obtained without intravenous contrast. Angiographic images of the head were obtained using MRA technique without contrast.  COMPARISON:  CT head without contrast 06/27/2014. CTA head and neck 06/28/2014.  FINDINGS: MRI HEAD FINDINGS  Diffusion-weighted images demonstrate an acute nonhemorrhagic left paramedian pontine infarct measures 9 x 13 mm. T2 changes are associated with the area of acute infarction. Study is mildly degraded by patient motion.  Extensive confluent periventricular and subcortical white matter changes are present in addition. Remote lacunar infarcts are evident in the basal ganglia bilaterally. There is a remote lacunar infarct in the subcortical left parietal lobe white matter as well.  The ventricles are proportionate to the degree of atrophy. No significant extraaxial fluid collection is present.  Flow is present in the major intracranial arteries. The patient is status post bilateral lens replacements. The right maxillary sinus is mostly opacified. Mild mucosal thickening is worse on the right ethmoid air cells. The mastoid air cells are clear.  MRA HEAD FINDINGS  The A1 and M1 segments are normal. Anterior communicating artery is patent. The A2 segments are duplicated on the left, a normal variant. The MCA bifurcations are within normal limits. The branch vessels demonstrate mild distal small vessel attenuation.  The left vertebral artery is slightly dominant to the right. Mild atherosclerotic changes are noted within the this vertebral arteries without a significant stenosis relative to the more distal vessel. There is mild narrowing of the distal basilar artery without a significant stenosis. Both posterior cerebral arteries originate from the basilar tip. There is some attenuation of distal PCA branch vessels  bilaterally, worse on the right.  IMPRESSION: 1. Acute nonhemorrhagic left paramedian pontine infarct corresponds with the patient's symptoms. 2. No acute supratentorial infarcts. 3. Extensive atrophy and white matter disease likely reflects the sequela of chronic microvascular ischemia. 4. Atherosclerotic changes in the vertebral arteries and basilar artery without a significant stenosis. 5. Mild distal small vessel attenuation  throughout, worse in the posterior circulation. These results will be called to the ordering clinician or representative by the Radiologist Assistant, and communication documented in the PACS or zVision Dashboard.   Electronically Signed   By: Lawrence Santiago M.D.   On: 06/28/2014 10:07    DISCHARGE EXAMINATION: Filed Vitals:   07/01/14 2054 07/02/14 0120 07/02/14 0515 07/02/14 0957  BP: 149/62 165/54 141/55 143/57  Pulse: 70 66 56 66  Temp: 98.6 F (37 C) 98.6 F (37 C) 98.3 F (36.8 C) 98.1 F (36.7 C)  TempSrc: Oral Oral Oral Oral  Resp: 20 28 22 20   Height:      Weight:      SpO2: 96% 97% 96% 98%   General appearance: alert, cooperative, appears stated age and no distress Resp: Decreased air entry on the right. No definite rhonchi or wheezing. No crackles. Cardio: regular rate and rhythm, S1, S2 normal, no murmur, click, rub or gallop GI: soft, non-tender; bowel sounds normal; no masses,  no organomegaly Extremities: extremities normal, atraumatic, no cyanosis or edema Neurologic: Right hemiparesis. Dysarthric.  DISPOSITION: Inpatient rehabilitation   ALLERGIES: No Known Allergies  Current Inpatient Medications:  Scheduled: . amoxicillin-clavulanate  800 mg Oral Q12H  . aspirin  325 mg Oral Daily  . atorvastatin  20 mg Oral q1800  . enoxaparin (LOVENOX) injection  40 mg Subcutaneous Daily  . guaifenesin  200 mg Oral QID  . magic mouthwash  10 mL Oral TID   Continuous: . 0.9 % NaCl with KCl 20 mEq / L 1,000 mL (07/01/14 0953)   TDD:UKGURKYHC,  RESOURCE THICKENUP CLEAR, senna-docusate  Discharge medications to be determined when he is released from inpatient rehabilitation.   Follow-up Information    Follow up with Simona Huh, MD In 3 weeks.   Specialty:  Family Medicine   Contact information:   [address], [place] [address] [phone]       Follow up with SETHI,PRAMOD, MD. Schedule an appointment as soon as possible for a visit in 1 month.   Specialties:  Neurology, Radiology   Contact information:   [address] [place] [address] [place]: 35 minutes.  [facility]  [facility] Pager [phone]  07/02/2014, 11:33 AM

## 2014-07-02 NOTE — Plan of Care (Signed)
Problem: Progression Outcomes Goal: Bowel & Bladder Continence Outcome: Not Progressing

## 2014-07-02 NOTE — Interval H&P Note (Signed)
[name] was admitted today to Inpatient Rehabilitation with the diagnosis of left pontine infarct.  The patient's history has been reviewed, patient examined, and there is no change in status.  Patient continues to be appropriate for intensive inpatient rehabilitation.  I have reviewed the patient's chart and labs.  Questions were answered to the patient's satisfaction.  Gale Klar T 07/02/2014, 8:12 PM

## 2014-07-02 NOTE — H&P (Signed)
Physical Medicine and Rehabilitation Admission H&P    Chief Complaint  Patient presents with  . Right sided weakness, right gaze preference, severe dysarthria as well as cognitive deficits    HPI:  [name] is a 80 y.o. left handed male with history of HTN, prostate cancer, recent PNA with malaise X 1 week who was admitted on 06/27/14 with right sided weakness with right gaze preference and difficulty talking. Patient was noted to be in A fib in setting of CAP and CT head without acute changes. EKG evaluated by cardiology and felt to have multiple artifacts with 1 degree AVB and repeat EKG with NSR.  NIHSS- 14. 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 2D echo with EF 55-60% with mild focal basal atrophy and grade 1 diastolic dysfunction. Carotid dopplers without significant stenosis.   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   Speech/language evaluation revals moderate to severe dysarthria with perseveration, echolalia, impaired cognition as well as difficulty following one step commands.  MBS with delayed swallow with silent penetration of thin and nectar with straws--he was started on dysphagia 1 diet with nectar liquids but continues to have coughing episodes due to difficulty handling secretions. He continues on Augmentin for CAP v/s aspiration. Dehydration resolved past aggressive hydration. Patient with subsequent right hemiparesis with sensory deficits, left inattention, dysphagia, severe dysarthria as well as cognitive linguistic deficits.    Review of Systems  Respiratory: Positive for cough and shortness of breath.   Cardiovascular: Negative for chest pain.  Gastrointestinal: Negative for nausea.  Neurological:  Positive for speech change and focal weakness.      Past Medical History  Diagnosis Date  . Hypertension   . Cancer     Past Surgical History  Procedure Laterality Date  . Appendectomy    . Kidney surgery      Family History  Problem Relation Age of Onset  . CAD Father     Social History:  Married. Wife elderly and needs an AD to get around. Per reports that he has quit smoking in 1959. He does not have any smokeless tobacco history on file. He reports that he drinks alcohol daily. He reports that he does not use illicit drugs.   Allergies: No Known Allergies    Medications Prior to Admission  Medication Sig Dispense Refill  . hydrochlorothiazide (HYDRODIURIL) 25 MG tablet Take 25 mg by mouth daily.      Home: Home Living Family/patient expects to be discharged to:: Inpatient rehab  Lives With: Spouse   Functional History: Prior Function Level of Independence: Independent  Functional Status:  Mobility: Bed Mobility Overal bed mobility: Needs Assistance Bed Mobility: Rolling, Sidelying to Sit Rolling: Max assist, +2 for physical assistance Sidelying to sit: Max assist, +2 for physical assistance, HOB elevated General bed mobility comments: HOB elevated for aspiration precautions--pt with incr sputum in mouth and had just finished breakfast. Max +2 (A) to complete full roll and to protect RUE and RLE. (A) to descend bil LE off bed and to elevate trunk. (A) to support trunk in sitting position--pt with R lateral lean. Verbal cues to look up--pt with chin down. Verbal cues to reach for bedrails and sequencing. Transfers Overall transfer level: Needs assistance Equipment used:  Charlaine Dalton) Transfers: Sit to/from  Stand, Stand Pivot Transfers Sit to Stand: Total assist, +2 physical assistance, +2 safety/equipment (Stedy) Stand pivot transfers: Total assist, +2 physical assistance, +2 safety/equipment (Stedy) General transfer comment: Total +2 (A) with bariatric stedy and  chuck pad underneath to give extra assist for pushing hips into neutral position. (A) to support RUE and to support for R lateral lean. Verbal cues for hand placement on Stedy and to keep head up. Ambulation/Gait General Gait Details: unable to walk    ADL: ADL Overall ADL's : Needs assistance/impaired Grooming: Wash/dry face, Oral care, Minimal assistance, Sitting Grooming Details (indicate cue type and reason): Pt able to brush teeth with setup provided but required min (A) to fully clear incr sputum from mouth which was causing coughing/choking. Pt able to dry mouth with setup and verbal cues. Upper Body Dressing : Moderate assistance, Bed level Upper Body Dressing Details (indicate cue type and reason): Mod (A) to thread RUE. Toileting- Clothing Manipulation and Hygiene: Maximal assistance, +2 for physical assistance, Bed level Toileting - Clothing Manipulation Details (indicate cue type and reason): Pt soiled on arrival. Max +2 (A) to complete peri-care at bed level. Functional mobility during ADLs: Total assistance, +2 for physical assistance, +2 for safety/equipment Charlaine Dalton) General ADL Comments: Pt educated and practiced using L hand and leg to move R side. Pt initiated UE weight shift (side-side) while seated EOB. Pt with active movement in R leg--pt pulled R leg back after feeling Stedy on his toes. Pt with good awareness of R extremeties--during stand-pivot transfer pt verbalized that his R hand (IV line) was stuck on Stedy.     Cognition: Cognition Overall Cognitive Status: Within Functional Limits for tasks assessed Arousal/Alertness: Lethargic Orientation Level: Oriented X4 Attention: Sustained Sustained Attention: Impaired Sustained Attention Impairment: Verbal basic Memory: Impaired Memory Impairment: Decreased recall of new information Problem Solving: Impaired Problem Solving Impairment: Functional basic Safety/Judgment: Impaired Cognition Arousal/Alertness:  Awake/alert Behavior During Therapy: WFL for tasks assessed/performed Overall Cognitive Status: Within Functional Limits for tasks assessed Area of Impairment: Following commands, Awareness, Problem solving, Memory Memory: Decreased short-term memory Following Commands: Follows one step commands inconsistently Awareness: Intellectual, Anticipatory Problem Solving: Slow processing, Decreased initiation, Difficulty sequencing, Requires verbal cues, Requires tactile cues General Comments: 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 rate 20, height 5' 9"  (1.753 m), weight 68.04 kg (150 lb), SpO2 98 %. Physical Exam  Nursing note and vitals reviewed. Constitutional: He is oriented to person, place, and time. He appears well-developed and well-nourished. No distress.  Drooling out of the right side of his mouth  HENT:  Head: Normocephalic and atraumatic.  Tongue with thick white coating and dry crusted areas on lips. 2.5 cm scabbed area on scalp (skin cancer excised)  Eyes: Conjunctivae are normal. Pupils are equal, round, and reactive to light. Right eye exhibits no discharge. Left eye exhibits no discharge. No scleral icterus.  Neck: Normal range of motion. Neck supple. No JVD present. No tracheal deviation present. No thyromegaly present.  Cardiovascular: Normal rate and regular rhythm.  Exam reveals no friction rub.   No murmur heard. Respiratory: Effort normal. No accessory muscle usage. No respiratory distress. He has wheezes. He has rales in the left upper field. He exhibits no tenderness.  GI: Soft. Bowel sounds are normal. He exhibits no distension. There is no tenderness. There is no rebound.  Musculoskeletal: He exhibits no edema or  tenderness.  Neurological: He is alert and oriented to person, place, and time.  Moderate to severe dysarthria with wet  voice but able to clear secretions. Right facial weakness and central 7. right gaze preference. Poor posture with tendency to slump to the right but able to self correct with cues. More alert today with improved ability to follow one step commands.  Senses pain in both arm and leg. Light touch appears grossly intact. Toes up. DTR's hyperactive RUE and RLE.. MMT: 0/5 RUE prox to distal. LLE: trace to 0/5 prox to 0/5 distal.  Skin: Skin is warm and dry.  Psychiatric:  Flat, slow to engage    Results for orders placed or performed during the hospital encounter of 06/27/14 (from the past 48 hour(s))  CBC with Differential     Status: Abnormal   Collection Time: 07/01/14  6:00 AM  Result Value Ref Range   WBC 17.1 (H) 4.0 - 10.5 K/uL    Comment: WHITE COUNT CONFIRMED ON SMEAR   RBC 4.00 (L) 4.22 - 5.81 MIL/uL   Hemoglobin 11.3 (L) 13.0 - 17.0 g/dL   HCT 35.1 (L) 39.0 - 52.0 %   MCV 87.8 78.0 - 100.0 fL   MCH 28.3 26.0 - 34.0 pg   MCHC 32.2 30.0 - 36.0 g/dL   RDW 12.8 11.5 - 15.5 %   Platelets 353 150 - 400 K/uL    Comment: PLATELET COUNT CONFIRMED BY SMEAR   Neutrophils Relative % 87 (H) 43 - 77 %   Lymphocytes Relative 6 (L) 12 - 46 %   Monocytes Relative 7 3 - 12 %   Eosinophils Relative 0 0 - 5 %   Basophils Relative 0 0 - 1 %   Neutro Abs 14.9 (H) 1.7 - 7.7 K/uL   Lymphs Abs 1.0 0.7 - 4.0 K/uL   Monocytes Absolute 1.2 (H) 0.1 - 1.0 K/uL   Eosinophils Absolute 0.0 0.0 - 0.7 K/uL   Basophils Absolute 0.0 0.0 - 0.1 K/uL   WBC Morphology INCREASED BANDS (>20% BANDS)    Smear Review LARGE PLATELETS PRESENT   Basic metabolic panel     Status: Abnormal   Collection Time: 07/01/14  6:00 AM  Result Value Ref Range   Sodium 142 137 - 147 mEq/L   Potassium 4.4 3.7 - 5.3 mEq/L   Chloride 108 96 - 112 mEq/L   CO2 22 19 - 32 mEq/L   Glucose, Bld 121 (H) 70 - 99 mg/dL   BUN 24 (H) 6 - 23 mg/dL   Creatinine, Ser 0.72 0.50 - 1.35 mg/dL   Calcium 8.6 8.4 - 10.5 mg/dL   GFR calc non Af Amer  82 (L) >90 mL/min   GFR calc Af Amer >90 >90 mL/min    Comment: (NOTE) The eGFR has been calculated using the CKD EPI equation. This calculation has not been validated in all clinical situations. eGFR's persistently <90 mL/min signify possible Chronic Kidney Disease.    Anion gap 12 5 - 15   No results found.     Medical Problem List and Plan: 1. Functional deficits secondary to left paramedian pontine infarct 2.  DVT Prophylaxis/Anticoagulation: Pharmaceutical: Lovenox 3.  Pain Management: N/A 4.  Mood: LCSW to follow for evaluation and support.  5.  Neuropsych: This patient is not capable of making decisions on his own behalf. 6.  Skin/Wound Care: Routine pressure relief measures. Turn patient every 2 hours when in bed.  7.  Fluids/Electrolytes/Nutrition: Monitor I/O. Check daily weights  to monitor for signs of overload.  8.  CAP: Will check follow CXR to monitor for stability/ fluid overload as continues to have difficulty handling secretions as well as cough. Antibiotic Day # 4 9.   Acute renal insufficiency: Improved. Off HCTZ at this time. 10.   HTN: Will monitor every 8 hours and allow permissive HTN for adequate perfusion. 11. Reactive leucocytosis: Will recheck in am. Follow temp curve for now.  12. Thrush: Will treat with short course of diflucan.      Post Admission Physician Evaluation: 1. Functional deficits secondary  to left paramedian pontine infarct with dense right hemiparesis. 2. Patient is admitted to receive collaborative, interdisciplinary care between the physiatrist, rehab nursing staff, and therapy team. 3. Patient's level of medical complexity and substantial therapy needs in context of that medical necessity cannot be provided at a lesser intensity of care such as a SNF. 4. Patient has experienced substantial functional loss from his/her baseline which was documented above under the "Functional History" and "Functional Status" headings.  Judging by the  patient's diagnosis, physical exam, and functional history, the patient has potential for functional progress which will result in measurable gains while on inpatient rehab.  These gains will be of substantial and practical use upon discharge  in facilitating mobility and self-care at the household level. 5. Physiatrist will provide 24 hour management of medical needs as well as oversight of the therapy plan/treatment and provide guidance as appropriate regarding the interaction of the two. 6. 24 hour rehab nursing will assist with bladder management, bowel management, safety, skin/wound care, disease management, medication administration, pain management and patient education  and help integrate therapy concepts, techniques,education, etc. 7. PT will assess and treat for/with: Lower extremity strength, range of motion, stamina, balance, functional mobility, safety, adaptive techniques and equipment, NMR, visual-spatial awareness, family education, leisure awareness.   Goals are: min assist to mod assist. 8. OT will assess and treat for/with: ADL's, functional mobility, safety, upper extremity strength, adaptive techniques and equipment, NMR, visual-perceptual awareness, caregiver education, ego support.   Goals are: min to mod assist. Therapy may proceed with showering this patient. 9. SLP will assess and treat for/with: communication, cognition, swallowing, speech intelligibility.  Goals are: min assist +. 10. Case Management and Social Worker will assess and treat for psychological issues and discharge planning. 11. Team conference will be held weekly to assess progress toward goals and to determine barriers to discharge. 12. Patient will receive at least 3 hours of therapy per day at least 5 days per week. 13. ELOS: 22-28 days       14. Prognosis:  excellent     Meredith Staggers, MD, [facility] 07/02/2014   07/02/2014

## 2014-07-02 NOTE — Progress Notes (Signed)
Speech Language Pathology Treatment: Dysphagia;Cognitive-Linquistic  Patient Details Name: [name] MRN: [id number] DOB: 19-Nov-1925 Today's Date: 07/02/2014 Time: 7673-4193 SLP Time Calculation (min) (ACUTE ONLY): 39 min  Assessment / Plan / Recommendation Clinical Impression  Pt with mild improvements in intelligibility to speech and timely oral transit for swallow initiation today. SLP provided set-up and assistance for oral care via suctioning. OT informed SLP of growth on hard palate; this area appeared to clear with oral care, as it seemed to be made of dried secretions. RN also informed of coating on tongue.   Pt continues to have right-sided anterior loss with reduced awareness requiring cues from therapist to self-manage. SLP provided Mod faded to Min cues for small, single sips which was effective at eliminating immediate cough post swallow. Note that patient is scheduled to discharge to CIR later today - recommend trials of advanced Dys 2 textures at this next level of care.  Today, patient knows that it is December and sustains attention to basic, self-care tasks with Min cues. He is beginning to use speech intelligibility strategies with Mod cues from therapist to pause between words and intermittently provide the first letter of words that are less intelligible. Pt often spells the whole word, and although spelling errors are noted, it effectively facilitates functional communication.    HPI HPI: [name] is a 80 y.o. male with history of hypertension and prostate cancer. He had a productive cough for few days prior to admission. Patient had gone to his PCP on day of admission and was prescribed antibiotics for possible pneumonia. Later patient's family called EMS as they found that patient was having right facial droop with right-sided hemiplegia and was brought to the ER. MRI shows an acute nonhemorrhagic left paramedian pontine infarct.   Pertinent Vitals Pain Assessment:  No/denies pain  SLP Plan  Continue with current plan of care    Recommendations Diet recommendations: Dysphagia 1 (puree);Nectar-thick liquid Liquids provided via: Cup;No straw Medication Administration: Crushed with puree Supervision: Patient able to self feed;Full supervision/cueing for compensatory strategies Compensations: Slow rate;Small sips/bites;Multiple dry swallows after each bite/sip;Check for pocketing;Check for anterior loss Postural Changes and/or Swallow Maneuvers: Seated upright 90 degrees;Upright 30-60 min after meal       Oral Care Recommendations: Oral care BID;Oral care before and after PO Follow up Recommendations: Inpatient Rehab;24 hour supervision/assistance Plan: Continue with current plan of care    GO      Germain Osgood, M.A. CCC-SLP [phone]  Germain Osgood 07/02/2014, 12:55 PM

## 2014-07-02 NOTE — Discharge Instructions (Signed)
Ischemic Stroke °A stroke (cerebrovascular accident) is the sudden death of brain tissue. It is a medical emergency. A stroke can cause permanent loss of brain function. This can cause problems with different parts of your body. A transient ischemic attack (TIA) is different because it does not cause permanent damage. A TIA is a short-lived problem of poor blood flow affecting a part of the brain. A TIA is also a serious problem because having a TIA greatly increases the chances of having a stroke. When symptoms first develop, you cannot know if the problem might be a stroke or a TIA. °CAUSES  °A stroke is caused by a decrease of oxygen supply to an area of your brain. It is usually the result of a small blood clot or collection of cholesterol or fat (plaque) that blocks blood flow in the brain. A stroke can also be caused by blocked or damaged carotid arteries.  °RISK FACTORS °· High blood pressure (hypertension). °· High cholesterol. °· Diabetes mellitus. °· Heart disease. °· The buildup of plaque in the blood vessels (peripheral artery disease or atherosclerosis). °· The buildup of plaque in the blood vessels providing blood and oxygen to the brain (carotid artery stenosis). °· An abnormal heart rhythm (atrial fibrillation). °· Obesity. °· Smoking. °· Taking oral contraceptives (especially in combination with smoking). °· Physical inactivity. °· A diet high in fats, salt (sodium), and calories. °· Alcohol use. °· Use of illegal drugs (especially cocaine and methamphetamine). °· Being African American. °· Being over the age of 55. °· Family history of stroke. °· Previous history of blood clots, stroke, TIA, or heart attack. °· Sickle cell disease. °SYMPTOMS  °These symptoms usually develop suddenly, or may be newly present upon awakening from sleep: °· Sudden weakness or numbness of the face, arm, or leg, especially on one side of the body. °· Sudden trouble walking or difficulty moving arms or legs. °· Sudden  confusion. °· Sudden personality changes. °· Trouble speaking (aphasia) or understanding. °· Difficulty swallowing. °· Sudden trouble seeing in one or both eyes. °· Double vision. °· Dizziness. °· Loss of balance or coordination. °· Sudden severe headache with no known cause. °· Trouble reading or writing. °DIAGNOSIS  °Your health care provider can often determine the presence or absence of a stroke based on your symptoms, history, and physical exam. Computed tomography (CT) of the brain is usually performed to confirm the stroke, determine causes, and determine stroke severity. Other tests may be done to find the cause of the stroke. These tests may include: °· Electrocardiography. °· Continuous heart monitoring. °· Echocardiography. °· Carotid ultrasonography. °· Magnetic resonance imaging (MRI). °· A scan of the brain circulation. °· Blood tests. °PREVENTION  °The risk of a stroke can be decreased by appropriately treating high blood pressure, high cholesterol, diabetes, heart disease, and obesity and by quitting smoking, limiting alcohol, and staying physically active. °TREATMENT  °Time is of the essence. It is important to seek treatment at the first sign of these symptoms because you may receive a medicine to dissolve the clot (thrombolytic) that cannot be given if too much time has passed since your symptoms began. Even if you do not know when your symptoms began, get treatment as soon as possible as there are other treatment options available including oxygen, intravenous (IV) fluids, and medicines to thin the blood (anticoagulants). Treatment of stroke depends on the duration, severity, and cause of your symptoms. Medicines and dietary changes may be used to address diabetes, high blood   pressure, and other risk factors. Physical, speech, and occupational therapists will assess you and work with you to improve any functions impaired by the stroke. Measures will be taken to prevent short-term and long-term  complications, including infection from breathing foreign material into the lungs (aspiration pneumonia), blood clots in the legs, bedsores, and falls. Rarely, surgery may be needed to remove large blood clots or to open up blocked arteries. °HOME CARE INSTRUCTIONS  °· Take medicines only as directed by your health care provider. Follow the directions carefully. Medicines may be used to control risk factors for a stroke. Be sure you understand all your medicine instructions. °· You may be told to take a medicine to thin the blood, such as aspirin or the anticoagulant warfarin. Warfarin needs to be taken exactly as instructed. °¨ Too much and too little warfarin are both dangerous. Too much warfarin increases the risk of bleeding. Too little warfarin continues to allow the risk for blood clots. While taking warfarin, you will need to have regular blood tests to measure your blood clotting time. These blood tests usually include both the PT and INR tests. The PT and INR results allow your health care provider to adjust your dose of warfarin. The dose can change for many reasons. It is critically important that you take warfarin exactly as prescribed, and that you have your PT and INR levels drawn exactly as directed. °¨ Many foods, especially foods high in vitamin K, can interfere with warfarin and affect the PT and INR results. Foods high in vitamin K include spinach, kale, broccoli, cabbage, collard and turnip greens, brussels sprouts, peas, cauliflower, seaweed, and parsley, as well as beef and pork liver, green tea, and soybean oil. You should eat a consistent amount of foods high in vitamin K. Avoid major changes in your diet, or notify your health care provider before changing your diet. Arrange a visit with a dietitian to answer your questions. °¨ Many medicines can interfere with warfarin and affect the PT and INR results. You must tell your health care provider about any and all medicines you take. This  includes all vitamins and supplements. Be especially cautious with aspirin and anti-inflammatory medicines. Do not take or discontinue any prescribed or over-the-counter medicine except on the advice of your health care provider or pharmacist. °¨ Warfarin can have side effects, such as excessive bruising or bleeding. You will need to hold pressure over cuts for longer than usual. Your health care provider or pharmacist will discuss other potential side effects. °¨ Avoid sports or activities that may cause injury or bleeding. °¨ Be mindful when shaving, flossing your teeth, or handling sharp objects. °¨ Alcohol can change the body's ability to handle warfarin. It is best to avoid alcoholic drinks or consume only very small amounts while taking warfarin. Notify your health care provider if you change your alcohol intake. °¨ Notify your dentist or other health care providers before procedures. °· If swallow studies have determined that your swallowing reflex is present, you should eat healthy foods. Including 5 or more servings of fruits and vegetables a day may reduce the risk of stroke. Foods may need to be a certain consistency (soft or pureed), or small bites may need to be taken in order to avoid aspirating or choking. Certain dietary changes may be advised to address high blood pressure, high cholesterol, diabetes, or obesity. °¨ Food choices that are low in sodium, saturated fat, trans fat, and cholesterol are recommended to manage high blood pressure. °¨   Food choies that are high in fiber, and low in saturated fat, trans fat, and cholesterol may control cholesterol levels. °¨ Controlling carbohydrates and sugar intake is recommended to manage diabetes. °¨ Reducing calorie intake and making food choices that are low in sodium, saturated fat, trans fat, and cholesterol are recommended to manage obesity. °· Maintain a healthy weight. °· Stay physically active. It is recommended that you get at least 30 minutes of  activity on all or most days. °· Do not use any tobacco products including cigarettes, chewing tobacco, or electronic cigarettes. °· Limit alcohol use even if you are not taking warfarin. Moderate alcohol use is considered to be: °¨ No more than 2 drinks each day for men. °¨ No more than 1 drink each day for nonpregnant women. °· Home safety. A safe home environment is important to reduce the risk of falls. Your health care provider may arrange for specialists to evaluate your home. Having grab bars in the bedroom and bathroom is often important. Your health care provider may arrange for equipment to be used at home, such as raised toilets and a seat for the shower. °· Physical, occupational, and speech therapy. Ongoing therapy may be needed to maximize your recovery after a stroke. If you have been advised to use a walker or a cane, use it at all times. Be sure to keep your therapy appointments. °· Follow all instructions for follow-up with your health care provider. This is very important. This includes any referrals, physical therapy, rehabilitation, and lab tests. Proper follow-up can prevent another stroke from occurring. °SEEK MEDICAL CARE IF: °· You have personality changes. °· You have difficulty swallowing. °· You are seeing double. °· You have dizziness. °· You have a fever. °· You have skin breakdown. °SEEK IMMEDIATE MEDICAL CARE IF:  °Any of these symptoms may represent a serious problem that is an emergency. Do not wait to see if the symptoms will go away. Get medical help right away. Call your local emergency services (911 in [place] Do not drive yourself to the hospital. °· You have sudden weakness or numbness of the face, arm, or leg, especially on one side of the body. °· You have sudden trouble walking or difficulty moving arms or legs. °· You have sudden confusion. °· You have trouble speaking (aphasia) or understanding. °· You have sudden trouble seeing in one or both eyes. °· You have a loss of  balance or coordination. °· You have a sudden, severe headache with no known cause. °· You have new chest pain or an irregular heartbeat. °· You have a partial or total loss of consciousness. °Document Released: 07/13/2005 Document Revised: 11/27/2013 Document Reviewed: 02/21/2012 °[employer]® Patient Information ©2015 [employer]. This information is not intended to replace advice given to you by your health care provider. Make sure you discuss any questions you have with your health care provider. ° °

## 2014-07-02 NOTE — Plan of Care (Signed)
Problem: Acute Treatment Outcomes Goal: BP within ordered parameters Outcome: Progressing     

## 2014-07-03 ENCOUNTER — Inpatient Hospital Stay (HOSPITAL_COMMUNITY): Payer: Medicare Other

## 2014-07-03 ENCOUNTER — Inpatient Hospital Stay (HOSPITAL_COMMUNITY): Payer: Medicare Other | Admitting: Speech Pathology

## 2014-07-03 DIAGNOSIS — I6350 Cerebral infarction due to unspecified occlusion or stenosis of unspecified cerebral artery: Secondary | ICD-10-CM

## 2014-07-03 DIAGNOSIS — R197 Diarrhea, unspecified: Secondary | ICD-10-CM

## 2014-07-03 DIAGNOSIS — I10 Essential (primary) hypertension: Secondary | ICD-10-CM

## 2014-07-03 DIAGNOSIS — J189 Pneumonia, unspecified organism: Secondary | ICD-10-CM

## 2014-07-03 LAB — CBC WITH DIFFERENTIAL/PLATELET
BASOS ABS: 0.1 10*3/uL (ref 0.0–0.1)
Basophils Relative: 0 % (ref 0–1)
EOS ABS: 0.2 10*3/uL (ref 0.0–0.7)
Eosinophils Relative: 1 % (ref 0–5)
HCT: 37.3 % — ABNORMAL LOW (ref 39.0–52.0)
Hemoglobin: 11.9 g/dL — ABNORMAL LOW (ref 13.0–17.0)
LYMPHS PCT: 8 % — AB (ref 12–46)
Lymphs Abs: 1.3 10*3/uL (ref 0.7–4.0)
MCH: 28.3 pg (ref 26.0–34.0)
MCHC: 31.9 g/dL (ref 30.0–36.0)
MCV: 88.8 fL (ref 78.0–100.0)
Monocytes Absolute: 1.1 10*3/uL — ABNORMAL HIGH (ref 0.1–1.0)
Monocytes Relative: 6 % (ref 3–12)
NEUTROS PCT: 85 % — AB (ref 43–77)
Neutro Abs: 14.3 10*3/uL — ABNORMAL HIGH (ref 1.7–7.7)
PLATELETS: 341 10*3/uL (ref 150–400)
RBC: 4.2 MIL/uL — AB (ref 4.22–5.81)
RDW: 13.1 % (ref 11.5–15.5)
WBC: 17 10*3/uL — AB (ref 4.0–10.5)

## 2014-07-03 LAB — COMPREHENSIVE METABOLIC PANEL
ALK PHOS: 108 U/L (ref 39–117)
ALT: 23 U/L (ref 0–53)
AST: 27 U/L (ref 0–37)
Albumin: 1.9 g/dL — ABNORMAL LOW (ref 3.5–5.2)
Anion gap: 11 (ref 5–15)
BUN: 25 mg/dL — ABNORMAL HIGH (ref 6–23)
CO2: 22 meq/L (ref 19–32)
Calcium: 9 mg/dL (ref 8.4–10.5)
Chloride: 109 mEq/L (ref 96–112)
Creatinine, Ser: 0.67 mg/dL (ref 0.50–1.35)
GFR calc non Af Amer: 85 mL/min — ABNORMAL LOW (ref >90)
GLUCOSE: 124 mg/dL — AB (ref 70–99)
POTASSIUM: 4.1 meq/L (ref 3.7–5.3)
SODIUM: 142 meq/L (ref 137–147)
TOTAL PROTEIN: 5.7 g/dL — AB (ref 6.0–8.3)
Total Bilirubin: 0.3 mg/dL (ref 0.3–1.2)

## 2014-07-03 LAB — CLOSTRIDIUM DIFFICILE BY PCR: CDIFFPCR: NEGATIVE

## 2014-07-03 LAB — URINE CULTURE
COLONY COUNT: NO GROWTH
CULTURE: NO GROWTH

## 2014-07-03 MED ORDER — WHITE PETROLATUM GEL
Freq: Two times a day (BID) | Status: DC
  Administered 2014-07-03: 11:00:00 via TOPICAL
  Administered 2014-07-03 – 2014-07-04 (×3): 0.2 via TOPICAL
  Administered 2014-07-05: 20:00:00 via TOPICAL
  Administered 2014-07-05: 0.2 via TOPICAL
  Administered 2014-07-06: 20:00:00 via TOPICAL
  Administered 2014-07-06 – 2014-07-09 (×6): 0.2 via TOPICAL
  Administered 2014-07-09: 20:00:00 via TOPICAL
  Administered 2014-07-10: 0.2 via TOPICAL
  Administered 2014-07-10: 20:00:00 via TOPICAL
  Administered 2014-07-11 – 2014-07-12 (×4): 0.2 via TOPICAL
  Administered 2014-07-13: 08:00:00 via TOPICAL
  Administered 2014-07-13 – 2014-07-20 (×14): 0.2 via TOPICAL
  Administered 2014-07-20: 23:00:00 via TOPICAL
  Administered 2014-07-21 – 2014-07-27 (×13): 0.2 via TOPICAL
  Administered 2014-07-27: 20:00:00 via TOPICAL
  Administered 2014-07-28 (×2): 0.2 via TOPICAL
  Administered 2014-07-29: 1 via TOPICAL
  Administered 2014-07-29: 0.2 via TOPICAL
  Administered 2014-07-30: 08:00:00 via TOPICAL
  Administered 2014-07-30 – 2014-07-31 (×2): 0.2 via TOPICAL
  Administered 2014-07-31: 20:00:00 via TOPICAL
  Administered 2014-08-01: 0.2 via TOPICAL
  Filled 2014-07-03 (×43): qty 28.35
  Filled 2014-07-03: qty 5
  Filled 2014-07-03: qty 28.35

## 2014-07-03 MED ORDER — SODIUM CHLORIDE 0.9 % IV SOLN
INTRAVENOUS | Status: DC
  Administered 2014-07-03: 19:00:00 via INTRAVENOUS
  Filled 2014-07-03 (×2): qty 1000

## 2014-07-03 MED ORDER — WHITE PETROLATUM GEL
Freq: Every day | Status: DC
  Administered 2014-07-03: 0.2 via TOPICAL
  Filled 2014-07-03: qty 28.35
  Filled 2014-07-03: qty 5
  Filled 2014-07-03: qty 28.35

## 2014-07-03 MED ORDER — CALCIUM POLYCARBOPHIL 625 MG PO TABS
625.0000 mg | ORAL_TABLET | Freq: Two times a day (BID) | ORAL | Status: DC
  Administered 2014-07-03 – 2014-08-01 (×57): 625 mg via ORAL
  Filled 2014-07-03 (×67): qty 1

## 2014-07-03 NOTE — Plan of Care (Signed)
Problem: RH SAFETY Goal: RH STG ADHERE TO SAFETY PRECAUTIONS W/ASSISTANCE/DEVICE STG Adhere to Safety Precautions With Supervision Assistance/Device.  Outcome: Progressing

## 2014-07-03 NOTE — Evaluation (Signed)
Physical Therapy Assessment and Plan  Patient Details  Name: [name] MRN: [id number] Date of Birth: 1926-04-05  PT Diagnosis: Abnormal posture, Abnormality of gait, Cognitive deficits, Coordination disorder, Edema, Hemiplegia dominant, Impaired sensation and Muscle weakness; Decreased activity tolerance; Decreased balance Rehab Potential: Good ELOS: 21-28 days   Today's Date: 07/03/2014 PT Individual Time: 1300-1400 PT Individual Time Calculation (min): 60 min    Problem List:  Patient Active Problem List   Diagnosis Date Noted  . Left pontine stroke 07/02/2014  . Embolic stroke involving left carotid artery 06/28/2014  . Stroke 06/28/2014  . CAP (community acquired pneumonia) 06/28/2014  . Normocytic anemia 06/28/2014  . Essential hypertension 06/28/2014  . Hypokalemia 06/28/2014    Past Medical History:  Past Medical History  Diagnosis Date  . Hypertension   . Cancer    Past Surgical History:  Past Surgical History  Procedure Laterality Date  . Appendectomy    . Kidney surgery      Assessment & Plan Clinical Impression: [name] is a 80 y.o. left handed male with history of HTN, prostate cancer, recent PNA who was admitted on 06/27/14 with right sided weakness with right gaze preference and difficulty talking. Patient was noted to be in A fib in setting of CAP and CT head without acute changes. NIHSS- 14. 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 2D echo with EF 55-60% with mild focal basal atrophy and grade 1 diastolic dysfunction. Carotid dopplers without significant stenosis. 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 Also started on a statin. PNA likely aspiration. He was initially started on  Levaquin. His white count was high and then started improving and is now fluctuating. He is afebrile. Saturating well on oxygen. He'll be prescribed some Robitussin to help him expectorate. He was changed over to oral Augmentin. This can be continued for a total course of 10 days. Recommend repeating chest x-ray in 4-6 weeks..  Patient transferred to CIR on 07/02/2014 .   Patient currently requires Ax2persons with mobility secondary to muscle weakness, decreased cardiorespiratoy endurance, decreased coordination, decreased midline orientation, decreased attention to left and decreased attention to right, decreased awareness, decreased problem solving and decreased memory and decreased sitting balance, decreased standing balance, decreased postural control, hemiplegia and decreased balance strategies.  Prior to hospitalization, patient was independent  with mobility and lived with Spouse in a House home.  Home access is 4Stairs to enter.  Patient will benefit from skilled PT intervention to maximize safe functional mobility, minimize fall risk and decrease caregiver burden for planned discharge home with 24 hour assist.  Anticipate patient will benefit from follow up [facility] at discharge.  PT - End of Session Activity Tolerance: Tolerates 10 - 20 min activity with multiple rests Endurance Deficit: Yes Endurance Deficit Description: requires frequent rest breaks PT Assessment Rehab Potential (ACUTE/IP ONLY): Good Barriers to Discharge: Decreased caregiver support Barriers to Discharge Comments: Wife unable to physically assist patient PT Patient demonstrates impairments in the following area(s): Balance;Endurance;Motor;Perception;Safety;Sensory;Skin Integrity;Other (comment);Edema (strength) PT Transfers Functional Problem(s): Bed to Chair;Car;Furniture;Bed Mobility PT Locomotion Functional Problem(s): Ambulation;Wheelchair Mobility;Stairs PT Plan PT Intensity: Minimum of 1-2 x/day ,45 to 90 minutes PT  Frequency: 5 out of 7 days PT Duration Estimated Length of Stay: 21-28 days PT Treatment/Interventions: Ambulation/gait training;Community reintegration;DME/adaptive  equipment instruction;Neuromuscular re-education;Psychosocial support;Stair training;UE/LE Strength taining/ROM;Wheelchair propulsion/positioning;UE/LE Coordination activities;Therapeutic Activities;Skin care/wound management;Pain management;Discharge planning;Balance/vestibular training;Functional electrical stimulation;Cognitive remediation/compensation;Disease management/prevention;Functional mobility training;Patient/family education;Splinting/orthotics;Therapeutic Exercise;Visual/perceptual remediation/compensation PT Transfers Anticipated Outcome(s): Min A overall PT Locomotion Anticipated Outcome(s): Supervision-min A overall PT Recommendation Follow Up Recommendations: Home health PT Patient destination: Home Equipment Recommended: To be determined  Skilled Therapeutic Intervention 1:1. Pt received sitting in w/c, ready for therapy with wife present at side. PT evaluation performed, see detailed objective information below. Pt and wife educated on rehab environment, role of therapies, goals for physical therapy and general safety plan. Both verbalized understanding. Tx initiated with emphasis on functional transfers and w/c propulsion. Pt left semi-reclined in bed with all needs in reach, bed alarm on and wife at side.   PT Evaluation Precautions/Restrictions Precautions Precautions: Fall Precaution Comments: R hemiplegia, R gaze preference Restrictions Weight Bearing Restrictions: No General Chart Reviewed: Yes Family/Caregiver Present: Yes (Wife) Vital SignsTherapy Vitals Temp: 97.8 F (36.6 C) Temp Source: Oral Pulse Rate: 61 Resp: 18 BP: (!) 151/58 mmHg Patient Position (if appropriate): Lying Oxygen Therapy SpO2: 97 % O2 Device: Not Delivered Pain Pain Assessment Pain Assessment: No/denies pain Home  Living/Prior Functioning Home Living Available Help at Discharge: Family;Other (Comment) Type of Home: House Home Access: Stairs to enter Entrance Stairs-Number of Steps: 4 Entrance Stairs-Rails: Left Home Layout: Two level;Able to live on main level with bedroom/bathroom Alternate Level Stairs-Number of Steps: flight. 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[employer]; Hydrologist for, golf Vision/Perception  See OT eval Vision - Assessment Eye Alignment: Within Passenger transport manager Range of Motion: Restricted on the right;Restricted on the left Alignment/Gaze Preference: Gaze right Tracking/Visual Pursuits: Requires cues, head turns, or add eye shifts to track;Decreased smoothness of horizontal tracking Saccades: Additional head turns occurred during testing Additional Comments: pt very fatigued; will continue to assess during function  Cognition Overall Cognitive Status: Impaired/Different from baseline Arousal/Alertness: Awake/alert Orientation Level: Oriented X4 Attention: Sustained;Selective Sustained Attention: Appears intact Selective Attention: Appears intact Memory: Impaired Memory Impairment: Decreased long term memory;Decreased recall of new information Decreased Long Term Memory: Verbal basic;Functional basic Awareness: Impaired Awareness Impairment: Emergent impairment Problem Solving: Impaired Problem Solving Impairment: Functional basic Safety/Judgment: Appears intact Sensation Sensation Light Touch: Appears Intact Proprioception: Impaired by gross assessment Coordination Gross Motor Movements are Fluid  and Coordinated: No Fine Motor Movements are Fluid and Coordinated: No Coordination and Movement Description: Impaired due to dense L hemiplegia Motor  Motor Motor: Hemiplegia;Abnormal postural alignment and control  Mobility Bed Mobility Bed Mobility: Sit to Supine Transfers Transfers: Yes Sit to Stand: 1: +2 Total assist Sit to Stand Details: Manual facilitation for weight bearing;Manual facilitation for weight shifting;Tactile cues for initiation;Tactile cues for weight shifting;Tactile cues for posture;Verbal cues for sequencing;Verbal cues for precautions/safety;Tactile cues for placement;Verbal cues for technique Stand to Sit: 1: +2 Total assist Stand to Sit Details (indicate cue type and reason): Tactile cues for initiation;Tactile cues for weight shifting;Tactile cues for posture;Verbal cues for sequencing;Manual facilitation for weight bearing;Manual facilitation for weight shifting;Verbal cues for precautions/safety;Tactile cues for placement;Verbal cues for technique Squat Pivot Transfers: 1: +2 Total assist (total Ax1 person, +2 for safety) Squat Pivot Transfer Details: Tactile cues for initiation;Tactile cues for  posture;Verbal cues for precautions/safety;Manual facilitation for weight bearing;Manual facilitation for weight shifting;Verbal cues for gait pattern;Tactile cues for placement;Verbal cues for technique Locomotion  Ambulation Ambulation: No (To Be Assessed) Gait Gait: No (To Be Assessed) Stairs / Additional Locomotion Stairs: No (To Be Assessed) Wheelchair Mobility Wheelchair Mobility: Yes Wheelchair Assistance: 2: Max Technical sales engineer Details: Verbal cues for sequencing;Tactile cues for placement;Verbal cues for technique;Verbal cues for precautions/safety Wheelchair Propulsion: Left upper extremity Wheelchair Parts Management: Needs assistance Distance: 125'  Trunk/Postural Assessment  Cervical Assessment Cervical Assessment: Exceptions to [facility]  (forward head) Thoracic Assessment Thoracic Assessment: Exceptions to WFL (kyphotic, R lean) Lumbar Assessment Lumbar Assessment: Exceptions to [facility] (posterior pelvic tilt) Postural Control Postural Control: Deficits on evaluation Trunk Control: fatigues easily, R trunk lean due to weakness, pushing, intellectual awareness of trunk control, but impaired emergent awarness Righting Reactions: absent/delayed Protective Responses: absent/delayed  Balance Balance Balance Assessed: Yes Static Sitting Balance Static Sitting - Balance Support: Feet supported;Left upper extremity supported Static Sitting - Level of Assistance: 4: Min assist;3: Mod assist Static Sitting - Comment/# of Minutes: pushes R Dynamic Sitting Balance Dynamic Sitting - Balance Support: Feet supported;Left upper extremity supported Dynamic Sitting - Level of Assistance: 2: Max assist;1: +1 Total assist Dynamic Sitting - Balance Activities: Lateral lean/weight shifting;Forward lean/weight shifting Static Standing Balance Static Standing - Balance Support: Left upper extremity supported Static Standing - Level of Assistance: 1: +2 Total assist Extremity Assessment  RUE Assessment RUE Assessment: Exceptions to [facility] RUE PROM (degrees) Overall PROM Right Upper Extremity: Within functional limits for tasks performed RUE Overall PROM Comments: full PROM; dense hemiparesis RUE Tone RUE Tone: Modified Ashworth Modified Ashworth Scale for Grading Hypertonia RUE: No increase in muscle tone LUE Assessment LUE Assessment: Within Functional Limits (grossly 4/5 overall) RLE Assessment RLE Assessment: Exceptions to [facility] RLE Strength RLE Overall Strength: Deficits Right Hip Flexion: 2-/5 Right Hip Extension: 2-/5 Right Knee Flexion: 1/5 Right Knee Extension: 1/5 Right Ankle Dorsiflexion: 0/5 Right Ankle Plantar Flexion: 0/5 LLE Assessment LLE Assessment: Within Functional Limits (Grossly 4/5)  FIM:  FIM - Bed/Chair  Transfer Bed/Chair Transfer Assistive Devices: Arm rests Bed/Chair Transfer: 2: Sit > Supine: Max A (lifting assist/Pt. 25-49%);1: Two helpers;1: Mechanical lift FIM - Locomotion: Wheelchair Distance: 125' Locomotion: Wheelchair: 2: Travels 150 ft or more: maneuvers on rugs and over door sills with maximal assistance (Pt: 25 - 49%) FIM - Locomotion: Ambulation Locomotion: Ambulation: 0: Activity did not occur FIM - Locomotion: Stairs Locomotion: Stairs: 0: Activity did not occur   Refer to Care Plan for Long Term Goals  Recommendations for other services: None  Discharge Criteria: Patient will be discharged from PT if patient refuses treatment 3 consecutive times without medical reason, if treatment goals not met, if there is a change in medical status, if patient makes no progress towards goals or if patient is discharged from hospital.  The above assessment, treatment plan, treatment alternatives and goals were discussed and mutually agreed upon: by patient and by family  Gilmore Laroche 07/03/2014, 3:20 PM

## 2014-07-03 NOTE — Plan of Care (Signed)
Problem: RH SKIN INTEGRITY Goal: RH STG MAINTAIN SKIN INTEGRITY WITH ASSISTANCE STG Maintain Skin Integrity With Max Assistance.  Outcome: Progressing

## 2014-07-03 NOTE — Progress Notes (Signed)
Patient does not like current diet and po intake is very poor. Refusing nutritional supplements. Will add IVF at nights to maintain adequate hydration.

## 2014-07-03 NOTE — Plan of Care (Signed)
Problem: RH SKIN INTEGRITY Goal: RH STG SKIN FREE OF INFECTION/BREAKDOWN No new breakdown while on rehab with mod assit of caregiver, total assist of caregiver for skin care to buttocks  Outcome: Progressing

## 2014-07-03 NOTE — Progress Notes (Signed)
Patient information reviewed and entered into eRehab system by Issacc Merlo, RN, CRRN, PPS Coordinator.  Information including medical coding and functional independence measure will be reviewed and updated through discharge.     Per nursing patient was given "Data Collection Information Summary for Patients in Inpatient Rehabilitation Facilities with attached "Privacy Act Statement-Health Care Records" upon admission.  

## 2014-07-03 NOTE — Plan of Care (Signed)
Problem: RH BLADDER ELIMINATION Goal: RH STG MANAGE BLADDER WITH ASSISTANCE STG Manage Bladder With Min Assistance  Outcome: Not Progressing Wears condom cath.@ night.

## 2014-07-03 NOTE — Progress Notes (Addendum)
Subjective/Complaints: 80 y.o. left handed male with history of HTN, prostate cancer, recent PNA with malaise X 1 week who was admitted on 06/27/14 with right sided weakness with right gaze preference and difficulty talking. Patient was noted to be in A fib in setting of CAP and CT head without acute changes. EKG evaluated by cardiology and felt to have multiple artifacts with 1 degree AVB and repeat EKG with NSR. NIHSS- 14. 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 2D echo with EF 55-60% with mild focal basal atrophy and grade 1 diastolic dysfunction. Carotid dopplers without significant stenosis. 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 Speech/language evaluation revals moderate to severe dysarthria with perseveration, echolalia, impaired cognition as well as difficulty following one step commands  Objective: Vital Signs: Blood pressure 139/53, pulse 64, temperature 97.7 F (36.5 C), temperature source Oral, resp. rate 19, weight 70.1 kg (154 lb 8.7 oz), SpO2 99 %. Dg Chest 2 View  07/02/2014   CLINICAL DATA:  Acute shortness of breath for 5 days, pneumonia  EXAM: CHEST - 2 VIEW  COMPARISON:  06/28/2012  FINDINGS: Patchy right lower lobe airspace process compatible with pneumonia. Little interval change. Normal heart size and vascularity. Left lung remains clear. No developing effusion or pneumothorax. Apical scarring noted. Atherosclerotic calcifications of the aorta and subclavian vessels.  IMPRESSION: No significant change in right lower lobe airspace process compatible with pneumonia.   Electronically Signed   By: Daryll Brod M.D.   On: 07/02/2014 21:19   Results for orders placed or performed during the hospital encounter of  07/02/14 (from the past 72 hour(s))  Urinalysis, Routine w reflex microscopic     Status: None   Collection Time: 07/02/14  7:02 PM  Result Value Ref Range   Color, Urine YELLOW YELLOW   APPearance CLEAR CLEAR   Specific Gravity, Urine 1.024 1.005 - 1.030   pH 5.5 5.0 - 8.0   Glucose, UA NEGATIVE NEGATIVE mg/dL   Hgb urine dipstick NEGATIVE NEGATIVE   Bilirubin Urine NEGATIVE NEGATIVE   Ketones, ur NEGATIVE NEGATIVE mg/dL   Protein, ur NEGATIVE NEGATIVE mg/dL   Urobilinogen, UA 1.0 0.0 - 1.0 mg/dL   Nitrite NEGATIVE NEGATIVE   Leukocytes, UA NEGATIVE NEGATIVE    Comment: MICROSCOPIC NOT DONE ON URINES WITH NEGATIVE PROTEIN, BLOOD, LEUKOCYTES, NITRITE, OR GLUCOSE <1000 mg/dL.  CBC WITH DIFFERENTIAL     Status: Abnormal   Collection Time: 07/03/14  3:49 AM  Result Value Ref Range   WBC 17.0 (H) 4.0 - 10.5 K/uL   RBC 4.20 (L) 4.22 - 5.81 MIL/uL   Hemoglobin 11.9 (L) 13.0 - 17.0 g/dL   HCT 37.3 (L) 39.0 - 52.0 %   MCV 88.8 78.0 - 100.0 fL   MCH 28.3 26.0 - 34.0 pg   MCHC 31.9 30.0 - 36.0 g/dL   RDW 13.1 11.5 - 15.5 %   Platelets 341 150 - 400 K/uL   Neutrophils Relative % 85 (H) 43 - 77 %   Neutro Abs 14.3 (H) 1.7 - 7.7 K/uL   Lymphocytes Relative 8 (L) 12 - 46 %   Lymphs Abs 1.3 0.7 - 4.0 K/uL   Monocytes Relative 6 3 - 12 %   Monocytes Absolute 1.1 (H) 0.1 - 1.0 K/uL   Eosinophils Relative  1 0 - 5 %   Eosinophils Absolute 0.2 0.0 - 0.7 K/uL   Basophils Relative 0 0 - 1 %   Basophils Absolute 0.1 0.0 - 0.1 K/uL  Comprehensive metabolic panel     Status: Abnormal   Collection Time: 07/03/14  3:49 AM  Result Value Ref Range   Sodium 142 137 - 147 mEq/L   Potassium 4.1 3.7 - 5.3 mEq/L   Chloride 109 96 - 112 mEq/L   CO2 22 19 - 32 mEq/L   Glucose, Bld 124 (H) 70 - 99 mg/dL   BUN 25 (H) 6 - 23 mg/dL   Creatinine, Ser 0.67 0.50 - 1.35 mg/dL   Calcium 9.0 8.4 - 10.5 mg/dL   Total Protein 5.7 (L) 6.0 - 8.3 g/dL   Albumin 1.9 (L) 3.5 - 5.2 g/dL   AST 27 0 - 37 U/L   ALT 23 0  - 53 U/L   Alkaline Phosphatase 108 39 - 117 U/L   Total Bilirubin 0.3 0.3 - 1.2 mg/dL   GFR calc non Af Amer 85 (L) >90 mL/min   GFR calc Af Amer >90 >90 mL/min    Comment: (NOTE) The eGFR has been calculated using the CKD EPI equation. This calculation has not been validated in all clinical situations. eGFR's persistently <90 mL/min signify possible Chronic Kidney Disease.    Anion gap 11 5 - 15     HEENT: normal Cardio: RRR Resp: CTA B/L GI: BS positive and NT,ND Extremity:  Edema Right hand and Right foot Skin:   Wound vertex of scalp, eschar Neuro: Lethargic, Cranial Nerve Abnormalities Right central 7, Abnormal Sensory reduced on left side to LT, Abnormal Motor 0/5 on right side and Tone:  Hypotonia Musc/Skel:  Other no pain with Right shoulder ROM Gen NAD   Assessment/Plan: 1. Functional deficits secondary to left paramedian pontine infarct which require 3+ hours per day of interdisciplinary therapy in a comprehensive inpatient rehab setting. Physiatrist is providing close team supervision and 24 hour management of active medical problems listed below. Physiatrist and rehab team continue to assess barriers to discharge/monitor patient progress toward functional and medical goals. FIM:                   Comprehension Comprehension Mode: Auditory Comprehension: 3-Understands basic 50 - 74% of the time/requires cueing 25 - 50%  of the time  Expression Expression Mode: Verbal Expression: 3-Expresses basic 50 - 74% of the time/requires cueing 25 - 50% of the time. Needs to repeat parts of sentences.     Problem Solving Problem Solving: 2-Solves basic 25 - 49% of the time - needs direction more than half the time to initiate, plan or complete simple activities  Memory Memory: 3-Recognizes or recalls 50 - 74% of the time/requires cueing 25 - 49% of the time   Medical Problem List and Plan: 1. Functional deficits secondary to left paramedian pontine  infarct 2. DVT Prophylaxis/Anticoagulation: Pharmaceutical: Lovenox 3. Pain Management: N/A 4. Mood: LCSW to follow for evaluation and support.  5. Neuropsych: This patient is not capable of making decisions on his own behalf. 6. Skin/Wound Care:hx melanoma removal scalp, resume vaseline, family to bring in name of cream from dermatologist Routine pressure relief measures. Turn patient every 2 hours when in bed.  7. Fluids/Electrolytes/Nutrition: Monitor I/O. Check daily weights to monitor for signs of overload.  8. CAP: Will check follow CXR to monitor for stability/ fluid overload as continues to have difficulty handling secretions as  well as cough. Antibiotic Day # 4 9. Acute renal insufficiency: Improved. Off HCTZ at this time. 10. HTN: Will monitor every 8 hours and allow permissive HTN for adequate perfusion. 11. Reactive leucocytosis: Will recheck in am. Follow temp curve for now.  12. Thrush: Will treat with short course of diflucan.   LOS (Days) 1 A FACE TO FACE EVALUATION WAS PERFORMED  Liandro Thelin E 07/03/2014, 8:09 AM

## 2014-07-03 NOTE — Progress Notes (Signed)
Social Work Assessment and Plan Social Work Assessment and Plan  Patient Details  Name: [name] MRN: [id number] Date of Birth: [birth year]  Today's Date: 07/03/2014  Problem List:  Patient Active Problem List   Diagnosis Date Noted  . Left pontine stroke 07/02/2014  . Embolic stroke involving left carotid artery 06/28/2014  . Stroke 06/28/2014  . CAP (community acquired pneumonia) 06/28/2014  . Normocytic anemia 06/28/2014  . Essential hypertension 06/28/2014  . Hypokalemia 06/28/2014   Past Medical History:  Past Medical History  Diagnosis Date  . Hypertension   . Cancer    Past Surgical History:  Past Surgical History  Procedure Laterality Date  . Appendectomy    . Kidney surgery     Social History:  reports that he has quit smoking. He does not have any smokeless tobacco history on file. He reports that he drinks alcohol. He reports that he does not use illicit drugs.  Family / Support Systems Marital Status: Married Patient Roles: Spouse, Parent, Other (Comment) (Retiree) Spouse/Significant Other: Herbert Pun [phone] Children: Liz Osment-[phone]-cell  Other Supports: Another daughter in [place] Anticipated Caregiver: Unsure at this time-wife can only provide supervision Ability/Limitations of Caregiver: Wife has health issues of her own and can only provide supervision Caregiver Availability: 24/7 Family Dynamics: Close knit family who are there for one another, both daughter's have been here and made sure father is doing ok.  Pt was very active prior to this event and he realizes he has a long way to go to recover from this.  Social History Preferred language: English Religion: Catholic Cultural Background: No issues Education: Secretary/administrator educated Read: Yes Write: Yes Employment Status: Retired Freight forwarder Issues: No issues Guardian/Conservator: None-according to MD pt is not capable of making his own decisions while here.  Will look  toward his wife to make any decisions while here.   Abuse/Neglect Physical Abuse: Denies Verbal Abuse: Denies Sexual Abuse: Denies Exploitation of patient/patient's resources: Denies Self-Neglect: Denies  Emotional Status Pt's affect, behavior adn adjustment status: Pt is motivated and one who has always taken care of himself.  He has prided himself on this and his independence.  His wife stays: " He has always done what he wanted and kept active."  He has a postive attitude and is ready to work on the rehab unit Recent Psychosocial Issues: Was trying to recover from the pnuemonia he had one week prior to this happening. Pyschiatric History: No history deferred depression screen at this time due to tired from am therapy, although he would benefit from Neuro-psych seeing him while he is here at some point.  Will continue to monitor while here. Substance Abuse History: Social drinker-daily drink at night. Quit tobacco-15 years ago  Patient / Family Perceptions, Expectations & Goals Pt/Family understanding of illness & functional limitations: Pt and wife can explain his stroke and deficits.  Wife is here daily and observing pt in therapies today to see how he is doing.  Both realize he has a long way to go to recover from this stroke.  Wife can assist some but not provide physical care.  Both talk wiht MD rounding and ask questions, feel concerns are being addressed. Premorbid pt/family roles/activities: husband, Father, grandfather, retiree, golfer, chruch member, etc Anticipated changes in roles/activities/participation: resume Pt/family expectations/goals: Pt states: " I need to make a lot of progress while I'm here."  Wife states: " I am hopeful he will do well here and am glad he is here."  [employer]  Resources [employer] Scripts: None Premorbid Home Care/DME Agencies: None Transportation available at discharge: Family Resource referrals recommended: Support group (specify),  Neuropsychology  Discharge Planning Living Arrangements: Spouse/significant other Support Systems: Spouse/significant other, Children, Other relatives, Friends/neighbors, Social worker community Type of Residence: Private residence Insurance Resources: [employer], Multimedia programmer (specify) Nurse, mental health) Financial Resources: Fish farm manager, Other (Comment) (pension) Museum/gallery curator Screen Referred: No Living Expenses: Lives with family Money Management: Patient, Spouse Does the patient have any problems obtaining your medications?: No Home Management: Both-has a houskeeper to clean Patient/Family Preliminary Plans: Discharge plan depends upon pt's progress, wife can provide supervision level, aware of hiring assist and of option of NHP.  They plan to await his progress and decide from this.  Will work with them on the most appropriate plan.  Pt has been on the board of [place] and this may be an option at discharge. Social Work Anticipated Follow Up Needs: HH/OP, SNF, Support Group  Clinical Impression Very pleasant couple who are willing to do what they need to do to assist pt.  Wife plans to be here daily and observe pt in therapies, so she can see his progress.  Daughter's are both out of town and limited due to [place] and families.  Aware of the options hired assist and short term NHP.  Will await pt's progress and develop a safe and appropriate discharge plan.  Elease Hashimoto 07/03/2014, 11:06 AM

## 2014-07-03 NOTE — Evaluation (Signed)
Occupational Therapy Assessment and Plan  Patient Details  Name: [name] MRN: [id number] Date of Birth: July 03, 1926  OT Diagnosis: abnormal posture, cognitive deficits, disturbance of vision, hemiplegia affecting non-dominant side and muscle weakness (generalized) Rehab Potential: Rehab Potential (ACUTE ONLY): Good ELOS: 21-28 days   Today's Date: 07/03/2014 OT Individual Time: 1497-0263 OT Individual Time Calculation (min): 64 min     Problem List:  Patient Active Problem List   Diagnosis Date Noted  . Left pontine stroke 07/02/2014  . Embolic stroke involving left carotid artery 06/28/2014  . Stroke 06/28/2014  . CAP (community acquired pneumonia) 06/28/2014  . Normocytic anemia 06/28/2014  . Essential hypertension 06/28/2014  . Hypokalemia 06/28/2014    Past Medical History:  Past Medical History  Diagnosis Date  . Hypertension   . Cancer    Past Surgical History:  Past Surgical History  Procedure Laterality Date  . Appendectomy    . Kidney surgery      Assessment & Plan Clinical Impression: [name] is a 80 y.o. left handed male with history of HTN, prostate cancer, recent PNA who was admitted on 06/27/14 with right sided weakness with right gaze preference and difficulty talking. Patient was noted to be in A fib in setting of CAP and CT head without acute changes. NIHSS- 14. 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 2D echo with EF 55-60% with mild focal basal atrophy and grade 1 diastolic dysfunction. Carotid dopplers without significant stenosis. 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 Also started on a statin. PNA likely aspiration. He was initially started on Levaquin. His white count was high  and then started improving and is now fluctuating. He is afebrile. Saturating well on oxygen. He'll be prescribed some Robitussin to help him expectorate. He was changed over to oral Augmentin. This can be continued for a total course of 10 days. Recommend repeating chest x-ray in 4-6 weeks.. Patient transferred to CIR on 07/02/2014 .   Patient currently requires total assist +2-max assist  with basic self-care skills secondary to muscle weakness, decreased cardiorespiratoy endurance, impaired timing and sequencing, abnormal tone and decreased coordination, decreased attention to right and right side neglect, decreased problem solving, decreased safety awareness, decreased memory and delayed processing and decreased sitting balance, decreased standing balance, decreased postural control, hemiplegia and decreased balance strategies.  Prior to hospitalization, patient could complete BADLs with independent .  Patient will benefit from skilled intervention to decrease level of assist with basic self-care skills prior to discharge home with care partner.  Anticipate patient will require minimal physical assistance and moderate physical assestance and follow up home health.  OT - End of Session Activity Tolerance: Decreased this session Endurance Deficit: Yes Endurance Deficit Description: rest breaks OT Assessment Rehab Potential (ACUTE ONLY): Good Barriers to Discharge: Decreased caregiver support OT Patient demonstrates impairments in the following area(s): Balance;Pain;Perception;Cognition;Safety;Sensory;Endurance;Motor;Vision OT Basic ADL's Functional Problem(s): Grooming;Bathing;Dressing;Toileting;Eating OT Transfers Functional Problem(s): Toilet;Tub/Shower OT Additional Impairment(s): Fuctional Use of Upper Extremity (RUE (non-dominant)) OT Plan OT Intensity: Minimum of 1-2 x/day, 45 to 90 minutes OT Frequency: 5 out of 7 days OT Duration/Estimated Length of Stay: 21-28 days OT  Treatment/Interventions: Balance/vestibular training;Cognitive remediation/compensation;Community reintegration;Discharge planning;DME/adaptive equipment instruction;Functional electrical stimulation;Functional mobility training;Neuromuscular re-education;Psychosocial support;Patient/family education;Self Care/advanced ADL retraining;Therapeutic Activities;Therapeutic Exercise;Splinting/orthotics;UE/LE Strength taining/ROM;Visual/perceptual remediation/compensation;UE/LE  Coordination activities;Wheelchair propulsion/positioning OT Self Feeding Anticipated Outcome(s): setup/supervision OT Basic Self-Care Anticipated Outcome(s): min assist overall OT Toileting Anticipated Outcome(s): min assist OT Bathroom Transfers Anticipated Outcome(s): min assist OT Recommendation Patient destination: Home Follow Up Recommendations: Home health OT Equipment Recommended: To be determined   Skilled Therapeutic Intervention OT eval completed with wife present. Discussed role of OT, goals of therapy, rehab unit, fall risk, follow-up, and DME/AE. Pt received sitting in w/c following SLP eval. Completed bathing and dressing from w/c at sink. Pt required total assist for positioning of RUE during task, however initiated washing RUE/RLE. Pt completed sit<>stand with max assist, demonstrating flexed posture. Required second person to assist with hygiene and clothing management. Completed squat pivot transfer w/c<>BSC with total assist and max cues. Pt demonstrating pushing tendencies with LUE during transfer. At end of session pt left sitting in w/c with half lap tray placed for positioning of RUE.  OT Evaluation Precautions/Restrictions  Precautions Precautions: Fall Precaution Comments: R hemiplegia Restrictions Weight Bearing Restrictions: No General   Vital Signs  Pain Pain Assessment Pain Assessment: No/denies pain Home Living/Prior Functioning Home Living Available Help at Discharge: Family, Other  (Comment) (most likely hire assist) Type of Home: House Home Access: Stairs to enter Technical brewer of Steps: 4 Entrance Stairs-Rails: Left Home Layout: Two level, Able to live on main level with bedroom/bathroom  Lives With: Spouse Prior Function Level of Independence: Independent with basic ADLs, Independent with homemaking with ambulation, Independent with transfers Vocation: Retired ADL   Vision/Perception  Vision- History Baseline Vision/History: Wears glasses Wears Glasses: Reading only Patient Visual Report: No change from baseline Vision- Assessment Vision Assessment?: Yes Eye Alignment: Within Functional Limits Ocular Range of Motion: Restricted on the right;Restricted on the left Alignment/Gaze Preference: Gaze right Tracking/Visual Pursuits: Requires cues, head turns, or add eye shifts to track;Decreased smoothness of horizontal tracking Saccades: Additional head turns occurred during testing Visual Fields: No apparent deficits Additional Comments: pt very fatigued; will continue to assess during function  Cognition Overall Cognitive Status: Impaired/Different from baseline Arousal/Alertness: Awake/alert Orientation Level: Oriented X4 Attention: Sustained Sustained Attention: Appears intact Memory: Impaired Memory Impairment: Decreased recall of new information;Decreased long term memory Decreased Long Term Memory: Verbal basic Awareness: Appears intact Problem Solving: Impaired Problem Solving Impairment: Functional basic Safety/Judgment: Appears intact Sensation Sensation Light Touch: Appears Intact Hot/Cold: Appears Intact Proprioception: Impaired by gross assessment Coordination Gross Motor Movements are Fluid and Coordinated: No Fine Motor Movements are Fluid and Coordinated: No Finger Nose Finger Test: unable to complete with L (dense hemiparesis) Motor  Motor Motor: Hemiplegia;Abnormal postural alignment and control Mobility  Bed  Mobility Bed Mobility: Sit to Supine Transfers Transfers: Sit to Stand;Stand to Sit Sit to Stand: 1: +2 Total assist Sit to Stand Details: Manual facilitation for weight bearing;Manual facilitation for weight shifting;Tactile cues for initiation;Tactile cues for weight shifting;Tactile cues for posture;Verbal cues for sequencing;Verbal cues for precautions/safety;Tactile cues for placement;Verbal cues for technique Stand to Sit: 1: +2 Total assist Stand to Sit Details (indicate cue type and reason): Tactile cues for initiation;Tactile cues for weight shifting;Tactile cues for posture;Verbal cues for sequencing;Manual facilitation for weight bearing;Manual facilitation for weight shifting;Verbal cues for precautions/safety;Tactile cues for placement;Verbal cues for technique  Trunk/Postural Assessment  Cervical Assessment Cervical Assessment: Exceptions to [facility] (forward head) Thoracic Assessment Thoracic Assessment: Exceptions to [facility] (kyphotic) Lumbar Assessment Lumbar Assessment: Exceptions to [facility] (posterior pelvic tilt)  Balance   Extremity/Trunk Assessment RUE Assessment RUE Assessment: Exceptions to [facility] RUE PROM (degrees) Overall PROM  Right Upper Extremity: Within functional limits for tasks performed RUE Overall PROM Comments: full PROM; dense hemiparesis RUE Tone RUE Tone: Modified Ashworth Modified Ashworth Scale for Grading Hypertonia RUE: No increase in muscle tone LUE Assessment LUE Assessment: Within Functional Limits (grossly 4/5 overall)  FIM:  FIM - Eating Eating Activity: 4: Help with managing cup/glass;4: Helper occasionally brings food to mouth;4: Helper checks for pocketed food;5: Needs verbal cues/supervision;5: Set-up assist for open containers FIM - Grooming Grooming Steps: Wash, rinse, dry face Grooming: 2: Patient completes 1 of 4 or 2 of 5 steps FIM - Bathing Bathing Steps Patient Completed: Chest;Right Arm;Abdomen;Right upper leg;Left upper leg Bathing: 1:  Two helpers FIM - Upper Body Dressing/Undressing Upper body dressing/undressing: 0: Wears gown/pajamas-no public clothing FIM - Lower Body Dressing/Undressing Lower body dressing/undressing: 1: Two helpers FIM - Radio producer Devices: Recruitment consultant Transfers: 1-From toilet/BSC: Total A (helper does all/Pt. < 25%)   Refer to Care Plan for Long Term Goals  Recommendations for other services: None  Discharge Criteria: Patient will be discharged from OT if patient refuses treatment 3 consecutive times without medical reason, if treatment goals not met, if there is a change in medical status, if patient makes no progress towards goals or if patient is discharged from hospital.  The above assessment, treatment plan, treatment alternatives and goals were discussed and mutually agreed upon: by patient and by family  Duayne Cal 07/03/2014, 10:49 AM

## 2014-07-03 NOTE — Evaluation (Signed)
Speech Language Pathology Assessment and Plan  Patient Details  Name: [name] MRN: [id number] Date of Birth: Jan 11, 1926  SLP Diagnosis: Dysarthria;Cognitive Impairments;Dysphagia  Rehab Potential: Good ELOS: 21-24 days    Today's Date: 07/03/2014 SLP Individual Time: 6314-9702 SLP Individual Time Calculation (min): 60 min   Problem List:  Patient Active Problem List   Diagnosis Date Noted  . Left pontine stroke 07/02/2014  . Embolic stroke involving left carotid artery 06/28/2014  . Stroke 06/28/2014  . CAP (community acquired pneumonia) 06/28/2014  . Normocytic anemia 06/28/2014  . Essential hypertension 06/28/2014  . Hypokalemia 06/28/2014   Past Medical History:  Past Medical History  Diagnosis Date  . Hypertension   . Cancer    Past Surgical History:  Past Surgical History  Procedure Laterality Date  . Appendectomy    . Kidney surgery      Assessment / Plan / Recommendation Clinical Impression [name] is an 80 year old left handed male with history of HTN, prostate cancer, recent PNA with malaise x1 week who was admitted on 06/27/14 with right sided weakness with right gaze preference and difficulty talking. Patient was noted to be in A-fib in setting of CAP and CT head without acute changes. MRI/MRA head with acute nonhemorrhagic left paramedian pontine with extensive atrophy and white matter disease. Speech-language evaluation reveals moderate to severe dysarthria with perseveration, echolalia, impaired cognition as well as difficulty following one step commands. MBS with delayed swallow with silent penetration of thin and nectar with straws; he was started on dysphagia 1 diet with nectar-thick liquids but continues to have coughing episodes due to difficulty handling secretions. Patient with subsequent right hemiparesis with sensory deficits, left inattention, dysphagia, severe dysarthria as well as cognitive linguistic deficits.   Patient admitted to  [facility] 07/02/13 and continues to demonstrate moderately severe dysarthria, cognitive deficits with basic tasks as well as an oral and pharyngeal based sensorimotor dysphagia.  Patient now able to follow 2 step commands with break down at 3-step and perseveration/echolalia appear to have resolved.  Patient would benefit from skilled SLP intervention to maximize overall swallow function and cognitive-linguistic abilities, in order to maximize his functional independence prior to discharge. Anticipate patient will require 24 hour supervision at home and follow up SLP services.     Skilled Therapeutic Interventions          Bedside swallow and cognitive-linguistic evaluations completed with results and recommendations reviewed with patient and spouse.     SLP Assessment  Patient will need skilled Speech Lanaguage Pathology Services during CIR admission    Recommendations  Diet Recommendations: Dysphagia 1 (Puree);Nectar-thick liquid Liquid Administration via: Cup;No straw;Spoon Medication Administration: Crushed with puree Supervision: Patient able to self feed;Full supervision/cueing for compensatory strategies Compensations: Slow rate;Small sips/bites;Multiple dry swallows after each bite/sip;Check for pocketing;Check for anterior loss Postural Changes and/or Swallow Maneuvers: Seated upright 90 degrees;Upright 30-60 min after meal Oral Care Recommendations: Oral care BID;Oral care before and after PO Patient destination: Home Follow up Recommendations: Home Health SLP;24 hour supervision/assistance Equipment Recommended: To be determined    SLP Frequency 5 out of 7 days   SLP Treatment/Interventions Cognitive remediation/compensation;Cueing hierarchy;Dysphagia/aspiration precaution training;Environmental controls;Functional tasks;Internal/external aids;Oral motor exercises;Patient/family education;Speech/Language facilitation;Therapeutic Activities    Pain Pain  Assessment Pain Assessment: No/denies pain Prior Functioning Cognitive/Linguistic Baseline: Within functional limits Type of Home: House  Lives With: Spouse Vocation: Retired  Industrial/product designer Term Goals: Week 1: SLP Short Term Goal 1 (Week 1): Patient will consume Dys.1 textures  and nectar-thick liquids via cup with Min verbal cues to utilize safe swallow strategies to minimize overt s/s of aspiration. SLP Short Term Goal 2 (Week 1): Patient will produce intelligible speech at the phrase level with Mod multimodal cues to utilize compensatory strategies. SLP Short Term Goal 3 (Week 1): Patient will initiate basic self-care tasks with no more than 2 verbal cues.   SLP Short Term Goal 4 (Week 1): Patient will request help as needed with Mod question cues.  See FIM for current functional status Refer to Care Plan for Long Term Goals  Recommendations for other services: None  Discharge Criteria: Patient will be discharged from SLP if patient refuses treatment 3 consecutive times without medical reason, if treatment goals not met, if there is a change in medical status, if patient makes no progress towards goals or if patient is discharged from hospital.  The above assessment, treatment plan, treatment alternatives and goals were discussed and mutually agreed upon: by patient and by family  Carmelia Roller., [place]  [place] 07/03/2014, 5:15 PM

## 2014-07-03 NOTE — Care Management Note (Signed)
[facility] Individual Statement of Services  Patient Name:  [name]  Date:  07/03/2014  Welcome to the [facility].  Our goal is to provide you with an individualized program based on your diagnosis and situation, designed to meet your specific needs.  With this comprehensive rehabilitation program, you will be expected to participate in at least 3 hours of rehabilitation therapies Monday-Friday, with modified therapy programming on the weekends.  Your rehabilitation program will include the following services:  Physical Therapy (PT), Occupational Therapy (OT), Speech Therapy (ST), 24 hour per day rehabilitation nursing, Therapeutic Recreaction (TR), Neuropsychology, Case Management (Social Worker), Rehabilitation Medicine, Nutrition Services and Pharmacy Services  Weekly team conferences will be held on Wednesday to discuss your progress.  Your Social Worker will talk with you frequently to get your input and to update you on team discussions.  Team conferences with you and your family in attendance may also be held.  Expected length of stay: 21-28 days  Overall anticipated outcome: min level of assist  Depending on your progress and recovery, your program may change. Your Social Worker will coordinate services and will keep you informed of any changes. Your Social Worker's name and contact numbers are listed  below.  The following services may also be recommended but are not provided by the [facility] will be made to provide these services after discharge if needed.  Arrangements include referral to agencies that provide these services.  Your insurance has been verified to be:  [place] Your primary doctor is:  Gaynelle Arabian  Pertinent information will be shared with your doctor and your insurance  company.  Social Worker:  Ovidio Kin, [place] or ([phone]  Information discussed with and copy given to patient by: Elease Hashimoto, 07/03/2014, 9:57 AM

## 2014-07-04 ENCOUNTER — Inpatient Hospital Stay (HOSPITAL_COMMUNITY): Payer: Medicare Other | Admitting: Occupational Therapy

## 2014-07-04 ENCOUNTER — Inpatient Hospital Stay (HOSPITAL_COMMUNITY): Payer: Medicare Other | Admitting: *Deleted

## 2014-07-04 ENCOUNTER — Inpatient Hospital Stay (HOSPITAL_COMMUNITY): Payer: Medicare Other | Admitting: Speech Pathology

## 2014-07-04 ENCOUNTER — Inpatient Hospital Stay (HOSPITAL_COMMUNITY): Payer: Medicare Other | Admitting: Rehabilitation

## 2014-07-04 DIAGNOSIS — G8103 Flaccid hemiplegia affecting right nondominant side: Secondary | ICD-10-CM

## 2014-07-04 MED ORDER — SODIUM CHLORIDE 0.45 % IV SOLN
INTRAVENOUS | Status: DC
  Administered 2014-07-04 – 2014-07-11 (×6): via INTRAVENOUS
  Administered 2014-07-11: 75 mL/h via INTRAVENOUS
  Administered 2014-07-12 – 2014-07-16 (×2): via INTRAVENOUS
  Administered 2014-07-16: 1 mL via INTRAVENOUS
  Administered 2014-07-17: 19:00:00 via INTRAVENOUS
  Administered 2014-07-18: 75 mL/h via INTRAVENOUS
  Administered 2014-07-18: 1000 mL via INTRAVENOUS
  Administered 2014-07-19 – 2014-07-20 (×2): via INTRAVENOUS
  Administered 2014-07-25: 900 mL via INTRAVENOUS
  Administered 2014-07-26 – 2014-07-28 (×2): via INTRAVENOUS

## 2014-07-04 MED ORDER — BACLOFEN 5 MG HALF TABLET
5.0000 mg | ORAL_TABLET | Freq: Three times a day (TID) | ORAL | Status: DC | PRN
  Filled 2014-07-04 (×2): qty 1

## 2014-07-04 NOTE — Progress Notes (Signed)
Recreational Therapy Assessment and Plan  Patient Details  Name: JAMIER URBAS MRN: [id number] Date of Birth: May 06, 1926 Today's Date: 07/04/2014  Rehab Potential: Good ELOS: 4 weeks   Assessment Clinical Impression: Problem List:  Patient Active Problem List   Diagnosis Date Noted  . Left pontine stroke 07/02/2014  . Embolic stroke involving left carotid artery 06/28/2014  . Stroke 06/28/2014  . CAP (community acquired pneumonia) 06/28/2014  . Normocytic anemia 06/28/2014  . Essential hypertension 06/28/2014  . Hypokalemia 06/28/2014    Past Medical History:  Past Medical History  Diagnosis Date  . Hypertension   . Cancer    Past Surgical History:  Past Surgical History  Procedure Laterality Date  . Appendectomy    . Kidney surgery      Assessment & Plan Clinical Impression: EXODUS KUTZER is a 80 y.o. left handed male with history of HTN, prostate cancer, recent PNA who was admitted on 06/27/14 with right sided weakness with right gaze preference and difficulty talking. Patient was noted to be in A fib in setting of CAP and CT head without acute changes. NIHSS- 14. 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 2D echo with EF 55-60% with mild focal basal atrophy and grade 1 diastolic dysfunction. Carotid dopplers without significant stenosis. 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 Also started on a statin. PNA likely aspiration. He was initially started on Levaquin. His white count was high and then started improving and is now fluctuating. He is afebrile. Saturating well on oxygen. He'll be prescribed some Robitussin to help him expectorate. He was changed over to oral  Augmentin. This can be continued for a total course of 10 days. Recommend repeating chest x-ray in 4-6 weeks.. Patient transferred to CIR on 07/02/2014.   Pt presents with decreased activity tolerance, decreased functional mobility, decreased balanced, decreased coordination, decreased midline orientation, right sided weakness, dysarthria, dysphagia, decreased awareness, decreased problem solving, decreased memory Limiting pt's independence with leisure/community pursuits.   Leisure History/Participation Premorbid leisure interest/current participation: Community - Building control surveyor - Travel (Comment);Community - Shopping mall;Sports - Golf Expression Interests: Music (Comment);Singing Leisure Participation Style: With Family/Friends Awareness of Community Resources: Good-identify 3 post discharge leisure resources Psychosocial / Spiritual Spiritual Interests: Church Patient agreeable to Pet Therapy: Yes Does patient have pets?: No (daughter has a Magazine features editor, discussed Biochemist, clinical) Social interaction - Mood/Behavior: Cooperative Engineer, drilling for Education?: Yes Strengths/Weaknesses Patient Strengths/Abilities: Willingness to participate;Active premorbidly Patient weaknesses: Physical limitations TR Patient demonstrates impairments in the following area(s): Endurance;Motor;Safety;Skin Integrity  Plan Rec Therapy Plan Is patient appropriate for Therapeutic Recreation?: Yes Rehab Potential: Good Treatment times per week: Min 1 time per week >20 minutes Estimated Length of Stay: 4 weeks TR Treatment/Interventions: Adaptive equipment instruction;1:1 session;Balance/vestibular training;Functional mobility training;Community reintegration;Cognitive remediation/compensation;Patient/family education;Therapeutic activities;Recreation/leisure participation;Provide activity resources in room;Therapeutic exercise;UE/LE Coordination activities;Visual/perceptual  remediation/compensation;Wheelchair propulsion/positioning  Recommendations for other services: None  Discharge Criteria: Patient will be discharged from TR if patient refuses treatment 3 consecutive times without medical reason.  If treatment goals not met, if there is a change in medical status, if patient makes no progress towards goals or if patient is discharged from hospital.  The above assessment, treatment plan, treatment alternatives and goals were discussed and mutually agreed upon: by patient  [place] 07/04/2014,  4:25 PM

## 2014-07-04 NOTE — Progress Notes (Signed)
Speech Language Pathology Daily Session Note  Patient Details  Name: [name] MRN: [id number] Date of Birth: 1926/01/17  Today's Date: 07/04/2014 SLP Individual Time: 0930-1030 SLP Individual Time Calculation (min): 60 min  Short Term Goals: Week 1: SLP Short Term Goal 1 (Week 1): Patient will consume Dys.1 textures and nectar-thick liquids via cup with Min verbal cues to utilize safe swallow strategies to minimize overt s/s of aspiration. SLP Short Term Goal 2 (Week 1): Patient will produce intelligible speech at the phrase level with Mod multimodal cues to utilize compensatory strategies. SLP Short Term Goal 3 (Week 1): Patient will initiate basic self-care tasks with no more than 2 verbal cues.   SLP Short Term Goal 4 (Week 1): Patient will request help as needed with Mod question cues.  Skilled Therapeutic Interventions: Skilled treatment session focused on addressing dysphagia and dysarthria goals.  Upon SLP arrival, RN present administering medications and SLP provided Min verbal and Mod physical assist to facilitate safety with Dys.1 textures and nectar-thick liquids via spoon per patient request.  Patient demonstrated no overt s/s of aspiration.  SLP also facilitated session by educating patient and spouse regarding oral motor strengthening exercises to complete between therapy sessions.  Patient then utilized previously taught speech intelligibly compensatory strategies: head up, slow and precise articulation at the word-phrase level with Max multimodal cues.     FIM:  Comprehension Comprehension Mode: Auditory Comprehension: 4-Understands basic 75 - 89% of the time/requires cueing 10 - 24% of the time Expression Expression Mode: Verbal Expression: 2-Expresses basic 25 - 49% of the time/requires cueing 50 - 75% of the time. Uses single words/gestures. Social Interaction Social Interaction: 4-Interacts appropriately 75 - 89% of the time - Needs redirection for appropriate  language or to initiate interaction. Problem Solving Problem Solving: 2-Solves basic 25 - 49% of the time - needs direction more than half the time to initiate, plan or complete simple activities Memory Memory: 3-Recognizes or recalls 50 - 74% of the time/requires cueing 25 - 49% of the time FIM - Eating Eating Activity: 4: Help with managing cup/glass;4: Helper occasionally brings food to mouth;4: Helper checks for pocketed food;5: Needs verbal cues/supervision;4: Helper occasionally scoops food on utensil;5: Set-up assist for open containers  Pain Pain Assessment Pain Assessment: No/denies pain  Therapy/Group: Individual Therapy  Carmelia Roller., CCC-SLP [phone]  [place] 07/04/2014, 3:56 PM

## 2014-07-04 NOTE — Progress Notes (Signed)
Social Work Patient ID: [name], male   DOB: [birth year], 80 y.o.   MRN: [id number] Met with pt, wife and daughter to discuss team conference goals-min level and targeted discharge 1/4.  All were pleased with the progress he made since yesterday. Have given them private duty list to begin the hiring process for pt's care at discharge.  Pt wants to go home but be ready and have the care he requires.  Wife is here Daily and will assist with what she can for him.  Will provide support and work on discharge plans.  Vet faxed shot records for their dog to visit now.

## 2014-07-04 NOTE — Patient Care Conference (Signed)
Inpatient RehabilitationTeam Conference and Plan of Care Update Date: 07/04/2014   Time: 11;45 AM    Patient Name: [name]      Medical Record Number: [id number]  Date of Birth: 03-Jun-1926 Sex: Male         Room/Bed: 4W18C/4W18C-01 Payor Info: Payor: MEDICARE / Plan: MEDICARE PART A AND B / Product Type: *No Product type* /    Admitting Diagnosis: CVA  Admit Date/Time:  07/02/2014  5:47 PM Admission Comments: No comment available   Primary Diagnosis:  Left pontine stroke Principal Problem: Left pontine stroke  Patient Active Problem List   Diagnosis Date Noted  . Right hemiparesis 07/05/2014  . Dysphagia, post-stroke 07/05/2014  . Dysarthria due to recent cerebral infarction 07/05/2014  . Left pontine stroke 07/02/2014  . Embolic stroke involving left carotid artery 06/28/2014  . Stroke 06/28/2014  . CAP (community acquired pneumonia) 06/28/2014  . Normocytic anemia 06/28/2014  . Essential hypertension 06/28/2014  . Hypokalemia 06/28/2014    Expected Discharge Date: Expected Discharge Date: 07/30/14  Team Members Present: Physician leading conference: Dr. Alysia Penna Nurse Present: Rayetta Pigg, RN PT Present: Carney Living, Jimmie Molly, PT OT Present: Clyda Greener, Rhetta Mura, OT SLP Present: Gunnar Fusi, SLP PPS Coordinator present : Daiva Nakayama, RN, CRRN     Current Status/Progress Goal Weekly Team Focus  Medical   right flaccid hemiparesis, PNA resolving, diarrhea from abx  maintain med stability  initiate rehab program   Bowel/Bladder   Incontinent of bowel and bladder. LBM 07/03/14 x3  Mod assist   Timed toileting q2-3 hours   Swallow/Nutrition/ Hydration   Dys. 1 textures and nectar-thiclks liquids with Max assist   least restrictive PO intake with Min assist   increase endurance and PO intake    ADL's   Max A for bed mobility, +2 for STS for clothing management, max A squat pivot transfers, Mod  - total A for self care tasks  Min  A overall  Pt education, attention to R, functional transfers/mobility, pt and family education, safety awareness, use of R UE as stabilitzer.    Mobility   max A for bed mobility, max A squat pivot transfers, +2 for standing, gait and stairs not performed yet due to safety.    Min A overall  R NMR, attention to the R, w/c mobility, bed mobility, transfers, gait, pt/family education   Communication   Max assist   Min assist   increase breath support and overall endurance    Safety/Cognition/ Behavioral Observations  Mod assist   Supervision   increase endurance and basic problem solving   Pain   Denies pain  <2 on 0-10 pain scale  Assess pain q4hr, medicate as needed   Skin   Scab to scalp from melanoma removal, OTA; redness and yeast to scrotum, MGP applied; penis red and tender; lips dry and peeling, vaseline applied  No new skin breakdown while on rehab  Assess skin qshift      *See Care Plan and progress notes for long and short-term goals.  Barriers to Discharge: elderly wife who uses cane    Possible Resolutions to Barriers:  ? if daughter can assist post d/c    Discharge Planning/Teaching Needs:  Based upon goals and pt's progress-wife can only provide supervision level, discussing hired assist and/or NHP      Team Discussion:  Goals-min level-inattention to right and flaccid.  Wants to go home and will hire assistance.  Concerned about his hiccups-MD addressing.  Pleased with progress since yesterday.  Revisions to Treatment Plan:  None   Continued Need for Acute Rehabilitation Level of Care: The patient requires daily medical management by a physician with specialized training in physical medicine and rehabilitation for the following conditions: Daily direction of a multidisciplinary physical rehabilitation program to ensure safe treatment while eliciting the highest outcome that is of practical value to the patient.: Yes Daily medical management of patient stability for  increased activity during participation in an intensive rehabilitation regime.: Yes Daily analysis of laboratory values and/or radiology reports with any subsequent need for medication adjustment of medical intervention for : Neurological problems  Elease Hashimoto 07/05/2014, 8:38 AM

## 2014-07-04 NOTE — Progress Notes (Signed)
Subjective/Complaints: 80 y.o. left handed male with history of HTN, prostate cancer, recent PNA with malaise X 1 week who was admitted on 06/27/14 with right sided weakness with right gaze preference and difficulty talking. Patient was noted to be in A fib in setting of CAP and CT head without acute changes. EKG evaluated by cardiology and felt to have multiple artifacts with 1 degree AVB and repeat EKG with NSR. NIHSS- 14. 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 2D echo with EF 55-60% with mild focal basal atrophy and grade 1 diastolic dysfunction. Carotid dopplers without significant stenosis. 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 Speech/language evaluation revals moderate to severe dysarthria with perseveration, echolalia, impaired cognition as well as difficulty following one step commands  Slept poorly per family TRace pec movement noted by OT No cough or SOB Objective: Vital Signs: Blood pressure 137/69, pulse 61, temperature 98.2 F (36.8 C), temperature source Oral, resp. rate 18, height 5' 9" (1.753 m), weight 66.2 kg (145 lb 15.1 oz), SpO2 97 %. Dg Chest 2 View  07/02/2014   CLINICAL DATA:  Acute shortness of breath for 5 days, pneumonia  EXAM: CHEST - 2 VIEW  COMPARISON:  06/28/2012  FINDINGS: Patchy right lower lobe airspace process compatible with pneumonia. Little interval change. Normal heart size and vascularity. Left lung remains clear. No developing effusion or pneumothorax. Apical scarring noted. Atherosclerotic calcifications of the aorta and subclavian vessels.  IMPRESSION: No significant change in right lower lobe airspace process compatible with pneumonia.   Electronically Signed   By: Daryll Brod M.D.    On: 07/02/2014 21:19   Results for orders placed or performed during the hospital encounter of 07/02/14 (from the past 72 hour(s))  Urinalysis, Routine w reflex microscopic     Status: None   Collection Time: 07/02/14  7:02 PM  Result Value Ref Range   Color, Urine YELLOW YELLOW   APPearance CLEAR CLEAR   Specific Gravity, Urine 1.024 1.005 - 1.030   pH 5.5 5.0 - 8.0   Glucose, UA NEGATIVE NEGATIVE mg/dL   Hgb urine dipstick NEGATIVE NEGATIVE   Bilirubin Urine NEGATIVE NEGATIVE   Ketones, ur NEGATIVE NEGATIVE mg/dL   Protein, ur NEGATIVE NEGATIVE mg/dL   Urobilinogen, UA 1.0 0.0 - 1.0 mg/dL   Nitrite NEGATIVE NEGATIVE   Leukocytes, UA NEGATIVE NEGATIVE    Comment: MICROSCOPIC NOT DONE ON URINES WITH NEGATIVE PROTEIN, BLOOD, LEUKOCYTES, NITRITE, OR GLUCOSE <1000 mg/dL.  Urine culture     Status: None   Collection Time: 07/02/14  7:02 PM  Result Value Ref Range   Specimen Description URINE, RANDOM    Special Requests NONE    Culture  Setup Time      07/03/2014 03:55 Performed at [facility] Performed at [facility]     Culture NO GROWTH Performed at [facility]     Report Status 07/03/2014 FINAL   CBC WITH DIFFERENTIAL     Status: Abnormal   Collection Time: 07/03/14  3:49 AM  Result Value Ref Range   WBC 17.0 (H) 4.0 - 10.5 K/uL   RBC 4.20 (L) 4.22 - 5.81 MIL/uL   Hemoglobin 11.9 (L) 13.0 - 17.0 g/dL   HCT 37.3 (L) 39.0 -  52.0 %   MCV 88.8 78.0 - 100.0 fL   MCH 28.3 26.0 - 34.0 pg   MCHC 31.9 30.0 - 36.0 g/dL   RDW 13.1 11.5 - 15.5 %   Platelets 341 150 - 400 K/uL   Neutrophils Relative % 85 (H) 43 - 77 %   Neutro Abs 14.3 (H) 1.7 - 7.7 K/uL   Lymphocytes Relative 8 (L) 12 - 46 %   Lymphs Abs 1.3 0.7 - 4.0 K/uL   Monocytes Relative 6 3 - 12 %   Monocytes Absolute 1.1 (H) 0.1 - 1.0 K/uL   Eosinophils Relative 1 0 - 5 %   Eosinophils Absolute 0.2 0.0 - 0.7 K/uL   Basophils Relative 0 0 - 1 %   Basophils Absolute  0.1 0.0 - 0.1 K/uL  Comprehensive metabolic panel     Status: Abnormal   Collection Time: 07/03/14  3:49 AM  Result Value Ref Range   Sodium 142 137 - 147 mEq/L   Potassium 4.1 3.7 - 5.3 mEq/L   Chloride 109 96 - 112 mEq/L   CO2 22 19 - 32 mEq/L   Glucose, Bld 124 (H) 70 - 99 mg/dL   BUN 25 (H) 6 - 23 mg/dL   Creatinine, Ser 0.67 0.50 - 1.35 mg/dL   Calcium 9.0 8.4 - 10.5 mg/dL   Total Protein 5.7 (L) 6.0 - 8.3 g/dL   Albumin 1.9 (L) 3.5 - 5.2 g/dL   AST 27 0 - 37 U/L   ALT 23 0 - 53 U/L   Alkaline Phosphatase 108 39 - 117 U/L   Total Bilirubin 0.3 0.3 - 1.2 mg/dL   GFR calc non Af Amer 85 (L) >90 mL/min   GFR calc Af Amer >90 >90 mL/min    Comment: (NOTE) The eGFR has been calculated using the CKD EPI equation. This calculation has not been validated in all clinical situations. eGFR's persistently <90 mL/min signify possible Chronic Kidney Disease.    Anion gap 11 5 - 15  Clostridium Difficile by PCR     Status: None   Collection Time: 07/03/14  4:10 PM  Result Value Ref Range   C difficile by pcr NEGATIVE NEGATIVE     HEENT: normal Cardio: RRR Resp: CTA B/L GI: BS positive and NT,ND Extremity:  Edema Right hand and Right foot Skin:   Wound vertex of scalp, eschar Neuro: Lethargic, Cranial Nerve Abnormalities Right central 7, Abnormal Sensory reduced on left side to LT, Abnormal Motor 0/5 on right side and Tone:  Hypotonia Musc/Skel:  Other no pain with Right shoulder ROM Gen NAD   Assessment/Plan: 1. Functional deficits secondary to left paramedian pontine infarct which require 3+ hours per day of interdisciplinary therapy in a comprehensive inpatient rehab setting. Physiatrist is providing close team supervision and 24 hour management of active medical problems listed below. Physiatrist and rehab team continue to assess barriers to discharge/monitor patient progress toward functional and medical goals. Team conference today please see physician documentation under  team conference tab, met with team face-to-face to discuss problems,progress, and goals. Formulized individual treatment plan based on medical history, underlying problem and comorbidities.FIM: FIM - Bathing Bathing Steps Patient Completed: Chest, Right Arm, Abdomen, Right upper leg, Left upper leg Bathing: 1: Two helpers  FIM - Upper Body Dressing/Undressing Upper body dressing/undressing: 0: Wears gown/pajamas-no public clothing FIM - Lower Body Dressing/Undressing Lower body dressing/undressing: 1: Two helpers     FIM - Radio producer Devices: Recruitment consultant  Transfers: 1-From toilet/BSC: Total A (helper does all/Pt. < 25%)  FIM - Bed/Chair Transfer Bed/Chair Transfer Assistive Devices: Arm rests Bed/Chair Transfer: 2: Sit > Supine: Max A (lifting assist/Pt. 25-49%), 1: Two helpers, 1: Mechanical lift  FIM - Locomotion: Wheelchair Distance: 125' Locomotion: Wheelchair: 2: Travels 50 - 149 ft with maximal assistance (Pt: 25 - 49%) FIM - Locomotion: Ambulation Locomotion: Ambulation: 0: Activity did not occur  Comprehension Comprehension Mode: Auditory Comprehension: 4-Understands basic 75 - 89% of the time/requires cueing 10 - 24% of the time  Expression Expression Mode: Verbal Expression: 3-Expresses basic 50 - 74% of the time/requires cueing 25 - 50% of the time. Needs to repeat parts of sentences.  Social Interaction Social Interaction: 4-Interacts appropriately 75 - 89% of the time - Needs redirection for appropriate language or to initiate interaction.  Problem Solving Problem Solving: 2-Solves basic 25 - 49% of the time - needs direction more than half the time to initiate, plan or complete simple activities  Memory Memory: 3-Recognizes or recalls 50 - 74% of the time/requires cueing 25 - 49% of the time   Medical Problem List and Plan: 1. Functional deficits secondary to left paramedian pontine infarct 2. DVT  Prophylaxis/Anticoagulation: Pharmaceutical: Lovenox 3. Pain Management: N/A 4. Mood: LCSW to follow for evaluation and support.  5. Neuropsych: This patient is not capable of making decisions on his own behalf. 6. Skin/Wound Care:hx melanoma removal scalp, resume vaseline, family to bring in name of cream from dermatologist Routine pressure relief measures. Turn patient every 2 hours when in bed.  7. Fluids/Electrolytes/Nutrition: Monitor I/O. Check daily weights to monitor for signs of overload.  8. CAP: Will check follow CXR to monitor for stability/ fluid overload as continues to have difficulty handling secretions as well as cough. Antibiotic Day # 5 9. Acute renal insufficiency: Improved. Off HCTZ at this time. 10. HTN: Will monitor every 8 hours and allow permissive HTN for adequate perfusion. 11.  leucocytosis: secondary to PNA . Follow temp curve for now.  12. Thrush: Will treat with short course of diflucan.  13.  Diarrhea- C diff negative, antibiotic associated LOS (Days) 2 A FACE TO FACE EVALUATION WAS PERFORMED  Nagee Goates E 07/04/2014, 8:46 AM

## 2014-07-04 NOTE — Plan of Care (Signed)
Problem: RH BOWEL ELIMINATION Goal: RH STG MANAGE BOWEL WITH ASSISTANCE STG Manage Bowel with Min Assistance.  Outcome: Progressing Goal: RH STG MANAGE BOWEL W/MEDICATION W/ASSISTANCE STG Manage Bowel with Medication with Slick.  Outcome: Progressing  Problem: RH BLADDER ELIMINATION Goal: RH STG MANAGE BLADDER WITH ASSISTANCE STG Manage Bladder With Min Assistance  Outcome: Progressing  Problem: RH SKIN INTEGRITY Goal: RH STG SKIN FREE OF INFECTION/BREAKDOWN No new breakdown while on rehab with mod assit of caregiver, total assist of caregiver for skin care to buttocks  Outcome: Progressing Goal: RH STG MAINTAIN SKIN INTEGRITY WITH ASSISTANCE STG Maintain Skin Integrity With Max Assistance.  Outcome: Progressing Goal: RH STG ABLE TO PERFORM INCISION/WOUND CARE W/ASSISTANCE STG Able To Perform Incision/Wound Care With Total Assistance for buttocks and heel care  Outcome: Progressing  Problem: RH SAFETY Goal: RH STG ADHERE TO SAFETY PRECAUTIONS W/ASSISTANCE/DEVICE STG Adhere to Safety Precautions With Supervision Assistance/Device.  Outcome: Progressing  Problem: RH PAIN MANAGEMENT Goal: RH STG PAIN MANAGED AT OR BELOW PT'S PAIN GOAL 3 or less on scale of 1-10  Outcome: Progressing

## 2014-07-04 NOTE — Progress Notes (Signed)
Occupational Therapy Session Note  Patient Details  Name: [name] MRN: [id number] Date of Birth: 1925/09/05  Today's Date: 07/04/2014 OT Individual Time: 0800-0900 OT Individual Time Calculation (min): 60 min    Short Term Goals: Week 1:  OT Short Term Goal 1 (Week 1): Pt will complete toilet transfer with max assist +1 OT Short Term Goal 2 (Week 1): Pt will complete LB dressing with max assist +1 OT Short Term Goal 3 (Week 1): Pt will complete UB dressing with max assist  OT Short Term Goal 4 (Week 1): Pt will sit unsupported during functional task for 2 min with min assist   Skilled Therapeutic Interventions/Progress Updates:  Upon entering room, pt supine in bed with head slightly elevated with wife and daughter present in the room. Patient's wife staying during session to observe. Pt with no c/o pain. Skilled OT session with focus on self care, pt and family education, weight bearing during functional tasks, STS, forward leaning and functional transfers. Supine > sit with Max A. Squat pivot transfer to wheelchair from bed with total A secondary to second helper required for safety. Bathing and dressing performed while seated in wheelchair at sink side. HOH assistance to incorporate R UE in functional tasks such as combing hair, applying deodorant,and washing L UE. Pt focused on forward leans for clothing management, set up for STS, self care, and positioning with assistance and verbal cues. Pt required Mod A of 2 for standing in order to engage in clothing management tasks. Weight bearing performed through R UE while pt in seated position during functional tasks. Pt seated in wheelchair with QRB donned, arm tray donned, and call bell within reach.   Therapy Documentation Precautions:  Precautions Precautions: Fall Precaution Comments: R hemiplegia, R gaze preference Restrictions Weight Bearing Restrictions: No  See FIM for current functional status  Therapy/Group: Individual  Therapy  [name] 07/04/2014, 11:22 AM

## 2014-07-04 NOTE — Progress Notes (Signed)
Physical Therapy Session Note  Patient Details  Name: [name] MRN: [id number] Date of Birth: 01-10-1926  Today's Date: 07/04/2014 PT Individual Time: 1300-1400 PT Individual Time Calculation (min): 60 min   Short Term Goals: Week 1:  PT Short Term Goal 1 (Week 1): Pt to perform bed mobility with mod Ax1person PT Short Term Goal 2 (Week 1): Pt to perform bed<>w/c transfer with max Ax1person PT Short Term Goal 3 (Week 1): Pt to maintain dynamic sitting balance with mod Ax1person PT Short Term Goal 4 (Week 1): Pt to propel w/c 23' with mod Ax1person PT Short Term Goal 5 (Week 1): Pt to ambulate 10' with LRAD and Ax2persons  Skilled Therapeutic Interventions/Progress Updates:   Pt received sitting in w/c in room, agreeable to therapy session.  Wife and daughter present, but only wife attended therapy session.  Skilled session focused on gait in hallway with use of L handrail with focus on upright posture, anterior pelvic tilt, increased forward weight shift over RLE in R stance phase of gait, stair negotiation, and standing balance for NMR of RLE, as well as postural control.  Performed gait x 25' with use of L handrail with +3 assist (PT and OT assisting with pt and +3 for chair follow).  PT providing assist for anterior pelvic tilt at hips/glute area, assist at R knee for stability and for forward weight shift over RLE, also requires assist for advancing RLE (did note some activation at hip flexors).  OT providing assist at trunk for upright posture and maintaining head upright and looking forward.  Progressed to stair negotiation up 3, 4" steps and down 2, 6" steps with use of L handrail with assist as mentioned above for RLE placement and knee stability and assist for upright posture throughout with continued cues for stepping sequence.  Ended session with two bouts of standing while reaching R for horse shoes to increase weight shift and WB through RLE and reaching far to the L and upward for  upright posture and for increased trunk rotation and activation in RLE.   Pt requires +2 assist for standing activity with marked assist at trunk for posture.  Discussed need for new w/c back for better positioning, as he tends to be heavily posteriorly tilted at pelvis.  Retrieved 18" actaback and will don on his chair tomorrow.  Discussed need for better positioning to prevent skin breakdown.  Pt and wife verbalized understanding.  Pt assisted back to room and left in chair with quick release belt donned and L half lap tray in place.  Nurse notified that pt wanting to return to bed.   Therapy Documentation Precautions:  Precautions Precautions: Fall Precaution Comments: R hemiplegia, R gaze preference Restrictions Weight Bearing Restrictions: No   Vital Signs: Therapy Vitals Temp: 98 F (36.7 C) Temp Source: Oral Pulse Rate: 68 Resp: 16 BP: 137/81 mmHg Patient Position (if appropriate): Sitting Oxygen Therapy SpO2: 94 % O2 Device: Not Delivered Pain: Pt with no c/o pain during session.    Locomotion : Ambulation Ambulation/Gait Assistance: 1: +2 Total assist   See FIM for current functional status  Therapy/Group: Individual Therapy  [name] 07/04/2014, 3:18 PM

## 2014-07-05 ENCOUNTER — Ambulatory Visit (HOSPITAL_COMMUNITY): Payer: Medicare Other | Admitting: Rehabilitation

## 2014-07-05 ENCOUNTER — Inpatient Hospital Stay (HOSPITAL_COMMUNITY): Payer: Medicare Other | Admitting: Occupational Therapy

## 2014-07-05 DIAGNOSIS — I69391 Dysphagia following cerebral infarction: Secondary | ICD-10-CM

## 2014-07-05 DIAGNOSIS — I69322 Dysarthria following cerebral infarction: Secondary | ICD-10-CM

## 2014-07-05 DIAGNOSIS — G8190 Hemiplegia, unspecified affecting unspecified side: Secondary | ICD-10-CM

## 2014-07-05 DIAGNOSIS — G8191 Hemiplegia, unspecified affecting right dominant side: Secondary | ICD-10-CM

## 2014-07-05 MED ORDER — CETYLPYRIDINIUM CHLORIDE 0.05 % MT LIQD
7.0000 mL | Freq: Four times a day (QID) | OROMUCOSAL | Status: DC
  Administered 2014-07-05 – 2014-08-01 (×89): 7 mL via OROMUCOSAL

## 2014-07-05 NOTE — Progress Notes (Signed)
Social Work Elease Hashimoto, LCSW Social Worker Signed  Patient Care Conference 07/04/2014  2:53 PM    Expand All Collapse All   Inpatient RehabilitationTeam Conference and Plan of Care Update Date: 07/04/2014   Time: 11;45 AM     Patient Name: KENZEL RUESCH       Medical Record Number: [id number]  Date of Birth: [birth year] Sex: Male         Room/Bed: 4W18C/4W18C-01 Payor Info: Payor: MEDICARE / Plan: MEDICARE PART A AND B / Product Type: *No Product type* /    Admitting Diagnosis: CVA  Admit Date/Time:  07/02/2014  5:47 PM Admission Comments: No comment available   Primary Diagnosis:  Left pontine stroke Principal Problem: Left pontine stroke    Patient Active Problem List     Diagnosis  Date Noted   .  Right hemiparesis  07/05/2014   .  Dysphagia, post-stroke  07/05/2014   .  Dysarthria due to recent cerebral infarction  07/05/2014   .  Left pontine stroke  07/02/2014   .  Embolic stroke involving left carotid artery  06/28/2014   .  Stroke  06/28/2014   .  CAP (community acquired pneumonia)  06/28/2014   .  Normocytic anemia  06/28/2014   .  Essential hypertension  06/28/2014   .  Hypokalemia  06/28/2014     Expected Discharge Date: Expected Discharge Date: 07/30/14  Team Members Present: Physician leading conference: Dr. Alysia Penna Nurse Present: Rayetta Pigg, RN PT Present: Carney Living, Jimmie Molly, PT OT Present: Clyda Greener, Rhetta Mura, OT SLP Present: Gunnar Fusi, SLP PPS Coordinator present : Daiva Nakayama, RN, CRRN        Current Status/Progress  Goal  Weekly Team Focus   Medical     right flaccid hemiparesis, PNA resolving, diarrhea from abx  maintain med stability  initiate rehab program   Bowel/Bladder     Incontinent of bowel and bladder. LBM 07/03/14 x3  Mod assist   Timed toileting q2-3 hours   Swallow/Nutrition/ Hydration     Dys. 1 textures and nectar-thiclks liquids with Max assist    least restrictive PO intake with Min  assist   increase endurance and PO intake    ADL's     Max A for bed mobility, +2 for STS for clothing management, max A squat pivot transfers, Mod  - total A for self care tasks   Min A overall  Pt education, attention to R, functional transfers/mobility, pt and family education, safety awareness, use of R UE as stabilitzer.    Mobility     max A for bed mobility, max A squat pivot transfers, +2 for standing, gait and stairs not performed yet due to safety.    Min A overall  R NMR, attention to the R, w/c mobility, bed mobility, transfers, gait, pt/family education   Communication     Max assist   Min assist   increase breath support and overall endurance    Safety/Cognition/ Behavioral Observations    Mod assist   Supervision   increase endurance and basic problem solving    Pain     Denies pain  <2 on 0-10 pain scale  Assess pain q4hr, medicate as needed    Skin     Scab to scalp from melanoma removal, OTA; redness and yeast to scrotum, MGP applied; penis red and tender; lips dry and peeling, vaseline applied  No new skin breakdown while on rehab   Assess skin  qshift      *See Care Plan and progress notes for long and short-term goals.    Barriers to Discharge:  elderly wife who uses cane     Possible Resolutions to Barriers:   ? if daughter can assist post d/c     Discharge Planning/Teaching Needs:   Based upon goals and pt's progress-wife can only provide supervision level, discussing hired assist and/or NHP        Team Discussion:    Goals-min level-inattention to right and flaccid.  Wants to go home and will hire assistance.  Concerned about his hiccups-MD addressing. Pleased with progress since yesterday.   Revisions to Treatment Plan:    None    Continued Need for Acute Rehabilitation Level of Care: The patient requires daily medical management by a physician with specialized training in physical medicine and rehabilitation for the following conditions: Daily direction  of a multidisciplinary physical rehabilitation program to ensure safe treatment while eliciting the highest outcome that is of practical value to the patient.: Yes Daily medical management of patient stability for increased activity during participation in an intensive rehabilitation regime.: Yes Daily analysis of laboratory values and/or radiology reports with any subsequent need for medication adjustment of medical intervention for : Neurological problems  Elease Hashimoto 07/05/2014, 8:38 AM                  Patient ID: Lorrene Reid, male   DOB: [birth year], 80 y.o.   MRN: [id number]

## 2014-07-05 NOTE — Progress Notes (Signed)
Occupational Therapy Session Note  Patient Details  Name: [name] MRN: [id number] Date of Birth: May 08, 1926  Today's Date: 07/05/2014 OT Individual Time: 0930-1000 OT Individual Time Calculation (min): 30 min   OT Co-Treatment Time: 1000-1015 (30 min co-treat with SLP 10:00-10:30) OT Co-Treatment Time Calculation (min): 15 min   Short Term Goals: Week 1:  OT Short Term Goal 1 (Week 1): Pt will complete toilet transfer with max assist +1 OT Short Term Goal 2 (Week 1): Pt will complete LB dressing with max assist +1 OT Short Term Goal 3 (Week 1): Pt will complete UB dressing with max assist  OT Short Term Goal 4 (Week 1): Pt will sit unsupported during functional task for 2 min with min assist   Skilled Therapeutic Interventions/Progress Updates:  1:1 with OT - Patient resting in w/c upon arrival with RN providing medication and wife present.  Provided verbal and demonstration cues during medication administration by RN to manage anterior spillage on right side of his mouth by cueing him to sweep with tongue, gather and swallow and demo cues to wipe the right corner of his mouth.  Engaged in sit><stands with focus on initiating and maintaining hip flexion and anterior weight shifts in sitting to prepare for transitional movements of sit><stand.  Midline orientation during transition and once in stand/squat, postural control, standing tolerance, and lateral weight shifts to left.  Patient having difficulty shifting weight to left upon command and sometimes Reviewed Pusher Syndrome and printed handout to provide further information. Co-Treat with SLP:  Focused on unsupported sitting balance, postural control, sitting tolerance, RUE weight bearing, visual attention and scanning to right while SLP conducted her session.  Please refer to SLP progress not for details.   Therapy Documentation Precautions:  Precautions Precautions: Fall Precaution Comments: R hemiplegia, R gaze  preference Restrictions Weight Bearing Restrictions: No Pain: No report of pain  See FIM for current functional status  Therapy/Group: Individual Therapy and Co-Treatment  [name] 07/05/2014, 9:26 AM

## 2014-07-05 NOTE — Progress Notes (Signed)
Subjective/Complaints: 80 y.o. left handed male with history of HTN, prostate cancer, recent PNA with malaise X 1 week who was admitted on 06/27/14 with right sided weakness with right gaze preference and difficulty talking. Patient was noted to be in A fib in setting of CAP and CT head without acute changes. EKG evaluated by cardiology and felt to have multiple artifacts with 1 degree AVB and repeat EKG with NSR. NIHSS- 14. 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 2D echo with EF 55-60% with mild focal basal atrophy and grade 1 diastolic dysfunction. Carotid dopplers without significant stenosis. 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 Speech/language evaluation revals moderate to severe dysarthria with perseveration, echolalia, impaired cognition as well as difficulty following one step commands  No issues overnite No cough or SOB Objective: Vital Signs: Blood pressure 151/62, pulse 60, temperature 98.4 F (36.9 C), temperature source Oral, resp. rate 17, height _0  (1.753 m), weight 66.2 kg (145 lb 15.1 oz), SpO2 95 %. No results found. Results for orders placed or performed during the hospital encounter of 07/02/14 (from the past 72 hour(s))  Urinalysis, Routine w reflex microscopic     Status: None   Collection Time: 07/02/14  7:02 PM  Result Value Ref Range   Color, Urine YELLOW YELLOW   APPearance CLEAR CLEAR   Specific Gravity, Urine 1.024 1.005 - 1.030   pH 5.5 5.0 - 8.0   Glucose, UA NEGATIVE NEGATIVE mg/dL   Hgb urine dipstick NEGATIVE NEGATIVE   Bilirubin Urine NEGATIVE NEGATIVE   Ketones, ur NEGATIVE NEGATIVE mg/dL   Protein, ur NEGATIVE NEGATIVE mg/dL   Urobilinogen, UA 1.0 0.0 - 1.0 mg/dL   Nitrite  NEGATIVE NEGATIVE   Leukocytes, UA NEGATIVE NEGATIVE    Comment: MICROSCOPIC NOT DONE ON URINES WITH NEGATIVE PROTEIN, BLOOD, LEUKOCYTES, NITRITE, OR GLUCOSE <1000 mg/dL.  Urine culture     Status: None   Collection Time: 07/02/14  7:02 PM  Result Value Ref Range   Specimen Description URINE, RANDOM    Special Requests NONE    Culture  Setup Time      07/03/2014 03:55 Performed at [facility] Performed at [facility]     Culture NO GROWTH Performed at [facility]     Report Status 07/03/2014 FINAL   CBC WITH DIFFERENTIAL     Status: Abnormal   Collection Time: 07/03/14  3:49 AM  Result Value Ref Range   WBC 17.0 (H) 4.0 - 10.5 K/uL   RBC 4.20 (L) 4.22 - 5.81 MIL/uL   Hemoglobin 11.9 (L) 13.0 - 17.0 g/dL   HCT 37.3 (L) 39.0 - 52.0 %   MCV 88.8 78.0 - 100.0 fL   MCH 28.3 26.0 - 34.0 pg   MCHC 31.9 30.0 - 36.0 g/dL   RDW 13.1 11.5 - 15.5 %   Platelets 341 150 - 400 K/uL   Neutrophils Relative % 85 (H) 43 - 77 %   Neutro Abs 14.3 (H) 1.7 - 7.7 K/uL   Lymphocytes Relative 8 (L) 12 - 46 %   Lymphs Abs 1.3 0.7 - 4.0 K/uL   Monocytes Relative 6 3 - 12 %   Monocytes Absolute 1.1 (H) 0.1 - 1.0 K/uL   Eosinophils  Relative 1 0 - 5 %   Eosinophils Absolute 0.2 0.0 - 0.7 K/uL   Basophils Relative 0 0 - 1 %   Basophils Absolute 0.1 0.0 - 0.1 K/uL  Comprehensive metabolic panel     Status: Abnormal   Collection Time: 07/03/14  3:49 AM  Result Value Ref Range   Sodium 142 137 - 147 mEq/L   Potassium 4.1 3.7 - 5.3 mEq/L   Chloride 109 96 - 112 mEq/L   CO2 22 19 - 32 mEq/L   Glucose, Bld 124 (H) 70 - 99 mg/dL   BUN 25 (H) 6 - 23 mg/dL   Creatinine, Ser 0.67 0.50 - 1.35 mg/dL   Calcium 9.0 8.4 - 10.5 mg/dL   Total Protein 5.7 (L) 6.0 - 8.3 g/dL   Albumin 1.9 (L) 3.5 - 5.2 g/dL   AST 27 0 - 37 U/L   ALT 23 0 - 53 U/L   Alkaline Phosphatase 108 39 - 117 U/L   Total Bilirubin 0.3 0.3 - 1.2 mg/dL   GFR calc non Af Amer 85 (L) >90  mL/min   GFR calc Af Amer >90 >90 mL/min    Comment: (NOTE) The eGFR has been calculated using the CKD EPI equation. This calculation has not been validated in all clinical situations. eGFR's persistently <90 mL/min signify possible Chronic Kidney Disease.    Anion gap 11 5 - 15  Clostridium Difficile by PCR     Status: None   Collection Time: 07/03/14  4:10 PM  Result Value Ref Range   C difficile by pcr NEGATIVE NEGATIVE     HEENT: normal Cardio: RRR Resp: CTA B/L GI: BS positive and NT,ND Extremity:  Edema Right hand and Right foot Skin:   Wound vertex of scalp, eschar Neuro: Lethargic, Cranial Nerve Abnormalities Right central 7, Abnormal Sensory reduced on left side to LT, Abnormal Motor 0/5 on right side and Tone:  Hypotonia Musc/Skel:  Other no pain with Right shoulder ROM Gen NAD   Assessment/Plan: 1. Functional deficits secondary to left paramedian pontine infarct which require 3+ hours per day of interdisciplinary therapy in a comprehensive inpatient rehab setting. Physiatrist is providing close team supervision and 24 hour management of active medical problems listed below. Physiatrist and rehab team continue to assess barriers to discharge/monitor patient progress toward functional and medical goals. Marland KitchenFIM: FIM - Bathing Bathing Steps Patient Completed: Chest, Right Arm, Abdomen, Right upper leg, Left upper leg Bathing: 1: Two helpers  FIM - Upper Body Dressing/Undressing Upper body dressing/undressing steps patient completed: Put head through opening of pull over shirt/dress Upper body dressing/undressing: 2: Max-Patient completed 25-49% of tasks FIM - Lower Body Dressing/Undressing Lower body dressing/undressing: 1: Two helpers     FIM - Radio producer Devices: Recruitment consultant Transfers: 1-From toilet/BSC: Total A (helper does all/Pt. < 25%)  FIM - Bed/Chair Transfer Bed/Chair Transfer Assistive Devices: Arm  rests Bed/Chair Transfer: 2: Supine > Sit: Max A (lifting assist/Pt. 25-49%), 1: Two helpers  FIM - Locomotion: Wheelchair Distance: 125' Locomotion: Wheelchair: 2: Travels 50 - 149 ft with maximal assistance (Pt: 25 - 49%) FIM - Locomotion: Ambulation Locomotion: Ambulation Assistive Devices: Other (comment) (L handrail in hallway) Ambulation/Gait Assistance: 1: +2 Total assist Locomotion: Ambulation: 1: Two helpers  Comprehension Comprehension Mode: Auditory Comprehension: 4-Understands basic 75 - 89% of the time/requires cueing 10 - 24% of the time  Expression Expression Mode: Verbal Expression: 2-Expresses basic 25 - 49% of the time/requires cueing 50 -  75% of the time. Uses single words/gestures.  Social Interaction Social Interaction: 4-Interacts appropriately 75 - 89% of the time - Needs redirection for appropriate language or to initiate interaction.  Problem Solving Problem Solving: 2-Solves basic 25 - 49% of the time - needs direction more than half the time to initiate, plan or complete simple activities  Memory Memory: 3-Recognizes or recalls 50 - 74% of the time/requires cueing 25 - 49% of the time   Medical Problem List and Plan: 1. Functional deficits secondary to left paramedian pontine infarct 2. DVT Prophylaxis/Anticoagulation: Pharmaceutical: Lovenox 3. Pain Management: N/A 4. Mood: LCSW to follow for evaluation and support.  5. Neuropsych: This patient is not capable of making decisions on his own behalf. 6. Skin/Wound Care:hx melanoma removal scalp, resume vaseline, family to bring in name of cream from dermatologist Routine pressure relief measures. Turn patient every 2 hours when in bed.  7. Fluids/Electrolytes/Nutrition: Monitor I/O. Check daily weights to monitor for signs of overload.  8. CAP: Will check follow CXR to monitor for stability/ fluid overload as continues to have difficulty handling secretions as well as cough. Antibiotic Day #  5 9. Acute renal insufficiency: Improved. Off HCTZ at this time. 10. HTN: Will monitor every 8 hours and allow permissive HTN for adequate perfusion. 11.  leucocytosis: secondary to PNA . Follow temp curve for now.  12. Thrush: Will treat with short course of diflucan. Not complaining of mouth pain 13.  Diarrhea- C diff negative, antibiotic associated LOS (Days) 3 A FACE TO FACE EVALUATION WAS PERFORMED  Julina Altmann E 07/05/2014, 7:22 AM

## 2014-07-05 NOTE — Plan of Care (Signed)
Problem: RH BOWEL ELIMINATION Goal: RH STG MANAGE BOWEL WITH ASSISTANCE STG Manage Bowel with Min Assistance.  Outcome: Progressing Goal: RH STG MANAGE BOWEL W/MEDICATION W/ASSISTANCE STG Manage Bowel with Medication with [place].  Outcome: Progressing  Problem: RH BLADDER ELIMINATION Goal: RH STG MANAGE BLADDER WITH ASSISTANCE STG Manage Bladder With Min Assistance  Outcome: Progressing  Problem: RH SKIN INTEGRITY Goal: RH STG SKIN FREE OF INFECTION/BREAKDOWN No new breakdown while on rehab with mod assit of caregiver, total assist of caregiver for skin care to buttocks  Outcome: Progressing Goal: RH STG MAINTAIN SKIN INTEGRITY WITH ASSISTANCE STG Maintain Skin Integrity With Max Assistance.  Outcome: Progressing Goal: RH STG ABLE TO PERFORM INCISION/WOUND CARE W/ASSISTANCE STG Able To Perform Incision/Wound Care With Total Assistance for buttocks and heel care  Outcome: Progressing  Problem: RH SAFETY Goal: RH STG ADHERE TO SAFETY PRECAUTIONS W/ASSISTANCE/DEVICE STG Adhere to Safety Precautions With Supervision Assistance/Device.  Outcome: Progressing  Problem: RH PAIN MANAGEMENT Goal: RH STG PAIN MANAGED AT OR BELOW PT'S PAIN GOAL 3 or less on scale of 1-10  Outcome: Progressing No complaints of pain  Problem: RH KNOWLEDGE DEFICIT Goal: RH STG INCREASE KNOWLEDGE OF HYPERTENSION Patient/caregiver will be able to independently verbalize strategies to manage high blood pressure at home  Outcome: Progressing Goal: RH STG INCREASE KNOWLEDGE OF DYSPHAGIA/FLUID INTAKE Patient/caregiver will be able to independently verbalize strategies to manage dysphagia at home  Outcome: Progressing

## 2014-07-05 NOTE — IPOC Note (Signed)
Overall Plan of Care [facility]) Patient Details Name: [name] MRN: [id number] DOB: 1925-11-15  Admitting Diagnosis: CVA  Hospital Problems: Principal Problem:   Left pontine stroke Active Problems:   CAP (community acquired pneumonia)   Essential hypertension   Right hemiparesis   Dysphagia, post-stroke   Dysarthria due to recent cerebral infarction     Functional Problem List: Nursing Bladder, Bowel, Endurance, Edema, Medication Management, Nutrition, Pain, Safety, Sensory, Skin Integrity  PT Balance, Endurance, Motor, Perception, Safety, Sensory, Skin Integrity, Other (comment), Edema (strength)  OT Balance, Pain, Perception, Cognition, Safety, Sensory, Endurance, Motor, Vision  SLP Cognition, Linguistic, Nutrition  TR Endurance, Motor, Safety, Skin Integrity       Basic ADL's: OT Grooming, Bathing, Dressing, Toileting, Eating     Advanced  ADL's: OT       Transfers: PT Bed to Chair, Car, Furniture, [employer], Metallurgist: PT Ambulation, Emergency planning/management officer, Stairs     Additional Impairments: OT Fuctional Use of Upper Extremity (RUE (non-dominant))  SLP Swallowing, Communication, Social Cognition expression Problem Solving, Memory  TR      Anticipated Outcomes Item Anticipated Outcome  Self Feeding setup/supervision  Swallowing  Min assist    Basic self-care  min assist overall  Toileting  min assist   Bathroom Transfers min assist  Bowel/Bladder  continent of bowel and bladder with toileting modified independence  Transfers  Min A overall  Locomotion  Supervision-min A overall  Communication  Min assist   Cognition  Supervision with basic  Pain  3 or less on scale of 1-10  Safety/Judgment  supervision   Therapy Plan: PT Intensity: Minimum of 1-2 x/day ,45 to 90 minutes PT Frequency: 5 out of 7 days PT Duration Estimated Length of Stay: 21-28 days OT Intensity: Minimum of 1-2 x/day, 45 to 90 minutes OT  Frequency: 5 out of 7 days OT Duration/Estimated Length of Stay: 21-28 days SLP Intensity: Minumum of 1-2 x/day, 30 to 90 minutes SLP Frequency: 5 out of 7 days SLP Duration/Estimated Length of Stay: 21-24 days       Team Interventions: Nursing Interventions Patient/Family Education, Bladder Management, Bowel Management, Disease Management/Prevention, Pain Management, Medication Management, Skin Care/Wound Management, Dysphagia/Aspiration Precaution Training, Discharge Planning, Psychosocial Support  PT interventions Ambulation/gait training, Community reintegration, DME/adaptive equipment instruction, Neuromuscular re-education, Psychosocial support, Stair training, UE/LE Strength taining/ROM, Wheelchair propulsion/positioning, UE/LE Coordination activities, Therapeutic Activities, Skin care/wound management, Pain management, Discharge planning, Training and development officer, Functional electrical stimulation, Cognitive remediation/compensation, Disease management/prevention, Functional mobility training, Patient/family education, Splinting/orthotics, Therapeutic Exercise, Visual/perceptual remediation/compensation  OT Interventions Training and development officer, Cognitive remediation/compensation, Community reintegration, Discharge planning, DME/adaptive equipment instruction, Functional electrical stimulation, Functional mobility training, Neuromuscular re-education, Psychosocial support, Patient/family education, Self Care/advanced ADL retraining, Therapeutic Activities, Therapeutic Exercise, Splinting/orthotics, UE/LE Strength taining/ROM, Visual/perceptual remediation/compensation, UE/LE Coordination activities, Wheelchair propulsion/positioning  SLP Interventions Cognitive remediation/compensation, Cueing hierarchy, Dysphagia/aspiration precaution training, Environmental controls, Functional tasks, Internal/external aids, Oral motor exercises, Patient/family education, Speech/Language facilitation,  Therapeutic Activities  TR Interventions Adaptive equipment instruction, 1:1 session, Training and development officer, Functional mobility training, Community reintegration, Cognitive remediation/compensation, Barrister's clerk education, Therapeutic activities, Recreation/leisure participation, Provide activity resources in room, Therapeutic exercise, UE/LE Coordination activities, Visual/perceptual remediation/compensation, Wheelchair propulsion/positioning  SW/CM Interventions Discharge Planning, Barrister's clerk, Patient/Family Education    Team Discharge Planning: Destination: PT-Home ,OT- Home , SLP-Home Projected Follow-up: PT-Home health PT, OT-  Home health OT, SLP-Home Health SLP, 24 hour supervision/assistance Projected Equipment Needs: PT-To be determined, OT- To be determined, SLP-To be determined  Equipment Details: PT- , OT-  Patient/family involved in discharge planning: PT- Patient, Family member/caregiver,  OT-Patient, Family member/caregiver, SLP-Patient, Family member/caregiver  MD ELOS: 22-28 days         Medical Rehab Prognosis:  Good Assessment: 80 y.o. left handed male with history of HTN, prostate cancer, recent PNA with malaise X 1 week who was admitted on 06/27/14 with right sided weakness with right gaze preference and difficulty talking. Patient was noted to be in A fib in setting of CAP and CT head without acute changes. EKG evaluated by cardiology and felt to have multiple artifacts with 1 degree AVB and repeat EKG with NSR.  NIHSS- 14. 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 2D echo with EF 55-60% with mild focal basal atrophy and grade 1 diastolic dysfunction. Carotid dopplers without significant stenosis.   Now requiring 24/7 Rehab RN,MD, as well as CIR level PT, OT  and SLP.  Treatment team will focus on ADLs and mobility with goals set at min A   See Team Conference Notes for weekly updates to the plan of care

## 2014-07-05 NOTE — Progress Notes (Signed)
Speech Language Pathology Daily Session Note  Patient Details  Name: [name] MRN: [id number] Date of Birth: February 20, 1926  Today's Date: 07/05/2014 SLP Co-tx Time: 1015-1030 (1000-1030 total time) SLP Co-txTime Calculation (min): 30 minSLP Individual Time: 1030-1100 SLP Individual Time Calculation (min): 30 min  SLP Co-tx Time: 1015-1030 (1000-1030 total time) SLP Co-txTime Calculation (min): 30 min  Short Term Goals: Week 1: SLP Short Term Goal 1 (Week 1): Patient will consume Dys.1 textures and nectar-thick liquids via cup with Min verbal cues to utilize safe swallow strategies to minimize overt s/s of aspiration. SLP Short Term Goal 2 (Week 1): Patient will produce intelligible speech at the phrase level with Mod multimodal cues to utilize compensatory strategies. SLP Short Term Goal 3 (Week 1): Patient will initiate basic self-care tasks with no more than 2 verbal cues.   SLP Short Term Goal 4 (Week 1): Patient will request help as needed with Mod question cues.  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  See OT note for details related to specific goals and interventions.  Pt recalled at least 2 dysarthria strategies from previous therapy session with min-mod question cues.  Pt then utilized strategies during functional conversations with the SLP, OT, and pt's wife with mod-max assist multimodal cuing.  Pt was able to achieve improved breath support and vocal intensity for speech with assistance from OT (versus when seated in wheelchair without additional postural support) to achieve and maintain upright sitting posture.  Pt was also able to utilize dysarthria strategies with mod-max multimodal cues to teach back at least 2 characteristics of Pusher Syndrome  during skilled education with SLP/OT for ~60% intelligibility.     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  Pt demonstrated good intellectual awareness of physical impairments, but required mod assist instructional cues for emergent awareness to identify and generate a solution from resulting problems (i.e. Arm getting caught in between the wheelchair and the bed) in the moment.  Pt became increasingly lethargic towards the end of today's session and, as a result, was significantly more dysarthric and less responsive to cuing for use of compensatory strategies.  Pt returned to room by SLP and handed off to nurse tech x2 to transfer back to bed.  Continue per current plan of care.   FIM:  Comprehension Comprehension Mode: Auditory Comprehension: 4-Understands basic 75 - 89% of the time/requires cueing 10 - 24% of the time Expression Expression Mode: Verbal Expression: 3-Expresses basic 50 - 74% of the time/requires cueing 25 - 50% of the time. Needs to repeat parts of sentences. Social Interaction Social Interaction: 4-Interacts appropriately 75 - 89% of the time - Needs redirection for appropriate language or to initiate interaction. Problem Solving Problem Solving: 2-Solves basic 25 - 49% of the time - needs direction more than half the time to initiate, plan or complete simple activities  Pain Pain Assessment Pain Assessment: No/denies pain  Therapy/Group: Individual Therapy   Windell Moulding, M.A. CCC-SLP  Astella Desir, Elmyra Ricks L 07/05/2014, 1:25 PM

## 2014-07-05 NOTE — Progress Notes (Signed)
Occupational Therapy Session Note  Patient Details  Name: [name] MRN: [id number] Date of Birth: 07/07/1926  Today's Date: 07/05/2014 OT Individual Time: 3536-1443 and 1401-1501 OT Individual Time Calculation (min): 60 min and 60 min   Short Term Goals: Week 1:  OT Short Term Goal 1 (Week 1): Pt will complete toilet transfer with max assist +1 OT Short Term Goal 2 (Week 1): Pt will complete LB dressing with max assist +1 OT Short Term Goal 3 (Week 1): Pt will complete UB dressing with max assist  OT Short Term Goal 4 (Week 1): Pt will sit unsupported during functional task for 2 min with min assist   Skilled Therapeutic Interventions/Progress Updates:  Session 1: Upon entering the room, pt supine in bed with no c/o pain. OT session with focus on self care, pt education, functional transfer, forward leaning, and safety awareness. Pt incontinent of bowel while in bed prior to therapist arrival. OT assisting in clean up and change of diaper. RN notified in order to check skin integrity as well with barrier cream placed on buttocks.  Pt required max A for supine >sit. Transferred with Mod A of 1 towards L side with 2nd helper for safety. Pt engaged in bathing and dressing seated in wheelchair at sink side with focus on hemiplegic dressing techniques. Feet placed on small stool in order to patient to reach lower portion of LE. LB dressing requiring total A with one person standing with pt and second helper to perform clothing management. Forward leaning encouraged throughout session in order to be successful with tasks and to teach "nose over toes". Pt seated in wheelchair with QRB donned and all other items within reach upon exiting the room.   Session 2:Upon entering the room, pt supine in bed with no c/o pain this session. OT session with focus on hemiplegic dressing techniques, pt/family education, functional transfers, and safety awareness. Patient's wheelchair back was changed to Acta-  Back and modified to facilitate proper chair positioning.Bed mobility with Max A and max verbal cues for rolling L to R to remove elastic waist pants down hips to use urinal with pt unable to void. Supine > sit with Max A and squat pivot to wheelchair with Mod A. Once seated in chair pt and family member educated on and practiced hemiplegic one handed UB dressing technique. Pt able to doff pull over shirt with Min A to pull over head and Mod A to donn. Pt later able to verbalize steps with increased time of how to remove shirt. OT discussed safety awareness for R UE during transfers and while seated in wheelchair. Pt requiring max verbal cues throughout session to "find arm". Pt requiring increased time and verbal cues to speech slowly and loudly and swallow in order to better understand speech this session. Pt remained seated in wheelchair with QRB donned, R arm tray donned, and leg rest donned as well.  Therapy Documentation Precautions:  Precautions Precautions: Fall Precaution Comments: R hemiplegia, R gaze preference Restrictions Weight Bearing Restrictions: No Pain: Pain Assessment Pain Assessment: No/denies pain  See FIM for current functional status  Therapy/Group: Individual Therapy  [name] 07/05/2014, 3:31 PM

## 2014-07-06 ENCOUNTER — Inpatient Hospital Stay (HOSPITAL_COMMUNITY): Payer: Medicare Other | Admitting: Speech Pathology

## 2014-07-06 ENCOUNTER — Inpatient Hospital Stay (HOSPITAL_COMMUNITY): Payer: Medicare Other | Admitting: Occupational Therapy

## 2014-07-06 ENCOUNTER — Ambulatory Visit (HOSPITAL_COMMUNITY): Payer: Medicare Other | Admitting: Rehabilitation

## 2014-07-06 DIAGNOSIS — I69391 Dysphagia following cerebral infarction: Secondary | ICD-10-CM

## 2014-07-06 DIAGNOSIS — I69322 Dysarthria following cerebral infarction: Secondary | ICD-10-CM

## 2014-07-06 LAB — BASIC METABOLIC PANEL
ANION GAP: 12 (ref 5–15)
BUN: 20 mg/dL (ref 6–23)
CO2: 22 mEq/L (ref 19–32)
Calcium: 8.2 mg/dL — ABNORMAL LOW (ref 8.4–10.5)
Chloride: 103 mEq/L (ref 96–112)
Creatinine, Ser: 0.67 mg/dL (ref 0.50–1.35)
GFR, EST NON AFRICAN AMERICAN: 85 mL/min — AB (ref >90)
Glucose, Bld: 91 mg/dL (ref 70–99)
POTASSIUM: 3.9 meq/L (ref 3.7–5.3)
SODIUM: 137 meq/L (ref 137–147)

## 2014-07-06 NOTE — Progress Notes (Signed)
Physical Therapy Session Note  Patient Details  Name: [name] MRN: [id number] Date of Birth: 08-09-1925  Today's Date: 07/06/2014 PT Co-Treatment Time: 6759 (whole session 1500-1600 w/ OT)-1600 PT Co-Treatment Time Calculation (min): 30 min  Short Term Goals: Week 1:  PT Short Term Goal 1 (Week 1): Pt to perform bed mobility with mod Ax1person PT Short Term Goal 2 (Week 1): Pt to perform bed<>w/c transfer with max Ax1person PT Short Term Goal 3 (Week 1): Pt to maintain dynamic sitting balance with mod Ax1person PT Short Term Goal 4 (Week 1): Pt to propel w/c 24' with mod Ax1person PT Short Term Goal 5 (Week 1): Pt to ambulate 10' with LRAD and Ax2persons  Skilled Therapeutic Interventions/Progress Updates:   Pt received sitting in w/c in room, agreeable to therapy session.  Wife present during session to observe.  Skilled co-treat with OT to focus on functional transfers, sitting balance, postural control, WB through RUE, trunk shortening lengthening, forward weight shifts, WB through BLEs and increased upright posture.  Also spent approx 7-8 mins on stretching while lying on wedge for pectoral lengthening, cervical extension, hip flex stretch, manual hamstring and glute stretch while OT provided manual techniques to R hand for decreased swelling.  Performed bouts of slouching to upright posture with facilitation at hips, trunk, and head for upright posture.  Then progressed to performing reaching task to the L to decrease pushing, facilitate increased trunk shortening on R (OT assisting) while PT assisted with pt reaching and placing clothes pins to the R for trunk rotation and WB through extended R arm then to R elbow.  Ended session with forward weight shifts to retreive clothes pins enough for buttocks to clear from mat.  Cues and facilitation for maintaining upright head and chest with focus on hip flex and reaching slightly to the L to decrease pushing and for equal WB through LEs.  Pt  tolerated well.  Performed squat pivot transfer x 3 reps via bobath method for increased forward weight shift and WB through LEs.  Performed at max A (+2 for safety) with cues for holding RUE with LUE to decrease pushing and to maintain forward trunk lean for improved buttock clearance.  Pt returned to bed in same manner.   Left in bed with bed alarm set and 3 bed rails up with all needs in reach.    Therapy Documentation Precautions:  Precautions Precautions: Fall Precaution Comments: R hemiplegia, R gaze preference Restrictions Weight Bearing Restrictions: No   Vital Signs: Therapy Vitals Temp: 97.6 F (36.4 C) Temp Source: Oral Pulse Rate: 70 Resp: 18 BP: 130/70 mmHg Patient Position (if appropriate): Sitting Oxygen Therapy SpO2: 94 % O2 Device: Not Delivered Pain: Pt with pain in L bicep during stretching, modified to decrease pain.   See FIM for current functional status  Therapy/Group: Co-Treatment  [name] 07/06/2014, 4:31 PM

## 2014-07-06 NOTE — Progress Notes (Signed)
INITIAL NUTRITION ASSESSMENT  DOCUMENTATION CODES Per approved criteria  -Not Applicable   INTERVENTION: Provide Magic cup TID between meals, each supplement provides 290 kcal and 9 grams of protein. Ordered.  Encourage adequate PO intake.   NUTRITION DIAGNOSIS: Increased nutrient needs related to chronic illness as evidenced by estimated nutrition needs.   Goal: Pt to meet >/= 90% of their estimated nutrition needs   Monitor:  PO intake, weight trends, labs, I/O's  Reason for Assessment: Low Braden Score  80 y.o. male  Admitting Dx: Left pontine stroke  ASSESSMENT: Pt with history of HTN, prostate cancer, recent PNA with malaise X 1 week who was admitted on 06/27/14 with right sided weakness with right gaze preference and difficulty talking. Patient was noted to be in A fib in setting of CAP. EKG evaluated by cardiology and felt to have multiple artifacts with 1 degree AVB and repeat EKG with NSR.   Pt reports his appetite is good currently and PTA with no other difficulties. Weight has been stable with his usual body weight of 150 lbs. Meal completion has been varied from 0-75% however. Wife reports pt enjoys Magic cup and would like it ordered for in between meals. RD to order. Ensure pudding was additionally offered, however pt declined saying he may like it at another time. Pt was encouraged to eat his food at meals.   Labs reviewed.   Height: Ht Readings from Last 1 Encounters:  07/03/14 5\' 9"  (1.753 m)    Weight: Wt Readings from Last 1 Encounters:  07/06/14 154 lb 12.2 oz (70.2 kg)    Ideal Body Weight: 160 lbs  % Ideal Body Weight: 96%  Wt Readings from Last 10 Encounters:  07/06/14 154 lb 12.2 oz (70.2 kg)  06/28/14 150 lb (68.04 kg)    Usual Body Weight: 150 lbs  % Usual Body Weight: 103%  BMI:  Body mass index is 22.84 kg/(m^2).  Estimated Nutritional Needs: Kcal: 5573-2202 Protein: 80-90 grams Fluid: 1.75- 1.95 L/day  Skin: intact  Diet  Order: DIET - DYS 1 with nectar thick liquids  EDUCATION NEEDS: -No education needs identified at this time   Intake/Output Summary (Last 24 hours) at 07/06/14 1022 Last data filed at 07/06/14 0900  Gross per 24 hour  Intake   1240 ml  Output    400 ml  Net    840 ml    Last BM: 12/10  Labs:   Recent Labs Lab 07/01/14 0600 07/03/14 0349 07/06/14 0633  NA 142 142 137  K 4.4 4.1 3.9  CL 108 109 103  CO2 22 22 22   BUN 24* 25* 20  CREATININE 0.72 0.67 0.67  CALCIUM 8.6 9.0 8.2*  GLUCOSE 121* 124* 91    CBG (last 3)  No results for input(s): GLUCAP in the last 72 hours.  Scheduled Meds: . amoxicillin-clavulanate  800 mg Oral Q12H  . antiseptic oral rinse  7 mL Mouth Rinse QID  . aspirin  325 mg Oral Daily  . atorvastatin  20 mg Oral q1800  . benzonatate  100 mg Oral TID  . enoxaparin (LOVENOX) injection  40 mg Subcutaneous Q24H  . feeding supplement (ENSURE)  1 Container Oral TID WC  . magic mouthwash  10 mL Oral TID  . polycarbophil  625 mg Oral BID  . saccharomyces boulardii  250 mg Oral BID  . white petrolatum   Topical BID    Continuous Infusions: . sodium chloride 0  (07/06/14 0551)  Past Medical History  Diagnosis Date  . Hypertension   . Cancer     Past Surgical History  Procedure Laterality Date  . Appendectomy    . Kidney surgery      Kallie Locks, MS, RD, LDN Pager # [phone] After hours/ weekend pager # [phone]

## 2014-07-06 NOTE — Progress Notes (Signed)
Occupational Therapy Session Note  Patient Details  Name: [name] MRN: [id number] Date of Birth: 1926-01-19  Today's Date: 07/06/2014 OT Individual Time: 1102-1202 and 1500-1530 ( Co tx with PT from 1500-1600) OT Individual Time Calculation (min): 60 min and 30 min   Short Term Goals: Week 1:  OT Short Term Goal 1 (Week 1): Pt will complete toilet transfer with max assist +1 OT Short Term Goal 2 (Week 1): Pt will complete LB dressing with max assist +1 OT Short Term Goal 3 (Week 1): Pt will complete UB dressing with max assist  OT Short Term Goal 4 (Week 1): Pt will sit unsupported during functional task for 2 min with min assist   Skilled Therapeutic Interventions/Progress Updates:  Session 1: Upon entering the room, pt seated in wheelchair awaiting therapist. Pt with no c/o pain this session. Skilled OT session with focus on self care, showering, functional transfers, R inattention, STS, and kinesiotaping for edema management. Pt transferred to shower chair with Max A squat pivot and helper to hold chair for safety and guide bottom into chair. Pt requiring assistance to shifting hips back into chair for proper positioning onto roll in shower chair and wheelchair. Once on roll in shower chair pt engaged in bathing with Mod A in shower. Pt performed dressing seated in wheelchair with transfer as stated above. Pt required Max A with STS while second helper performed clothing management. Pt requiring verbal cues throughout session to look up, swallow, and speak loud and slowly for functional speech. Kinesiotape applied to R forearm and R hand in order to decrease edema. Pt seated in wheelchair with QRB and arm tray donned. Call bell within reach and awaiting lunch tray upon exiting the room.   Session 2: Upon entering the room, pt seated in wheelchair sleeping with wife present throughout session for observation. Pt required coaxing for participation secondary to fatigue. Skilled co treat  with PT and focus on functional transfer, weight bearing through R UE, weight shifting, functional transfers, dynamic and static sitting balance, trunk shortening and lengthening. Pt assisted via wheelchair to therapy gym and pt transferred by bobath technique in order to decrease pushing tendencies and second person required for safety. Pt reporting some discomfort once seated and wedge utilized with pt laying on it to facilitate cervical extension and pectoral stretching. OT provided PROM to digits and wrist as well as retrograde massage in an effort to decrease swelling in R hand as PT performed LE stretching. Pt engaged in seated task reaching to the L to obtain clothespin and placing on the R in order to facilitate weight shifting, trunk lengthening and shortening,and weight bearing onto extended R arm and later R elbow. Pt then progressed to forward weight shift to obtain clothes pins by coming up into squatted position. Pt requiring assist of 2 for success to facilitate upright head and chest and reaching slightly towards the L for task.  Pt assisted back to room via wheelchair. Pt transfer to wheelchair and then to bed in previous stated manner. Pt supine in bed with head slightly elevated and call bell within reach.       Therapy Documentation Precautions:  Precautions Precautions: Fall Precaution Comments: R hemiplegia, R gaze preference Restrictions Weight Bearing Restrictions: No  See FIM for current functional status  Therapy/Group: Individual Therapy and Co-Treatment  [name] 07/06/2014, 2:33 PM

## 2014-07-06 NOTE — Progress Notes (Signed)
Subjective/Complaints: 80 y.o. left handed male with history of HTN, prostate cancer, recent PNA with malaise X 1 week who was admitted on 06/27/14 with right sided weakness with right gaze preference and difficulty talking. Patient was noted to be in A fib in setting of CAP and CT head without acute changes. EKG evaluated by cardiology and felt to have multiple artifacts with 1 degree AVB and repeat EKG with NSR. NIHSS- 14. 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 2D echo with EF 55-60% with mild focal basal atrophy and grade 1 diastolic dysfunction. Carotid dopplers without significant stenosis. 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 Speech/language evaluation revals moderate to severe dysarthria with perseveration, echolalia, impaired cognition as well as difficulty following one step commands  No issues overnite No cough or SOB Review of Systems - Negative except weakness on right side Objective: Vital Signs: Blood pressure 150/52, pulse 55, temperature 97.6 F (36.4 C), temperature source Oral, resp. rate 17, height 5\' 9"  (1.753 m), weight 70.2 kg (154 lb 12.2 oz), SpO2 93 %. No results found. Results for orders placed or performed during the hospital encounter of 07/02/14 (from the past 72 hour(s))  Clostridium Difficile by PCR     Status: None   Collection Time: 07/03/14  4:10 PM  Result Value Ref Range   C difficile by pcr NEGATIVE NEGATIVE     HEENT: normal Cardio: RRR Resp: CTA B/L GI: BS positive and NT,ND Extremity:  Edema Right hand and Right foot Skin:   Wound vertex of scalp, eschar Neuro: Lethargic, Cranial Nerve Abnormalities Right central 7, Abnormal Sensory reduced on left side to LT, Abnormal Motor  0/5 on right side and Tone:  Hypotonia Musc/Skel:  Other no pain with Right shoulder ROM Gen NAD   Assessment/Plan: 1. Functional deficits secondary to left paramedian pontine infarct which require 3+ hours per day of interdisciplinary therapy in a comprehensive inpatient rehab setting. Physiatrist is providing close team supervision and 24 hour management of active medical problems listed below. Physiatrist and rehab team continue to assess barriers to discharge/monitor patient progress toward functional and medical goals. Marland KitchenFIM: FIM - Bathing Bathing Steps Patient Completed: Chest, Right Arm, Abdomen, Right upper leg, Left upper leg Bathing: 1: Two helpers  FIM - Upper Body Dressing/Undressing Upper body dressing/undressing steps patient completed: Put head through opening of pull over shirt/dress, Thread/unthread right sleeve of pullover shirt/dresss Upper body dressing/undressing: 2: Max-Patient completed 25-49% of tasks FIM - Lower Body Dressing/Undressing Lower body dressing/undressing: 1: Two helpers  FIM - Toileting Toileting: 1: Two helpers  FIM - Radio producer Devices: Recruitment consultant Transfers: 1-Two helpers (per [employer], [place])  FIM - Control and instrumentation engineer Devices: Arm rests Bed/Chair Transfer: 2: Sit > Supine: Max A (lifting assist/Pt. 25-49%), 3: Bed > Chair or W/C: Mod A (lift or lower assist)  FIM - Locomotion: Wheelchair Distance: 125' Locomotion: Wheelchair: 2: Travels 50 - 149 ft with maximal assistance (Pt: 25 - 49%) FIM - Locomotion: Ambulation Locomotion: Ambulation Assistive Devices: Other (comment) (L handrail in hallway) Ambulation/Gait Assistance: 1: +2 Total assist Locomotion: Ambulation: 1: Two helpers  Comprehension Comprehension Mode: Auditory Comprehension: 4-Understands basic 75 - 89% of the time/requires cueing 10 - 24% of the time  Expression Expression Mode:  Verbal Expression: 3-Expresses basic 50 - 74% of the time/requires cueing 25 - 50% of the time. Needs to repeat parts of sentences.  Social Interaction Social Interaction: 4-Interacts appropriately 75 - 89% of the time - Needs redirection for appropriate language or to initiate interaction.  Problem Solving Problem Solving: 2-Solves basic 25 - 49% of the time - needs direction more than half the time to initiate, plan or complete simple activities  Memory Memory: 3-Recognizes or recalls 50 - 74% of the time/requires cueing 25 - 49% of the time   Medical Problem List and Plan: 1. Functional deficits secondary to left paramedian pontine infarct 2. DVT Prophylaxis/Anticoagulation: Pharmaceutical: Lovenox 3. Pain Management: N/A 4. Mood: LCSW to follow for evaluation and support.  5. Neuropsych: This patient is not capable of making decisions on his own behalf. 6. Skin/Wound Care:hx melanoma removal scalp, resume vaseline, family to bring in name of cream from dermatologist Routine pressure relief measures. Turn patient every 2 hours when in bed.  7. Fluids/Electrolytes/Nutrition: Monitor I/O. Check daily weights to monitor for signs of overload.  8. CAP: Will check follow CXR to monitor for stability/ fluid overload as continues to have difficulty handling secretions as well as cough. Antibiotic Day # 5 9. Acute renal insufficiency: Improved. Off HCTZ at this time. 10. HTN: Will monitor every 8 hours and allow permissive HTN for adequate perfusion. 11.  leucocytosis: secondary to PNA . Follow temp curve for now.  12. Thrush: Will treat with short course of diflucan. Not complaining of mouth pain 13.  Diarrhea- C diff negative, antibiotic associated LOS (Days) 4 A FACE TO FACE EVALUATION WAS PERFORMED  KIRSTEINS,ANDREW E 07/06/2014, 7:44 AM

## 2014-07-06 NOTE — Progress Notes (Signed)
Speech Language Pathology Daily Session Note  Patient Details  Name: [name] MRN: [id number] Date of Birth: 05-07-1926  Today's Date: 07/06/2014 SLP Individual Time: 4765-4650 SLP Individual Time Calculation (min): 60 min  Short Term Goals: Week 1: SLP Short Term Goal 1 (Week 1): Patient will consume Dys.1 textures and nectar-thick liquids via cup with Min verbal cues to utilize safe swallow strategies to minimize overt s/s of aspiration. SLP Short Term Goal 2 (Week 1): Patient will produce intelligible speech at the phrase level with Mod multimodal cues to utilize compensatory strategies. SLP Short Term Goal 3 (Week 1): Patient will initiate basic self-care tasks with no more than 2 verbal cues.   SLP Short Term Goal 4 (Week 1): Patient will request help as needed with Mod question cues.  Skilled Therapeutic Interventions: Skilled treatment session focused on addressing dysphagia and dysarthria goals.  SLP facilitated session with Max faded to Mod cues to attend and manage right upper extremity during transfer from bed to chair.  SLP also facilitated session with set-up of oral care via suctioning and overall Supervision verbal cues for thoroughness. Patient's oral mucosa looks significantly improved as compared to previous session with this SLP; therefore proceeded with ice chip trials.  Patient the consumed trials of ice chips with slightly prolonged mastication, consistent cues to manage right anterior loss as well as Supervision level verbal cues to refrain from talking during PO intake.  Patient demonstrated 1 instance of a weak delayed cough in 1/7 trials.  SLP educated patient on need to continue to utilize thickener with liquids and ice chip trials are to only be consumed with SLP at this time.  Patient required Mod cues throughout session to utilize head upright posture, pacing and increased vocal intensity for increased speech intelligibility at the phrase level.  Continue with  current plan of care.     FIM:  Comprehension Comprehension Mode: Auditory Comprehension: 5-Understands basic 90% of the time/requires cueing < 10% of the time Expression Expression Mode: Verbal Expression: 3-Expresses basic 50 - 74% of the time/requires cueing 25 - 50% of the time. Needs to repeat parts of sentences. Social Interaction Social Interaction: 5-Interacts appropriately 90% of the time - Needs monitoring or encouragement for participation or interaction. Problem Solving Problem Solving: 3-Solves basic 50 - 74% of the time/requires cueing 25 - 49% of the time Memory Memory: 4-Recognizes or recalls 75 - 89% of the time/requires cueing 10 - 24% of the time FIM - Eating Eating Activity: 5: Set-up assist for open containers;5: Needs verbal cues/supervision;4: Helper occasionally scoops food on utensil  Pain Pain Assessment Pain Assessment: No/denies pain  Therapy/Group: Individual Therapy  Carmelia Roller., CCC-SLP [phone]  [place] 07/06/2014, 10:11 AM

## 2014-07-07 ENCOUNTER — Inpatient Hospital Stay (HOSPITAL_COMMUNITY): Payer: Medicare Other | Admitting: Speech Pathology

## 2014-07-07 ENCOUNTER — Inpatient Hospital Stay (HOSPITAL_COMMUNITY): Payer: Medicare Other | Admitting: Occupational Therapy

## 2014-07-07 ENCOUNTER — Inpatient Hospital Stay (HOSPITAL_COMMUNITY): Payer: Medicare Other | Admitting: Physical Therapy

## 2014-07-07 NOTE — Progress Notes (Signed)
Subjective/Complaints: 80 y.o. left handed male with history of HTN, prostate cancer, recent PNA with malaise X 1 week who was admitted on 06/27/14 with right sided weakness with right gaze preference and difficulty talking. Patient was noted to be in A fib in setting of CAP and CT head without acute changes. EKG evaluated by cardiology and felt to have multiple artifacts with 1 degree AVB and repeat EKG with NSR. NIHSS- 14. 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 2D echo with EF 55-60% with mild focal basal atrophy and grade 1 diastolic dysfunction. Carotid dopplers without significant stenosis. 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   No issues overnite + cough with breakfast , no SOB Review of Systems - Negative except weakness on right side Objective: Vital Signs: Blood pressure 143/57, pulse 61, temperature 97.9 F (36.6 C), temperature source Oral, resp. rate 18, height 5' 9"  (1.753 m), weight 66.7 kg (147 lb 0.8 oz), SpO2 94 %. No results found. Results for orders placed or performed during the hospital encounter of 07/02/14 (from the past 72 hour(s))  Basic metabolic panel     Status: Abnormal   Collection Time: 07/06/14  6:33 AM  Result Value Ref Range   Sodium 137 137 - 147 mEq/L   Potassium 3.9 3.7 - 5.3 mEq/L   Chloride 103 96 - 112 mEq/L   CO2 22 19 - 32 mEq/L   Glucose, Bld 91 70 - 99 mg/dL   BUN 20 6 - 23 mg/dL   Creatinine, Ser 0.67 0.50 - 1.35 mg/dL   Calcium 8.2 (L) 8.4 - 10.5 mg/dL   GFR calc non Af Amer 85 (L) >90 mL/min   GFR calc Af Amer >90 >90 mL/min    Comment: (NOTE) The eGFR has been calculated using the CKD EPI equation. This calculation has not been validated in all clinical  situations. eGFR's persistently <90 mL/min signify possible Chronic Kidney Disease.    Anion gap 12 5 - 15     HEENT: normal Cardio: RRR Resp: CTA B/L GI: BS positive and NT,ND Extremity:  Edema Right hand and Right foot Skin:   Wound vertex of scalp, eschar Neuro: Lethargic, Cranial Nerve Abnormalities Right central 7, Abnormal Sensory reduced on left side to LT, Abnormal Motor 0/5 on right side and Tone:  Hypotonia Musc/Skel:  Other no pain with Right shoulder ROM Gen NAD   Assessment/Plan: 1. Functional deficits secondary to left paramedian pontine infarct which require 3+ hours per day of interdisciplinary therapy in a comprehensive inpatient rehab setting. Physiatrist is providing close team supervision and 24 hour management of active medical problems listed below. Physiatrist and rehab team continue to assess barriers to discharge/monitor patient progress toward functional and medical goals. Marland KitchenFIM: FIM - Bathing Bathing Steps Patient Completed: Chest, Right Arm, Abdomen, Front perineal area, Right upper leg, Left upper leg Bathing: 3: Mod-Patient completes 5-7 52f10 parts or 50-74%  FIM - Upper Body Dressing/Undressing Upper body dressing/undressing steps patient completed: Put head through opening of pull over shirt/dress, Thread/unthread right sleeve of pullover shirt/dresss Upper body dressing/undressing: 3: Mod-Patient completed 50-74% of tasks FIM - Lower Body Dressing/Undressing Lower body dressing/undressing steps patient completed: Thread/unthread left underwear leg, Thread/unthread left pants leg Lower body dressing/undressing: 1: Two helpers  FIM - Toileting Toileting:  1: Two helpers  FIM - Radio producer Devices: Recruitment consultant Transfers: 1-Two helpers (per [employer], [place])  FIM - Control and instrumentation engineer Devices: Arm rests Bed/Chair Transfer: 1: Two helpers  FIM - Locomotion:  Wheelchair Distance: 125' Locomotion: Wheelchair: 2: Travels 50 - 149 ft with maximal assistance (Pt: 25 - 49%) FIM - Locomotion: Ambulation Locomotion: Ambulation Assistive Devices: Other (comment) (L handrail in hallway) Ambulation/Gait Assistance: 1: +2 Total assist Locomotion: Ambulation: 1: Two helpers  Comprehension Comprehension Mode: Auditory Comprehension: 5-Understands basic 90% of the time/requires cueing < 10% of the time  Expression Expression Mode: Verbal Expression: 3-Expresses basic 50 - 74% of the time/requires cueing 25 - 50% of the time. Needs to repeat parts of sentences.  Social Interaction Social Interaction: 5-Interacts appropriately 90% of the time - Needs monitoring or encouragement for participation or interaction.  Problem Solving Problem Solving: 3-Solves basic 50 - 74% of the time/requires cueing 25 - 49% of the time  Memory Memory: 4-Recognizes or recalls 75 - 89% of the time/requires cueing 10 - 24% of the time   Medical Problem List and Plan: 1. Functional deficits secondary to left paramedian pontine infarct 2. DVT Prophylaxis/Anticoagulation: Pharmaceutical: Lovenox 3. Pain Management: N/A 4. Mood: LCSW to follow for evaluation and support.  5. Neuropsych: This patient is not capable of making decisions on his own behalf. 6. Skin/Wound Care:hx melanoma removal scalp, resume vaseline, family to bring in name of cream from dermatologist Routine pressure relief measures. Turn patient every 2 hours when in bed.  7. Fluids/Electrolytes/Nutrition: Monitor I/O. Check daily weights to monitor for signs of overload.  8. CAP: Will check follow CXR to monitor for stability/ fluid overload as continues to have difficulty handling secretions as well as cough. Antibiotic Day # 5 9. Acute renal insufficiency: Improved. Off HCTZ at this time. 10. HTN: Will monitor every 8 hours and allow permissive HTN for adequate perfusion. 11.  leucocytosis:  secondary to PNA . Follow temp curve for now.  12. Thrush: Will treat with short course of diflucan. Not complaining of mouth pain 13.  Diarrhea- C diff negative, antibiotic associated LOS (Days) 5 A FACE TO FACE EVALUATION WAS PERFORMED  Deanette Tullius E 07/07/2014, 8:08 AM

## 2014-07-07 NOTE — Progress Notes (Signed)
Speech Language Pathology Daily Session Note  Patient Details  Name: [name] MRN: [id number] Date of Birth: 30-Jun-1926  Today's Date: 07/07/2014 SLP Individual Time: 1120-1150 SLP Individual Time Calculation (min): 30 min  Short Term Goals: Week 1: SLP Short Term Goal 1 (Week 1): Patient will consume Dys.1 textures and nectar-thick liquids via cup with Min verbal cues to utilize safe swallow strategies to minimize overt s/s of aspiration. SLP Short Term Goal 2 (Week 1): Patient will produce intelligible speech at the phrase level with Mod multimodal cues to utilize compensatory strategies. SLP Short Term Goal 3 (Week 1): Patient will initiate basic self-care tasks with no more than 2 verbal cues.   SLP Short Term Goal 4 (Week 1): Patient will request help as needed with Mod question cues.  Skilled Therapeutic Interventions:   Pt was seen for skilled ST targeting dysphagia goals.  Upon arrival, pt was seated upright in wheelchair, asleep but easily awakened to voice and light touch.  Pt required min cues to maintain upright head posture to maximize swallowing safety during PO trials of ice chips following oral care.  Pt was noted with wet vocal quality post swallow indicative of decreased airway protection which was followed by delayed, weak cough.  Recommend that pt continue on nectar thick liquids for airway protection.      FIM:  Comprehension Comprehension Mode: Auditory Comprehension: 4-Understands basic 75 - 89% of the time/requires cueing 10 - 24% of the time Expression Expression Mode: Verbal Expression: 3-Expresses basic 50 - 74% of the time/requires cueing 25 - 50% of the time. Needs to repeat parts of sentences. Social Interaction Social Interaction: 4-Interacts appropriately 75 - 89% of the time - Needs redirection for appropriate language or to initiate interaction. Problem Solving Problem Solving: 2-Solves basic 25 - 49% of the time - needs direction more than half the  time to initiate, plan or complete simple activities Memory Memory: 3-Recognizes or recalls 50 - 74% of the time/requires cueing 25 - 49% of the time  Pain Pain Assessment Pain Assessment: No/denies pain Pain Score: 0-No pain  Therapy/Group: Individual Therapy  Travontae Freiberger, Selinda Orion 07/07/2014, 3:35 PM

## 2014-07-07 NOTE — Progress Notes (Signed)
Occupational Therapy Session Note  Patient Details  Name: [name] MRN: [id number] Date of Birth: 1926/04/11  Today's Date: 07/07/2014 OT Individual Time: 5462 - 7035 and 1225-1258 OT Individual Time Calculation (min): 60 min and 33 min    Short Term Goals: Week 1:  OT Short Term Goal 1 (Week 1): Pt will complete toilet transfer with max assist +1 OT Short Term Goal 2 (Week 1): Pt will complete LB dressing with max assist +1 OT Short Term Goal 3 (Week 1): Pt will complete UB dressing with max assist  OT Short Term Goal 4 (Week 1): Pt will sit unsupported during functional task for 2 min with min assist   Skilled Therapeutic Interventions/Progress Updates:  Session 1: Upon entering room, pt seated in wheelchair awaiting therapist with no c/o pain and wife present for session. Skilled OT session with focus on STS, weight shifting in standing, self care,  standing balance, and pt/fam education. Pt requesting sink bathing this session secondary to fatigue. OT continues to educate pt and caregiver on hemiplegic techniques but pt continues to have difficulty with tasks. Pt is able to verbalize steps of how to perform UB dressing but requires increased time and verbal cues for initiation when performing task itself. STS from wheelchair x 3 reps with multiple verbal cues for weight shifting to L side and cervical extension. Pt requiring assistance of 2nd person for LB bathing (buttocks in standing) and clothing management. Pt required Max A for balance while second person assisted with these tasks. OT educated pt and caregiver on R UE self ROM for attention and stretching to R UE for shoulder flexion, elbow flex/ext, wrist flex/ext, and digits flex/ext. Pt performed 5 reps of each during session with verbal cues for proper technique. Education to continue in this area. Pt seated in wheelchair with QRB and tray table donned.  Session 2: Upon entering room, pt seated in wheelchair with no c/o pain and  his wife, [name], present in room for session. OT present for skilled feeding session. Pt required set up A to open containers and all liquids mixed to nectar thick consistency for safety. OT checking for pocketing of food in which there did not appear to be any during session. Pt did require min verbal cues to slow down and swallow completely in between bites. Pt feeding self with L UE mostly during session. When eating magic cup, OT assisted in [facility] assistance to hold with R hand while utilizing spoon in L. Pt remained seated in wheelchair at end of session with QRB and tray table donned.   Therapy Documentation Precautions:  Precautions Precautions: Fall Precaution Comments: R hemiplegia, R gaze preference Restrictions Weight Bearing Restrictions: No General:   Vital Signs:  Pain: Pain Assessment Pain Score: 0-No pain Faces Pain Scale: No hurt PAINAD (Pain Assessment in Advanced Dementia) Breathing: normal  See FIM for current functional status  Therapy/Group: Individual Therapy  [name] 07/07/2014, 1:49 PM

## 2014-07-07 NOTE — Progress Notes (Signed)
Physical Therapy Session Note  Patient Details  Name: [name] MRN: [id number] Date of Birth: 29-Jul-1925  Today's Date: 07/07/2014 PT Individual Time: 0801-0905 PT Individual Time Calculation (min): 64 min   Short Term Goals: Week 1:  PT Short Term Goal 1 (Week 1): Pt to perform bed mobility with mod Ax1person PT Short Term Goal 2 (Week 1): Pt to perform bed<>w/c transfer with max Ax1person PT Short Term Goal 3 (Week 1): Pt to maintain dynamic sitting balance with mod Ax1person PT Short Term Goal 4 (Week 1): Pt to propel w/c 49' with mod Ax1person PT Short Term Goal 5 (Week 1): Pt to ambulate 10' with LRAD and Ax2persons  Skilled Therapeutic Interventions/Progress Updates:    Pt performs better with transfers in session when cued for weight shift, although pt with difficulty carrying over these cues without constant repetition. Pt limited in session by toileting needs in session. Pt would continue to benefit from skilled PT services to increase functional mobility.  Therapy Documentation Precautions:  Precautions Precautions: Fall Precaution Comments: R hemiplegia, R gaze preference Restrictions Weight Bearing Restrictions: No Pain: Pain Assessment Pain Score: 0-No pain Faces Pain Scale: No hurt PAINAD (Pain Assessment in Advanced Dementia) Breathing: normal Mobility:  Pt dependent for transfers with cues for weight shift and technique Other Treatments:  Pt performs transfers x10 in session. Anterior weight shifts 2x10 in session. Pt performs dual motor and cog tasks in session with sitting balance, standing balance, and bed mobility tasks within functional tasks. Dual tasks including reaching, grasping, sequencing, weight shifting, and LE manipulation. Pt cued for safety, sequencing, weight shift, and attention throughout. Pt educated on rehab plan, insight, and rehab progress in session  See FIM for current functional status  Therapy/Group: Individual Therapy  Monia Pouch 07/07/2014, 12:50 PM

## 2014-07-08 ENCOUNTER — Inpatient Hospital Stay (HOSPITAL_COMMUNITY): Payer: Medicare Other | Admitting: Physical Therapy

## 2014-07-08 NOTE — Progress Notes (Signed)
Physical Therapy Session Note  Patient Details  Name: [name] MRN: [id number] Date of Birth: [birth year]  Today's Date: 07/08/2014 PT Individual Time: 0730-0830 PT Individual Time Calculation (min): 60 min   Short Term Goals: Week 1:  PT Short Term Goal 1 (Week 1): Pt to perform bed mobility with mod Ax1person PT Short Term Goal 2 (Week 1): Pt to perform bed<>w/c transfer with max Ax1person PT Short Term Goal 3 (Week 1): Pt to maintain dynamic sitting balance with mod Ax1person PT Short Term Goal 4 (Week 1): Pt to propel w/c 36' with mod Ax1person PT Short Term Goal 5 (Week 1): Pt to ambulate 10' with LRAD and Ax2persons   Skilled Therapeutic Interventions/Progress Updates:    Pt demonstrates improvement in session requiring decreased assist for transfers. Pt with improved job anterior weight shifting in transfers, particularly after cueing. Pt limited at this time by poor core control with decreased ability to achieve neutral spine in sitting. Pt would continue to benefit from skilled PT services to increase functional mobility.  Therapy Documentation Precautions:  Precautions Precautions: Fall Precaution Comments: R hemiplegia, R gaze preference Restrictions Weight Bearing Restrictions: No  Vital Signs: Therapy Vitals Temp: 98.1 F (36.7 C) Temp Source: Oral Pulse Rate: 60 Resp: 18 BP: (!) 151/55 mmHg Patient Position (if appropriate): Lying Oxygen Therapy SpO2: 92 % O2 Device: Not Delivered Pain: Pain Assessment Pain Assessment: No/denies pain Mobility:  Pt is Max A for all mobility in session with cues for weight shift and technique Other Treatments:   Pt performs anterior weight shifts 5x10 in session. Pt performs thoracic AAROM 2x10 of t/s and l/s with facilitation. Partial sit to stands 2x5 in session. Pt performs bed mobility, static sitting, and dynamic sitting balance activities with dual motor tasks including weight shifting, reaching, and grasping  activities within functional context. Pt and grandson educated on rehab plan, deficits, and safety in mobility. Pt performs static standing with Dependent assist with SBQC in session.  See FIM for current functional status  Therapy/Group: Individual Therapy  Monia Pouch 07/08/2014, 8:08 AM

## 2014-07-08 NOTE — Progress Notes (Signed)
Subjective/Complaints: 80 y.o. left handed male with history of HTN, prostate cancer, recent PNA with malaise X 1 week who was admitted on 06/27/14 with right sided weakness with right gaze preference and difficulty talking. Patient was noted to be in A fib in setting of CAP and CT head without acute changes. EKG evaluated by cardiology and felt to have multiple artifacts with 1 degree AVB and repeat EKG with NSR. NIHSS- 14. 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 2D echo with EF 55-60% with mild focal basal atrophy and grade 1 diastolic dysfunction. Carotid dopplers without significant stenosis. 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   Visiting with grandson "working on enunciating" Review of Systems - Negative except weakness on right side Objective: Vital Signs: Blood pressure 151/55, pulse 60, temperature 98.1 F (36.7 C), temperature source Oral, resp. rate 18, height 5' 9"  (1.753 m), weight 67.9 kg (149 lb 11.1 oz), SpO2 92 %. No results found. Results for orders placed or performed during the hospital encounter of 07/02/14 (from the past 72 hour(s))  Basic metabolic panel     Status: Abnormal   Collection Time: 07/06/14  6:33 AM  Result Value Ref Range   Sodium 137 137 - 147 mEq/L   Potassium 3.9 3.7 - 5.3 mEq/L   Chloride 103 96 - 112 mEq/L   CO2 22 19 - 32 mEq/L   Glucose, Bld 91 70 - 99 mg/dL   BUN 20 6 - 23 mg/dL   Creatinine, Ser 0.67 0.50 - 1.35 mg/dL   Calcium 8.2 (L) 8.4 - 10.5 mg/dL   GFR calc non Af Amer 85 (L) >90 mL/min   GFR calc Af Amer >90 >90 mL/min    Comment: (NOTE) The eGFR has been calculated using the CKD EPI equation. This calculation has not been validated in all clinical  situations. eGFR's persistently <90 mL/min signify possible Chronic Kidney Disease.    Anion gap 12 5 - 15     HEENT: normal Cardio: RRR Resp: CTA B/L GI: BS positive and NT,ND Extremity:  Edema Right hand and Right foot Skin:   Wound vertex of scalp, eschar Neuro: Lethargic, Cranial Nerve Abnormalities Right central 7, Abnormal Sensory reduced on left side to LT, Abnormal Motor 0/5 on right side and Tone:  Hypotonia Musc/Skel:  Other no pain with Right shoulder ROM Gen NAD   Assessment/Plan: 1. Functional deficits secondary to left paramedian pontine infarct which require 3+ hours per day of interdisciplinary therapy in a comprehensive inpatient rehab setting. Physiatrist is providing close team supervision and 24 hour management of active medical problems listed below. Physiatrist and rehab team continue to assess barriers to discharge/monitor patient progress toward functional and medical goals. Marland KitchenFIM: FIM - Bathing Bathing Steps Patient Completed: Chest, Right Arm, Abdomen, Front perineal area, Right upper leg, Left upper leg Bathing: 3: Mod-Patient completes 5-7 54f10 parts or 50-74%  FIM - Upper Body Dressing/Undressing Upper body dressing/undressing steps patient completed: Put head through opening of pull over shirt/dress, Thread/unthread right sleeve of pullover shirt/dresss Upper body dressing/undressing: 3: Mod-Patient completed 50-74% of tasks FIM - Lower Body Dressing/Undressing Lower body dressing/undressing steps patient completed: Thread/unthread left pants leg, Thread/unthread left underwear leg Lower body dressing/undressing: 1: Two helpers  FIM - Toileting Toileting: 1: Total-Patient completed zero  steps, helper did all 3  FIM - Radio producer Devices: Bedside commode Toilet Transfers: 1-From toilet/BSC: Total A (helper does all/Pt. < 25%)  FIM - Bed/Chair Transfer Bed/Chair Transfer Assistive Devices: Arm rests Bed/Chair  Transfer: 2: Supine > Sit: Max A (lifting assist/Pt. 25-49%), 1: Chair or W/C > Bed: Total A (helper does all/Pt. < 25%), 1: Bed > Chair or W/C: Total A (helper does all/Pt. < 25%)  FIM - Locomotion: Wheelchair Distance: 125' Locomotion: Wheelchair: 1: Travels less than 50 ft with moderate assistance (Pt: 50 - 74%) FIM - Locomotion: Ambulation Locomotion: Ambulation Assistive Devices: Other (comment) (L handrail in hallway) Ambulation/Gait Assistance: 1: +2 Total assist Locomotion: Ambulation: 0: Activity did not occur  Comprehension Comprehension Mode: Auditory Comprehension: 4-Understands basic 75 - 89% of the time/requires cueing 10 - 24% of the time  Expression Expression Mode: Verbal Expression: 3-Expresses basic 50 - 74% of the time/requires cueing 25 - 50% of the time. Needs to repeat parts of sentences.  Social Interaction Social Interaction: 4-Interacts appropriately 75 - 89% of the time - Needs redirection for appropriate language or to initiate interaction.  Problem Solving Problem Solving: 2-Solves basic 25 - 49% of the time - needs direction more than half the time to initiate, plan or complete simple activities  Memory Memory: 3-Recognizes or recalls 50 - 74% of the time/requires cueing 25 - 49% of the time   Medical Problem List and Plan: 1. Functional deficits secondary to left paramedian pontine infarct 2. DVT Prophylaxis/Anticoagulation: Pharmaceutical: Lovenox 3. Pain Management: N/A 4. Mood: LCSW to follow for evaluation and support.  5. Neuropsych: This patient is not capable of making decisions on his own behalf. 6. Skin/Wound Care:hx melanoma removal scalp, resume vaseline, family to bring in name of cream from dermatologist Routine pressure relief measures. Turn patient every 2 hours when in bed.  7. Fluids/Electrolytes/Nutrition: Monitor I/O. Check daily weights to monitor for signs of overload.  8. CAP: Will check follow CXR to monitor for  stability/ fluid overload as continues to have difficulty handling secretions as well as cough.  9. Acute renal insufficiency: Improved. Off HCTZ at this time. 10. HTN: Will monitor every 8 hours and allow permissive HTN for adequate perfusion. 11.  leucocytosis: secondary to PNA . Follow temp curve for now.  12. Thrush: Will treat with short course of diflucan. Not complaining of mouth pain 13.  Diarrhea- C diff negative, antibiotic associated LOS (Days) 6 A FACE TO FACE EVALUATION WAS PERFORMED  Sarin Comunale E 07/08/2014, 8:31 AM

## 2014-07-09 ENCOUNTER — Inpatient Hospital Stay (HOSPITAL_COMMUNITY): Payer: Medicare Other | Admitting: Speech Pathology

## 2014-07-09 ENCOUNTER — Inpatient Hospital Stay (HOSPITAL_COMMUNITY): Payer: Medicare Other | Admitting: Rehabilitation

## 2014-07-09 ENCOUNTER — Inpatient Hospital Stay (HOSPITAL_COMMUNITY): Payer: Medicare Other

## 2014-07-09 ENCOUNTER — Inpatient Hospital Stay (HOSPITAL_COMMUNITY): Payer: Medicare Other | Admitting: Occupational Therapy

## 2014-07-09 DIAGNOSIS — R110 Nausea: Secondary | ICD-10-CM

## 2014-07-09 LAB — CBC WITH DIFFERENTIAL/PLATELET
BASOS ABS: 0 10*3/uL (ref 0.0–0.1)
Basophils Relative: 0 % (ref 0–1)
EOS PCT: 1 % (ref 0–5)
Eosinophils Absolute: 0.1 10*3/uL (ref 0.0–0.7)
HEMATOCRIT: 37.5 % — AB (ref 39.0–52.0)
Hemoglobin: 12.4 g/dL — ABNORMAL LOW (ref 13.0–17.0)
Lymphocytes Relative: 12 % (ref 12–46)
Lymphs Abs: 1.4 10*3/uL (ref 0.7–4.0)
MCH: 28.6 pg (ref 26.0–34.0)
MCHC: 33.1 g/dL (ref 30.0–36.0)
MCV: 86.4 fL (ref 78.0–100.0)
MONO ABS: 0.9 10*3/uL (ref 0.1–1.0)
Monocytes Relative: 8 % (ref 3–12)
Neutro Abs: 9.6 10*3/uL — ABNORMAL HIGH (ref 1.7–7.7)
Neutrophils Relative %: 79 % — ABNORMAL HIGH (ref 43–77)
Platelets: 392 10*3/uL (ref 150–400)
RBC: 4.34 MIL/uL (ref 4.22–5.81)
RDW: 12.8 % (ref 11.5–15.5)
WBC: 12 10*3/uL — ABNORMAL HIGH (ref 4.0–10.5)

## 2014-07-09 NOTE — Progress Notes (Signed)
Occupational Therapy Session Note  Patient Details  Name: [name] MRN: [id number] Date of Birth: 25-Oct-1925  Today's Date: 07/09/2014 OT Individual Time: 0900-1000 OT Individual Time Calculation (min): 60 min   Short Term Goals: Week 1:  OT Short Term Goal 1 (Week 1): Pt will complete toilet transfer with max assist +1 OT Short Term Goal 2 (Week 1): Pt will complete LB dressing with max assist +1 OT Short Term Goal 3 (Week 1): Pt will complete UB dressing with max assist  OT Short Term Goal 4 (Week 1): Pt will sit unsupported during functional task for 2 min with min assist   Skilled Therapeutic Interventions/Progress Updates:  Patient resting in bed upon arrival with wife present and stating that the NT just cleaned him up and changed his brief.  Patient reports feeling very tired due to "up all night with nausea and vomiting".  Patient given choice to get out of bed for bathing and dressing or to complete the tasks seated EOB and lay down to pull up pants.  Patient requested to "take it easy today".  Focused session on activity tolerance, sitting balance, postural control, forward/anterior weight shifts, lateral weight shifts to left, and hemi dressing techniques.  Patient required mod-max verbal and tactile cues for unsupported static and dynamic sitting balance while seated EOB secondary to patient with LOB posterior and to the right which conitinued to worsen as patient became fatigued.  Patient also with pushing behaviors while seated which also worsened as he fatigued.  Provided education on left shoulder GH inferior subluxation to include handout reviewed with patient and wife.  Patient requested not to stand to pull up his pants today due to fatigue and tired from lack of sleep last night.  Assisted patient back to bed to perform bridging and rolling to get pants up with assistance.  Therapy Documentation Precautions:  Precautions Precautions: Fall Precaution Comments: R  hemiplegia, R gaze preference Restrictions Weight Bearing Restrictions: No Pain: No c/o/ pain ADL: See FIM for current functional status  Therapy/Group: Individual Therapy  Thora Scherman 07/09/2014, 10:38 AM

## 2014-07-09 NOTE — Plan of Care (Signed)
Problem: RH BOWEL ELIMINATION Goal: RH STG MANAGE BOWEL WITH ASSISTANCE STG Manage Bowel with Min Assistance.  Outcome: Progressing Patient has occasional incontinent episodes @ hs but will use BR during the day  Problem: RH BLADDER ELIMINATION Goal: RH STG MANAGE BLADDER WITH ASSISTANCE STG Manage Bladder With Min Assistance  Pt will use urinal @ times, but is frequently incontinent due to urgency, wears briefs

## 2014-07-09 NOTE — Progress Notes (Signed)
Physical Therapy Session Note  Patient Details  Name: [name] MRN: [id number] Date of Birth: 1926/04/04  Today's Date: 07/09/2014 PT Individual Time: 1400-1500 PT Individual Time Calculation (min): 60 min   Short Term Goals: Week 1:  PT Short Term Goal 1 (Week 1): Pt to perform bed mobility with mod Ax1person PT Short Term Goal 2 (Week 1): Pt to perform bed<>w/c transfer with max Ax1person PT Short Term Goal 3 (Week 1): Pt to maintain dynamic sitting balance with mod Ax1person PT Short Term Goal 4 (Week 1): Pt to propel w/c 56' with mod Ax1person PT Short Term Goal 5 (Week 1): Pt to ambulate 10' with LRAD and Ax2persons  Skilled Therapeutic Interventions/Progress Updates:   Pt received sitting in w/c in room, having just finished SLP session and agreeable to PT session.  Wife and granddson present during session.  Skilled session focused on dynamic sitting on kay bench for anterior weight shift, upright posture and increased weight shift to the L.  Also worked in mini squat for equal WB through LEs, midline orientation and reaching L to decrease pusher tendencies.  Performed squat pivot transfer w/c<>mat and w/c>bed via bobath method at max A level.  Requires max facilitation for forward weight shift and trunk lean for better buttocks clearance and WB through LEs.  Performed scooting in order to scoot forwards and backwards onto/off of kay bench with max A and max verbal cues for lateral leans and activation of trunk.  Once on kay bench, performed several reps of reaching up and to the L in order for therapist to assist with anterior pelvic tilt, upright trunk and head and increased WB through L hip.  Progressed to rolling ball forward on bench with BUEs, again with focus on forward weight shift while maintaining upright head and chest to carryover to standing and transfers.  Ended session with reaching task while in squatted position for equal WB through LEs.  Requires max to total A to  carry out task with max verbal cues for using mirror for visual feedback on posture, as he continues to push with LLE to the R.  Assisted pt back to w/c and back to room to bed as stated above.  Left in bed with bed alarm set and all needs in reach, family in room.   Therapy Documentation Precautions:  Precautions Precautions: Fall Precaution Comments: R hemiplegia, R gaze preference Restrictions Weight Bearing Restrictions: No   Vital Signs: Therapy Vitals Temp: 98 F (36.7 C) Temp Source: Oral Pulse Rate: 66 Resp: 18 BP: 139/63 mmHg Patient Position (if appropriate): Lying Oxygen Therapy SpO2: 96 % O2 Device: Not Delivered Pain: Pt with no reports of pain during therapy.   See FIM for current functional status  Therapy/Group: Individual Therapy  [name] 07/09/2014, 4:02 PM

## 2014-07-09 NOTE — Progress Notes (Signed)
Speech Language Pathology Daily Session Note  Patient Details  Name: [name] MRN: [id number] Date of Birth: 1926/03/24  Today's Date: 07/09/2014 SLP Individual Time: 1300-1400 SLP Individual Time Calculation (min): 60 min  Short Term Goals: Week 1: SLP Short Term Goal 1 (Week 1): Patient will consume Dys.1 textures and nectar-thick liquids via cup with Min verbal cues to utilize safe swallow strategies to minimize overt s/s of aspiration. SLP Short Term Goal 2 (Week 1): Patient will produce intelligible speech at the phrase level with Mod multimodal cues to utilize compensatory strategies. SLP Short Term Goal 3 (Week 1): Patient will initiate basic self-care tasks with no more than 2 verbal cues.   SLP Short Term Goal 4 (Week 1): Patient will request help as needed with Mod question cues.  Skilled Therapeutic Interventions: Skilled treatment session focused on addressing dysphagia goals.  SLP facilitated session with set-up of oral care via suctioning and Supervision verbal cues for thoroughness. Patient consumed trials of ice chips with timely mastication and overt coughs in 6/8 trials; patient's increased sensation as compared to previous session is suggestive of increased pharyngeal sensation of penetration/aspiration.  SLP provided Mod verbal cues for strong coughs following each trial to reduce penetrates/aspirates.  During a separate task patient consumed trials of Dys. 2 textures with effective mastication and Min verbal cues to manage pocketing with lingual sweep as well as nectar-thick liquid cup sips, which was effective at preventing overt s/s of aspiration.  Recommend advanced texture trial tray with SLP tomorrow to assess overall endurance with a meal and to continue ice chips trials with SLP only.      FIM:  Comprehension Comprehension Mode: Auditory Comprehension: 5-Understands basic 90% of the time/requires cueing < 10% of the time Expression Expression Mode:  Verbal Expression: 3-Expresses basic 50 - 74% of the time/requires cueing 25 - 50% of the time. Needs to repeat parts of sentences. Social Interaction Social Interaction: 4-Interacts appropriately 75 - 89% of the time - Needs redirection for appropriate language or to initiate interaction. Problem Solving Problem Solving: 3-Solves basic 50 - 74% of the time/requires cueing 25 - 49% of the time Memory Memory: 3-Recognizes or recalls 50 - 74% of the time/requires cueing 25 - 49% of the time FIM - Eating Eating Activity: 5: Set-up assist for open containers;5: Needs verbal cues/supervision;4: Helper occasionally scoops food on utensil;4: Helper checks for pocketed food  Pain Pain Assessment Pain Assessment: No/denies pain  Therapy/Group: Individual Therapy  Carmelia Roller., CCC-SLP [phone]  [place] 07/09/2014, 4:27 PM

## 2014-07-09 NOTE — Progress Notes (Signed)
Subjective/Complaints: 80 y.o. left handed male with history of HTN, prostate cancer, recent PNA with malaise X 1 week who was admitted on 06/27/14 with right sided weakness with right gaze preference and difficulty talking. Patient was noted to be in A fib in setting of CAP and CT head without acute changes. EKG evaluated by cardiology and felt to have multiple artifacts with 1 degree AVB and repeat EKG with NSR. NIHSS- 14. 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   Nausea started after dinner last noc and worsened after 10pm med No abd pain, no diarrhea  Review of Systems - Negative except weakness on right side Objective: Vital Signs: Blood pressure 155/55, pulse 71, temperature 97.8 F (36.6 C), temperature source Oral, resp. rate 17, height 5\' 9"  (1.753 m), weight 67.9 kg (149 lb 11.1 oz), SpO2 92 %. No results found. No results found for this or any previous visit (from the past 72 hour(s)).   HEENT: normal Cardio: RRR Resp: CTA B/L GI: BS positive and NT,ND Extremity:  Edema Right hand and Right foot Skin:   Wound vertex of scalp, eschar Neuro: Lethargic, Cranial Nerve Abnormalities Right central 7, Abnormal Sensory reduced on left side to LT, Abnormal Motor 0/5 on right side except trace Right foot plantar flexion and Tone:  Hypotonia Musc/Skel:  Other no pain with Right shoulder ROM Gen NAD   Assessment/Plan: 1. Functional deficits secondary to left paramedian pontine infarct which require 3+ hours per day of interdisciplinary therapy in a comprehensive inpatient rehab setting. Physiatrist is providing close team supervision and 24 hour management of active medical  problems listed below. Physiatrist and rehab team continue to assess barriers to discharge/monitor patient progress toward functional and medical goals. Marland KitchenFIM: FIM - Bathing Bathing Steps Patient Completed: Chest, Right Arm, Abdomen, Front perineal area, Right upper leg, Left upper leg Bathing: 3: Mod-Patient completes 5-7 50f 10 parts or 50-74%  FIM - Upper Body Dressing/Undressing Upper body dressing/undressing steps patient completed: Put head through opening of pull over shirt/dress, Thread/unthread right sleeve of pullover shirt/dresss Upper body dressing/undressing: 3: Mod-Patient completed 50-74% of tasks FIM - Lower Body Dressing/Undressing Lower body dressing/undressing steps patient completed: Thread/unthread left pants leg, Thread/unthread left underwear leg Lower body dressing/undressing: 1: Two helpers  FIM - Toileting Toileting: 1: Total-Patient completed zero steps, helper did all 3  FIM - Radio producer Devices: Bedside commode Toilet Transfers: 1-From toilet/BSC: Total A (helper does all/Pt. < 25%)  FIM - Bed/Chair Transfer Bed/Chair Transfer Assistive Devices: Arm rests Bed/Chair Transfer: 2: Supine > Sit: Max A (lifting assist/Pt. 25-49%), 2: Bed > Chair or W/C: Max A (lift and lower assist), 2: Chair or W/C > Bed: Max A (lift and lower assist)  FIM - Locomotion: Wheelchair Distance: 125' Locomotion: Wheelchair: 1: Travels less than 50 ft with moderate assistance (Pt: 50 - 74%) FIM - Locomotion: Ambulation Locomotion: Ambulation Assistive Devices: Other (comment) (L handrail in hallway) Ambulation/Gait Assistance: 1: +2 Total assist Locomotion: Ambulation: 0: Activity did not occur  Comprehension Comprehension Mode: Auditory Comprehension: 4-Understands basic 75 - 89% of the time/requires cueing 10 - 24% of the time  Expression Expression Mode: Verbal Expression: 3-Expresses basic 50 - 74% of the time/requires cueing 25 - 50%  of the  time. Needs to repeat parts of sentences.  Social Interaction Social Interaction: 4-Interacts appropriately 75 - 89% of the time - Needs redirection for appropriate language or to initiate interaction.  Problem Solving Problem Solving: 3-Solves basic 50 - 74% of the time/requires cueing 25 - 49% of the time  Memory Memory: 3-Recognizes or recalls 50 - 74% of the time/requires cueing 25 - 49% of the time   Medical Problem List and Plan: 1. Functional deficits secondary to left paramedian pontine infarct 2. DVT Prophylaxis/Anticoagulation: Pharmaceutical: Lovenox 3. Pain Management: N/A 4. Mood: LCSW to follow for evaluation and support.  5. Neuropsych: This patient is not capable of making decisions on his own behalf. 6. Skin/Wound Care:hx melanoma removal scalp, resume vaseline, family to bring in name of cream from dermatologist Routine pressure relief measures. Turn patient every 2 hours when in bed.  7. Fluids/Electrolytes/Nutrition: Monitor I/O. Check daily weights to monitor for signs of overload.  8. CAP: Will check follow CXR to monitor for stability/ fluid overload as continues to have difficulty handling secretions as well as cough.  9. Acute renal insufficiency: Improved. Off HCTZ at this time. 10. HTN: Will monitor every 8 hours and allow permissive HTN for adequate perfusion. 11.  leucocytosis: secondary to PNA . Follow temp curve for now.  12. Thrush: Will treat with short course of diflucan. Not complaining of mouth pain 13.  Nausea, suspect augmentin, completing course soon LOS (Days) 7 A FACE TO FACE EVALUATION WAS PERFORMED  Kenny Stern E 07/09/2014, 8:14 AM

## 2014-07-10 ENCOUNTER — Inpatient Hospital Stay (HOSPITAL_COMMUNITY): Payer: Medicare Other | Admitting: Rehabilitation

## 2014-07-10 ENCOUNTER — Inpatient Hospital Stay (HOSPITAL_COMMUNITY): Payer: Medicare Other | Admitting: Occupational Therapy

## 2014-07-10 ENCOUNTER — Inpatient Hospital Stay (HOSPITAL_COMMUNITY): Payer: Medicare Other | Admitting: Speech Pathology

## 2014-07-10 NOTE — Plan of Care (Signed)
Problem: RH BOWEL ELIMINATION Goal: RH STG MANAGE BOWEL W/MEDICATION W/ASSISTANCE STG Manage Bowel with Medication with [place].  Outcome: Not Progressing meds given by staff.crushed and in puree,

## 2014-07-10 NOTE — Progress Notes (Signed)
Subjective/Complaints: 80 y.o. left handed male with history of HTN, prostate cancer, recent PNA with malaise X 1 week who was admitted on 06/27/14 with right sided weakness with right gaze preference and difficulty talking. Patient was noted to be in A fib in setting of CAP and CT head without acute changes. EKG evaluated by cardiology and felt to have multiple artifacts with 1 degree AVB and repeat EKG with NSR. NIHSS- 14. 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   Nausea improved, feels like breakfast today, no vomiting , no abd pain No abd pain, no diarrhea  Review of Systems - Negative except weakness on right side Objective: Vital Signs: Blood pressure 173/57, pulse 65, temperature 97.4 F (36.3 C), temperature source Oral, resp. rate 16, height 5\' 9"  (1.753 m), weight 62.6 kg (138 lb 0.1 oz), SpO2 96 %. Dg Chest 2 View  07/09/2014   CLINICAL DATA:  malaise for 1 week. Recent pneumonia. History of prostate carcinoma and hypertension  EXAM: CHEST  2 VIEW  COMPARISON:  July 02, 2014  FINDINGS: There is slightly less infiltrate in the right lower lobe posteriorly and medially compared to recent prior study. Moderate infiltrate remains in these areas, however. There is mild atelectatic change in the left base. There is apical pleural thickening bilaterally which is stable. No new opacity is seen. The heart size and pulmonary vascularity are normal. No adenopathy. There is degenerative change in the thoracic spine. There is slight anterior wedging of a mid thoracic vertebral body, stable. Atherosclerotic change in the aorta is again noted.  IMPRESSION: Slightly less infiltrate in the  right lower lobe compared to recent prior study. Stable mild atelectatic change in the left base as well as stable bilateral apical pleural thickening bilaterally. No new opacity.   Electronically Signed   By: Lowella Grip M.D.   On: 07/09/2014 11:09   Results for orders placed or performed during the hospital encounter of 07/02/14 (from the past 72 hour(s))  CBC with Differential     Status: Abnormal   Collection Time: 07/09/14  8:49 AM  Result Value Ref Range   WBC 12.0 (H) 4.0 - 10.5 K/uL   RBC 4.34 4.22 - 5.81 MIL/uL   Hemoglobin 12.4 (L) 13.0 - 17.0 g/dL   HCT 37.5 (L) 39.0 - 52.0 %   MCV 86.4 78.0 - 100.0 fL   MCH 28.6 26.0 - 34.0 pg   MCHC 33.1 30.0 - 36.0 g/dL   RDW 12.8 11.5 - 15.5 %   Platelets 392 150 - 400 K/uL   Neutrophils Relative % 79 (H) 43 - 77 %   Neutro Abs 9.6 (H) 1.7 - 7.7 K/uL   Lymphocytes Relative 12 12 - 46 %   Lymphs Abs 1.4 0.7 - 4.0 K/uL   Monocytes Relative 8 3 - 12 %   Monocytes Absolute 0.9 0.1 - 1.0 K/uL   Eosinophils Relative 1 0 - 5 %   Eosinophils Absolute 0.1 0.0 - 0.7 K/uL   Basophils Relative 0 0 - 1 %   Basophils Absolute 0.0 0.0 - 0.1 K/uL     HEENT: normal Cardio: RRR Resp: CTA B/L GI: BS positive and NT,ND Extremity:  Edema Right hand and Right foot Skin:  Wound vertex of scalp, eschar Neuro: Lethargic, Cranial Nerve Abnormalities Right central 7, Abnormal Sensory reduced on left side to LT, Abnormal Motor 0/5 on right side except trace Right foot plantar flexion and Tone:  Hypotonia Musc/Skel:  Other no pain with Right shoulder ROM Gen NAD   Assessment/Plan: 1. Functional deficits secondary to left paramedian pontine infarct which require 3+ hours per day of interdisciplinary therapy in a comprehensive inpatient rehab setting. Physiatrist is providing close team supervision and 24 hour management of active medical problems listed below. Physiatrist and rehab team continue to assess barriers to discharge/monitor patient  progress toward functional and medical goals. Marland KitchenFIM: FIM - Bathing Bathing Steps Patient Completed: Chest, Right Arm, Abdomen, Left upper leg Bathing: 2: Max-Patient completes 3-4 73f 10 parts or 25-49% (sitting unsupported at EOB)  FIM - Upper Body Dressing/Undressing Upper body dressing/undressing steps patient completed: Put head through opening of pull over shirt/dress, Thread/unthread left sleeve of pullover shirt/dress, Pull shirt over trunk Upper body dressing/undressing: 4: Min-Patient completed 75 plus % of tasks FIM - Lower Body Dressing/Undressing Lower body dressing/undressing steps patient completed: Thread/unthread left pants leg Lower body dressing/undressing: 1: Total-Patient completed less than 25% of tasks (unsupported EOB, bridge in supine and roll)  FIM - Toileting Toileting: 1: Total-Patient completed zero steps, helper did all 3  FIM - Radio producer Devices: Bedside commode Toilet Transfers: 1-From toilet/BSC: Total A (helper does all/Pt. < 25%)  FIM - Bed/Chair Transfer Bed/Chair Transfer Assistive Devices: Arm rests Bed/Chair Transfer: 2: Bed > Chair or W/C: Max A (lift and lower assist), 2: Chair or W/C > Bed: Max A (lift and lower assist)  FIM - Locomotion: Wheelchair Distance: 125' Locomotion: Wheelchair: 0: Activity did not occur FIM - Locomotion: Ambulation Locomotion: Ambulation Assistive Devices: Other (comment) (L handrail in hallway) Ambulation/Gait Assistance: 1: +2 Total assist Locomotion: Ambulation: 0: Activity did not occur  Comprehension Comprehension Mode: Auditory Comprehension: 4-Understands basic 75 - 89% of the time/requires cueing 10 - 24% of the time  Expression Expression Mode: Verbal Expression: 4-Expresses basic 75 - 89% of the time/requires cueing 10 - 24% of the time. Needs helper to occlude trach/needs to repeat words.  Social Interaction Social Interaction: 4-Interacts appropriately 75 - 89% of  the time - Needs redirection for appropriate language or to initiate interaction.  Problem Solving Problem Solving: 3-Solves basic 50 - 74% of the time/requires cueing 25 - 49% of the time  Memory Memory: 3-Recognizes or recalls 50 - 74% of the time/requires cueing 25 - 49% of the time   Medical Problem List and Plan: 1. Functional deficits secondary to left paramedian pontine infarct 2. DVT Prophylaxis/Anticoagulation: Pharmaceutical: Lovenox 3. Pain Management: N/A 4. Mood: LCSW to follow for evaluation and support.  5. Neuropsych: This patient is not capable of making decisions on his own behalf. 6. Skin/Wound Care:hx melanoma removal scalp, resume vaseline, family to bring in name of cream from dermatologist Routine pressure relief measures. Turn patient every 2 hours when in bed.  7. Fluids/Electrolytes/Nutrition: Monitor I/O. Check daily weights to monitor for signs of overload.  8. CAP: followup CXR improved , WBCs normalizing as well , D/C abx9. Acute renal insufficiency: Improved. Off HCTZ at this time. 10. HTN: Will monitor every 8 hours and allow permissive HTN for adequate perfusion. 11.  leucocytosis: secondary to PNA . Follow temp curve for now.  12. Thrush: Will treat with short course of diflucan. Not complaining of mouth pain 13.  Nausea, suspect augmentin,  completing course  LOS (Days) 8 A FACE TO FACE EVALUATION WAS PERFORMED  [name] 07/10/2014, 8:18 AM

## 2014-07-10 NOTE — Progress Notes (Signed)
Speech Language Pathology Weekly Progress and Session Note  Patient Details  Name: [name] MRN: [id number] Date of Birth: 1926/03/31  Beginning of progress report period: July 03, 2014 End of progress report period: July 10, 2014  Today's Date: 07/10/2014 SLP Individual Time: 7654-6503 SLP Individual Time Calculation (min): 65 min  Short Term Goals: Week 1: SLP Short Term Goal 1 (Week 1): Patient will consume Dys.1 textures and nectar-thick liquids via cup with Min verbal cues to utilize safe swallow strategies to minimize overt s/s of aspiration. SLP Short Term Goal 1 - Progress (Week 1): Met SLP Short Term Goal 2 (Week 1): Patient will produce intelligible speech at the phrase level with Mod multimodal cues to utilize compensatory strategies. SLP Short Term Goal 2 - Progress (Week 1): Met SLP Short Term Goal 3 (Week 1): Patient will initiate basic self-care tasks with no more than 2 verbal cues.   SLP Short Term Goal 3 - Progress (Week 1): Progressing toward goal SLP Short Term Goal 4 (Week 1): Patient will request help as needed with Mod question cues. SLP Short Term Goal 4 - Progress (Week 1): Met    New Short Term Goals: Week 2: SLP Short Term Goal 1 (Week 2): Patient will consume Dys.2 textures and nectar-thick liquids via cup with Supervision verbal cues to utilize safe swallow strategies to minimize overt s/s of aspiration. SLP Short Term Goal 2 (Week 2): Patient will demonstrate effective mastication of Dys. 3 textures across 2 consecutive sessions with Min verbal cues for portion control and use of lingual sweeps to minimize overt s/s of aspiration. SLP Short Term Goal 3 (Week 2): Patient will demonstrate readiness for repeat objective swallow assessment with improved managment of oral secreations with no more than 5 cues needed to manage secreations in a 30 minute session. SLP Short Term Goal 4 (Week 2): Patient will produce intelligible speech at the phrase level  with Min multimodal cues to utilize compensatory strategies. SLP Short Term Goal 5 (Week 2): Patient will initiate basic self-care tasks with no more than 2 verbal cues.    Weekly Progress Updates: Patient has made functional gains and has met 3 out of 4 short term goals this reporting period due to improved oral phase of swallow resulting in texture advancement from Dsy.1 to Dys.2 as well as increased speech intelligibility at the phrase level.  Decreased trunk strength, cough strength and head posture continue to be limiting factors that are being addressed across disciplines.  Currently, patient continues to require Mod assist for completion of basic self-care tasks that he attempts and required Min cues to use of safe swallow strategies.  Patient and family education is ongoing. Patient would benefit from continued skilled SLP intervention in order to maximize his functional independence prior to discharge home with 24/7 assist.    Intensity: Minumum of 1-2 x/day, 30 to 90 minutes Frequency: 5 out of 7 days Duration/Length of Stay: 21-24 days Treatment/Interventions: Cognitive remediation/compensation;Cueing hierarchy;Dysphagia/aspiration precaution training;Environmental controls;Functional tasks;Internal/external aids;Oral motor exercises;Patient/family education;Speech/Language facilitation;Therapeutic Activities   Daily Session  Skilled Therapeutic Interventions: Skilled treatment session focused on addressing dysphagia goals.  SLP facilitated session with set-up of oral care via suctioning and Supervision verbal cues for thoroughness. Patient consumed advanced textures, Dys.2 trials with nectar-thick liquids via cup.  Patient demonstrated prolonged but functional mastication and a weak delayed cough x2 which SLP suspects was a result of large bites.  Min verbal cues from SLP with focus on portion control and management of  pocketing with lingual sweep as well as nectar-thick liquid cup sips,  was effective at preventing any further overt s/s of aspiration.  Recommend to continue with Dys.2 textures and full staff supervision at this time.  SLP educated patient and family on recommendations; all in agreement at this time.        FIM:  Comprehension Comprehension Mode: Auditory Comprehension: 5-Understands basic 90% of the time/requires cueing < 10% of the time Expression Expression Mode: Verbal Expression: 3-Expresses basic 50 - 74% of the time/requires cueing 25 - 50% of the time. Needs to repeat parts of sentences. Social Interaction Social Interaction: 4-Interacts appropriately 75 - 89% of the time - Needs redirection for appropriate language or to initiate interaction. Problem Solving Problem Solving: 3-Solves basic 50 - 74% of the time/requires cueing 25 - 49% of the time Memory Memory: 3-Recognizes or recalls 50 - 74% of the time/requires cueing 25 - 49% of the time FIM - Eating Eating Activity: 5: Set-up assist for open containers;5: Needs verbal cues/supervision;4: Helper occasionally scoops food on utensil;4: Helper checks for pocketed food General    Pain Pain Assessment Pain Assessment: No/denies pain  Therapy/Group: Individual Therapy  Carmelia Roller., CCC-SLP [phone]  [place] 07/10/2014, 1:45 PM

## 2014-07-10 NOTE — Progress Notes (Signed)
Physical Therapy Weekly Progress Note  Patient Details  Name: [name] MRN: [id number] Date of Birth: 20-May-1926  Beginning of progress report period: July 03, 2014 End of progress report period: July 10, 2014  Today's Date: 07/10/2014 PT Individual Time: 1300-1400 PT Individual Time Calculation (min): 60 min   Patient has met 3 of 5 short term goals.  Pt making slow progress with all aspects of mobility.  Continues to be greatly limited by decreased spine mobility (esp decreased lumbar and pelvic mobility) limiting amount of forward weight shift.  Also limited by decreased sitting balance, decreased attention to the R, and pusher tendencies.  Continue to feel that he will need hands on care at time of D/C, therefore if wife wishing to have pt return home, will likely need 24/7 hired help.    Patient continues to demonstrate the following deficits: decreased sitting balance, decreased postural control, decreased functional use of RUE/LE, decreased attention, pusher tendencies, decreased spinal mobility and therefore will continue to benefit from skilled PT intervention to enhance overall performance with activity tolerance, balance, postural control, ability to compensate for deficits, functional use of  right upper extremity and right lower extremity, attention, awareness and knowledge of precautions.  Patient progressing toward long term goals..  Continue plan of care.  PT Short Term Goals Week 1:  PT Short Term Goal 1 (Week 1): Pt to perform bed mobility with mod Ax1person PT Short Term Goal 1 - Progress (Week 1): Progressing toward goal PT Short Term Goal 2 (Week 1): Pt to perform bed<>w/c transfer with max Ax1person PT Short Term Goal 2 - Progress (Week 1): Met PT Short Term Goal 3 (Week 1): Pt to maintain dynamic sitting balance with mod Ax1person PT Short Term Goal 3 - Progress (Week 1): Met PT Short Term Goal 4 (Week 1): Pt to propel w/c 75' with mod Ax1person PT Short  Term Goal 4 - Progress (Week 1): Progressing toward goal PT Short Term Goal 5 (Week 1): Pt to ambulate 10' with LRAD and Ax2persons PT Short Term Goal 5 - Progress (Week 1): Met Week 2:  PT Short Term Goal 1 (Week 2): Pt to perform bed mobility with mod Ax1person PT Short Term Goal 2 (Week 2): Pt to perform bed<>w/c transfer with mod Ax1person PT Short Term Goal 3 (Week 2): Pt to maintain dynamic sitting balance with min Ax1person PT Short Term Goal 4 (Week 2): Pt to propel w/c 13' with mod Ax1person PT Short Term Goal 5 (Week 2): Pt to ambulate 25' with LRAD and Ax2persons  Skilled Therapeutic Interventions/Progress Updates:   Pt received lying in bed, agreeable to therapy session.  Performed bed mobility with HOB flat and without rails to continue to work on trunk flexion and rotation with attention to RUE.  Requires mod A for full roll to L side, assist for lowering LEs off EOB and assist at trunk to elevate into sitting.  Once at EOB assisted with donning socks and shoes.  Performed squat pivot transfer bed>w/c at max A level (+2 for safety) via bobath method to facilitate increased forward trunk flexion and forward weight shift.  Assisted to/from therapy gym in order for session to focus on NMR for RLE and upright posture through tall kneeling activity.  Once in tall kneeling, RT joined session in order to assist with upright support with BUEs on RT's arms/shoulders progressing to having pt reaching with LUE far to upper L with PT standing behind pt on mat provided facilitation  for increased hip extension and anterior pelvic tilt.  Performed several bouts of reaching x 3 reps of 30 secs each.  Then progressed to pt sitting on heels to upright position with cue for "bump bench with your belly" for even more anterior pelvic tilt and increased hip extension activation.  Pt tolerated very well but was fatigued at end.  Assisted back into sitting and ended session with 5 reps of sit<>stand with pt reaching  with LUE to target to increase upright head/chest posture when standing and therapist providing max A for trunk lean at hips instead of spine.  Pt transferred back to w/c as stated above.  Assisted back to room and left in w/c with all needs in reach and quick release belt donned.  Notified RN of pt not wanting the lunch he received and desire to order new lunch.    Therapy Documentation Precautions:  Precautions Precautions: Fall Precaution Comments: R hemiplegia, R gaze preference Restrictions Weight Bearing Restrictions: No   Vital Signs: Therapy Vitals Temp: 97.4 F (36.3 C) Temp Source: Oral Pulse Rate: 65 Resp: 16 BP: (!) 173/57 mmHg Oxygen Therapy SpO2: 96 % O2 Device: Not Delivered Pain: Pt with no reports in pain.   See FIM for current functional status  Therapy/Group: Individual Therapy and cotx with RT  [name] 07/10/2014, 7:54 AM

## 2014-07-10 NOTE — Progress Notes (Signed)
Occupational Therapy Session Note  Patient Details  Name: [name] MRN: [id number] Date of Birth: 1925-10-06  Today's Date: 07/10/2014 OT Individual Time: 2500-3704 OT Individual Time Calculation (min): 60 min    Short Term Goals: Week 1:  OT Short Term Goal 1 (Week 1): Pt will complete toilet transfer with max assist +1 OT Short Term Goal 2 (Week 1): Pt will complete LB dressing with max assist +1 OT Short Term Goal 3 (Week 1): Pt will complete UB dressing with max assist  OT Short Term Goal 4 (Week 1): Pt will sit unsupported during functional task for 2 min with min assist   Skilled Therapeutic Interventions/Progress Updates:    Engaged in self-care retraining with focus on increased participation with self-care tasks, sit <> stand, standing tolerance, and transfers.  Pt reports fatigued prior to session, but willing to participate.  Completed bathing and dressing at sit > stand level at sink with focus of forward weight shifting to wash lower legs and to assist with sit > stand.  Pt required max assist sit <> stand and mod assist to maintain standing while 2nd person assisted with washing buttocks and pulling pants over hips.  Pt with good attention to RUE during self-care and would reposition UE throughout tasks and prior to sit <> stand and transfers.  Mod verbal cues provided to increase participation with UB bathing and dressing with encouragement to attempt prior to saying "I can't do it".  Pt requested to return to bed at end of session due to fatigue.  Squat pivot, over the back, transfer to promote forward weight shift with max assist with 2nd person present for safety.  Positioned in bed with RUE elevated on pillow and call bell in reach.  Therapy Documentation Precautions:  Precautions Precautions: Fall Precaution Comments: R hemiplegia, R gaze preference Restrictions Weight Bearing Restrictions: No Pain: Pain Assessment Pain Assessment: No/denies pain  See FIM for  current functional status  Therapy/Group: Individual Therapy  Simonne Come 07/10/2014, 12:17 PM

## 2014-07-11 ENCOUNTER — Inpatient Hospital Stay (HOSPITAL_COMMUNITY): Payer: Medicare Other | Admitting: Occupational Therapy

## 2014-07-11 ENCOUNTER — Inpatient Hospital Stay (HOSPITAL_COMMUNITY): Payer: Medicare Other | Admitting: Speech Pathology

## 2014-07-11 ENCOUNTER — Inpatient Hospital Stay (HOSPITAL_COMMUNITY): Payer: Medicare Other | Admitting: Rehabilitation

## 2014-07-11 MED ORDER — PRO-STAT SUGAR FREE PO LIQD
30.0000 mL | Freq: Every day | ORAL | Status: DC
  Administered 2014-07-11: 30 mL via ORAL
  Filled 2014-07-11 (×3): qty 30

## 2014-07-11 NOTE — Progress Notes (Signed)
Speech Language Pathology Daily Session Note  Patient Details  Name: [name] MRN: [id number] Date of Birth: Oct 17, 1925  Today's Date: 07/11/2014 SLP Individual Time: 9794-8016 SLP Individual Time Calculation (min): 65 min  Short Term Goals: Week 2: SLP Short Term Goal 1 (Week 2): Patient will consume Dys.2 textures and nectar-thick liquids via cup with Supervision verbal cues to utilize safe swallow strategies to minimize overt s/s of aspiration. SLP Short Term Goal 2 (Week 2): Patient will demonstrate effective mastication of Dys. 3 textures across 2 consecutive sessions with Min verbal cues for portion control and use of lingual sweeps to minimize overt s/s of aspiration. SLP Short Term Goal 3 (Week 2): Patient will demonstrate readiness for repeat objective swallow assessment with improved managment of oral secreations with no more than 5 cues needed to manage secreations in a 30 minute session. SLP Short Term Goal 4 (Week 2): Patient will produce intelligible speech at the phrase level with Min multimodal cues to utilize compensatory strategies. SLP Short Term Goal 5 (Week 2): Patient will initiate basic self-care tasks with no more than 2 verbal cues.    Skilled Therapeutic Interventions: Skilled treatment session focused on addressing dysphagia goals.  SLP facilitated session with set-up of breakfast tray.  Patient consumed Dys.2 textures and nectar-thick liquids via cup with min verbal cues for utilization of safe swallow strategies. Patient demonstrated more timely mastication, less pocketing as well as anterior loss with no overt s/s of aspiration.  Meal was ended with SLP providing set-up of oral care via suctioning and Supervision verbal cues for thoroughness.  SLP initiated education regarding implementation of pharyngeal strengthening exercises; however, patient and family need reinforcement next session.       FIM:  Comprehension Comprehension Mode: Auditory Comprehension:  5-Understands basic 90% of the time/requires cueing < 10% of the time Expression Expression Mode: Verbal Expression: 3-Expresses basic 50 - 74% of the time/requires cueing 25 - 50% of the time. Needs to repeat parts of sentences. Social Interaction Social Interaction: 4-Interacts appropriately 75 - 89% of the time - Needs redirection for appropriate language or to initiate interaction. Problem Solving Problem Solving: 3-Solves basic 50 - 74% of the time/requires cueing 25 - 49% of the time Memory Memory: 3-Recognizes or recalls 50 - 74% of the time/requires cueing 25 - 49% of the time FIM - Eating Eating Activity: 5: Set-up assist for open containers;5: Needs verbal cues/supervision;4: Helper occasionally scoops food on utensil;4: Helper checks for pocketed food  Pain Pain Assessment Pain Assessment: No/denies pain Pain Score: 0-No pain  Therapy/Group: Individual Therapy  Carmelia Roller., [place] [phone]  [place] 07/11/2014, 4:01 PM

## 2014-07-11 NOTE — Progress Notes (Signed)
Occupational Therapy Weekly Progress Note  Patient Details  Name: [name] MRN: [id number] Date of Birth: July 06, 1926  Beginning of progress report period: July 03, 2014 End of progress report period: July 11, 2014  Today's Date: 07/11/2014 OT Individual Time: 5366-4403 OT Individual Time Calculation (min): 60 min    Patient has met 1 of 4 short term goals.  Pt making slow progress this week towards short and long term goals. Pt continued to require +2 assist for some self care tasks such as toileting and LB dressing. Pt with increased pusher tendencies this week as well as difficulty with weight shifting/postitioning in wheelchair. His Wife has been present during sessions for education but unable to physically assist with therapy. Wife reports wanting to return pt to home at discharge and she will likely need 24/7 to assist physically with pt upon discharge. Continue towards OT POC.  Patient continues to demonstrate the following deficits: decreased sitting balance, decreased standing balance, decreased postural control, decreased strength/AROM/ and functional use of R UE/LE, decreased attention and initiation, pusher tendencies during transfers, and decreased self care and therefore will continue to benefit from skilled OT intervention to enhance overall performance with BADL.  Patient progressing toward long term goals..  Continue plan of care.  OT Short Term Goals Week 1:  OT Short Term Goal 1 (Week 1): Pt will complete toilet transfer with max assist +1 OT Short Term Goal 1 - Progress (Week 1): Progressing toward goal OT Short Term Goal 2 (Week 1): Pt will complete LB dressing with max assist +1 OT Short Term Goal 2 - Progress (Week 1): Progressing toward goal OT Short Term Goal 3 (Week 1): Pt will complete UB dressing with max assist  OT Short Term Goal 3 - Progress (Week 1): Met OT Short Term Goal 4 (Week 1): Pt will sit unsupported during functional task for 2 min with  min assist  OT Short Term Goal 4 - Progress (Week 1): Progressing toward goal Week 2:  OT Short Term Goal 1 (Week 2): Pt will peform UB dressing with Mod A in order to increase I in self care. OT Short Term Goal 2 (Week 2): Pt will perform shower transfer with Max A of 1 in order to increase i in self care. OT Short Term Goal 3 (Week 2): Pt will perform grooming with min A in order to increase I in self care. OT Short Term Goal 4 (Week 2): Pt will perform LB dressing with Max A of 1 in order to increase i in self care.   Skilled Therapeutic Interventions/Progress Updates:  Upon entering room, pt seated in wheelchair with wife present and no c/o pain. Wife did not stay for B & D session today. Pt required coaxing for showering this session and having decreased motivation this session. OT recommends visit from neuropsych is possible. Transfer to L with Max A from wheelchair > roll in shower chair. Bathing in shower chair with Mod A this session. Pt requiring total A as pt required second helper for LB dressing tasks. Pt requiring total A for weight shifting and positioning into wheelchair. Grooming performed while seated at sink side with verbal cues for pt to get head up while looking in mirror during tasks. Pt seated in wheelchair with QRB and arm tray donned with call bell within reach.   Therapy Documentation Precautions:  Precautions Precautions: Fall Precaution Comments: R hemiplegia, R gaze preference Restrictions Weight Bearing Restrictions: No  See FIM for current functional  status  Therapy/Group: Individual Therapy  [name] 07/11/2014, 11:07 AM

## 2014-07-11 NOTE — Progress Notes (Signed)
Occupational Therapy Session Note  Patient Details  Name: [name] MRN: [id number] Date of Birth: [birth year]  Today's Date: 07/11/2014 OT Co-Treatment Time: 1300-1330 OT Co-Treatment Time Calculation (min): 30 min (of a 60 minute session)  Short Term Goals: Week 2:  OT Short Term Goal 1 (Week 2): Pt will peform UB dressing with Mod A in order to increase I in self care. OT Short Term Goal 2 (Week 2): Pt will perform shower transfer with Max A of 1 in order to increase i in self care. OT Short Term Goal 3 (Week 2): Pt will perform grooming with min A in order to increase I in self care. OT Short Term Goal 4 (Week 2): Pt will perform LB dressing with Max A of 1 in order to increase i in self care.   Skilled Therapeutic Interventions/Progress Updates:  Patient resting in w/c upon arrival with wife and daughter present.  Engaged in stretching of upper thoracic, mid thoracic, lower thoracic and pelvis while seated EOM, postural control, midline orientation, anterior and lateral weight shifts to prepare for transitional movements to include squat pivot transfers, sit><stands static and dynamic standing and ambulating.  Patient presents as fearful of movement and responds positively with assistance only after he has initiated the movement.  Please refer to PT note for more details.  Therapy Documentation Precautions:  Precautions Precautions: Fall Precaution Comments: R hemiplegia, R gaze preference Restrictions Weight Bearing Restrictions: No Pain: Pain Assessment Pain Score: 0-No pain ADL: See FIM for current functional status  Therapy/Group: Co-Treatment with PT  Raven Furnas 07/11/2014, 2:26 PM

## 2014-07-11 NOTE — Progress Notes (Signed)
Subjective/Complaints: 80 y.o. left handed male with history of HTN, prostate cancer, recent PNA with malaise X 1 week who was admitted on 06/27/14 with right sided weakness with right gaze preference and difficulty talking. Patient was noted to be in A fib in setting of CAP and CT head without acute changes. EKG evaluated by cardiology and felt to have multiple artifacts with 1 degree AVB and repeat EKG with NSR. NIHSS- 14. 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   No fever or cough, talked about daughter who is artist  Review of Systems - Negative except weakness on right side Objective: Vital Signs: Blood pressure 155/65, pulse 64, temperature 97.6 F (36.4 C), temperature source Oral, resp. rate 18, height 5\' 9"  (1.753 m), weight 62.6 kg (138 lb 0.1 oz), SpO2 95 %. Dg Chest 2 View  07/09/2014   CLINICAL DATA:  malaise for 1 week. Recent pneumonia. History of prostate carcinoma and hypertension  EXAM: CHEST  2 VIEW  COMPARISON:  July 02, 2014  FINDINGS: There is slightly less infiltrate in the right lower lobe posteriorly and medially compared to recent prior study. Moderate infiltrate remains in these areas, however. There is mild atelectatic change in the left base. There is apical pleural thickening bilaterally which is stable. No new opacity is seen. The heart size and pulmonary vascularity are normal. No adenopathy. There is degenerative change in the thoracic spine. There is slight anterior wedging of a mid thoracic vertebral body, stable. Atherosclerotic change in the aorta is again noted.  IMPRESSION: Slightly less infiltrate in the right lower lobe compared to recent prior  study. Stable mild atelectatic change in the left base as well as stable bilateral apical pleural thickening bilaterally. No new opacity.   Electronically Signed   By: Lowella Grip M.D.   On: 07/09/2014 11:09   Results for orders placed or performed during the hospital encounter of 07/02/14 (from the past 72 hour(s))  CBC with Differential     Status: Abnormal   Collection Time: 07/09/14  8:49 AM  Result Value Ref Range   WBC 12.0 (H) 4.0 - 10.5 K/uL   RBC 4.34 4.22 - 5.81 MIL/uL   Hemoglobin 12.4 (L) 13.0 - 17.0 g/dL   HCT 37.5 (L) 39.0 - 52.0 %   MCV 86.4 78.0 - 100.0 fL   MCH 28.6 26.0 - 34.0 pg   MCHC 33.1 30.0 - 36.0 g/dL   RDW 12.8 11.5 - 15.5 %   Platelets 392 150 - 400 K/uL   Neutrophils Relative % 79 (H) 43 - 77 %   Neutro Abs 9.6 (H) 1.7 - 7.7 K/uL   Lymphocytes Relative 12 12 - 46 %   Lymphs Abs 1.4 0.7 - 4.0 K/uL   Monocytes Relative 8 3 - 12 %   Monocytes Absolute 0.9 0.1 - 1.0 K/uL   Eosinophils Relative 1 0 - 5 %   Eosinophils Absolute 0.1 0.0 - 0.7 K/uL   Basophils Relative 0 0 - 1 %   Basophils Absolute 0.0 0.0 - 0.1 K/uL     HEENT: normal Cardio: RRR Resp: CTA B/L GI: BS positive and NT,ND Extremity:  Edema Right hand and Right foot Skin:   Wound vertex of scalp, eschar Neuro:  Lethargic, Cranial Nerve Abnormalities Right central 7, Abnormal Sensory reduced on left side to LT, Abnormal Motor 0/5 on right side except trace Right foot plantar flexion and Tone:  Hypotonia Musc/Skel:  Other no pain with Right shoulder ROM Gen NAD   Assessment/Plan: 1. Functional deficits secondary to left paramedian pontine infarct which require 3+ hours per day of interdisciplinary therapy in a comprehensive inpatient rehab setting. Physiatrist is providing close team supervision and 24 hour management of active medical problems listed below. Physiatrist and rehab team continue to assess barriers to discharge/monitor patient progress toward functional and medical  goals. Marland KitchenFIM: FIM - Bathing Bathing Steps Patient Completed: Chest, Right Arm, Abdomen, Right upper leg, Left upper leg Bathing: 3: Mod-Patient completes 5-7 38f 10 parts or 50-74%  FIM - Upper Body Dressing/Undressing Upper body dressing/undressing steps patient completed: Put head through opening of pull over shirt/dress Upper body dressing/undressing: 2: Max-Patient completed 25-49% of tasks FIM - Lower Body Dressing/Undressing Lower body dressing/undressing steps patient completed: Thread/unthread left pants leg Lower body dressing/undressing: 1: Two helpers (sit > stand)  FIM - Toileting Toileting: 1: Total-Patient completed zero steps, helper did all 3  FIM - Radio producer Devices: Bedside commode Toilet Transfers: 1-From toilet/BSC: Total A (helper does all/Pt. < 25%)  FIM - Bed/Chair Transfer Bed/Chair Transfer Assistive Devices: Arm rests Bed/Chair Transfer: 2: Supine > Sit: Max A (lifting assist/Pt. 25-49%), 1: Two helpers  FIM - Locomotion: Wheelchair Distance: 125' Locomotion: Wheelchair: 0: Activity did not occur FIM - Locomotion: Ambulation Locomotion: Ambulation Assistive Devices: Other (comment) (L handrail in hallway) Ambulation/Gait Assistance: 1: +2 Total assist Locomotion: Ambulation: 0: Activity did not occur  Comprehension Comprehension Mode: Auditory Comprehension: 5-Understands basic 90% of the time/requires cueing < 10% of the time  Expression Expression Mode: Verbal Expression: 3-Expresses basic 50 - 74% of the time/requires cueing 25 - 50% of the time. Needs to repeat parts of sentences.  Social Interaction Social Interaction: 4-Interacts appropriately 75 - 89% of the time - Needs redirection for appropriate language or to initiate interaction.  Problem Solving Problem Solving: 3-Solves basic 50 - 74% of the time/requires cueing 25 - 49% of the time  Memory Memory: 3-Recognizes or recalls 50 - 74% of the  time/requires cueing 25 - 49% of the time   Medical Problem List and Plan: 1. Functional deficits secondary to left paramedian pontine infarct 2. DVT Prophylaxis/Anticoagulation: Pharmaceutical: Lovenox 3. Pain Management: N/A 4. Mood: LCSW to follow for evaluation and support.  5. Neuropsych: This patient is not capable of making decisions on his own behalf. 6. Skin/Wound Care:hx melanoma removal scalp, resume vaseline, family to bring in name of cream from dermatologist Routine pressure relief measures. Turn patient every 2 hours when in bed.  7. Fluids/Electrolytes/Nutrition: Monitor I/O. Check daily weights to monitor for signs of overload.  8. CAP: followup CXR improved , WBCs normalizing as well , D/C abx9. Acute renal insufficiency: Improved. Off HCTZ at this time. 10. HTN: Will monitor every 8 hours and allow permissive HTN for adequate perfusion. 11.  leucocytosis: secondary to PNA . Follow temp curve for now.  12. Thrush: Will treat with short course of diflucan. Not complaining of mouth pain 13.  Nausea, suspect augmentin, resolved off abx LOS (Days) 9 A FACE TO FACE EVALUATION WAS PERFORMED  Rosmarie Esquibel E 07/11/2014, 7:46 AM

## 2014-07-11 NOTE — Progress Notes (Signed)
NUTRITION FOLLOW UP  INTERVENTION: Provide Magic cup TID between meals, each supplement provides 290 kcal and 9 grams of protein. Ordered.  Continue Ensure Pudding po TID, each supplement provides 170 kcal and 4 grams of protein.  Provide 30 ml Prostat po once daily, each supplement provides 100 kcal and 15 grams of protein.  Encourage adequate PO intake.   NUTRITION DIAGNOSIS: Increased nutrient needs related to chronic illness as evidenced by estimated nutrition needs; ongoing  Goal: Pt to meet >/= 90% of their estimated nutrition needs; met  Monitor:  PO intake, weight trends, labs, I/O's  80 y.o. male  Admitting Dx: Left pontine stroke  ASSESSMENT: Pt with history of HTN, prostate cancer, recent PNA with malaise X 1 week who was admitted on 06/27/14 with right sided weakness with right gaze preference and difficulty talking. Patient was noted to be in A fib in setting of CAP. EKG evaluated by cardiology and felt to have multiple artifacts with 1 degree AVB and repeat EKG with NSR.   Meal completion has recently been 0-50%. Pt has been advanced to a dysphagia 2 diet with continued nectar thick liquids. RD to order Prostat to aid in additional calories and protein. Will also continue Magic cups and Ensure pudding. Encourage adequate PO intake.   Height: Ht Readings from Last 1 Encounters:  07/03/14 5' 9"  (1.753 m)    Weight: Wt Readings from Last 1 Encounters:  07/10/14 138 lb 0.1 oz (62.6 kg)    BMI:  Body mass index is 20.37 kg/(m^2).  Re-Estimated Nutritional Needs: Kcal: 1750-1950 Protein: 80-90 grams Fluid: 1.75- 1.95 L/day  Skin: wound on head, non-pitting RLE edema  Diet Order: DIET DYS 2 with nectar thick liquids   Intake/Output Summary (Last 24 hours) at 07/11/14 1522 Last data filed at 07/11/14 1457  Gross per 24 hour  Intake    135 ml  Output    300 ml  Net   -165 ml    Last BM: 12/14  Labs:   Recent Labs Lab 07/06/14 0633  NA 137  K  3.9  CL 103  CO2 22  BUN 20  CREATININE 0.67  CALCIUM 8.2*  GLUCOSE 91    CBG (last 3)  No results for input(s): GLUCAP in the last 72 hours.  Scheduled Meds: . antiseptic oral rinse  7 mL Mouth Rinse QID  . aspirin  325 mg Oral Daily  . atorvastatin  20 mg Oral q1800  . benzonatate  100 mg Oral TID  . enoxaparin (LOVENOX) injection  40 mg Subcutaneous Q24H  . feeding supplement (ENSURE)  1 Container Oral TID WC  . magic mouthwash  10 mL Oral TID  . polycarbophil  625 mg Oral BID  . saccharomyces boulardii  250 mg Oral BID  . white petrolatum   Topical BID    Continuous Infusions: . sodium chloride Stopped (07/11/14 0601)    Past Medical History  Diagnosis Date  . Hypertension   . Cancer     Past Surgical History  Procedure Laterality Date  . Appendectomy    . Kidney surgery      Kallie Locks, MS, RD, LDN Pager # [phone] After hours/ weekend pager # [phone]

## 2014-07-11 NOTE — Progress Notes (Signed)
Physical Therapy Session Note  Patient Details  Name: [name] MRN: [id number] Date of Birth: 26-May-1926  Today's Date: 07/11/2014 PT Co-Treatment Time: 1330 (whole session 1400-1500 with OT)-1400 PT Co-Treatment Time Calculation (min): 30 min  Short Term Goals: Week 2:  PT Short Term Goal 1 (Week 2): Pt to perform bed mobility with mod Ax1person PT Short Term Goal 2 (Week 2): Pt to perform bed<>w/c transfer with mod Ax1person PT Short Term Goal 3 (Week 2): Pt to maintain dynamic sitting balance with min Ax1person PT Short Term Goal 4 (Week 2): Pt to propel w/c 4' with mod Ax1person PT Short Term Goal 5 (Week 2): Pt to ambulate 25' with LRAD and Ax2persons  Skilled Therapeutic Interventions/Progress Updates:   Pt received sitting in w/c in room, agreeable to therapy session.  Skilled co-tx with OT in order to focus on upright posture, midline orientation, stretching to pectorals and hips in sitting, standing balance with weight shifting and NMR through RLE, and short distance gait via "three muskateer style" and functional transfers/bed mobility for nurse orientation transfer training.  Pt assisted to/from therapy gym and transferred w/c>mat and w/c>bed via squat pivot using bobath method for increased forward weight shift and increased WB through LEs.  Performed with +2 assist with nurse assisting posteriorly to start with and then anteriorly back in room for increased carryover and safety with transfers.  Cues for pt for w/c set up and removal of parts prior to transfer.  Also provided max A for scooting forward in w/c prior to transfer.  While on therapy mat, PT providing assist in front of pt with sheet at hips for anterior pelvic tilt while OT provided assist behind pt for upright head and chest posture progressing to OT sitting on physioball providing trunk extension.  Elevated mat to decrease amount of pushing through LLE while PT blocked LEs to prevent forward translation of hips.   Progressed to squatted standing task weight shifting R and L via "three muskateer style" then positioned in front of mirror for increased visual feedback for upright posture.  Pt needing cues and facilitation for R hip and knee extension throughout, but did note that he does have R quad activation.  Ended session with short distance gait x 10' with PT donning R reaction AFO and surgical shoe cover for increased RLE clearance.  Again, requires assist to fully advance RLE, maintain upright posture, stabilize RLE, and facilitate increase hip extension/anterior pelvic tilt.  Pt became very fatigued quickly, needing to return to chair.  Assisted back to room and to bed.  Left in bed with HOB elevate to eat.  RN and tech notified and needing full S for meals.  Bed alarm set and family present.    Therapy Documentation Precautions:  Precautions Precautions: Fall Precaution Comments: R hemiplegia, R gaze preference Restrictions Weight Bearing Restrictions: No   Vital Signs: Therapy Vitals Temp: 97.6 F (36.4 C) Temp Source: Oral Pulse Rate: 71 Resp: 18 BP: (!) 140/59 mmHg Patient Position (if appropriate): Sitting Oxygen Therapy SpO2: 97 % Pain: Pain Assessment Pain Assessment: No/denies pain Pain Score: 0-No pain  See FIM for current functional status  Therapy/Group: Individual Therapy  [name] 07/11/2014, 4:19 PM

## 2014-07-11 NOTE — Patient Care Conference (Signed)
Inpatient RehabilitationTeam Conference and Plan of Care Update Date: 07/11/2014   Time: 12;00 Pm    Patient Name: [name]      Medical Record Number: [id number]  Date of Birth: Jul 14, 1926 Sex: Male         Room/Bed: 4W18C/4W18C-01 Payor Info: Payor: MEDICARE / Plan: MEDICARE PART A AND B / Product Type: *No Product type* /    Admitting Diagnosis: CVA  Admit Date/Time:  07/02/2014  5:47 PM Admission Comments: No comment available   Primary Diagnosis:  Left pontine stroke Principal Problem: Left pontine stroke  Patient Active Problem List   Diagnosis Date Noted  . Right hemiparesis 07/05/2014  . Dysphagia, post-stroke 07/05/2014  . Dysarthria due to recent cerebral infarction 07/05/2014  . Left pontine stroke 07/02/2014  . Embolic stroke involving left carotid artery 06/28/2014  . Stroke 06/28/2014  . CAP (community acquired pneumonia) 06/28/2014  . Normocytic anemia 06/28/2014  . Essential hypertension 06/28/2014  . Hypokalemia 06/28/2014    Expected Discharge Date: Expected Discharge Date: 07/30/14  Team Members Present: Physician leading conference: Dr. Alysia Penna Social Worker Present: Ovidio Kin, LCSW Nurse Present: Heather Roberts, RN PT Present: Carney Living, PT;Emily Parcell, Cecille Rubin, PT OT Present: Clyda Greener, Rhetta Mura, OT SLP Present: Windell Moulding, SLP PPS Coordinator present : Daiva Nakayama, RN, CRRN     Current Status/Progress Goal Weekly Team Focus  Medical   Some RLE extension, PNA resolved, Diarrhea resolved  medical stability  Cont rehab   Bowel/Bladder   iNCONTINENT TO BOWEL AND BLADDER.Last BM 12/14  To monitor bladder and bladder function Q shift.  Timed toilitting Q 2 hrs.   Swallow/Nutrition/ Hydration   Dys. 2 textures and nectar-thick liquids with Min assist   least restrictive PO intake with Min assist   trials of ice chips   ADL's   Max assist squat pivot transfer, +2 for clothing management and bathing at sit >  stand level, mod-total assist for self-care tasks  Min A overall  Rt attention, transfers, Pt and family education, positioning of RUE and use as stabilizer   Mobility   Max A bed mobiltiy, max A for squat pivot transfers, +2 for dynamic standing, +2 for gait  Min A overall  R NMR, attention to the R, w/c mobility, bed mobility, transfers, gait, standing and sitting balance, postural control, pt/family education.    Communication   Mod assist   Min assist   continue to increase breath support and vocal intensity    Safety/Cognition/ Behavioral Observations  Min-Mod assist   Supervision   increase initiation and endurance   Pain   Denies pain  Pain level less than 2,on scale 1 to 10  To monitor pain levels Q 2 hrs.   Skin   Scab to scalp dry and intact.  No new skin breakdown while in rehab  Assess skin condition Q shift      *See Care Plan and progress notes for long and short-term goals.  Barriers to Discharge: wife limited in caregiving ability    Possible Resolutions to Barriers:  ?hired caregiver    Discharge Planning/Teaching Needs:  Family working on hiring caregivers for discharge home, want him home.  Wife here daily and participates in care      Team Discussion:  Getting return in R-UE.  Issues with nausea from antibiotics-dc. Pneumonia cleared. Po intake poor not feeling good. Hips and pelvis tight limit movement. Making progress slowly.  Revisions to Treatment Plan:  None  Continued Need for Acute Rehabilitation Level of Care: The patient requires daily medical management by a physician with specialized training in physical medicine and rehabilitation for the following conditions: Daily direction of a multidisciplinary physical rehabilitation program to ensure safe treatment while eliciting the highest outcome that is of practical value to the patient.: Yes Daily medical management of patient stability for increased activity during participation in an intensive  rehabilitation regime.: Yes Daily analysis of laboratory values and/or radiology reports with any subsequent need for medication adjustment of medical intervention for : Neurological problems;Other  [name], [name] 07/12/2014, 11:54 AM

## 2014-07-11 NOTE — Progress Notes (Signed)
Social Work Patient ID: [name], male   DOB: 08-25-1925, 80 y.o.   MRN: [id number] Met with pt and wife to discuss team conference progression toward goals and discharge 1/4 still.  Pt has had a rough week with nausea and illness from antibiotics. He is eating better today and wife feels he is on the upswing.  She is concerned about his progress and movement.  She is here daily and see's him in therapies. Will continue to work on discharge plans.

## 2014-07-12 ENCOUNTER — Inpatient Hospital Stay (HOSPITAL_COMMUNITY): Payer: Medicare Other | Admitting: Speech Pathology

## 2014-07-12 ENCOUNTER — Inpatient Hospital Stay (HOSPITAL_COMMUNITY): Payer: Medicare Other | Admitting: Rehabilitation

## 2014-07-12 ENCOUNTER — Inpatient Hospital Stay (HOSPITAL_COMMUNITY): Payer: Medicare Other | Admitting: Occupational Therapy

## 2014-07-12 NOTE — Progress Notes (Signed)
Social Work Elease Hashimoto, LCSW Social Worker Signed  Patient Care Conference 07/11/2014  2:50 PM    Expand All Collapse All   Inpatient RehabilitationTeam Conference and Plan of Care Update Date: 07/11/2014   Time: 12;00 Pm     Patient Name: [name]       Medical Record Number: [id number]  Date of Birth: [birth year] Sex: Male         Room/Bed: 4W18C/4W18C-01 Payor Info: Payor: MEDICARE / Plan: MEDICARE PART A AND B / Product Type: *No Product type* /    Admitting Diagnosis: CVA  Admit Date/Time:  07/02/2014  5:47 PM Admission Comments: No comment available   Primary Diagnosis:  Left pontine stroke Principal Problem: Left pontine stroke    Patient Active Problem List     Diagnosis  Date Noted   .  Right hemiparesis  07/05/2014   .  Dysphagia, post-stroke  07/05/2014   .  Dysarthria due to recent cerebral infarction  07/05/2014   .  Left pontine stroke  07/02/2014   .  Embolic stroke involving left carotid artery  06/28/2014   .  Stroke  06/28/2014   .  CAP (community acquired pneumonia)  06/28/2014   .  Normocytic anemia  06/28/2014   .  Essential hypertension  06/28/2014   .  Hypokalemia  06/28/2014     Expected Discharge Date: Expected Discharge Date: 07/30/14  Team Members Present: Physician leading conference: Dr. Alysia Penna Social Worker Present: Ovidio Kin, LCSW Nurse Present: Heather Roberts, RN PT Present: Carney Living, PT;Emily Parcell, Cecille Rubin, PT OT Present: Clyda Greener, Rhetta Mura, OT SLP Present: Windell Moulding, SLP PPS Coordinator present : Daiva Nakayama, RN, CRRN        Current Status/Progress  Goal  Weekly Team Focus   Medical     Some RLE extension, PNA resolved, Diarrhea resolved   medical stability  Cont rehab   Bowel/Bladder     iNCONTINENT TO BOWEL AND BLADDER.Last BM 12/14   To monitor bladder and bladder function Q shift.   Timed toilitting Q 2 hrs.   Swallow/Nutrition/ Hydration     Dys. 2 textures and nectar-thick  liquids with Min assist    least restrictive PO intake with Min assist   trials of ice chips   ADL's     Max assist squat pivot transfer, +2 for clothing management and bathing at sit > stand level, mod-total assist for self-care tasks   Min A overall  Rt attention, transfers, Pt and family education, positioning of RUE and use as stabilizer   Mobility     Max A bed mobiltiy, max A for squat pivot transfers, +2 for dynamic standing, +2 for gait  Min A overall  R NMR, attention to the R, w/c mobility, bed mobility, transfers, gait, standing and sitting balance, postural control, pt/family education.    Communication     Mod assist   Min assist   continue to increase breath support and vocal intensity    Safety/Cognition/ Behavioral Observations    Min-Mod assist   Supervision   increase initiation and endurance   Pain     Denies pain  Pain level less than 2,on scale 1 to 10   To monitor pain levels Q 2 hrs.   Skin     Scab to scalp dry and intact.  No new skin breakdown while in rehab   Assess skin condition Q shift      *See Care Plan and progress  notes for long and short-term goals.    Barriers to Discharge:  wife limited in caregiving ability      Possible Resolutions to Barriers:   ?hired caregiver     Discharge Planning/Teaching Needs:   Family working on hiring caregivers for discharge home, want him home.  Wife here daily and participates in care        Team Discussion:    Getting return in R-UE.  Issues with nausea from antibiotics-dc. Pneumonia cleared. Po intake poor not feeling good. Hips and pelvis tight limit movement. Making progress slowly.   Revisions to Treatment Plan:    None    Continued Need for Acute Rehabilitation Level of Care: The patient requires daily medical management by a physician with specialized training in physical medicine and rehabilitation for the following conditions: Daily direction of a multidisciplinary physical rehabilitation program to  ensure safe treatment while eliciting the highest outcome that is of practical value to the patient.: Yes Daily medical management of patient stability for increased activity during participation in an intensive rehabilitation regime.: Yes Daily analysis of laboratory values and/or radiology reports with any subsequent need for medication adjustment of medical intervention for : Neurological problems;Other  Elease Hashimoto 07/12/2014, 11:54 AM                 Elease Hashimoto, LCSW Social Worker Signed  Patient Care Conference 07/04/2014  2:53 PM    Expand All Collapse All   Inpatient RehabilitationTeam Conference and Plan of Care Update Date: 07/04/2014   Time: 11;45 AM     Patient Name: [name]       Medical Record Number: [id number]  Date of Birth: 11-24-1925 Sex: Male         Room/Bed: 4W18C/4W18C-01 Payor Info: Payor: MEDICARE / Plan: MEDICARE PART A AND B / Product Type: *No Product type* /    Admitting Diagnosis: CVA  Admit Date/Time:  07/02/2014  5:47 PM Admission Comments: No comment available   Primary Diagnosis:  Left pontine stroke Principal Problem: Left pontine stroke    Patient Active Problem List     Diagnosis  Date Noted   .  Right hemiparesis  07/05/2014   .  Dysphagia, post-stroke  07/05/2014   .  Dysarthria due to recent cerebral infarction  07/05/2014   .  Left pontine stroke  07/02/2014   .  Embolic stroke involving left carotid artery  06/28/2014   .  Stroke  06/28/2014   .  CAP (community acquired pneumonia)  06/28/2014   .  Normocytic anemia  06/28/2014   .  Essential hypertension  06/28/2014   .  Hypokalemia  06/28/2014     Expected Discharge Date: Expected Discharge Date: 07/30/14  Team Members Present: Physician leading conference: Dr. Alysia Penna Nurse Present: Rayetta Pigg, RN PT Present: Carney Living, Jimmie Molly, PT OT Present: Clyda Greener, Rhetta Mura, OT SLP Present: Gunnar Fusi, SLP PPS Coordinator  present : Daiva Nakayama, RN, CRRN        Current Status/Progress  Goal  Weekly Team Focus   Medical     right flaccid hemiparesis, PNA resolving, diarrhea from abx  maintain med stability  initiate rehab program   Bowel/Bladder     Incontinent of bowel and bladder. LBM 07/03/14 x3  Mod assist   Timed toileting q2-3 hours   Swallow/Nutrition/ Hydration     Dys. 1 textures and nectar-thiclks liquids with Max assist    least restrictive PO intake with Min  assist   increase endurance and PO intake    ADL's     Max A for bed mobility, +2 for STS for clothing management, max A squat pivot transfers, Mod  - total A for self care tasks   Min A overall  Pt education, attention to R, functional transfers/mobility, pt and family education, safety awareness, use of R UE as stabilitzer.    Mobility     max A for bed mobility, max A squat pivot transfers, +2 for standing, gait and stairs not performed yet due to safety.    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    Barriers to Discharge:  elderly wife who uses cane     Possible Resolutions to Barriers:   ? if daughter can assist post d/c     Discharge Planning/Teaching Needs:   Based upon goals and pt's progress-wife can only provide supervision level, discussing hired assist and/or NHP        Team  Discussion:    Goals-min level-inattention to right and flaccid.  Wants to go home and will hire assistance.  Concerned about his hiccups-MD addressing. Pleased with progress since yesterday.   Revisions to Treatment Plan:    None    Continued Need for Acute Rehabilitation Level of Care: The patient requires daily medical management by a physician with specialized training in physical medicine and rehabilitation for the following conditions: Daily direction of a multidisciplinary physical rehabilitation program to ensure safe treatment while eliciting the highest outcome that is of practical value to the patient.: Yes Daily medical management of patient stability for increased activity during participation in an intensive rehabilitation regime.: Yes Daily analysis of laboratory values and/or radiology reports with any subsequent need for medication adjustment of medical intervention for : Neurological problems  Elease Hashimoto 07/05/2014, 8:38 AM                  Patient ID: Lorrene Reid, male   DOB: 14-Nov-1925, 80 y.o.   MRN: [id number]

## 2014-07-12 NOTE — Progress Notes (Signed)
Speech Language Pathology Daily Session Note  Patient Details  Name: [name] MRN: [id number] Date of Birth: Jun 22, 1926  Today's Date: 07/12/2014 SLP Individual Time: 0830-0930 SLP Individual Time Calculation (min): 60 min  Short Term Goals: Week 2: SLP Short Term Goal 1 (Week 2): Patient will consume Dys.2 textures and nectar-thick liquids via cup with Supervision verbal cues to utilize safe swallow strategies to minimize overt s/s of aspiration. SLP Short Term Goal 2 (Week 2): Patient will demonstrate effective mastication of Dys. 3 textures across 2 consecutive sessions with Min verbal cues for portion control and use of lingual sweeps to minimize overt s/s of aspiration. SLP Short Term Goal 3 (Week 2): Patient will demonstrate readiness for repeat objective swallow assessment with improved managment of oral secreations with no more than 5 cues needed to manage secreations in a 30 minute session. SLP Short Term Goal 4 (Week 2): Patient will produce intelligible speech at the phrase level with Min multimodal cues to utilize compensatory strategies. SLP Short Term Goal 5 (Week 2): Patient will initiate basic self-care tasks with no more than 2 verbal cues.    Skilled Therapeutic Interventions: Skilled treatment session focused on addressing dysphagia goals.  SLP facilitated session with set-up of breakfast tray.  Patient consumed Dys.2 textures and nectar-thick liquids via cup with Min verbal cues for utilization of small portions at a slow pace with intermittent lingual/manual sweep to manage pocketing.  Patient demonstrated more timely mastication today, fewer instances of pocketing as well as anterior loss with no overt s/s of aspiration.  SLP faded cues throughout session to allow spouse to return demonstration of cuing strategies, which she did Independently.  As a result, spouse signed off to supervise meals.  SLP continued education regarding pharyngeal strengthening exercises; patient  and spouse verbalized understanding of information.     FIM:  Comprehension Comprehension Mode: Auditory Comprehension: 5-Understands basic 90% of the time/requires cueing < 10% of the time Expression Expression Mode: Verbal Expression: 3-Expresses basic 50 - 74% of the time/requires cueing 25 - 50% of the time. Needs to repeat parts of sentences. Social Interaction Social Interaction: 4-Interacts appropriately 75 - 89% of the time - Needs redirection for appropriate language or to initiate interaction. Problem Solving Problem Solving: 3-Solves basic 50 - 74% of the time/requires cueing 25 - 49% of the time Memory Memory: 3-Recognizes or recalls 50 - 74% of the time/requires cueing 25 - 49% of the time FIM - Eating Eating Activity: 5: Set-up assist for open containers;5: Needs verbal cues/supervision;4: Helper occasionally scoops food on utensil;4: Helper checks for pocketed food  Pain Pain Assessment Pain Assessment: No/denies pain  Therapy/Group: Individual Therapy  Carmelia Roller., [place] [phone]  [place] 07/12/2014, 11:46 AM

## 2014-07-12 NOTE — Progress Notes (Signed)
Occupational Therapy Session Note  Patient Details  Name: [name] MRN: [id number] Date of Birth: 16-Jul-1926  Today's Date: 07/12/2014 OT Co-Treatment Time: 1357-1430 Co treatment with PT ( total time 1357-1500) OT Co-Treatment Time Calculation (min): 33 min   Short Term Goals: Week 2:  OT Short Term Goal 1 (Week 2): Pt will peform UB dressing with Mod A in order to increase I in self care. OT Short Term Goal 2 (Week 2): Pt will perform shower transfer with Max A of 1 in order to increase i in self care. OT Short Term Goal 3 (Week 2): Pt will perform grooming with min A in order to increase I in self care. OT Short Term Goal 4 (Week 2): Pt will perform LB dressing with Max A of 1 in order to increase i in self care.   Skilled Therapeutic Interventions/Progress Updates:  Upon entering room, pt seated in wheelchair with wife present. Skilled co treatment with PT to include stretching of lumbar, thoracic,cervical spine, and PROM to R UE in all planes.Session also addressing anterior pelvic tilt, pt facilitation of upright posture and neutral head position with increased postural control. Transfer to mat from wheelchair with Mod A squat pivot transfer with pt initiating movement to the R. Stretching performed while pt laying on small wedge with OT addressing UE while PT stretching B LEs. Pt initially requiring assist for upright posture with sheet around hips and stretch posteriorly with therapy ball. As exercise progressed, pt with several bouts of increased activation with task.SaraPlus utilized with walking sling x 20 feet with +2 assist. R hand placement maintained with theraband with PT advancing R foot. Pt requiring assist with weight shifting to L with gait trial with increased difficulty secondary to increasing fatigue. Pt changing wheelchair cushing secondary to redness observed on buttocks during previous OT session. Pt assisted back to room by therapist call bell and all needed items  within reach. His wife was able to observe session in its entirety.   Therapy Documentation Precautions:  Precautions Precautions: Fall Precaution Comments: R hemiplegia, R gaze preference Restrictions Weight Bearing Restrictions: No  Pain: Pain Assessment Pain Assessment: No/denies pain  See FIM for current functional status  Therapy/Group: Co-Treatment  P[place], Ileigh Mettler L 07/12/2014, 9:09 PM

## 2014-07-12 NOTE — Progress Notes (Signed)
Physical Therapy Session Note  Patient Details  Name: [name] MRN: [id number] Date of Birth: Jan 22, 1926  Today's Date: 07/12/2014  Co-treatment Time:  - 1430-1500 (whole session 1400-1500 w/ OT) Duration: 30 mins      Short Term Goals: Week 2:  PT Short Term Goal 1 (Week 2): Pt to perform bed mobility with mod Ax1person PT Short Term Goal 2 (Week 2): Pt to perform bed<>w/c transfer with mod Ax1person PT Short Term Goal 3 (Week 2): Pt to maintain dynamic sitting balance with min Ax1person PT Short Term Goal 4 (Week 2): Pt to propel w/c 67' with mod Ax1person PT Short Term Goal 5 (Week 2): Pt to ambulate 25' with LRAD and Ax2persons  Skilled Therapeutic Interventions/Progress Updates:   Pt received sitting in w/c in room, agreeable to therapy session.  Skilled co-tx with OT in order to address stretching to lumbar, thoracic and cervical spine, increased anterior pelvic tilt, increased pt activation of upright posture and ant pelvic tilt and upright head/neck posture and increased postural control.  Also addressed scooting to carry over to functional transfers and for trunk shortening, as well as gait with use of SaraPlus.  Pt transferred to therapy mat via squat pivot at mod A level to the R.  Good activation of LEs for push off with facilitation for forward weight shift.  Performed stretching as mentioned above while lying on small wedge while PT assisted with B hamstring stretch and glute stretch while OT stretched R arm.  Performed scooting forwards/backwards with focus on single hip at a time for trunk shortening/lengthening, and had pt activate several bouts of upright posture with light assist with sheet around hips and OT providing stretch posteriorly with physioball.  Ended session with trial of gait with SaraPlus with walking sling x 20' with +2 assist.  Utilized theraband to maintain R hand placement with PT assisting for L weight shift and R foot clearance/knee control.  Note that  he is almost able to clear RLE on his own with adequate weight shift to the L, however fatigued quickly.  Switched pts cushion to 16x16 Roho, despite chair being 18x18 (no pressure relieving cushion in that size).  OT and RN stating area of redness on buttocks, therefore switched for better relief.  Pt assisted back to room and left in w/c with all needs in reach, quick release belt donned.   Therapy Documentation Precautions:  Precautions Precautions: Fall Precaution Comments: R hemiplegia, R gaze preference Restrictions Weight Bearing Restrictions: No   Vital Signs: Therapy Vitals Temp: 98.1 F (36.7 C) Temp Source: Oral Pulse Rate: 76 Resp: 17 BP: 133/65 mmHg Patient Position (if appropriate): Sitting Oxygen Therapy SpO2: 97 % O2 Device: Not Delivered Pain: Pt with min pain in R arm with stretching, modified to decrease pain  See FIM for current functional status  Therapy/Group: Co-Treatment  Syndey Jaskolski, Betha Loa 07/12/2014, 5:04 PM

## 2014-07-12 NOTE — Progress Notes (Signed)
Occupational Therapy Session Note  Patient Details  Name: [name] MRN: [id number] Date of Birth: 02-24-1926  Today's Date: 07/12/2014 OT Individual Time: 1000-1100 OT Individual Time Calculation (min): 60 min    Short Term Goals: Week 2:  OT Short Term Goal 1 (Week 2): Pt will peform UB dressing with Mod A in order to increase I in self care. OT Short Term Goal 2 (Week 2): Pt will perform shower transfer with Max A of 1 in order to increase i in self care. OT Short Term Goal 3 (Week 2): Pt will perform grooming with min A in order to increase I in self care. OT Short Term Goal 4 (Week 2): Pt will perform LB dressing with Max A of 1 in order to increase i in self care.   Skilled Therapeutic Interventions/Progress Updates:  Upon entering room, pt seated in wheelchair with wife present in room. No c/o pain this session. His wife, Herbert Pun, was in and out of room during session. Pt requesting B & D at sink level this session secondary to increased fatigue with showering. Skilled OT session with focus on self care, upright posture, anterior weight shifting, standing balance, STS, and pt/family education. Pt requiring less cues for initiation of tasks this session with increased motivation as well. Pt focus on upright posture and head in order to look into mirror for input regarding progress towards tasks and position of self. STS x 3 reps this session with + 2 assist. 1 person required for Max A standing balance with 2nd person to wash buttocks and clothing management. Pt requiring min verbal cues for anterior weight shifting activate by him vs pulling on sink. Pt seated in wheelchair with QRB and arm tray donned. Pt reporting nausea at end of session with RN being notified. Pt left in room with call bell within reach and wife present.  Therapy Documentation Precautions:  Precautions Precautions: Fall Precaution Comments: R hemiplegia, R gaze preference Restrictions Weight Bearing  Restrictions: No   Pain: Pain Assessment Pain Assessment: No/denies pain  See FIM for current functional status  Therapy/Group: Individual Therapy  [name] 07/12/2014, 12:36 PM

## 2014-07-12 NOTE — Progress Notes (Signed)
Subjective/Complaints: 80 y.o. left handed male with history of HTN, prostate cancer, recent PNA with malaise X 1 week who was admitted on 06/27/14 with right sided weakness with right gaze preference and difficulty talking. Patient was noted to be in A fib in setting of CAP and CT head without acute changes. EKG evaluated by cardiology and felt to have multiple artifacts with 1 degree AVB and repeat EKG with NSR. NIHSS- 14. 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   No fever, occ cough, ate food from home  Review of Systems - Negative except weakness on right side Objective: Vital Signs: Blood pressure 135/54, pulse 61, temperature 97.7 F (36.5 C), temperature source Oral, resp. rate 17, height 5\' 9"  (1.753 m), weight 62.2 kg (137 lb 2 oz), SpO2 95 %. No results found. Results for orders placed or performed during the hospital encounter of 07/02/14 (from the past 72 hour(s))  CBC with Differential     Status: Abnormal   Collection Time: 07/09/14  8:49 AM  Result Value Ref Range   WBC 12.0 (H) 4.0 - 10.5 K/uL   RBC 4.34 4.22 - 5.81 MIL/uL   Hemoglobin 12.4 (L) 13.0 - 17.0 g/dL   HCT 37.5 (L) 39.0 - 52.0 %   MCV 86.4 78.0 - 100.0 fL   MCH 28.6 26.0 - 34.0 pg   MCHC 33.1 30.0 - 36.0 g/dL   RDW 12.8 11.5 - 15.5 %   Platelets 392 150 - 400 K/uL   Neutrophils Relative % 79 (H) 43 - 77 %   Neutro Abs 9.6 (H) 1.7 - 7.7 K/uL   Lymphocytes Relative 12 12 - 46 %   Lymphs Abs 1.4 0.7 - 4.0 K/uL   Monocytes Relative 8 3 - 12 %   Monocytes Absolute 0.9 0.1 - 1.0 K/uL   Eosinophils Relative 1 0 - 5 %   Eosinophils Absolute 0.1 0.0 - 0.7 K/uL   Basophils Relative 0 0 - 1 %   Basophils  Absolute 0.0 0.0 - 0.1 K/uL     HEENT: normal, no evidence of thrush Cardio: RRR Resp: CTA B/L GI: BS positive and NT,ND Extremity:  Edema Right hand and Right foot Skin:   Wound vertex of scalp, eschar Neuro: Lethargic, Cranial Nerve Abnormalities Right central 7, Abnormal Sensory reduced on left side to LT, Abnormal Motor 0/5 on right side except trace Right foot plantar flexion and Tone:  Hypotonia Musc/Skel:  Other no pain with Right shoulder ROM Gen NAD   Assessment/Plan: 1. Functional deficits secondary to left paramedian pontine infarct which require 3+ hours per day of interdisciplinary therapy in a comprehensive inpatient rehab setting. Physiatrist is providing close team supervision and 24 hour management of active medical problems listed below. Physiatrist and rehab team continue to assess barriers to discharge/monitor patient progress toward functional and medical goals. Marland KitchenFIM: FIM - Bathing Bathing Steps Patient Completed: Chest, Right Arm, Abdomen, Right upper leg, Left upper leg Bathing: 3: Mod-Patient completes 5-7 32f 10 parts or 50-74%  FIM - Upper Body Dressing/Undressing Upper body dressing/undressing steps patient completed: Put head through opening of pull over shirt/dress Upper body dressing/undressing: 2: Max-Patient completed 25-49% of tasks FIM - Lower Body Dressing/Undressing Lower body dressing/undressing steps patient completed: Thread/unthread  left pants leg Lower body dressing/undressing: 1: Two helpers  FIM - Toileting Toileting: 1: Total-Patient completed zero steps, helper did all 3  FIM - Radio producer Devices: Bedside commode Toilet Transfers: 1-From toilet/BSC: Total A (helper does all/Pt. < 25%)  FIM - Bed/Chair Transfer Bed/Chair Transfer Assistive Devices: Arm rests Bed/Chair Transfer: 1: Two helpers (for nursing training)  FIM - Locomotion: Wheelchair Distance: 125' Locomotion: Wheelchair: 0: Activity did  not occur FIM - Locomotion: Ambulation Locomotion: Ambulation Assistive Devices: Other (comment) (L handrail in hallway) Ambulation/Gait Assistance: 1: +2 Total assist Locomotion: Ambulation: 0: Activity did not occur  Comprehension Comprehension Mode: Auditory Comprehension: 5-Understands basic 90% of the time/requires cueing < 10% of the time  Expression Expression Mode: Verbal Expression: 3-Expresses basic 50 - 74% of the time/requires cueing 25 - 50% of the time. Needs to repeat parts of sentences.  Social Interaction Social Interaction: 4-Interacts appropriately 75 - 89% of the time - Needs redirection for appropriate language or to initiate interaction.  Problem Solving Problem Solving: 3-Solves basic 50 - 74% of the time/requires cueing 25 - 49% of the time  Memory Memory: 3-Recognizes or recalls 50 - 74% of the time/requires cueing 25 - 49% of the time   Medical Problem List and Plan: 1. Functional deficits secondary to left paramedian pontine infarct 2. DVT Prophylaxis/Anticoagulation: Pharmaceutical: Lovenox 3. Pain Management: N/A 4. Mood: LCSW to follow for evaluation and support.  5. Neuropsych: This patient is not capable of making decisions on his own behalf. 6. Skin/Wound Care:hx melanoma removal scalp, resume vaseline, family to bring in name of cream from dermatologist Routine pressure relief measures. Turn patient every 2 hours when in bed.  7. Fluids/Electrolytes/Nutrition: Monitor I/O. Check daily weights to monitor for signs of overload.  8. CAP: followup CXR improved , WBCs normalizing as well , D/C abx                                                                           9. Acute renal insufficiency: Improved. Off HCTZ at this time. 10. HTN: Will monitor every 8 hours and allow permissive HTN for adequate perfusion. 11.  leucocytosis: secondary to PNA . Follow temp curve for now.  12. Thrush: resolved 13.  Poor appetite, not nausea  realated, pt reports eating food brought in by wife LOS (Days) [place] E 07/12/2014, 7:51 AM

## 2014-07-13 ENCOUNTER — Inpatient Hospital Stay (HOSPITAL_COMMUNITY): Payer: Medicare Other | Admitting: Speech Pathology

## 2014-07-13 ENCOUNTER — Ambulatory Visit (HOSPITAL_COMMUNITY): Payer: Medicare Other | Admitting: Rehabilitation

## 2014-07-13 ENCOUNTER — Inpatient Hospital Stay (HOSPITAL_COMMUNITY): Payer: Medicare Other | Admitting: Occupational Therapy

## 2014-07-13 LAB — CREATININE, SERUM
Creatinine, Ser: 0.66 mg/dL (ref 0.50–1.35)
GFR calc Af Amer: >90 mL/min (ref >90)
GFR calc non Af Amer: 85 mL/min — ABNORMAL LOW (ref >90)

## 2014-07-13 MED ORDER — PRO-STAT SUGAR FREE PO LIQD
30.0000 mL | Freq: Two times a day (BID) | ORAL | Status: DC
  Administered 2014-07-14 – 2014-07-17 (×5): 30 mL via ORAL
  Filled 2014-07-13 (×9): qty 30

## 2014-07-13 NOTE — Progress Notes (Signed)
NUTRITION FOLLOW UP  INTERVENTION: Provide Magic cup TID between meals, each supplement provides 290 kcal and 9 grams of protein. Ordered.  Continue Ensure Pudding po TID, each supplement provides 170 kcal and 4 grams of protein.  Provide 30 ml Prostat po BID, each supplement provides 100 kcal and 15 grams of protein.  Encourage adequate PO intake.   NUTRITION DIAGNOSIS: Increased nutrient needs related to chronic illness as evidenced by estimated nutrition needs; ongoing  Goal: Pt to meet >/= 90% of their estimated nutrition needs; not met  Monitor:  PO intake, weight trends, labs, I/O's  80 y.o. male  Admitting Dx: Left pontine stroke  ASSESSMENT: Pt with history of HTN, prostate cancer, recent PNA with malaise X 1 week who was admitted on 06/27/14 with right sided weakness with right gaze preference and difficulty talking. Patient was noted to be in A fib in setting of CAP. EKG evaluated by cardiology and felt to have multiple artifacts with 1 degree AVB and repeat EKG with NSR.   Meal completion has recently been 15-75%. Spoke with wife at pt bedside. She reports pt's appetite has been improving. He has been taking his Ensure pudding and Prostat. RD to increase prostat to help with protein needs. Pt was encouraged to eat his food at meals and to take his supplements.  Height: Ht Readings from Last 1 Encounters:  07/03/14 5' 9"  (1.753 m)    Weight: Wt Readings from Last 1 Encounters:  07/13/14 136 lb 3.9 oz (61.8 kg)    BMI:  Body mass index is 20.11 kg/(m^2).  Re-Estimated Nutritional Needs: Kcal: 1750-1950 Protein: 80-90 grams Fluid: 1.75- 1.95 L/day  Skin: wound on head, non-pitting RLE edema  Diet Order: DIET DYS 2 with nectar thick liquids   Intake/Output Summary (Last 24 hours) at 07/13/14 1433 Last data filed at 07/13/14 1005  Gross per 24 hour  Intake     60 ml  Output    250 ml  Net   -190 ml    Last BM: 12/16  Labs:   Recent Labs Lab  07/13/14 0600  CREATININE 0.66    CBG (last 3)  No results for input(s): GLUCAP in the last 72 hours.  Scheduled Meds: . antiseptic oral rinse  7 mL Mouth Rinse QID  . aspirin  325 mg Oral Daily  . atorvastatin  20 mg Oral q1800  . benzonatate  100 mg Oral TID  . enoxaparin (LOVENOX) injection  40 mg Subcutaneous Q24H  . feeding supplement (ENSURE)  1 Container Oral TID WC  . feeding supplement (PRO-STAT SUGAR FREE 64)  30 mL Oral Q1500  . magic mouthwash  10 mL Oral TID  . polycarbophil  625 mg Oral BID  . saccharomyces boulardii  250 mg Oral BID  . white petrolatum   Topical BID    Continuous Infusions: . sodium chloride 75 mL/hr at 07/12/14 2123    Past Medical History  Diagnosis Date  . Hypertension   . Cancer     Past Surgical History  Procedure Laterality Date  . Appendectomy    . Kidney surgery      Kallie Locks, MS, RD, LDN Pager # [phone] After hours/ weekend pager # [phone]

## 2014-07-13 NOTE — Progress Notes (Signed)
Physical Therapy Session Note  Patient Details  Name: [name] MRN: [id number] Date of Birth: [birth year]  Today's Date: 07/13/2014 PT Co-Treatment Time: 1400 (whole session 1400-1500 w/ OT)-1430 PT Co-Treatment Time Calculation (min): 30 min  Short Term Goals: Week 2:  PT Short Term Goal 1 (Week 2): Pt to perform bed mobility with mod Ax1person PT Short Term Goal 2 (Week 2): Pt to perform bed<>w/c transfer with mod Ax1person PT Short Term Goal 3 (Week 2): Pt to maintain dynamic sitting balance with min Ax1person PT Short Term Goal 4 (Week 2): Pt to propel w/c 68' with mod Ax1person PT Short Term Goal 5 (Week 2): Pt to ambulate 25' with LRAD and Ax2persons  Skilled Therapeutic Interventions/Progress Updates:   Pt received lying in bed, agreeable to therapy session.  [place] daughter and wife present during session to observe.  Performed bed mobility with max A and max A cues for recalling log rolling and SL>sit technique.  Assisted to/from therapy gym at total A level.  Skilled co-tx with OT in order to address stretching to cervical, thoracic, and lumbar spine, pectorals, RUE PROM w/ OT, B hamstring stretch and knee to chest glute stretch BLE prior to any mobility.  Also addressed upright posture, trunk control, and NMR for RUE/LE through tall kneeling task while reaching R and L for clothes pins. Pt doing much better activating hip extension in tall kneeling, and did very well shifting over to LLE during reaching tasks.  Ended session with gait training for NMR and upright posture via "three muskateer style" with OT providing assist for upright posture and increased weight shift to LLE during R swing phase and PT assisting with anterior pelvic tilt, stabilizing RLE (with R AFO and shoe cover for better clearance) and advancing RLE.  Note he is able to initially clear RLE with adequate weight shift, however with fatigue was only able to provide trace hip flex.  Performed 20' x 1 and another 30'  x 1.  Assisted back to room and back to bed.  See OT note for further details.    Therapy Documentation Precautions:  Precautions Precautions: Fall Precaution Comments: R hemiplegia, R gaze preference Restrictions Weight Bearing Restrictions: No   Vital Signs: Therapy Vitals Temp: 97.5 F (36.4 C) Temp Source: Axillary Pulse Rate: 65 Resp: 18 BP: (!) 135/59 mmHg Patient Position (if appropriate): Lying Oxygen Therapy SpO2: 96 % O2 Device: Not Delivered Pain: Pain Assessment Pain Assessment: No/denies pain Pain Score: 0-No pain   Locomotion : Ambulation Ambulation/Gait Assistance: 1: +2 Total assist   See FIM for current functional status  Therapy/Group: Co-Treatment  Cherrill Scrima, Betha Loa 07/13/2014, 4:50 PM

## 2014-07-13 NOTE — Progress Notes (Signed)
Occupational Therapy Session Note  Patient Details  Name: [name] MRN: [id number] Date of Birth: [birth year]  Today's Date: 07/13/2014 OT Individual Time: 0900-1000 OT Individual Time Calculation (min): 60 min    Short Term Goals: Week 2:  OT Short Term Goal 1 (Week 2): Pt will peform UB dressing with Mod A in order to increase I in self care. OT Short Term Goal 2 (Week 2): Pt will perform shower transfer with Max A of 1 in order to increase i in self care. OT Short Term Goal 3 (Week 2): Pt will perform grooming with min A in order to increase I in self care. OT Short Term Goal 4 (Week 2): Pt will perform LB dressing with Max A of 1 in order to increase i in self care.   Skilled Therapeutic Interventions/Progress Updates:  Upon entering the room, pt supine in bed with wife present. Pt required Max A for supine to sit. Pt requesting to eat breakfast vs shower this morning. Pt seated unsupported at times with close supervision and max verbal cues for weight shifting to the R hip and UE for upright posture. Pt keeping head up and in neutral position while eating. Pt requiring verbal cues for safety with eating to sweep tongue for pocketing, use finger to check for pocketing, take smaller bites, and drink to help clear food from mouth. Pt transferred squat pivot to wheelchair with Mod A for dressing tasks seated in wheelchair. Small foot stool utilized in order to bring feet closer to body for greater success in threading pants onto feet. Pt encouraged to perform and given max verbal cues for anterior weight shifting with dressing and transfer tasks. Pt able to perform hemiplegic UB dressing with Min A this session. After pulling shirt over head his scab became dislodged from previous surgery. RN notified and attended to this area during session secondary to slight bleeding. Pt seated in wheelchair with QRB and arm tray donned. Wife present in room upon exiting with call bell within reach.    Therapy Documentation Precautions:  Precautions Precautions: Fall Precaution Comments: R hemiplegia, R gaze preference Restrictions Weight Bearing Restrictions: No Pain: Pain Assessment Pain Assessment: No/denies pain  See FIM for current functional status  Therapy/Group: Individual Therapy  [name] 07/13/2014, 5:09 PM

## 2014-07-13 NOTE — Progress Notes (Deleted)
Occupational Therapy Session Note  Patient Details  Name: [name] MRN: [id number] Date of Birth: 1926-02-07  Today's Date: 07/13/2014 OT Individual Time: 0900-1000 OT Individual Time Calculation (min): 60 min    Short Term Goals: Week 1:  OT Short Term Goal 1 (Week 1): Pt will complete toilet transfer with max assist +1 OT Short Term Goal 1 - Progress (Week 1): Progressing toward goal OT Short Term Goal 2 (Week 1): Pt will complete LB dressing with max assist +1 OT Short Term Goal 2 - Progress (Week 1): Progressing toward goal OT Short Term Goal 3 (Week 1): Pt will complete UB dressing with max assist  OT Short Term Goal 3 - Progress (Week 1): Met OT Short Term Goal 4 (Week 1): Pt will sit unsupported during functional task for 2 min with min assist  OT Short Term Goal 4 - Progress (Week 1): Progressing toward goal Week 2:  OT Short Term Goal 1 (Week 2): Pt will peform UB dressing with Mod A in order to increase I in self care. OT Short Term Goal 2 (Week 2): Pt will perform shower transfer with Max A of 1 in order to increase i in self care. OT Short Term Goal 3 (Week 2): Pt will perform grooming with min A in order to increase I in self care. OT Short Term Goal 4 (Week 2): Pt will perform LB dressing with Max A of 1 in order to increase i in self care.  Week 3:     Skilled Therapeutic Interventions/Progress Updates:      Therapy Documentation Precautions:  Precautions Precautions: Fall Precaution Comments: R hemiplegia, R gaze preference Restrictions Weight Bearing Restrictions: No General:   Vital Signs: Therapy Vitals Temp: 97.5 F (36.4 C) Temp Source: Axillary Pulse Rate: 65 Resp: 18 BP: (!) 135/59 mmHg Patient Position (if appropriate): Lying Oxygen Therapy SpO2: 96 % O2 Device: Not Delivered Pain: Pain Assessment Pain Assessment: No/denies pain ADL:   Exercises:   Other Treatments:    See FIM for current functional status  Therapy/Group:  Individual Therapy  [name] 07/13/2014, 5:09 PM

## 2014-07-13 NOTE — Progress Notes (Signed)
Occupational Therapy Session Note  Patient Details  Name: [name] MRN: [id number] Date of Birth: [birth year]  Today's Date: 07/13/2014 OT Co-Treatment Time:1430  -  [id number] ( Co treatment with PT from 1400-1505)  (total co treatment time of 65 minutes)   Short Term Goals: Week 2:  OT Short Term Goal 1 (Week 2): Pt will peform UB dressing with Mod A in order to increase I in self care. OT Short Term Goal 2 (Week 2): Pt will perform shower transfer with Max A of 1 in order to increase i in self care. OT Short Term Goal 3 (Week 2): Pt will perform grooming with min A in order to increase I in self care. OT Short Term Goal 4 (Week 2): Pt will perform LB dressing with Max A of 1 in order to increase i in self care.   Skilled Therapeutic Interventions/Progress Updates:  Upon entering room pt supine in bed with wife and granddaughter present throughout session. Pt with no c/o pain. Skilled co treatment with PT with continued focus on stretching of cervical,thoracis, lumbar spine, and pectorals. R UE PROM x 5 reps in all planes while PT performing B LE stretches. Pt assisted into tall kneeling position via "three muskateer style" for upright posture, trunk control, and weight bearing through R UE and R LE. Pt able to initiate weight shifting while reaching upwards to the L and R for clothes pins. Pt engaged in 2 bouts of ambulation of 20' and 30 feet respectively via "three muskateer style". Assistance for posture and weight shifting to the L while PT assisted with advancement and stabilizing of R LE. Pt with increased fatigue from bouts of ambulation and assisted back to room via wheelchair. Once in room pt requesting to return to bed to rest. Pt requiring max verbal cues for set up for transfer. Pt able to shift L hip forward independently and requires A for scooting forward on R hip. Pt unable to verbalize steps for set up for transfer. Mod A Bobath technique for returning to bed with pt holding R UE  in lap for safety. Pt required Max A sit >supine via log roll technique to return to bed. OT positioned pillows in order to protect hemiplegic side and only 2 pillows under neck in order to promote more neutral neck position. Bed alarm on and call bell within reach upon exiting the room.       Therapy Documentation Precautions:  Precautions Precautions: Fall Precaution Comments: R hemiplegia, R gaze preference Restrictions Weight Bearing Restrictions: No Pain: Pain Assessment Pain Assessment: No/denies pain Pain Score: 0-No pain  See FIM for current functional status  Therapy/Group: Co-Treatment  [place], Velvia Mehrer L 07/13/2014, 4:34 PM

## 2014-07-13 NOTE — Progress Notes (Signed)
Speech Language Pathology Daily Session Note  Patient Details  Name: [name] MRN: [id number] Date of Birth: 15-Jan-1926  Today's Date: 07/13/2014 SLP Individual Time: 1030-1130 SLP Individual Time Calculation (min): 60 min  Short Term Goals: Week 2: SLP Short Term Goal 1 (Week 2): Patient will consume Dys.2 textures and nectar-thick liquids via cup with Supervision verbal cues to utilize safe swallow strategies to minimize overt s/s of aspiration. SLP Short Term Goal 2 (Week 2): Patient will demonstrate effective mastication of Dys. 3 textures across 2 consecutive sessions with Min verbal cues for portion control and use of lingual sweeps to minimize overt s/s of aspiration. SLP Short Term Goal 3 (Week 2): Patient will demonstrate readiness for repeat objective swallow assessment with improved managment of oral secreations with no more than 5 cues needed to manage secreations in a 30 minute session. SLP Short Term Goal 4 (Week 2): Patient will produce intelligible speech at the phrase level with Min multimodal cues to utilize compensatory strategies. SLP Short Term Goal 5 (Week 2): Patient will initiate basic self-care tasks with no more than 2 verbal cues.    Skilled Therapeutic Interventions: Skilled treatment session focused on addressing dysphagia and dysarthria goals.  SLP facilitated session with set-up ice chip and water via cup trials.  Patient consumed ice chips with cough x1 due to portion size, which he was able to identify and correct after first occurrence.  Trials were then advanced to small controlled sips of water via cup at a slow pace with was effective at preventing overt s/s of aspiration with the exception of once when patient reported being over confident and taking too big a sip.  Following the one occurrence he was able to self-monitor and correct with all other trials.  Recommend to continue trials of ice chips and water with SLP only at this time.  Patient was able to  return demonstrate of hard effortful pharyngeal strengthening exercises with Min verbal cues for encouragement.  SLP also provided Mod verbal cues to recall and utilize speech intelligibility strategies at the sentence level of verbal expression throughout session.   Upright head posture and over articulation greatly impact overall intelligibility.     FIM:  Comprehension Comprehension Mode: Auditory Comprehension: 5-Follows basic conversation/direction: With extra time/assistive device Expression Expression Mode: Verbal Expression: 3-Expresses basic 50 - 74% of the time/requires cueing 25 - 50% of the time. Needs to repeat parts of sentences. Social Interaction Social Interaction: 5-Interacts appropriately 90% of the time - Needs monitoring or encouragement for participation or interaction. Problem Solving Problem Solving: 4-Solves basic 75 - 89% of the time/requires cueing 10 - 24% of the time Memory Memory: 4-Recognizes or recalls 75 - 89% of the time/requires cueing 10 - 24% of the time FIM - Eating Eating Activity: 5: Needs verbal cues/supervision  Pain Pain Assessment Pain Assessment: No/denies pain  Therapy/Group: Individual Therapy  Carmelia Roller., [place] [phone]  [place] 07/13/2014, 1:31 PM

## 2014-07-13 NOTE — Progress Notes (Signed)
Subjective/Complaints: 80 y.o. left handed male with history of HTN, prostate cancer, recent PNA with malaise X 1 week who was admitted on 06/27/14 with right sided weakness with right gaze preference and difficulty talking. Patient was noted to be in A fib in setting of CAP and CT head without acute changes. EKG evaluated by cardiology and felt to have multiple artifacts with 1 degree AVB and repeat EKG with NSR. NIHSS- 14. 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   Doing ok, slept well, no pain c/os  Review of Systems - Negative except weakness on right side Objective: Vital Signs: Blood pressure 132/56, pulse 59, temperature 97.7 F (36.5 C), temperature source Oral, resp. rate 18, height 5' 9"  (1.753 m), weight 61.8 kg (136 lb 3.9 oz), SpO2 95 %. No results found. Results for orders placed or performed during the hospital encounter of 07/02/14 (from the past 72 hour(s))  Creatinine, serum     Status: Abnormal   Collection Time: 07/13/14  6:00 AM  Result Value Ref Range   Creatinine, Ser 0.66 0.50 - 1.35 mg/dL   GFR calc non Af Amer 85 (L) >90 mL/min   GFR calc Af Amer >90 >90 mL/min    Comment: (NOTE) The eGFR has been calculated using the CKD EPI equation. This calculation has not been validated in all clinical situations. eGFR's persistently <90 mL/min signify possible Chronic Kidney Disease.      HEENT: normal, no evidence of thrush Cardio: RRR Resp: CTA B/L GI: BS positive and NT,ND Extremity:  Edema Right hand and Right foot Skin:   Wound vertex of scalp, eschar Neuro: Lethargic, Cranial Nerve Abnormalities Right central 7, Abnormal Sensory reduced on left side to  LT, Abnormal Motor 0/5 on right side except trace Right foot plantar flexion and Tone:  Hypotonia Musc/Skel:  Other no pain with Right shoulder ROM Gen NAD   Assessment/Plan: 1. Functional deficits secondary to left paramedian pontine infarct which require 3+ hours per day of interdisciplinary therapy in a comprehensive inpatient rehab setting. Physiatrist is providing close team supervision and 24 hour management of active medical problems listed below. Physiatrist and rehab team continue to assess barriers to discharge/monitor patient progress toward functional and medical goals. Marland KitchenFIM: FIM - Bathing Bathing Steps Patient Completed: Chest, Right Arm, Abdomen, Right upper leg, Left upper leg Bathing: 3: Mod-Patient completes 5-7 57f10 parts or 50-74%  FIM - Upper Body Dressing/Undressing Upper body dressing/undressing steps patient completed: Thread/unthread right sleeve of pullover shirt/dresss, Put head through opening of pull over shirt/dress Upper body dressing/undressing: 3: Mod-Patient completed 50-74% of tasks FIM - Lower Body Dressing/Undressing Lower body dressing/undressing steps patient completed: Thread/unthread left pants leg Lower body dressing/undressing: 1: Two helpers  FIM - Toileting Toileting: 1: Total-Patient completed zero steps, helper did all 3  FIM - TRadio producerDevices: Bedside commode Toilet Transfers: 1-From toilet/BSC: Total A (helper does all/Pt. < 25%)  FIM - Bed/Chair Transfer Bed/Chair Transfer Assistive Devices: Arm rests Bed/Chair Transfer: 1: Two helpers (for nursing training)  FIM - Locomotion: Wheelchair Distance: 125' Locomotion: Wheelchair: 0: Activity did not occur FIM - Locomotion: Ambulation Locomotion: Ambulation Assistive Devices: Other (comment) (L handrail in hallway) Ambulation/Gait Assistance: 1: +2 Total assist Locomotion: Ambulation:  0: Activity did not occur  Comprehension Comprehension Mode:  Auditory (Simultaneous filing. User may not have seen previous data.) Comprehension: 5-Understands basic 90% of the time/requires cueing < 10% of the time (Simultaneous filing. User may not have seen previous data.)  Expression Expression Mode: Verbal (Simultaneous filing. User may not have seen previous data.) Expression: 3-Expresses basic 50 - 74% of the time/requires cueing 25 - 50% of the time. Needs to repeat parts of sentences. (Simultaneous filing. User may not have seen previous data.)  Social Interaction Social Interaction: 4-Interacts appropriately 75 - 89% of the time - Needs redirection for appropriate language or to initiate interaction. (Simultaneous filing. User may not have seen previous data.)  Problem Solving Problem Solving: 3-Solves basic 50 - 74% of the time/requires cueing 25 - 49% of the time  Memory Memory: 3-Recognizes or recalls 50 - 74% of the time/requires cueing 25 - 49% of the time   Medical Problem List and Plan: 1. Functional deficits secondary to left paramedian pontine infarct 2. DVT Prophylaxis/Anticoagulation: Pharmaceutical: Lovenox 3. Pain Management: N/A 4. Mood: LCSW to follow for evaluation and support.  5. Neuropsych: This patient is not capable of making decisions on his own behalf. 6. Skin/Wound Care:hx melanoma removal scalp, resume vaseline, family to bring in name of cream from dermatologist Routine pressure relief measures. Turn patient every 2 hours when in bed.  7. Fluids/Electrolytes/Nutrition: Monitor I/O. Check daily weights to monitor for signs of overload.  8. CAP: followup CXR improved , WBCs normalizing as well , D/C abx                                                                           9. Acute renal insufficiency: Improved. Off HCTZ at this time. 10. HTN: Will monitor every 8 hours and allow permissive HTN for adequate perfusion. 11.  leucocytosis: secondary to PNA . Follow temp curve for now.  12. Thrush:  resolved 13.  Poor appetite, not nausea realated, pt reports eating food brought in by wife LOS (Days) Lillie E 07/13/2014, 7:54 AM

## 2014-07-14 ENCOUNTER — Inpatient Hospital Stay (HOSPITAL_COMMUNITY): Payer: Medicare Other | Admitting: *Deleted

## 2014-07-14 ENCOUNTER — Inpatient Hospital Stay (HOSPITAL_COMMUNITY): Payer: Medicare Other | Admitting: Speech Pathology

## 2014-07-14 NOTE — Progress Notes (Signed)
Occupational Therapy Session Note  Patient Details  Name: [name] MRN: [id number] Date of Birth: 1926-07-23  Today's Date: 07/14/2014 OT Individual Time:  -   1545-1620  (35 min)      Short Term Goals: Week 1:  OT Short Term Goal 1 (Week 1): Pt will complete toilet transfer with max assist +1 OT Short Term Goal 1 - Progress (Week 1): Progressing toward goal OT Short Term Goal 2 (Week 1): Pt will complete LB dressing with max assist +1 OT Short Term Goal 2 - Progress (Week 1): Progressing toward goal OT Short Term Goal 3 (Week 1): Pt will complete UB dressing with max assist  OT Short Term Goal 3 - Progress (Week 1): Met OT Short Term Goal 4 (Week 1): Pt will sit unsupported during functional task for 2 min with min assist  OT Short Term Goal 4 - Progress (Week 1): Progressing toward goal Week 2:  OT Short Term Goal 1 (Week 2): Pt will peform UB dressing with Mod A in order to increase I in self care. OT Short Term Goal 2 (Week 2): Pt will perform shower transfer with Max A of 1 in order to increase i in self care. OT Short Term Goal 3 (Week 2): Pt will perform grooming with min A in order to increase I in self care. OT Short Term Goal 4 (Week 2): Pt will perform LB dressing with Max A of 1 in order to increase i in self care.   Skilled Therapeutic Interventions/Progress Updates:    Addressed toilet transfers, bed mobility.  Wife,Agnes, present during session.  Pt. Transferred from EOB to [facility] with total assist +2, (pt = 30 % ) with strong pusher posterior.  Pt. Was total assist with toileting.  Transferred back to bed with same level of assist.  Encouraged pt to sit EOB but he did not feel well so returned to supine.  Rolled to right and left x4 for positioning with total assist. Positioned  RUE on pillows for comfort and symmetrical alignment.   Pt left in bed with all needs in reach.  Wife in room    .     Therapy Documentation Precautions:  Precautions Precautions:  Fall Precaution Comments: R hemiplegia, R gaze preference Restrictions Weight Bearing Restrictions: No     Pain:none              See FIM for current functional status  Therapy/Group: Individual Therapy  Lisa Roca 07/14/2014, 4:20 PM

## 2014-07-14 NOTE — Progress Notes (Addendum)
[name] is a 80 y.o. male 04-03-1926 [id number]  Subjective: No new complaints. No new problems. Slept well. Feeling OK.  Objective: Vital signs in last 24 hours: Temp:  [97.5 F (36.4 C)-98.1 F (36.7 C)] 98.1 F (36.7 C) (12/19 0447) Pulse Rate:  [59-65] 59 (12/19 0447) Resp:  [18] 18 (12/19 0447) BP: (135-145)/(59) 145/59 mmHg (12/19 0447) SpO2:  [94 %-96 %] 94 % (12/19 0447) Weight change:  Last BM Date: 07/14/14  Intake/Output from previous day: 12/18 0701 - 12/19 0700 In: 540 [P.O.:540] Out: 600 [Urine:600] Last cbgs: CBG (last 3)  No results for input(s): GLUCAP in the last 72 hours.   Physical Exam General: No apparent distress   HEENT: not dry Lungs: Normal effort. Lungs clear to auscultation, no crackles or wheezes. Cardiovascular: Regular rate and rhythm, no edema Abdomen: S/NT/ND; BS(+) Musculoskeletal:  unchanged Neurological: No new neurological deficits, dysarthric Wounds: n/a Skin: clear  Aging changes Mental state: Alert, cooperative    Lab Results: BMET    Component Value Date/Time   NA 137 07/06/2014 0633   K 3.9 07/06/2014 0633   CL 103 07/06/2014 0633   CO2 22 07/06/2014 0633   GLUCOSE 91 07/06/2014 0633   BUN 20 07/06/2014 0633   CREATININE 0.66 07/13/2014 0600   CALCIUM 8.2* 07/06/2014 0633   GFRNONAA 85* 07/13/2014 0600   GFRAA >90 07/13/2014 0600   CBC    Component Value Date/Time   WBC 12.0* 07/09/2014 0849   RBC 4.34 07/09/2014 0849   HGB 12.4* 07/09/2014 0849   HCT 37.5* 07/09/2014 0849   PLT 392 07/09/2014 0849   MCV 86.4 07/09/2014 0849   MCH 28.6 07/09/2014 0849   MCHC 33.1 07/09/2014 0849   RDW 12.8 07/09/2014 0849   LYMPHSABS 1.4 07/09/2014 0849   MONOABS 0.9 07/09/2014 0849   EOSABS 0.1 07/09/2014 0849   BASOSABS 0.0 07/09/2014 0849    Studies/Results: No results found.  Medications: I have reviewed the patient's current medications.  Assessment/Plan:  1. Functional deficits secondary to left  paramedian pontine infarct 2. DVT Prophylaxis/Anticoagulation: Pharmaceutical: Lovenox 3. Pain Management: N/A 4. Mood: LCSW to follow for evaluation and support.  5. Neuropsych: This patient is not capable of making decisions on his own behalf. 6. Skin/Wound Care:hx melanoma removal scalp, resume vaseline, family to bring in name of cream from dermatologist Routine pressure relief measures. Turn patient every 2 hours when in bed.  7. Fluids/Electrolytes/Nutrition: Monitor I/O. Check daily weights to monitor for signs of overload.  8. CAP: followup CXR improved , WBCs normalizing as well , D/C abx  9. Acute renal insufficiency: Improved. Off HCTZ at this time. 10. HTN: Will monitor every 8 hours and allow permissive HTN for adequate perfusion. 11. leucocytosis: secondary to PNA . Follow temp curve for now.  12. Thrush: resolved 13. Poor appetite, not nausea realated, pt reports eating food brought in by wife   Length of stay, days: [place] , MD 07/14/2014, 1:41 PM

## 2014-07-15 ENCOUNTER — Inpatient Hospital Stay (HOSPITAL_COMMUNITY): Payer: Medicare Other | Admitting: *Deleted

## 2014-07-15 ENCOUNTER — Inpatient Hospital Stay (HOSPITAL_COMMUNITY): Payer: Medicare Other | Admitting: Physical Therapy

## 2014-07-15 ENCOUNTER — Inpatient Hospital Stay (HOSPITAL_COMMUNITY): Payer: Medicare Other

## 2014-07-15 MED ORDER — AMOXICILLIN-POT CLAVULANATE 400-57 MG/5ML PO SUSR
875.0000 mg | Freq: Two times a day (BID) | ORAL | Status: DC
  Administered 2014-07-15 – 2014-07-18 (×5): 875 mg via ORAL
  Filled 2014-07-15 (×10): qty 10.9

## 2014-07-15 NOTE — Progress Notes (Signed)
Physical Therapy Session Note  Patient Details  Name: [name] MRN: [id number] Date of Birth: 02/19/1926  Today's Date: 07/15/2014 PT Individual Time: 0800-0845 PT Individual Time Calculation (min): 45 min   Short Term Goals: Week 1:  PT Short Term Goal 1 (Week 1): Pt to perform bed mobility with mod Ax1person PT Short Term Goal 1 - Progress (Week 1): Progressing toward goal PT Short Term Goal 2 (Week 1): Pt to perform bed<>w/c transfer with max Ax1person PT Short Term Goal 2 - Progress (Week 1): Met PT Short Term Goal 3 (Week 1): Pt to maintain dynamic sitting balance with mod Ax1person PT Short Term Goal 3 - Progress (Week 1): Met PT Short Term Goal 4 (Week 1): Pt to propel w/c 75' with mod Ax1person PT Short Term Goal 4 - Progress (Week 1): Progressing toward goal PT Short Term Goal 5 (Week 1): Pt to ambulate 10' with LRAD and Ax2persons PT Short Term Goal 5 - Progress (Week 1): Met Week 2:  PT Short Term Goal 1 (Week 2): Pt to perform bed mobility with mod Ax1person PT Short Term Goal 2 (Week 2): Pt to perform bed<>w/c transfer with mod Ax1person PT Short Term Goal 3 (Week 2): Pt to maintain dynamic sitting balance with min Ax1person PT Short Term Goal 4 (Week 2): Pt to propel w/c 75' with mod Ax1person PT Short Term Goal 5 (Week 2): Pt to ambulate 25' with LRAD and Ax2persons  Skilled Therapeutic Interventions/Progress Updates:    Pt with new orders for chest PT, initiated in today's session. Unclear if secretion expectoration vs mobilization issues at this time. Weak cough, noted with pt difficulty performing cough assist techniques secondary to poor sequencing. Gastroc, L/S, and glute local mobility restrictions addressed through soft tissue stretches. Pt would continue to benefit from skilled PT services to increase functional mobility.    Therapy Documentation Precautions:  Precautions Precautions: Fall Precaution Comments: R hemiplegia, R gaze  preference Restrictions Weight Bearing Restrictions: No Pain: Pain Assessment Pain Assessment: No/denies pain Pain Score: Asleep PAINAD (Pain Assessment in Advanced Dementia) Breathing: normal Mobility:  Pt is Mod A for transfers with cues for weight shift Pt is Max A for bed mobility with cues for weight shift and technique Other Treatments:  Pt performs sitting and dynamic sitting and standing balance activities with dual motor tasks including weight shifting, reaching, grasping, and LE manipulation within functional task. Pt performs static sitting balance 2x3' with Mod A when fatigued. L/S ext soft tissue stretch, anterior weight shifts with hold 30"x3. Transfers x4 in session. Percussion performed in all lobes. Cough assist weight shifts performed x10.  See FIM for current functional status  Therapy/Group: Individual Therapy  ,  G 07/15/2014, 12:20 PM  

## 2014-07-15 NOTE — Progress Notes (Signed)
Chest X-ray ordered this AM.  Dr. Leanne Chang notified w/ results and made aware.

## 2014-07-15 NOTE — Progress Notes (Signed)
Increased confusion tonight. ? Recheck UA. Incontinent of urine. Threw up X 1 during night, light tan emesis. Large incontinent stool. Did not call for assistance. Congested, productive cough. Patrici Ranks A

## 2014-07-15 NOTE — Progress Notes (Signed)
Called by nursing staff Reviewed cxr concerning for possible aspiration pneumonia in patient at high risk Reviewed CXR and compared to cxr of 12/14  Start augmentin

## 2014-07-15 NOTE — Progress Notes (Addendum)
[name] is a 80 y.o. male 16-Aug-1925 [id number]  Subjective:  Occ vomiting per RN - ?aspirating  Objective: Vital signs in last 24 hours: Temp:  [97.7 F (36.5 C)-98.5 F (36.9 C)] 98.5 F (36.9 C) (12/20 0611) Pulse Rate:  [67-85] 85 (12/20 0611) Resp:  [16] 16 (12/20 0611) BP: (141-157)/(58-79) 141/79 mmHg (12/20 0611) SpO2:  [93 %-96 %] 93 % (12/20 0611) Weight change:  Last BM Date: 07/14/14  Intake/Output from previous day: 12/19 0701 - 12/20 0700 In: 300 [P.O.:300] Out: -  Last cbgs: CBG (last 3)  No results for input(s): GLUCAP in the last 72 hours.   Physical Exam General: No apparent distress   HEENT: not dry Lungs: Normal effort. Lungs w/decr BS at bases Cardiovascular: Regular rate and rhythm, no edema Abdomen: S/NT/ND; BS(+) Musculoskeletal:  unchanged Neurological: No new neurological deficits, dysarthric Wounds: n/a Skin: clear  Aging changes Mental state: Alert, cooperative    Lab Results: BMET    Component Value Date/Time   NA 137 07/06/2014 0633   K 3.9 07/06/2014 0633   CL 103 07/06/2014 0633   CO2 22 07/06/2014 0633   GLUCOSE 91 07/06/2014 0633   BUN 20 07/06/2014 0633   CREATININE 0.66 07/13/2014 0600   CALCIUM 8.2* 07/06/2014 0633   GFRNONAA 85* 07/13/2014 0600   GFRAA >90 07/13/2014 0600   CBC    Component Value Date/Time   WBC 12.0* 07/09/2014 0849   RBC 4.34 07/09/2014 0849   HGB 12.4* 07/09/2014 0849   HCT 37.5* 07/09/2014 0849   PLT 392 07/09/2014 0849   MCV 86.4 07/09/2014 0849   MCH 28.6 07/09/2014 0849   MCHC 33.1 07/09/2014 0849   RDW 12.8 07/09/2014 0849   LYMPHSABS 1.4 07/09/2014 0849   MONOABS 0.9 07/09/2014 0849   EOSABS 0.1 07/09/2014 0849   BASOSABS 0.0 07/09/2014 0849    Studies/Results: No results found.  Medications: I have reviewed the patient's current medications.  Assessment/Plan:  1. Functional deficits secondary to left paramedian pontine infarct 2. DVT Prophylaxis/Anticoagulation:  Pharmaceutical: Lovenox 3. Pain Management: N/A 4. Mood: LCSW to follow for evaluation and support.  5. Neuropsych: This patient is not capable of making decisions on his own behalf. 6. Skin/Wound Care:hx melanoma removal scalp, resume vaseline, family to bring in name of cream from dermatologist Routine pressure relief measures. Turn patient every 2 hours when in bed.  7. Fluids/Electrolytes/Nutrition: Monitor I/O. Check daily weights to monitor for signs of overload.  8. CAP: followup CXR improved , WBCs normalizing as well , D/C abx  9. Acute renal insufficiency: Improved. Off HCTZ at this time. 10. HTN: Will monitor every 8 hours and allow permissive HTN for adequate perfusion. 11. leucocytosis: secondary to PNA . Follow temp curve for now.  12. Thrush: resolved 13. Poor appetite, not nausea realated, pt reports eating food brought in by wife. Repeat CXR   Length of stay, days: [place] , MD 07/15/2014, 8:33 AM

## 2014-07-16 ENCOUNTER — Inpatient Hospital Stay (HOSPITAL_COMMUNITY): Payer: Medicare Other | Admitting: *Deleted

## 2014-07-16 ENCOUNTER — Inpatient Hospital Stay (HOSPITAL_COMMUNITY): Payer: Medicare Other | Admitting: Rehabilitation

## 2014-07-16 ENCOUNTER — Inpatient Hospital Stay (HOSPITAL_COMMUNITY): Payer: Medicare Other | Admitting: Speech Pathology

## 2014-07-16 LAB — CBC WITH DIFFERENTIAL/PLATELET
Basophils Absolute: 0.1 10*3/uL (ref 0.0–0.1)
Basophils Relative: 0 % (ref 0–1)
Eosinophils Absolute: 0.1 10*3/uL (ref 0.0–0.7)
Eosinophils Relative: 1 % (ref 0–5)
HEMATOCRIT: 35.5 % — AB (ref 39.0–52.0)
Hemoglobin: 11.4 g/dL — ABNORMAL LOW (ref 13.0–17.0)
LYMPHS PCT: 10 % — AB (ref 12–46)
Lymphs Abs: 1.2 10*3/uL (ref 0.7–4.0)
MCH: 28 pg (ref 26.0–34.0)
MCHC: 32.1 g/dL (ref 30.0–36.0)
MCV: 87.2 fL (ref 78.0–100.0)
MONO ABS: 1.1 10*3/uL — AB (ref 0.1–1.0)
Monocytes Relative: 9 % (ref 3–12)
Neutro Abs: 9.7 10*3/uL — ABNORMAL HIGH (ref 1.7–7.7)
Neutrophils Relative %: 80 % — ABNORMAL HIGH (ref 43–77)
Platelets: 387 10*3/uL (ref 150–400)
RBC: 4.07 MIL/uL — ABNORMAL LOW (ref 4.22–5.81)
RDW: 13.2 % (ref 11.5–15.5)
WBC: 12.2 10*3/uL — AB (ref 4.0–10.5)

## 2014-07-16 NOTE — Progress Notes (Addendum)
Subjective/Complaints: 80 y.o. left handed male with history of HTN, prostate cancer, recent PNA with malaise X 1 week who was admitted on 06/27/14 with right sided weakness with right gaze preference and difficulty talking. Patient was noted to be in A fib in setting of CAP and CT head without acute changes. EKG evaluated by cardiology and felt to have multiple artifacts with 1 degree AVB and repeat EKG with NSR. NIHSS- 14. 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   CXR rechecked yesterday for increased cough, read as poss bibasilar PNA but looks nearly identical to 12/14 CXR which was clearing of PNA compared to previous Pt main c/o was actually nausea Afeb Review of Systems - Negative except weakness on right side Objective: Vital Signs: Blood pressure 155/58, pulse 64, temperature 98.4 F (36.9 C), temperature source Oral, resp. rate 16, height 5\' 9"  (1.753 m), weight 63.1 kg (139 lb 1.8 oz), SpO2 96 %. Dg Chest 2 View  07/15/2014   CLINICAL DATA:  Persistent cough for 2 weeks, no chest complaints at this time. No shortness of breath.  EXAM: CHEST  2 VIEW  COMPARISON:  07/09/2014, 09/20/2009  FINDINGS: Bibasilar mild interstitial thickening which may reflect bibasilar pneumonia including aspiration pneumonia. There is no pleural effusion or pneumothorax. The heart and mediastinal contours are unremarkable.  There is mild thoracic spine spondylosis.  IMPRESSION: Bibasilar mild interstitial thickening which may reflect bibasilar pneumonia versus aspiration pneumonia.   Electronically Signed   By: Kathreen Devoid   On: 07/15/2014 09:49   No results found for this or any previous visit (from  the past 72 hour(s)).   HEENT: normal, no evidence of thrush Cardio: RRR Resp: CTA B/L, no resp distress GI: BS positive and NT,ND Extremity:  Edema Right hand and Right foot Skin:   Wound vertex of scalp, eschar Neuro: Lethargic, Cranial Nerve Abnormalities Right central 7, Abnormal Sensory reduced on left side to LT, Abnormal Motor 0/5 on right side except trace Right foot plantar flexion and Tone:  Hypotonia Musc/Skel:  Other no pain with Right shoulder ROM Gen NAD   Assessment/Plan: 1. Functional deficits secondary to left paramedian pontine infarct which require 3+ hours per day of interdisciplinary therapy in a comprehensive inpatient rehab setting. Physiatrist is providing close team supervision and 24 hour management of active medical problems listed below. Physiatrist and rehab team continue to assess barriers to discharge/monitor patient progress toward functional and medical goals. Marland KitchenFIM: FIM - Bathing Bathing Steps Patient Completed: Chest, Right Arm, Abdomen, Right upper leg, Left upper leg Bathing: 3: Mod-Patient completes 5-7 63f 10 parts or 50-74%  FIM - Upper Body Dressing/Undressing Upper body dressing/undressing steps patient completed: Thread/unthread right sleeve of pullover shirt/dresss, Thread/unthread left sleeve of pullover shirt/dress, Put head through opening of pull over shirt/dress Upper body dressing/undressing: 4: Min-Patient completed 75 plus % of tasks FIM - Lower Body Dressing/Undressing Lower body dressing/undressing steps patient completed: Thread/unthread left pants leg Lower body dressing/undressing: 1: Two helpers  FIM - Toileting Toileting: 1: Total-Patient completed zero steps, helper did all 3  FIM - Radio producer Devices: Bedside commode Toilet Transfers: 1-From toilet/BSC: Total A (helper does all/Pt. < 25%)  FIM - Bed/Chair Transfer Bed/Chair Transfer Assistive Devices: Arm  rests Bed/Chair Transfer: 3:  Chair or W/C > Bed: Mod A (lift or lower assist), 3: Bed > Chair or W/C: Mod A (lift or lower assist), 2: Supine > Sit: Max A (lifting assist/Pt. 25-49%)  FIM - Locomotion: Wheelchair Distance: 125' Locomotion: Wheelchair: 0: Activity did not occur FIM - Locomotion: Ambulation Locomotion: Ambulation Assistive Devices:  (three muskateer style) Ambulation/Gait Assistance: 1: +2 Total assist Locomotion: Ambulation: 0: Activity did not occur  Comprehension Comprehension Mode: Visual Comprehension: 5-Set-up assist with hearing device  Expression Expression Mode: Verbal Expression: 5-Expresses basic needs/ideas: With no assist  Social Interaction Social Interaction: 4-Interacts appropriately 75 - 89% of the time - Needs redirection for appropriate language or to initiate interaction.  Problem Solving Problem Solving: 5-Solves basic problems: With no assist  Memory Memory: 3-Recognizes or recalls 50 - 74% of the time/requires cueing 25 - 49% of the time   Medical Problem List and Plan: 1. Functional deficits secondary to left paramedian pontine infarct 2. DVT Prophylaxis/Anticoagulation: Pharmaceutical: Lovenox 3. Pain Management: N/A 4. Mood: LCSW to follow for evaluation and support.  5. Neuropsych: This patient is not capable of making decisions on his own behalf. 6. Skin/Wound Care:hx melanoma removal scalp, resume vaseline, family to bring in name of cream from dermatologist Routine pressure relief measures. Turn patient every 2 hours when in bed.  7. Fluids/Electrolytes/Nutrition: Monitor I/O. Check daily weights to monitor for signs of overload.  8. CAP: Recurrent was off abx for several days with normalization of WBCs, recheck CBC                                                                      9. Acute renal insufficiency: Improved. Off HCTZ at this time. 10. HTN: Will monitor every 8 hours and allow permissive HTN for adequate perfusion. 11.   Leucocytosis:improved on last CBC will recheck , if afeb and WBC ok would d/c abx 12. Thrush: resolved  LOS (Days) 14 A FACE TO FACE EVALUATION WAS PERFORMED  Nekia Maxham E 07/16/2014, 8:23 AM

## 2014-07-16 NOTE — Progress Notes (Signed)
Speech Language Pathology Daily Session Note  Patient Details  Name: [name] MRN: [id number] Date of Birth: January 07, 1926  Today's Date: 07/16/2014 SLP Individual Time: 0830-0930 SLP Individual Time Calculation (min): 60 min  Short Term Goals: Week 2: SLP Short Term Goal 1 (Week 2): Patient will consume Dys.2 textures and nectar-thick liquids via cup with Supervision verbal cues to utilize safe swallow strategies to minimize overt s/s of aspiration. SLP Short Term Goal 2 (Week 2): Patient will demonstrate effective mastication of Dys. 3 textures across 2 consecutive sessions with Min verbal cues for portion control and use of lingual sweeps to minimize overt s/s of aspiration. SLP Short Term Goal 3 (Week 2): Patient will demonstrate readiness for repeat objective swallow assessment with improved managment of oral secreations with no more than 5 cues needed to manage secreations in a 30 minute session. SLP Short Term Goal 4 (Week 2): Patient will produce intelligible speech at the phrase level with Min multimodal cues to utilize compensatory strategies. SLP Short Term Goal 5 (Week 2): Patient will initiate basic self-care tasks with no more than 2 verbal cues.    Skilled Therapeutic Interventions: Skilled treatment session focused on addressing dysphagia and dysarthria goals.  SLP facilitated session with set-up of Dys. 3 textures for advanced trials.  Patient consumed Dys. 3 textures and nectar-thick liquids via cup with timely mastication and Supervision cues to utilize recommended safe swallow strategies.  Recommend diet advancement to Dys. 3 textures with continued need for nectar-thick liquids as well as full supervision with PO.  SLP also provided Min verbal cues to recall and utilize speech intelligibility strategies at the sentence level of verbal expression throughout session.   Continue with current plan of care.    FIM:  Comprehension Comprehension Mode: Auditory Comprehension:  5-Understands complex 90% of the time/Cues < 10% of the time Expression Expression Mode: Verbal Expression: 4-Expresses basic 75 - 89% of the time/requires cueing 10 - 24% of the time. Needs helper to occlude trach/needs to repeat words. Social Interaction Social Interaction: 4-Interacts appropriately 75 - 89% of the time - Needs redirection for appropriate language or to initiate interaction. Problem Solving Problem Solving: 5-Solves basic 90% of the time/requires cueing < 10% of the time Memory Memory: 4-Recognizes or recalls 75 - 89% of the time/requires cueing 10 - 24% of the time  Pain Pain Assessment Pain Assessment: No/denies pain  Therapy/Group: Individual Therapy  Carmelia Roller., CCC-SLP [phone]  [employer] 07/16/2014, 12:44 PM

## 2014-07-16 NOTE — Progress Notes (Signed)
Occupational Therapy Session Note  Patient Details  Name: [name] MRN: [id number] Date of Birth: February 26, 1926  Today's Date: 07/16/2014 OT Individual Time: 1100-1200 OT Individual Time Calculation (min): 60 min    Short Term Goals: Week 2:  OT Short Term Goal 1 (Week 2): Pt will peform UB dressing with Mod A in order to increase I in self care. OT Short Term Goal 2 (Week 2): Pt will perform shower transfer with Max A of 1 in order to increase i in self care. OT Short Term Goal 3 (Week 2): Pt will perform grooming with min A in order to increase I in self care. OT Short Term Goal 4 (Week 2): Pt will perform LB dressing with Max A of 1 in order to increase i in self care.   Skilled Therapeutic Interventions/Progress Updates:    Pt engaged in BADL retaining including bathing at shower level and dressing with sit<>stand from w/c.  Pt performed stand pivot transfer to shower chair with max A and using grab bar in shower.  Pt required assistance with bathing buttocks and max A when standing while holding onto grab bar.  Pt remained in shower chair for transport back into room before performing squat pivot transfer back to w/c.  Pt required max a for sit<>stand from w/c and standing balance while rehab tech assisted with pulling up pants.  Focus on activity tolerance, task initiation, sequencing, sit<>stand, transfers, and safety awareness.  Therapy Documentation Precautions:  Precautions Precautions: Fall Precaution Comments: R hemiplegia, R gaze preference Restrictions Weight Bearing Restrictions: No  Pain: Pain Assessment Pain Score: 5  Pain Type: Acute pain Pain Location: Shoulder Pain Orientation: Right Pain Descriptors / Indicators: Aching Pain Onset: With Activity Pain Intervention(s): RN made aware;Repositioned  See FIM for current functional status  Therapy/Group: Individual Therapy  Leroy Libman 07/16/2014, 12:15 PM

## 2014-07-16 NOTE — Progress Notes (Addendum)
Physical Therapy Session Note  Patient Details  Name: [name] MRN: [id number] Date of Birth: 08/31/1925  Today's Date: 07/16/2014 PT Individual Time: 5284-1324 PT Individual Time Calculation (min): 64 min   Short Term Goals: Week 1:  PT Short Term Goal 1 (Week 1): Pt to perform bed mobility with mod Ax1person PT Short Term Goal 1 - Progress (Week 1): Progressing toward goal PT Short Term Goal 2 (Week 1): Pt to perform bed<>w/c transfer with max Ax1person PT Short Term Goal 2 - Progress (Week 1): Met PT Short Term Goal 3 (Week 1): Pt to maintain dynamic sitting balance with mod Ax1person PT Short Term Goal 3 - Progress (Week 1): Met PT Short Term Goal 4 (Week 1): Pt to propel w/c 75' with mod Ax1person PT Short Term Goal 4 - Progress (Week 1): Progressing toward goal PT Short Term Goal 5 (Week 1): Pt to ambulate 10' with LRAD and Ax2persons PT Short Term Goal 5 - Progress (Week 1): Met       Therapy Documentation Precautions:  Precautions Precautions: Fall Precaution Comments: R hemiplegia, R gaze preference Restrictions Weight Bearing Restrictions: No Pain: Pain Assessment Pain Assessment: No/denies pain   Chest Physical Therapy Evaluation/Treatment performed today as follows:  MEDICAL DIAGNOSIS: CVA CHEST PT DIAGNOSIS: poor secretion management and mobilization  PRECAUTIONS: cardiac MD ORDERS: Chest PT CHART REVIEW:  Chest Imaging: 12/20 Bibasilar mild interstitial thickening which may reflect bibasilar pneumonia versus aspiration pneumonia. Intubation/Trach/Vent none  Past Medical History:  SUBJECTIVE: " I am tired today" FUNCTIONAL STATUS: maximum assist for all functional activities. COGNITIVE STATUS: Alert and oriented x3  OBSERVATION: Breathing 75/25 anterior chest/diaphragm with initiation at anterior chest; 1:2 inspiration: expiration, shallow breathing with cues for appropriate technique. Accessory muscle use noted at anterior scalene bilaterally and  trapezius Forward head, rounded shoulders, increase in thoracic kyphosis. Skin Integrity - skin turgor poor Bilateral UE, Bilateral UE cool distally, warm proximally, pink with (-) cyanosis in fingers, (+) capillary refill, (-) clubbing,  Lines - n/a O2 use: none Chest PT Assessment Intervention: postural correction with focus on promoting effective inspiration/expiration ASSESSMENT INTERVENTION: Pre-intervention BP 118/68 HR 68 SaO2 93% on room air, RR: 16 Sputum: no sputum noted  Cough Spontaneous, wet, weak, nonproductive (at time of evaluation).  Auscultation in short sit (anterior fields) Right decreased breath sounds on inspiration Left normal, (posterior fields) L upper/lower lobe: decreased breath sounds; R upper lobe & R middle lobe: decreased breath sounds on inspiration, R lower lobe: absent breath sounds on inspiration.  INTERVENTION: diaphragmatic breathing, modified postural drainage (side lying with quarter turn anterior) and percussion 3x6mnutes bilaterally; manual vibration right sidelying three trials, increase in sputum production noted.    POST INTERVENTION: seated at EOB Post intervention BP 108/66 HR 74 O2 96% RR 12 Sputum: copious amounts, moderate thickness, yellow, no odor noted.  COUGH: spontaneous, wet, weak, productive Auscultation in short sit: Improved breath sounds throughout however decreased on inspiration throughout all lobes. Intermittent rhonchi noted on expiration on right upper lobe. ASSESSMENT: Patient responded well to percussion with rest breaks and re-education needed throughout the session. He demonstrated increased cough frequency and sputum production with interventions. He would benefit from continued chest PT including percussion and vibration in order to improve ventilation and perfusion, as well as secretion mobilization and patient comfort in order to improve functional abilities.  PLAN OF CARE: Diaphragmatic breathing, postural drainage  (positioning), spirometry, percussion,  huffing, tai chi, active cycle breathing  He would benefit from further skilled  chest physical therapy to maximize efficiency of breathing. PRECAUTIONS/CONTRAINDICATIONS TO CHEST PT: cardiac  See FIM for current functional status  Therapy/Group: Individual Therapy  Retta Diones 07/16/2014, 4:00 PM

## 2014-07-17 ENCOUNTER — Inpatient Hospital Stay (HOSPITAL_COMMUNITY): Payer: Medicare Other | Admitting: Physical Therapy

## 2014-07-17 ENCOUNTER — Inpatient Hospital Stay (HOSPITAL_COMMUNITY): Payer: Medicare Other | Admitting: *Deleted

## 2014-07-17 ENCOUNTER — Inpatient Hospital Stay (HOSPITAL_COMMUNITY): Payer: Medicare Other

## 2014-07-17 ENCOUNTER — Inpatient Hospital Stay (HOSPITAL_COMMUNITY): Payer: Medicare Other | Admitting: Speech Pathology

## 2014-07-17 MED ORDER — PRO-STAT SUGAR FREE PO LIQD
30.0000 mL | Freq: Three times a day (TID) | ORAL | Status: DC
  Administered 2014-07-17 – 2014-07-24 (×17): 30 mL via ORAL
  Filled 2014-07-17 (×25): qty 30

## 2014-07-17 NOTE — Progress Notes (Addendum)
NUTRITION FOLLOW UP  INTERVENTION: Provide Magic cup TID between meals, each supplement provides 290 kcal and 9 grams of protein. Ordered.  Continue Ensure Pudding po TID, each supplement provides 170 kcal and 4 grams of protein.  Provide 30 ml Prostat po TID, each supplement provides 100 kcal and 15 grams of protein.  Encourage adequate PO intake.   NUTRITION DIAGNOSIS: Increased nutrient needs related to chronic illness as evidenced by estimated nutrition needs; ongoing  Goal: Pt to meet >/= 90% of their estimated nutrition needs; not met  Monitor:  PO intake, weight trends, labs, I/O's  80 y.o. male  Admitting Dx: Left pontine stroke  ASSESSMENT: Pt with history of HTN, prostate cancer, recent PNA with malaise X 1 week who was admitted on 06/27/14 with right sided weakness with right gaze preference and difficulty talking. Patient was noted to be in A fib in setting of CAP. EKG evaluated by cardiology and felt to have multiple artifacts with 1 degree AVB and repeat EKG with NSR.   Meal completion has recently been 15-25%. Pt has been advanced to a dysphagia 3 diet. He reports now that there are no food options, his po intake will improve. Pt has been taking his supplements. Will continue with current interventions, however will increase Prostat as recent po intake has been poor. Pt was encouraged to eat his food at meals and to take his supplements.   Nutrition Focused Physical Exam:  Subcutaneous Fat:  Orbital Region: N/A Upper Arm Region: Moderate depletion Thoracic and Lumbar Region: WNL  Muscle:  Temple Region: N/A Clavicle Bone Region: Moderate depletion Clavicle and Acromion Bone Region: Moderate depletion Scapular Bone Region: N/A Dorsal Hand: N/A Patellar Region: Moderate depletion Anterior Thigh Region: Moderate depletion Posterior Calf Region: Moderate depletion  Edema: non-pitting RUE  Height: Ht Readings from Last 1 Encounters:  07/03/14 _0  (1.753  m)    Weight: Wt Readings from Last 1 Encounters:  07/17/14 134 lb 11.2 oz (61.1 kg)    BMI:  Body mass index is 19.88 kg/(m^2).  Re-Estimated Nutritional Needs: Kcal: 1750-1950 Protein: 80-90 grams Fluid: 1.75- 1.95 L/day  Skin: wound on head, non-pitting RLE edema  Diet Order: DIET DYS 3 with nectar thick liquids   Intake/Output Summary (Last 24 hours) at 07/17/14 1543 Last data filed at 07/17/14 1000  Gross per 24 hour  Intake  538.6 ml  Output      0 ml  Net  538.6 ml    Last BM: 12/20  Labs:   Recent Labs Lab 07/13/14 0600  CREATININE 0.66    CBG (last 3)  No results for input(s): GLUCAP in the last 72 hours.  Scheduled Meds: . amoxicillin-clavulanate  875 mg Oral Q12H  . antiseptic oral rinse  7 mL Mouth Rinse QID  . aspirin  325 mg Oral Daily  . atorvastatin  20 mg Oral q1800  . benzonatate  100 mg Oral TID  . enoxaparin (LOVENOX) injection  40 mg Subcutaneous Q24H  . feeding supplement (ENSURE)  1 Container Oral TID WC  . feeding supplement (PRO-STAT SUGAR FREE 64)  30 mL Oral BID BM  . magic mouthwash  10 mL Oral TID  . polycarbophil  625 mg Oral BID  . saccharomyces boulardii  250 mg Oral BID  . white petrolatum   Topical BID    Continuous Infusions: . sodium chloride Stopped (07/17/14 0636)    Past Medical History  Diagnosis Date  . Hypertension   . Cancer  Past Surgical History  Procedure Laterality Date  . Appendectomy    . Kidney surgery      Kallie Locks, MS, RD, LDN Pager # [phone] After hours/ weekend pager # [phone]

## 2014-07-17 NOTE — Progress Notes (Signed)
Occupational Therapy Session Note  Patient Details  Name: [name] MRN: [id number] Date of Birth: [birth year]  Today's Date: 07/17/2014 OT Individual Time: 1100-1205 OT Individual Time Calculation (min): 65 min    Short Term Goals: Week 2:  OT Short Term Goal 1 (Week 2): Pt will peform UB dressing with Mod A in order to increase I in self care. OT Short Term Goal 2 (Week 2): Pt will perform shower transfer with Max A of 1 in order to increase i in self care. OT Short Term Goal 3 (Week 2): Pt will perform grooming with min A in order to increase I in self care. OT Short Term Goal 4 (Week 2): Pt will perform LB dressing with Max A of 1 in order to increase i in self care.   Skilled Therapeutic Interventions/Progress Updates: ADL-retraining with focus on transfers, adapted bathing/dressing skills, improved awareness and attention.   Pt received seated in w/c awaiting therapist with daughter present.   Pt receptive for bathing in shower using wheeled shower chair aware of need for assist with managing right upper and lower extremity.   Pt escorted to bathroom in w/c and was re-educated on right leg carry method using left leg to hook and lift.   Pt completed stand-pivot transfer to wheeled shower chair with max assist to weight shift to supported RLE and verbal cues to advance left leg.   Pt required mod assist to shower thoroughly and verbal cues to attend to right UE.   After total assist to lace both brief and pants to his knees, pt performed supported sit>stand and maintained position for 20 seconds with max assist while therapist pulled up brief and pants.   Pt completed transfer to w/c again with manual facilitation to weight shift to left while therapist advanced right foot to position.   Pt dressed upper body and groomed seated in w/c with family in room assisting for thoroughness.   Pt advised to use "pre-electric" shave lotion to improve thoroughness with shaving using his electric razor.     Pt left in w/c with QRB attached, half tray positioned for noon meal and family present to assist, as needed.     Therapy Documentation Precautions:  Precautions Precautions: Fall Precaution Comments: R hemiplegia, R gaze preference Restrictions Weight Bearing Restrictions: No  Pain: No/denies pain   See FIM for current functional status  Therapy/Group: Individual Therapy  [place] 07/17/2014, 12:19 PM

## 2014-07-17 NOTE — Progress Notes (Signed)
Occupational Therapy Session Note  Patient Details  Name: [name] MRN: [id number] Date of Birth: 10-09-1925  Today's Date: 07/17/2014 OT Individual Time: 4656-8127 OT Individual Time Calculation (min): 30 min    Short Term Goals: Week 2:  OT Short Term Goal 1 (Week 2): Pt will peform UB dressing with Mod A in order to increase I in self care. OT Short Term Goal 2 (Week 2): Pt will perform shower transfer with Max A of 1 in order to increase i in self care. OT Short Term Goal 3 (Week 2): Pt will perform grooming with min A in order to increase I in self care. OT Short Term Goal 4 (Week 2): Pt will perform LB dressing with Max A of 1 in order to increase i in self care.   Skilled Therapeutic Interventions/Progress Updates:    Pt seen for 1:1 OT session with focus on RUE NMR. Pt received sitting in w/c. Engaged AAROM using therapeutic arm board with focus on shoulder adduction/abduction and protraction/retraction. Pt with slight active movement noted. Provided PROM to elbow, wrist and digits with emphasis on decreasing edema in digits. Provided GivMohr sling to be utilized in standing with therapy for protection of shoulder joint. Pt returned to room and left with all needs in reach.   Therapy Documentation Precautions:  Precautions Precautions: Fall Precaution Comments: R hemiplegia, R gaze preference Restrictions Weight Bearing Restrictions: No General:   Vital Signs:  Pain: No report of pain during therapy session.  See FIM for current functional status  Therapy/Group: Individual Therapy  Duayne Cal 07/17/2014, 2:43 PM

## 2014-07-17 NOTE — Progress Notes (Signed)
Speech Language Pathology Daily Session Note  Patient Details  Name: [name] MRN: [id number] Date of Birth: 1926-03-24  Today's Date: 07/17/2014 SLP Individual Time: 8115-7262 SLP Individual Time Calculation (min): 30 min  Short Term Goals: Week 3: SLP Short Term Goal 1 (Week 3): Patient will consume Dys.3 textures and nectar-thick liquids via cup with Supervision verbal cues to utilize safe swallow strategies to minimize overt s/s of aspiration. SLP Short Term Goal 2 (Week 3): Patient will demonstrate readiness for repeat objective swallow assessment with improved managment of oral secreations with no more than 5 cues needed to manage secreations in a 30 minute session. SLP Short Term Goal 3 (Week 3): Patient will produce intelligible speech at the phrase level with Min multimodal cues to utilize compensatory strategies.  Skilled Therapeutic Interventions:   Pt was seen for skilled ST targeting dysphagia goals.  Upon arrival, pt was seated upright in wheelchair, awake, alert, and agreeable to participate in [place].  SLP facilitated the session with skilled education related to pt's currently prescribed textures in a functional context (i.e. selecting diet appropriate foods at the rehab holiday party).  Additionally, SLP completed skilled observations with presentations of pt's currently prescribed diet with pt exhibiting immediate cough x2 on drier, crumbly textures.  No other s/s of aspiration were noted.  Continue per current plan of care.     FIM:  Comprehension Comprehension Mode: Auditory Comprehension: 5-Follows basic conversation/direction: With extra time/assistive device Expression Expression Mode: Verbal Expression: 4-Expresses basic 75 - 89% of the time/requires cueing 10 - 24% of the time. Needs helper to occlude trach/needs to repeat words. Social Interaction Social Interaction: 4-Interacts appropriately 75 - 89% of the time - Needs redirection for appropriate language or to  initiate interaction. Problem Solving Problem Solving: 4-Solves basic 75 - 89% of the time/requires cueing 10 - 24% of the time Memory Memory: 4-Recognizes or recalls 75 - 89% of the time/requires cueing 10 - 24% of the time FIM - Eating Eating Activity: 5: Needs verbal cues/supervision  Pain Pain Assessment Pain Assessment: No/denies pain  Therapy/Group: Individual Therapy  [name], [name] 07/17/2014, 4:52 PM

## 2014-07-17 NOTE — Progress Notes (Signed)
Subjective/Complaints: 80 y.o. left handed male with history of HTN, prostate cancer, recent PNA with malaise X 1 week who was admitted on 06/27/14 with right sided weakness with right gaze preference and difficulty talking. Patient was noted to be in A fib in setting of CAP and CT head without acute changes. EKG evaluated by cardiology and felt to have multiple artifacts with 1 degree AVB and repeat EKG with NSR. NIHSS- 14. 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   No further fever Afeb Review of Systems - Negative except weakness on right side Objective: Vital Signs: Blood pressure 156/60, pulse 63, temperature 98 F (36.7 C), temperature source Axillary, resp. rate 18, height 5\' 9"  (1.753 m), weight 61.1 kg (134 lb 11.2 oz), SpO2 96 %. Dg Chest 2 View  07/15/2014   CLINICAL DATA:  Persistent cough for 2 weeks, no chest complaints at this time. No shortness of breath.  EXAM: CHEST  2 VIEW  COMPARISON:  07/09/2014, 09/20/2009  FINDINGS: Bibasilar mild interstitial thickening which may reflect bibasilar pneumonia including aspiration pneumonia. There is no pleural effusion or pneumothorax. The heart and mediastinal contours are unremarkable.  There is mild thoracic spine spondylosis.  IMPRESSION: Bibasilar mild interstitial thickening which may reflect bibasilar pneumonia versus aspiration pneumonia.   Electronically Signed   By: Kathreen Devoid   On: 07/15/2014 09:49   Results for orders placed or performed during the hospital encounter of 07/02/14 (from the past 72 hour(s))  CBC with Differential     Status: Abnormal   Collection Time: 07/16/14  8:52 AM  Result Value Ref Range   WBC 12.2  (H) 4.0 - 10.5 K/uL   RBC 4.07 (L) 4.22 - 5.81 MIL/uL   Hemoglobin 11.4 (L) 13.0 - 17.0 g/dL   HCT 35.5 (L) 39.0 - 52.0 %   MCV 87.2 78.0 - 100.0 fL   MCH 28.0 26.0 - 34.0 pg   MCHC 32.1 30.0 - 36.0 g/dL   RDW 13.2 11.5 - 15.5 %   Platelets 387 150 - 400 K/uL   Neutrophils Relative % 80 (H) 43 - 77 %   Neutro Abs 9.7 (H) 1.7 - 7.7 K/uL   Lymphocytes Relative 10 (L) 12 - 46 %   Lymphs Abs 1.2 0.7 - 4.0 K/uL   Monocytes Relative 9 3 - 12 %   Monocytes Absolute 1.1 (H) 0.1 - 1.0 K/uL   Eosinophils Relative 1 0 - 5 %   Eosinophils Absolute 0.1 0.0 - 0.7 K/uL   Basophils Relative 0 0 - 1 %   Basophils Absolute 0.1 0.0 - 0.1 K/uL     HEENT: normal, no evidence of thrush Cardio: RRR Resp: CTA B/L, no resp distress GI: BS positive and NT,ND Extremity:  Edema Right hand and Right foot Skin:   Wound vertex of scalp, eschar Neuro: Lethargic, Cranial Nerve Abnormalities Right central 7, Abnormal Sensory reduced on left side to LT, Abnormal Motor 0/5 on right side except trace Right foot plantar flexion and Tone:  Hypotonia Musc/Skel:  Other no pain with Right shoulder ROM Gen NAD   Assessment/Plan: 1. Functional deficits secondary to left paramedian pontine infarct which require 3+ hours per day of interdisciplinary therapy in a comprehensive inpatient rehab setting. Physiatrist  is providing close team supervision and 24 hour management of active medical problems listed below. Physiatrist and rehab team continue to assess barriers to discharge/monitor patient progress toward functional and medical goals. Marland KitchenFIM: FIM - Bathing Bathing Steps Patient Completed: Chest, Right Arm, Abdomen, Right upper leg, Left upper leg Bathing: 3: Mod-Patient completes 5-7 98f 10 parts or 50-74%  FIM - Upper Body Dressing/Undressing Upper body dressing/undressing steps patient completed: Thread/unthread left sleeve of pullover shirt/dress, Put head through opening of pull over shirt/dress Upper body  dressing/undressing: 3: Mod-Patient completed 50-74% of tasks FIM - Lower Body Dressing/Undressing Lower body dressing/undressing steps patient completed: Thread/unthread left pants leg Lower body dressing/undressing: 1: Two helpers  FIM - Toileting Toileting: 1: Total-Patient completed zero steps, helper did all 3  FIM - Radio producer Devices: Bedside commode Toilet Transfers: 1-From toilet/BSC: Total A (helper does all/Pt. < 25%)  FIM - Bed/Chair Transfer Bed/Chair Transfer Assistive Devices: Arm rests Bed/Chair Transfer: 3: Chair or W/C > Bed: Mod A (lift or lower assist), 3: Bed > Chair or W/C: Mod A (lift or lower assist), 2: Supine > Sit: Max A (lifting assist/Pt. 25-49%)  FIM - Locomotion: Wheelchair Distance: 125' Locomotion: Wheelchair: 0: Activity did not occur FIM - Locomotion: Ambulation Locomotion: Ambulation Assistive Devices:  (three Advice worker) Ambulation/Gait Assistance: 1: +2 Total assist Locomotion: Ambulation: 0: Activity did not occur  Comprehension Comprehension Mode: Auditory Comprehension: 5-Understands basic 90% of the time/requires cueing < 10% of the time  Expression Expression Mode: Verbal Expression: 4-Expresses basic 75 - 89% of the time/requires cueing 10 - 24% of the time. Needs helper to occlude trach/needs to repeat words.  Social Interaction Social Interaction: 4-Interacts appropriately 75 - 89% of the time - Needs redirection for appropriate language or to initiate interaction.  Problem Solving Problem Solving: 5-Solves basic 90% of the time/requires cueing < 10% of the time  Memory Memory: 4-Recognizes or recalls 75 - 89% of the time/requires cueing 10 - 24% of the time   Medical Problem List and Plan: 1. Functional deficits secondary to left paramedian pontine infarct 2. DVT Prophylaxis/Anticoagulation: Pharmaceutical: Lovenox 3. Pain Management: N/A 4. Mood: LCSW to follow for evaluation and  support.  5. Neuropsych: This patient is not capable of making decisions on his own behalf. 6. Skin/Wound Care:hx melanoma removal scalp, resume vaseline,healing very well eschar is off dermatologist Routine pressure relief measures. Turn patient every 2 hours when in bed.  7. Fluids/Electrolytes/Nutrition: Monitor I/O. Check daily weights to monitor for signs of overload.  8. CAP: Recurrent was off abx for several days with normalization of WBCs, WBC stable at 12, improved from 17K                                                                   9. Acute renal insufficiency: Improved. Off HCTZ at this time. 10. HTN: Will monitor every 8 hours and allow permissive HTN for adequate perfusion. 11.  Leucocytosis:improved on last CBC will recheck ,afeb , WBC stable d/c abx 12. Thrush: resolved  LOS (Days) 15 A FACE TO FACE EVALUATION WAS PERFORMED  [name] E 07/17/2014, 8:17 AM

## 2014-07-17 NOTE — Progress Notes (Signed)
Physical Therapy Weekly Progress Note  Patient Details  Name: [name] MRN: [id number] Date of Birth: [birth year]  Beginning of progress report period: July 09, 2014 End of progress report period: July 17, 2014  Today's Date: 07/17/2014 PT Individual Time: 0930-1030 PT Individual Time Calculation (min): 60 min   Patient has met 2 of 5 short term goals.  Pt making slow but steady progress with all aspects of mobility.  Note marked improvement in upright posture, transfers and ability to remain upright during standing and gait.  Also have noted more active movement in RLE during standing and gait.  Continue to focus on stretching all of spine and hips during every session, forward weight shifts, transfers and gait.  Continue to feel he will need higher level of care when he leaves hospital, but wife willing to hire help for home.   Patient continues to demonstrate the following deficits: decreased balance, decreased postural control, decreased functional movement and strength in RUE/LE, decreased attention, decreased memory, and decreased awareness and therefore will continue to benefit from skilled PT intervention to enhance overall performance with activity tolerance, balance, postural control, ability to compensate for deficits, functional use of  right upper extremity and right lower extremity, attention, awareness, coordination and knowledge of precautions.  Patient progressing toward long term goals..  Continue plan of care.  PT Short Term Goals Week 2:  PT Short Term Goal 1 (Week 2): Pt to perform bed mobility with mod Ax1person PT Short Term Goal 1 - Progress (Week 2): Progressing toward goal PT Short Term Goal 2 (Week 2): Pt to perform bed<>w/c transfer with mod Ax1person PT Short Term Goal 2 - Progress (Week 2): Met PT Short Term Goal 3 (Week 2): Pt to maintain dynamic sitting balance with min Ax1person PT Short Term Goal 3 - Progress (Week 2): Progressing toward goal PT  Short Term Goal 4 (Week 2): Pt to propel w/c 68' with mod Ax1person PT Short Term Goal 4 - Progress (Week 2): Progressing toward goal PT Short Term Goal 5 (Week 2): Pt to ambulate 25' with LRAD and Ax2persons PT Short Term Goal 5 - Progress (Week 2): Met Week 3:  PT Short Term Goal 1 (Week 3): Pt to perform bed mobility with mod Ax1person PT Short Term Goal 2 (Week 3): Pt to perform bed<>w/c transfer with min Ax1person PT Short Term Goal 3 (Week 3): Pt to maintain dynamic sitting balance with min Ax1person PT Short Term Goal 4 (Week 3): Pt to propel w/c 15' with mod Ax1person PT Short Term Goal 5 (Week 3): Pt to ambulate 25' with LRAD and Ax2persons  Skilled Therapeutic Interventions/Progress Updates:   Pt received lying in bed, agreeable to therapy session.  Daughter, [name] present to observe during session.  States that he did not have very much more coughing yesterday afternoon.  Explained chest PT to daughter and reasoning behind it.  Verbalized understanding.  Assisted pt with donning socks and pants prior to getting to EOB.  Requires mod A for rolling, esp to the L with assist for shoulders and R LE for knee flexion.  Pt able to keep LEs stacked and lowered both off of bed.  Mod A and max cues for using RUE to elevate trunk.  Continues to want to grab onto therapist.  Pt requires min A in order to maintain sitting while daughter assisted with donning shoes.  Transferred bed>w/c and w/c>mat at mod A level to the R with continued facilitation for forward weight  shift and pushing through LEs instead of extending trunk and pushing.  Focused on stretching of cervical, thoracic, and lumbar spine, hamstring stretch BLE, and knee to hip stretch BLEs prior to upright mobility.  Ambulated in hallway x 50' x 1 and another 16' x 1 with "three muskateer style" +2 assist.  Note marked improvement in upright support and pt was no longer "hanging" on therapists.  Assist initially for larger step length on LLE, but pt  then able to perform on his own.  Assisted with upright head/trunk, anterior pelvic tilt, advancing RLE, however pt progressed to being able to initiate step and also demonstrate quad control with tapping during R stance.  Daughter pleased with progress during gait.  Pt left in w/c in room with all needs in reach and quick release belt donned.   Therapy Documentation Precautions:  Precautions Precautions: Fall Precaution Comments: R hemiplegia, R gaze preference Restrictions Weight Bearing Restrictions: No  Pain: Pt with no c/o pain during session.    Locomotion : Ambulation Ambulation/Gait Assistance: 1: +2 Total assist Wheelchair Mobility Distance: 25  See FIM for current functional status  Therapy/Group: Individual Therapy  [name] 07/17/2014, 12:04 PM

## 2014-07-17 NOTE — Progress Notes (Signed)
Speech Language Pathology Weekly Progress and Session Note  Patient Details  Name: [name] MRN: [id number] Date of Birth: [birth year]  Beginning of progress report period: July 10, 2014 End of progress report period: July 17, 2014   Short Term Goals: Week 2: SLP Short Term Goal 1 (Week 2): Patient will consume Dys.2 textures and nectar-thick liquids via cup with Supervision verbal cues to utilize safe swallow strategies to minimize overt s/s of aspiration. SLP Short Term Goal 1 - Progress (Week 2): Met SLP Short Term Goal 2 (Week 2): Patient will demonstrate effective mastication of Dys. 3 textures across 2 consecutive sessions with Min verbal cues for portion control and use of lingual sweeps to minimize overt s/s of aspiration. SLP Short Term Goal 2 - Progress (Week 2): Met SLP Short Term Goal 3 (Week 2): Patient will demonstrate readiness for repeat objective swallow assessment with improved managment of oral secreations with no more than 5 cues needed to manage secreations in a 30 minute session. SLP Short Term Goal 3 - Progress (Week 2): Progressing toward goal SLP Short Term Goal 4 (Week 2): Patient will produce intelligible speech at the phrase level with Min multimodal cues to utilize compensatory strategies. SLP Short Term Goal 4 - Progress (Week 2): Progressing toward goal SLP Short Term Goal 5 (Week 2): Patient will initiate basic self-care tasks with no more than 2 verbal cues.   SLP Short Term Goal 5 - Progress (Week 2): Met    New Short Term Goals: Week 3: SLP Short Term Goal 1 (Week 3): Patient will consume Dys.3 textures and nectar-thick liquids via cup with Supervision verbal cues to utilize safe swallow strategies to minimize overt s/s of aspiration. SLP Short Term Goal 2 (Week 3): Patient will demonstrate readiness for repeat objective swallow assessment with improved managment of oral secreations with no more than 5 cues needed to manage secreations in a 30  minute session. SLP Short Term Goal 3 (Week 3): Patient will produce intelligible speech at the phrase level with Min multimodal cues to utilize compensatory strategies.  Weekly Progress Updates: Patient has made functional gains and has met 3 out of 5 short term goals this reporting period due to improved use of safe swallow strategies and efficient mastication of Dys. 3 textures as well as more timely initiation of basic self-care tasks.  Patient has also made functional gains in management of oral secretions and overall speech intelligibility; however, he has not consistently met the goals, therefore they will be continued in the next reporting period.  Currently, patient continues to require Min-Mod assist for use of safe swallow and speech intelligibility strategies.  Patient and family education is ongoing.  Patient would benefit from continued skilled SLP intervention to maximize overall functional independence in order to reduce burden of care prior to discharge home with wife and hired caregiver.    Intensity: Minumum of 1-2 x/day, 30 to 90 minutes Frequency: 5 out of 7 days Duration/Length of Stay: 21-24 days Treatment/Interventions: Cognitive remediation/compensation;Cueing hierarchy;Dysphagia/aspiration precaution training;Environmental controls;Functional tasks;Internal/external aids;Oral motor exercises;Patient/family education;Speech/Language facilitation;Therapeutic Activities   Carmelia Roller., CCC-SLP [phone]  [place] 07/17/2014, 4:36 PM

## 2014-07-18 ENCOUNTER — Ambulatory Visit (HOSPITAL_COMMUNITY): Payer: Medicare Other | Admitting: Rehabilitation

## 2014-07-18 ENCOUNTER — Inpatient Hospital Stay (HOSPITAL_COMMUNITY): Payer: Medicare Other | Admitting: Physical Therapy

## 2014-07-18 ENCOUNTER — Inpatient Hospital Stay (HOSPITAL_COMMUNITY): Payer: Medicare Other | Admitting: Speech Pathology

## 2014-07-18 ENCOUNTER — Inpatient Hospital Stay (HOSPITAL_COMMUNITY): Payer: Medicare Other | Admitting: Occupational Therapy

## 2014-07-18 MED ORDER — ACETAMINOPHEN 325 MG PO TABS: 650.0000 mg | ORAL_TABLET | Freq: Four times a day (QID) | ORAL | Status: DC | PRN

## 2014-07-18 NOTE — Progress Notes (Signed)
Speech Language Pathology Daily Session Note  Patient Details  Name: [name] MRN: [id number] Date of Birth: 1925-11-25  Today's Date: 07/18/2014 SLP Individual Time: 2774-1287 SLP Individual Time Calculation (min): 30 min  Short Term Goals: Week 3: SLP Short Term Goal 1 (Week 3): Patient will consume Dys.3 textures and nectar-thick liquids via cup with Supervision verbal cues to utilize safe swallow strategies to minimize overt s/s of aspiration. SLP Short Term Goal 2 (Week 3): Patient will demonstrate readiness for repeat objective swallow assessment with improved managment of oral secreations with no more than 5 cues needed to manage secreations in a 30 minute session. SLP Short Term Goal 3 (Week 3): Patient will produce intelligible speech at the phrase level with Min multimodal cues to utilize compensatory strategies.  Skilled Therapeutic Interventions:  Pt was seen for skilled ST targeting speech intelligibility goals.  Upon arrival, pt was partially reclined in bed, asleep, but easily awakened to voice and light touch.  SLP repositioned pt to be sitting as upright as possible in bed to maximize alertness, attention,and breath support for speech during structured therapeutic tasks.  Pt returned demonstration of oral motor exercises targeting improved labial seal to contain anterior loss of saliva with overall mod assist instructional cues.  No overt s/s of aspiration were noted with presentations of nectar thick liquids via cup.  Pt was ~75% intelligible in functional conversations with min cues for repetition, overarticulation, and increased volume.  Continue per current plan of care.    FIM:  Comprehension Comprehension Mode: Auditory Comprehension: 5-Follows basic conversation/direction: With extra time/assistive device Expression Expression Mode: Verbal Expression: 4-Expresses basic 75 - 89% of the time/requires cueing 10 - 24% of the time. Needs helper to occlude trach/needs  to repeat words. Social Interaction Social Interaction: 4-Interacts appropriately 75 - 89% of the time - Needs redirection for appropriate language or to initiate interaction. Problem Solving Problem Solving: 4-Solves basic 75 - 89% of the time/requires cueing 10 - 24% of the time Memory Memory: 4-Recognizes or recalls 75 - 89% of the time/requires cueing 10 - 24% of the time FIM - Eating Eating Activity: 5: Needs verbal cues/supervision  Pain Pain Assessment Pain Assessment: No/denies pain  Therapy/Group: Individual Therapy  Luwanda Starr, Selinda Orion 07/18/2014, 4:21 PM

## 2014-07-18 NOTE — Progress Notes (Signed)
Social Work Patient ID: [name], male   DOB: 1925/11/29, 80 y.o.   MRN: [id number] Met with pt, wife and daughter to discuss team conference progression toward his goals-has done very well this week and he feels he is on the upswing now. Wife feels he had a delay due to pneumonia and being septic, now is feeling better and feels chest PT is helping.  Have discussed home with hired caregivers versus  [facility] for the therapies and intensity. Pt and wife would like him to go home but will think about the alternative options.  Will continue to work on discharge plans.

## 2014-07-18 NOTE — Progress Notes (Signed)
Occupational Therapy Weekly Progress Note  Patient Details  Name: [name] MRN: [id number] Date of Birth: June 01, 1926  Beginning of progress report period: July 11, 2014 End of progress report period: July 19, 2014  Today's Date: 07/19/2014 OT Individual Time: 2197-5883 OT Individual Time Calculation (min): 60 min    Patient has met 3 of 4 short term goals. Pt making nice progress this week towards goals. Pt with increased motivation this week for therapy sessions. Pt making significant progress towards hemiplegic UB dressing technique. Transfers being performed with assist of 1 person with pt initiating more movement on his own and less pushing occuring.   Patient continues to demonstrate the following deficits: decreased R UE/LE ROM and strength, decreased dynamic/static standing balance, decreased self care, decreased safety awareness, decreased ability to weight shift and therefore will continue to benefit from skilled OT intervention to enhance overall performance with BADL.  Patient progressing toward long term goals..  Continue plan of care. Pt continues to benefit from OT services.  OT Short Term Goals Week 2:  OT Short Term Goal 1 (Week 2): Pt will peform UB dressing with Mod A in order to increase I in self care. OT Short Term Goal 1 - Progress (Week 2): Met OT Short Term Goal 2 (Week 2): Pt will perform shower transfer with Max A of 1 in order to increase i in self care. OT Short Term Goal 2 - Progress (Week 2): Met OT Short Term Goal 3 (Week 2): Pt will perform grooming with min A in order to increase I in self care. OT Short Term Goal 3 - Progress (Week 2): Met OT Short Term Goal 4 (Week 2): Pt will perform LB dressing with Max A of 1 in order to increase i in self care.  OT Short Term Goal 4 - Progress (Week 2): Progressing toward goal Week 3:  OT Short Term Goal 1 (Week 3): Patient's caregiver will demonstrate proper technique of PROM to R UE for stretching.   OT Short Term Goal 2 (Week 3): Pt will perform LB bathing and dressing with Max A of 1 in order to decrease level of assistance for self care.  OT Short Term Goal 3 (Week 3): Pt will perform toilet transfer with Mod A in order to decrease assistance in functional transfers. OT Short Term Goal 4 (Week 3): Pt will perform shower transfer with Mod A in order to decrease assistance in functional transfers.  Skilled Therapeutic Interventions/Progress Updates:  Upon entering room, pt in bed with head up and CNA present for breakfast. Pt agreeing to finish food after B & D session. Supine > sit to EOB with Mod A this session. Squat pivot transfer max of 1 to wheeled shower chair. Bathing at shower level this session with Mod A with pt attempting to utilize bilateral hands in [facility] technique in order to incorporate hemiplegic side in functional task. Pt continues to require assist of second helper for clothing management with therapist standing with pt for balance and support. Pt requiring verbal cues for weight shifting to R side and posture with head up while in standing position. Pt requiring Min A to thread R UE into pull over shirt this session. Pt seated in wheelchair with QRB and arm tray donned. OT encouraged pt to utilize call bell in order to have food warmed up and to finish eating. Pt did so and speech very clear to staff with request. Call bell left within reach of pt upon exiting  the room.   Therapy Documentation Precautions:  Precautions Precautions: Fall Precaution Comments: R hemiplegia, R gaze preference Restrictions Weight Bearing Restrictions: No  See FIM for current functional status  Therapy/Group: Individual Therapy  [name] 07/18/2014, 12:33 PM

## 2014-07-18 NOTE — Progress Notes (Signed)
Physical Therapy Session Note  Patient Details  Name: [name] MRN: [id number] Date of Birth: 05-31-1926  Today's Date: 07/18/2014 PT Individual Time: 1000-1100 PT Individual Time Calculation (min): 60 min   Short Term Goals: Week 3:  PT Short Term Goal 1 (Week 3): Pt to perform bed mobility with mod Ax1person PT Short Term Goal 2 (Week 3): Pt to perform bed<>w/c transfer with min Ax1person PT Short Term Goal 3 (Week 3): Pt to maintain dynamic sitting balance with min Ax1person PT Short Term Goal 4 (Week 3): Pt to propel w/c 43' with mod Ax1person PT Short Term Goal 5 (Week 3): Pt to ambulate 25' with LRAD and Ax2persons  Skilled Therapeutic Interventions/Progress Updates:   Pt received from OT sitting in w/c, agreeable to therapy session.  First half of session focused on chest PT follow up.  See template below for full details.  Remainder of session focused on upright mobility with gait to focus on upright posture, weight shift L for better clearance of RLE, weight shift forward and over RLE during R stance and activity tolerance.  Pt provided only HHA on L with PT on R still under axilla for increased trunk support for upright posture and to facilitate increased anterior pelvic tilt.  Also provided assist for deficits mentioned above.  Pt with R AFO for DF assist and shoe cover for increased ease of advancing RLE.  Note that he is able to clear RLE initially, however with fatigue, require assist.  Also continue to note he is able to contract quad during R stance.  Did begin to note some hyperextension and plan to address during next visit.    Chest Physical Therapy Treatment performed today as follows:  MEDICAL DIAGNOSIS: CVA CHEST PT DIAGNOSIS: poor secretion management and mobilization  PRECAUTIONS: cardiac MD ORDERS: Chest PT CHART REVIEW:  Chest Imaging: 12/20 Bibasilar mild interstitial thickening which may reflect bibasilar pneumonia versus aspiration  pneumonia. Intubation/Trach/Vent none  Past Medical History:  SUBJECTIVE: " I'm feeling better today" FUNCTIONAL STATUS: mod assist for all functional activities. COGNITIVE STATUS: Alert and oriented x3  OBSERVATION: Breathing 75/25 anterior chest/diaphragm with initiation at anterior chest; 1:2 inspiration: expiration, shallow breathing with cues for appropriate technique. Accessory muscle use noted at anterior scalene bilaterally and trapezius Forward head, rounded shoulders, increase in thoracic kyphosis. Skin Integrity - skin turgor poor Bilateral UE, Bilateral UE cool distally, warm proximally, pink with (-) cyanosis in fingers, (+) capillary refill, (-) clubbing,  Lines - n/a O2 use: none Chest PT Assessment Intervention: postural correction with focus on promoting effective inspiration/expiration ASSESSMENT INTERVENTION: Pre-intervention HR 71 SaO2 98% on room air Sputum: no sputum noted  Cough much improved today during session with more forceful coughing and throat clears.  Auscultation in short sit (anterior fields) Right decreased breath sounds on inspiration Left normal, (posterior fields) L upper/lower lobe: normal breath sounds; R upper lobe: normal breath sounds R middle lobe: slightly decreased breath sounds on inspiration, R lower lobe: slightly decreased breath sounds during inspiration.  INTERVENTION: diaphragmatic breathing, modified postural drainage (side lying with quarter turn anterior) and percussion 3x24mnutes on R side only; manual vibration right sidelying single rep only with coughing initiated, with pt able to produce dime size amount of sputum, minimal thickness, white, no odor noted, then more sputum in sitting minimal to moderately thick, somewhat yellow, quarter in size.  POST INTERVENTION: seated at EOB Post intervention HR 74 O2 98% Sputum: min to moderate amount, moderate thickness, yellow, no odor  noted, see details above COUGH: spontaneous, somewhat weak  (better than day of chest PT eval), productive Auscultation in short sit: Marked improvement in all lobes with increased breath sounds, no rhonchi noted today.  ASSESSMENT: Patient responded well to percussion with rest breaks and re-education needed throughout the session. He demonstrated increased cough frequency and sputum production with interventions. He would benefit from continued chest PT including percussion and vibration in order to improve ventilation and perfusion, as well as secretion mobilization and patient comfort in order to improve functional abilities, however feel that amount and length can be decreased, will assess during one more session to determine if needing to continue.  PLAN OF CARE: Diaphragmatic breathing, postural drainage (positioning), spirometry, percussion, huffing, tai chi, active cycle breathing  He would benefit from further skilled chest physical therapy to maximize efficiency of breathing. PRECAUTIONS/CONTRAINDICATIONS TO CHEST PT: cardiac   Therapy Documentation Precautions:  Precautions Precautions: Fall Precaution Comments: R hemiplegia, R gaze preference Restrictions Weight Bearing Restrictions: No  Pain: No noted pain during session.    Locomotion : Ambulation Ambulation/Gait Assistance: 1: +2 Total assist   See FIM for current functional status  Therapy/Group: Individual Therapy  [name] 07/18/2014, 11:50 AM

## 2014-07-18 NOTE — Patient Care Conference (Signed)
Inpatient RehabilitationTeam Conference and Plan of Care Update Date: 07/18/2014   Time: 11;50 Am    Patient Name: [name]      Medical Record Number: [id number]  Date of Birth: [birth year] Sex: Male         Room/Bed: 4W18C/4W18C-01 Payor Info: Payor: MEDICARE / Plan: MEDICARE PART A AND B / Product Type: *No Product type* /    Admitting Diagnosis: CVA  Admit Date/Time:  07/02/2014  5:47 PM Admission Comments: No comment available   Primary Diagnosis:  Left pontine stroke Principal Problem: Left pontine stroke  Patient Active Problem List   Diagnosis Date Noted  . Right hemiparesis 07/05/2014  . Dysphagia, post-stroke 07/05/2014  . Dysarthria due to recent cerebral infarction 07/05/2014  . Left pontine stroke 07/02/2014  . Embolic stroke involving left carotid artery 06/28/2014  . Stroke 06/28/2014  . CAP (community acquired pneumonia) 06/28/2014  . Normocytic anemia 06/28/2014  . Essential hypertension 06/28/2014  . Hypokalemia 06/28/2014    Expected Discharge Date: Expected Discharge Date: 07/30/14  Team Members Present: Physician leading conference: Dr. Alysia Penna Social Worker Present: Ovidio Kin, LCSW Nurse Present: Elliot Cousin, RN PT Present: Raylene Everts, PT;Emily Parcell, PT OT Present: Benay Pillow, OT SLP Present: Windell Moulding, SLP PPS Coordinator present : Daiva Nakayama, RN, CRRN     Current Status/Progress Goal Weekly Team Focus  Medical   Increasing mobility,, good motivation, diarrhea resolved, nausea resolved, coughing overall better after chest PT  Maintaining medical stability  Increase by mouth intake   Bowel/Bladder   incontinent of bowel and bladder, toileting q 3 hrs and prn, position and hold and remove ,empty urinal, depends in use, total assist for hygiene with BM  continent of bowel and bladder with min assist, decrease episodes of incontinence with toileting      Swallow/Nutrition/ Hydration   Dys 3 textures and nectar thick  liquids   Min assist with least restrictive diet  tolerance of diet upgrade, continue trials of ice chips to continue working towards liquids advancement.    ADL's   Mod A - Max A squat pivot transfer, Mod A bathing (at sink or shower), Max A onto roll in shower chair, Min A grooming, set up with max verbal cues for UB dressing, 2 helpers for LB dressing for balance and clothing management  Min A overall  R NMR, functional transfers, pt/family education, positioning, safety awareness, attention to the R   Mobility   mod/max A bed mobility, mod A transfers to the R, mod/max to the L, mod/max A for sit<>stand, max A dynamic standing, +2 gait.  Note more activation in RLE, better postural control  Min A overall  R NMR, attention to the R, w/c mobility, bed mobility, transfers, gait, standing and sitting balance, postural control, pt/family education   Communication   Min-mod assist  min assist   continue to improve breath support and vocal intensity.    Safety/Cognition/ Behavioral Observations  Min assist   supervision   continue to improve initiation and endurance for participation in therapy.    Pain   c/o low back pain, kpad to area prn  3 or less on scale of 1-10      Skin   healing area to top of head, vasoline daily per wife had skin cancer lesion removed, redness to buttocks, MASD, alleyvn to area  no new skin breakdown while on rehab         *See Care Plan and progress notes for long  and short-term goals.  Barriers to Discharge: Wife limited and caregiver ability    Possible Resolutions to Barriers:  Hired caregiver versus SNF    Discharge Planning/Teaching Needs:  Plan home with hired caregivers, wife here daily and encourages pt in therapies.  Daughter's also involved      Team Discussion:  Has made good gains since last week-chest PT helping him. Timed tolieting q 2hours. IV fluids at night, checking po intake. Inattention to right still.  Wife here daily to encourage him.     Revisions to Treatment Plan:  Home versus NH for more intense therapies   Continued Need for Acute Rehabilitation Level of Care: The patient requires daily medical management by a physician with specialized training in physical medicine and rehabilitation for the following conditions: Daily direction of a multidisciplinary physical rehabilitation program to ensure safe treatment while eliciting the highest outcome that is of practical value to the patient.: Yes Daily medical management of patient stability for increased activity during participation in an intensive rehabilitation regime.: Yes Daily analysis of laboratory values and/or radiology reports with any subsequent need for medication adjustment of medical intervention for : Neurological problems  Justiss Gerbino, [name] 07/18/2014, 3:29 PM

## 2014-07-18 NOTE — Progress Notes (Signed)
Subjective/Complaints: No nausea but poor appetite  No further fever Afeb Review of Systems - Negative except weakness on right side Objective: Vital Signs: Blood pressure 147/59, pulse 65, temperature 97.8 F (36.6 C), temperature source Oral, resp. rate 17, height 5' 9"  (1.753 m), weight 61.1 kg (134 lb 11.2 oz), SpO2 97 %. No results found. Results for orders placed or performed during the hospital encounter of 07/02/14 (from the past 72 hour(s))  CBC with Differential     Status: Abnormal   Collection Time: 07/16/14  8:52 AM  Result Value Ref Range   WBC 12.2 (H) 4.0 - 10.5 K/uL   RBC 4.07 (L) 4.22 - 5.81 MIL/uL   Hemoglobin 11.4 (L) 13.0 - 17.0 g/dL   HCT 35.5 (L) 39.0 - 52.0 %   MCV 87.2 78.0 - 100.0 fL   MCH 28.0 26.0 - 34.0 pg   MCHC 32.1 30.0 - 36.0 g/dL   RDW 13.2 11.5 - 15.5 %   Platelets 387 150 - 400 K/uL   Neutrophils Relative % 80 (H) 43 - 77 %   Neutro Abs 9.7 (H) 1.7 - 7.7 K/uL   Lymphocytes Relative 10 (L) 12 - 46 %   Lymphs Abs 1.2 0.7 - 4.0 K/uL   Monocytes Relative 9 3 - 12 %   Monocytes Absolute 1.1 (H) 0.1 - 1.0 K/uL   Eosinophils Relative 1 0 - 5 %   Eosinophils Absolute 0.1 0.0 - 0.7 K/uL   Basophils Relative 0 0 - 1 %   Basophils Absolute 0.1 0.0 - 0.1 K/uL     HEENT: normal, no evidence of thrush Cardio: RRR Resp: CTA B/L, no resp distress GI: BS positive and NT,ND Extremity:  Edema Right hand and Right foot Skin:   Wound vertex of scalp, eschar Neuro: Lethargic, Cranial Nerve Abnormalities Right central 7, Abnormal Sensory reduced on left side to LT, Abnormal Motor 0/5 on right side except trace Right foot plantar flexion and Tone:  Hypotonia Musc/Skel:  Other no pain with Right shoulder ROM Gen NAD   Assessment/Plan: 1. Functional deficits secondary to left paramedian pontine infarct which require 3+ hours per day of interdisciplinary therapy in a comprehensive inpatient rehab setting. Physiatrist is providing close team supervision  and 24 hour management of active medical problems listed below. Physiatrist and rehab team continue to assess barriers to discharge/monitor patient progress toward functional and medical goals. Team conference today please see physician documentation under team conference tab, met with team face-to-face to discuss problems,progress, and goals. Formulized individual treatment plan based on medical history, underlying problem and comorbidities. Marland KitchenFIM: FIM - Bathing Bathing Steps Patient Completed: Chest, Right Arm, Abdomen, Front perineal area, Right upper leg, Left upper leg Bathing: 3: Mod-Patient completes 5-7 26f10 parts or 50-74%  FIM - Upper Body Dressing/Undressing Upper body dressing/undressing steps patient completed: Thread/unthread right sleeve of pullover shirt/dresss, Thread/unthread left sleeve of pullover shirt/dress Upper body dressing/undressing: 3: Mod-Patient completed 50-74% of tasks FIM - Lower Body Dressing/Undressing Lower body dressing/undressing steps patient completed: Thread/unthread left pants leg Lower body dressing/undressing: 1: Total-Patient completed less than 25% of tasks  FIM - Toileting Toileting: 1: Total-Patient completed zero steps, helper did all 3  FIM - TRadio producerDevices: Bedside commode Toilet Transfers: 1-From toilet/BSC: Total A (helper does all/Pt. < 25%)  FIM - Bed/Chair Transfer Bed/Chair Transfer Assistive Devices: Arm rests Bed/Chair Transfer: 3: Bed > Chair or W/C: Mod A (lift or lower assist)  FIM - Locomotion:  Wheelchair Distance: 25 Locomotion: Wheelchair: 1: Travels less than 50 ft with moderate assistance (Pt: 50 - 74%) FIM - Locomotion: Ambulation Locomotion: Ambulation Assistive Devices: Other (comment) (three muskateer style) Ambulation/Gait Assistance: 1: +2 Total assist Locomotion: Ambulation: 1: Two helpers  Comprehension Comprehension Mode: Auditory Comprehension: 5-Follows basic  conversation/direction: With extra time/assistive device  Expression Expression Mode: Verbal Expression: 4-Expresses basic 75 - 89% of the time/requires cueing 10 - 24% of the time. Needs helper to occlude trach/needs to repeat words.  Social Interaction Social Interaction: 4-Interacts appropriately 75 - 89% of the time - Needs redirection for appropriate language or to initiate interaction.  Problem Solving Problem Solving: 4-Solves basic 75 - 89% of the time/requires cueing 10 - 24% of the time  Memory Memory: 4-Recognizes or recalls 75 - 89% of the time/requires cueing 10 - 24% of the time   Medical Problem List and Plan: 1. Functional deficits secondary to left paramedian pontine infarct 2. DVT Prophylaxis/Anticoagulation: Pharmaceutical: Lovenox 3. Pain Management: N/A 4. Mood: LCSW to follow for evaluation and support.  5. Neuropsych: This patient is not capable of making decisions on his own behalf. 6. Skin/Wound Care:hx melanoma removal scalp, resume vaseline,healing very well eschar is off dermatologist Routine pressure relief measures. Turn patient every 2 hours when in bed.  7. Fluids/Electrolytes/Nutrition: Monitor I/O. Check daily weights to monitor for signs of overload.  8. CAP: Recurrent was off abx for several days with normalization of WBCs, WBC stable at 12, improved from 17K                                                                   9. Acute renal insufficiency: Improved. Off HCTZ at this time. 10. HTN: Will monitor every 8 hours and allow permissive HTN for adequate perfusion. 11.  Leucocytosis:improved on last CBC will recheck ,afeb , WBC stable d/c abx 12. Thrush: resolved  LOS (Days) 16 A FACE TO FACE EVALUATION WAS PERFORMED  KIRSTEINS,ANDREW E 07/18/2014, 9:17 AM

## 2014-07-18 NOTE — Progress Notes (Signed)
Occupational Therapy Session Note  Patient Details  Name: [name] MRN: [id number] Date of Birth: 08-Jul-1926  Today's Date: 07/18/2014 OT Individual Time: 0900-1000 and co treatment with PT 1300-1330 ( total time 1300-1400) OT Individual Time Calculation (min): 60 min and 30 min   Short Term Goals: Week 2:  OT Short Term Goal 1 (Week 2): Pt will peform UB dressing with Mod A in order to increase I in self care. OT Short Term Goal 2 (Week 2): Pt will perform shower transfer with Max A of 1 in order to increase i in self care. OT Short Term Goal 3 (Week 2): Pt will perform grooming with min A in order to increase I in self care. OT Short Term Goal 4 (Week 2): Pt will perform LB dressing with Max A of 1 in order to increase i in self care.   Skilled Therapeutic Interventions/Progress Updates:  Session 1:Upon entering the room, pt seated in wheelchair with wife present in room. Pt with no c/o pain during session. Pt upset that daughter forgot breakfast and food arriving as therapist entering the room. Pt seated on edge of chair working on posture with head up while eating. Pt requiring max verbal cues for small bites, taking sips, and using finger to swipe inside of mouth for pocketing. Pt requesting to brush teeth as he felt food still in cheek. Upon brushing teeth an entire piece of french toast coming from mouth. Pt requesting to wash up at sink side this session with wife leaving room secondary to distraction. Pt able to donn and doff pull over shirt with set up A and verbal cues for technique this session. Pt standing at sink side with Max A and assistance to block R knee while 2nd helper washing buttocks and assisting with clothing management. Pt seated in wheelchair and transitioning easily to PT session upon exiting the room.    Session 2: Upon entering room, pt seated in wheelchair with wife present during session. Skilled co -tx with PT with focus on continued stretching in cervical,  thoracic, and lumbar spine, with PT stretching hip flexors and hamstrings. Pt's toleration of stretching continues to improve as pt able to tolerate ~ 20 minutes. Therapist educated and demonstrated PROM for digit flex/ext, wrist flex/ext, sup/pro,elbow flex/ext, shoulder abd/add, and shoulder flex/ext with caregiver returning demonstration and writing details down. OT to continue to address.Pt engaging in tall kneeling while weight bearing through R side and reaching for pegs to the L for weight shifting. Pt very fatigued at this point but agreeing to attempt gait with therapist. Pt ambulating only ~ 20' with increased pushing and assist of 2. Pt assisted back to room via wheelchair total assist and transferred back to bed with Max A. Pt positioned in bed with hemiplegic side protected. Caregiver remains in room with call bell within reach and bed alarm on upon exiting the room.     Therapy Documentation Precautions:  Precautions Precautions: Fall Precaution Comments: R hemiplegia, R gaze preference Restrictions Weight Bearing Restrictions: No  See FIM for current functional status  Therapy/Group: Individual Therapy and Co-Treatment  [name] 07/18/2014, 11:32 AM

## 2014-07-18 NOTE — Progress Notes (Signed)
Social Work Elease Hashimoto, LCSW Social Worker Signed  Patient Care Conference 07/18/2014  1:35 PM    Expand All Collapse All   Inpatient RehabilitationTeam Conference and Plan of Care Update Date: 07/18/2014   Time: 11;50 Am     Patient Name: [name]       Medical Record Number: [id number]  Date of Birth: [birth year] Sex: Male         Room/Bed: 4W18C/4W18C-01 Payor Info: Payor: MEDICARE / Plan: MEDICARE PART A AND B / Product Type: *No Product type* /    Admitting Diagnosis: CVA  Admit Date/Time:  07/02/2014  5:47 PM Admission Comments: No comment available   Primary Diagnosis:  Left pontine stroke Principal Problem: Left pontine stroke    Patient Active Problem List     Diagnosis  Date Noted   .  Right hemiparesis  07/05/2014   .  Dysphagia, post-stroke  07/05/2014   .  Dysarthria due to recent cerebral infarction  07/05/2014   .  Left pontine stroke  07/02/2014   .  Embolic stroke involving left carotid artery  06/28/2014   .  Stroke  06/28/2014   .  CAP (community acquired pneumonia)  06/28/2014   .  Normocytic anemia  06/28/2014   .  Essential hypertension  06/28/2014   .  Hypokalemia  06/28/2014     Expected Discharge Date: Expected Discharge Date: 07/30/14  Team Members Present: Physician leading conference: Dr. Alysia Penna Social Worker Present: Ovidio Kin, LCSW Nurse Present: Elliot Cousin, RN PT Present: Raylene Everts, PT;Emily Parcell, PT OT Present: Benay Pillow, OT SLP Present: Windell Moulding, SLP PPS Coordinator present : Daiva Nakayama, RN, CRRN        Current Status/Progress  Goal  Weekly Team Focus   Medical     Increasing mobility,, good motivation, diarrhea resolved, nausea resolved, coughing overall better after chest PT  Maintaining medical stability  Increase by mouth intake   Bowel/Bladder     incontinent of bowel and bladder, toileting q 3 hrs and prn, position and hold and remove ,empty urinal, depends in use, total assist for hygiene  with BM  continent of bowel and bladder with min assist, decrease episodes of incontinence with toileting      Swallow/Nutrition/ Hydration     Dys 3 textures and nectar thick liquids   Min assist with least restrictive diet   tolerance of diet upgrade, continue trials of ice chips to continue working towards liquids advancement.    ADL's     Mod A - Max A squat pivot transfer, Mod A bathing (at sink or shower), Max A onto roll in shower chair, Min A grooming, set up with max verbal cues for UB dressing, 2 helpers for LB dressing for balance and clothing management  Min A overall  R NMR, functional transfers, pt/family education, positioning, safety awareness, attention to the R   Mobility     mod/max A bed mobility, mod A transfers to the R, mod/max to the L, mod/max A for sit<>stand, max A dynamic standing, +2 gait.  Note more activation in RLE, better postural control   Min A overall  R NMR, attention to the R, w/c mobility, bed mobility, transfers, gait, standing and sitting balance, postural control, pt/family education    Communication     Min-mod assist  min assist   continue to improve breath support and vocal intensity.    Safety/Cognition/ Behavioral Observations    Min assist   supervision  continue to improve initiation and endurance for participation in therapy.    Pain     c/o low back pain, kpad to area prn   3 or less on scale of 1-10      Skin     healing area to top of head, vasoline daily per wife had skin cancer lesion removed, redness to buttocks, MASD, alleyvn to area   no new skin breakdown while on rehab          *See Care Plan and progress notes for long and short-term goals.    Barriers to Discharge:  Wife limited and caregiver ability      Possible Resolutions to Barriers:   Hired caregiver versus SNF     Discharge Planning/Teaching Needs:   Plan home with hired caregivers, wife here daily and encourages pt in therapies.  Daughter's also involved        Team Discussion:    Has made good gains since last week-chest PT helping him. Timed tolieting q 2hours. IV fluids at night, checking po intake. Inattention to right still.  Wife here daily to encourage him.     Revisions to Treatment Plan:    Home versus NH for more intense therapies    Continued Need for Acute Rehabilitation Level of Care: The patient requires daily medical management by a physician with specialized training in physical medicine and rehabilitation for the following conditions: Daily direction of a multidisciplinary physical rehabilitation program to ensure safe treatment while eliciting the highest outcome that is of practical value to the patient.: Yes Daily medical management of patient stability for increased activity during participation in an intensive rehabilitation regime.: Yes Daily analysis of laboratory values and/or radiology reports with any subsequent need for medication adjustment of medical intervention for : Neurological problems  Elease Hashimoto 07/18/2014, 3:29 PM                 Elease Hashimoto, Dasher Worker Signed  Patient Care Conference 07/11/2014  2:50 PM    Expand All Collapse All   Inpatient RehabilitationTeam Conference and Plan of Care Update Date: 07/11/2014   Time: 12;00 Pm     Patient Name: [name]       Medical Record Number: [id number]  Date of Birth: 10-14-1925 Sex: Male         Room/Bed: 4W18C/4W18C-01 Payor Info: Payor: MEDICARE / Plan: MEDICARE PART A AND B / Product Type: *No Product type* /    Admitting Diagnosis: CVA  Admit Date/Time:  07/02/2014  5:47 PM Admission Comments: No comment available   Primary Diagnosis:  Left pontine stroke Principal Problem: Left pontine stroke    Patient Active Problem List     Diagnosis  Date Noted   .  Right hemiparesis  07/05/2014   .  Dysphagia, post-stroke  07/05/2014   .  Dysarthria due to recent cerebral infarction  07/05/2014   .  Left pontine stroke  07/02/2014    .  Embolic stroke involving left carotid artery  06/28/2014   .  Stroke  06/28/2014   .  CAP (community acquired pneumonia)  06/28/2014   .  Normocytic anemia  06/28/2014   .  Essential hypertension  06/28/2014   .  Hypokalemia  06/28/2014     Expected Discharge Date: Expected Discharge Date: 07/30/14  Team Members Present: Physician leading conference: Dr. Alysia Penna Social Worker Present: Ovidio Kin, LCSW Nurse Present: Heather Roberts, RN PT Present: Carney Living, PT;Emily Parcell, Aram Candela  [place], PT OT Present: Clyda Greener, Rhetta Mura, OT SLP Present: Windell Moulding, SLP PPS Coordinator present : Daiva Nakayama, RN, CRRN        Current Status/Progress  Goal  Weekly Team Focus   Medical     Some RLE extension, PNA resolved, Diarrhea resolved   medical stability  Cont rehab   Bowel/Bladder     iNCONTINENT TO BOWEL AND BLADDER.Last BM 12/14   To monitor bladder and bladder function Q shift.   Timed toilitting Q 2 hrs.   Swallow/Nutrition/ Hydration     Dys. 2 textures and nectar-thick liquids with Min assist    least restrictive PO intake with Min assist   trials of ice chips   ADL's     Max assist squat pivot transfer, +2 for clothing management and bathing at sit > stand level, mod-total assist for self-care tasks   Min A overall  Rt attention, transfers, Pt and family education, positioning of RUE and use as stabilizer   Mobility     Max A bed mobiltiy, max A for squat pivot transfers, +2 for dynamic standing, +2 for gait  Min A overall  R NMR, attention to the R, w/c mobility, bed mobility, transfers, gait, standing and sitting balance, postural control, pt/family education.    Communication     Mod assist   Min assist   continue to increase breath support and vocal intensity    Safety/Cognition/ Behavioral Observations    Min-Mod assist   Supervision   increase initiation and endurance   Pain     Denies pain  Pain level less than 2,on scale 1 to 10   To monitor  pain levels Q 2 hrs.   Skin     Scab to scalp dry and intact.  No new skin breakdown while in rehab   Assess skin condition Q shift      *See Care Plan and progress notes for long and short-term goals.    Barriers to Discharge:  wife limited in caregiving ability      Possible Resolutions to Barriers:   ?hired caregiver     Discharge Planning/Teaching Needs:   Family working on hiring caregivers for discharge home, want him home.  Wife here daily and participates in care        Team Discussion:    Getting return in R-UE.  Issues with nausea from antibiotics-dc. Pneumonia cleared. Po intake poor not feeling good. Hips and pelvis tight limit movement. Making progress slowly.   Revisions to Treatment Plan:    None    Continued Need for Acute Rehabilitation Level of Care: The patient requires daily medical management by a physician with specialized training in physical medicine and rehabilitation for the following conditions: Daily direction of a multidisciplinary physical rehabilitation program to ensure safe treatment while eliciting the highest outcome that is of practical value to the patient.: Yes Daily medical management of patient stability for increased activity during participation in an intensive rehabilitation regime.: Yes Daily analysis of laboratory values and/or radiology reports with any subsequent need for medication adjustment of medical intervention for : Neurological problems;Other  Elease Hashimoto 07/12/2014, 11:54 AM                 Elease Hashimoto, LCSW Social Worker Signed  Patient Care Conference 07/04/2014  2:53 PM    Expand All Collapse All   Inpatient RehabilitationTeam Conference and Plan of Care Update Date: 07/04/2014   Time: 11;45 AM  Patient Name: [name]       Medical Record Number: [id number]  Date of Birth: September 13, 1925 Sex: Male         Room/Bed: 4W18C/4W18C-01 Payor Info: Payor: MEDICARE / Plan: MEDICARE PART A AND B / Product  Type: *No Product type* /    Admitting Diagnosis: CVA  Admit Date/Time:  07/02/2014  5:47 PM Admission Comments: No comment available   Primary Diagnosis:  Left pontine stroke Principal Problem: Left pontine stroke    Patient Active Problem List     Diagnosis  Date Noted   .  Right hemiparesis  07/05/2014   .  Dysphagia, post-stroke  07/05/2014   .  Dysarthria due to recent cerebral infarction  07/05/2014   .  Left pontine stroke  07/02/2014   .  Embolic stroke involving left carotid artery  06/28/2014   .  Stroke  06/28/2014   .  CAP (community acquired pneumonia)  06/28/2014   .  Normocytic anemia  06/28/2014   .  Essential hypertension  06/28/2014   .  Hypokalemia  06/28/2014     Expected Discharge Date: Expected Discharge Date: 07/30/14  Team Members Present: Physician leading conference: Dr. Alysia Penna Nurse Present: Rayetta Pigg, RN PT Present: Carney Living, Jimmie Molly, PT OT Present: Clyda Greener, Rhetta Mura, OT SLP Present: Gunnar Fusi, SLP PPS Coordinator present : Daiva Nakayama, RN, CRRN        Current Status/Progress  Goal  Weekly Team Focus   Medical     right flaccid hemiparesis, PNA resolving, diarrhea from abx  maintain med stability  initiate rehab program   Bowel/Bladder     Incontinent of bowel and bladder. LBM 07/03/14 x3  Mod assist   Timed toileting q2-3 hours   Swallow/Nutrition/ Hydration     Dys. 1 textures and nectar-thiclks liquids with Max assist    least restrictive PO intake with Min assist   increase endurance and PO intake    ADL's     Max A for bed mobility, +2 for STS for clothing management, max A squat pivot transfers, Mod  - total A for self care tasks   Min A overall  Pt education, attention to R, functional transfers/mobility, pt and family education, safety awareness, use of R UE as stabilitzer.    Mobility     max A for bed mobility, max A squat pivot transfers, +2 for standing, gait and stairs not performed  yet due to safety.    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    Barriers to Discharge:  elderly wife who uses cane     Possible Resolutions to Barriers:   ? if daughter can assist  post d/c     Discharge Planning/Teaching Needs:   Based upon goals and pt's progress-wife can only provide supervision level, discussing hired assist and/or NHP        Team Discussion:    Goals-min level-inattention to right and flaccid.  Wants to go home and will hire assistance.  Concerned about his hiccups-MD addressing. Pleased with progress since yesterday.   Revisions to Treatment Plan:    None    Continued Need for Acute Rehabilitation Level of Care: The patient requires daily medical management by a physician with specialized training in physical medicine and rehabilitation for the following conditions: Daily direction of a multidisciplinary physical rehabilitation program to ensure safe treatment while eliciting the highest outcome that is of practical value to the patient.: Yes Daily medical management of patient stability for increased activity during participation in an intensive rehabilitation regime.: Yes Daily analysis of laboratory values and/or radiology reports with any subsequent need for medication adjustment of medical intervention for :  Neurological problems  Elease Hashimoto 07/05/2014, 8:38 AM                  Patient ID: Lorrene Reid, male   DOB: November 01, 1925, 80 y.o.   MRN: [id number]

## 2014-07-18 NOTE — Progress Notes (Signed)
Physical Therapy Session Note  Patient Details  Name: [name] MRN: [id number] Date of Birth: [birth year]  Today's Date: 07/18/2014 PT Co-Treatment Time: 1330 (1300-1400 whole session w/ OT)-1403 PT Co-Treatment Time Calculation (min): 33 min  Short Term Goals: Week 3:  PT Short Term Goal 1 (Week 3): Pt to perform bed mobility with mod Ax1person PT Short Term Goal 2 (Week 3): Pt to perform bed<>w/c transfer with min Ax1person PT Short Term Goal 3 (Week 3): Pt to maintain dynamic sitting balance with min Ax1person PT Short Term Goal 4 (Week 3): Pt to propel w/c 57' with mod Ax1person PT Short Term Goal 5 (Week 3): Pt to ambulate 25' with LRAD and Ax2persons  Skilled Therapeutic Interventions/Progress Updates:   Pt received sitting in w/c in room, agreeable to therapy session.  Skilled co-treat with OT in order to continue to address stretching of cervical, thoracic and lumbar spine, hip flexors and glutes/hamstrings.  Pt tolerated well x 20 mins of session.  OT providing education to wife on PROM to RUE, see her note for details.  Then worked on tall kneeling activities for [employer] through RLE/UE, see details below.  Ended session with attempted gait.  Pt extremely fatigued and only able to tolerate approx 20' with increased assist noted from morning session with increased pushing.  Assisted pt back to w/c and back to room.  Transferred to supine via max A.  Left in bed with bed alarm set and heat pack in place.  All needs in reach and wife in room.   Therapy Documentation Precautions:  Precautions Precautions: Fall Precaution Comments: R hemiplegia, R gaze preference Restrictions Weight Bearing Restrictions: No  Pain:Pt with pain in R lower back, provided manual trigger point massage, and heat to back once back in bed.    Locomotion : Ambulation Ambulation/Gait Assistance: 1: +2 Total assist    Other Treatments: Treatments Therapeutic Activity: Performed tall kneeling activity with  UE support on kay bench while reaching upwards and to the L in order to facilitate increased hip extension, upright head and chest posture, and increased WB through RLE and increased weight shift to the L to decrease pusher tencencies.  Requires strong facilitation for glute stretch and activation during session, as well as facilitation at trunk for upright posture.   Neuromuscular Facilitation: Right;Left;Forced use;Activity to increase motor control;Activity to increase anterior-posterior weight shifting;Activity to increase lateral weight shifting Weight Bearing Technique Weight Bearing Technique: Yes RUE Weight Bearing Technique: High kneeling Response to Weight Bearing Technique: fatigued, but tolerated well.    See FIM for current functional status  Therapy/Group: Co-Treatment  Darnell Jeschke, Betha Loa 07/18/2014, 2:08 PM

## 2014-07-19 ENCOUNTER — Inpatient Hospital Stay (HOSPITAL_COMMUNITY): Payer: Medicare Other | Admitting: Rehabilitation

## 2014-07-19 ENCOUNTER — Inpatient Hospital Stay (HOSPITAL_COMMUNITY): Payer: Medicare Other | Admitting: Occupational Therapy

## 2014-07-19 ENCOUNTER — Inpatient Hospital Stay (HOSPITAL_COMMUNITY): Payer: Medicare Other | Admitting: Speech Pathology

## 2014-07-19 MED ORDER — MEGESTROL ACETATE 400 MG/10ML PO SUSP
200.0000 mg | Freq: Two times a day (BID) | ORAL | Status: DC
  Administered 2014-07-19 – 2014-07-25 (×13): 200 mg via ORAL
  Filled 2014-07-19 (×16): qty 5

## 2014-07-19 NOTE — Progress Notes (Signed)
Occupational Therapy Session Note  Patient Details  Name: [name] MRN: [id number] Date of Birth: 17-Sep-1925  Today's Date: 07/19/2014 OT Co-Treatment Time: 1030-1105 ( Co -tx with PT from 1000-1105 a total of 65 minutes) OT Co-Treatment Time Calculation (min): 35 min   Short Term Goals: Week 3:  OT Short Term Goal 1 (Week 3): Patient's caregiver will demonstrate proper technique of PROM to R UE for stretching.  OT Short Term Goal 2 (Week 3): Pt will perform LB bathing and dressing with Max A of 1 in order to decrease level of assistance for self care.  OT Short Term Goal 3 (Week 3): Pt will perform toilet transfer with Mod A in order to decrease assistance in functional transfers. OT Short Term Goal 4 (Week 3): Pt will perform shower transfer with Mod A in order to decrease assistance in functional transfers.  Skilled Therapeutic Interventions/Progress Updates:  Upon entering room, pt seated in wheelchair with wife present during session. Skilled co -tx with PT for with focus on continued stretching in cervical, thoracic, and lumbar spine, with PT stretching hip flexors and hamstrings with pt tolerating well. Pt engaging in 2 bouts of ambulation this session of 109' and 109' back. First bout of ambulation with "three muskateer" style and second bout utilizing hemiwalker. PT demonstrating proper technique for ambulation with hemi walker. Pt requiring assist +2 with advancement of walker, weight shifting, and advancement of R foot. Stretching mentioned prior performed at end of session after bouts of ambulation performed. PROM for R shoulder flexion and abduction x 5 reps. Scapular mobilization performed in all planes x 10 reps each. Caregiver provided with paper handout of UE PROM exercises discussed and demonstrated yesterday. OT assisted pt back to room with total A in wheelchair. QRB and arm tray donned for safety with wife remaining in room and call bell within reach upon exiting the room.      Therapy Documentation Precautions:  Precautions Precautions: Fall Precaution Comments: R hemiplegia, R gaze preference Restrictions Weight Bearing Restrictions: No  See FIM for current functional status  Therapy/Group: Co-Treatment  Pittman, Haide Klinker L 07/19/2014, 11:22 AM

## 2014-07-19 NOTE — Progress Notes (Signed)
Subjective/Complaints: No issues overnite Afeb Review of Systems - Negative except weakness on right side Objective: Vital Signs: Blood pressure 122/52, pulse 70, temperature 98 F (36.7 C), temperature source Oral, resp. rate 18, height 5\' 9"  (1.753 m), weight 61.5 kg (135 lb 9.3 oz), SpO2 97 %. No results found. Results for orders placed or performed during the hospital encounter of 07/02/14 (from the past 72 hour(s))  CBC with Differential     Status: Abnormal   Collection Time: 07/16/14  8:52 AM  Result Value Ref Range   WBC 12.2 (H) 4.0 - 10.5 K/uL   RBC 4.07 (L) 4.22 - 5.81 MIL/uL   Hemoglobin 11.4 (L) 13.0 - 17.0 g/dL   HCT 35.5 (L) 39.0 - 52.0 %   MCV 87.2 78.0 - 100.0 fL   MCH 28.0 26.0 - 34.0 pg   MCHC 32.1 30.0 - 36.0 g/dL   RDW 13.2 11.5 - 15.5 %   Platelets 387 150 - 400 K/uL   Neutrophils Relative % 80 (H) 43 - 77 %   Neutro Abs 9.7 (H) 1.7 - 7.7 K/uL   Lymphocytes Relative 10 (L) 12 - 46 %   Lymphs Abs 1.2 0.7 - 4.0 K/uL   Monocytes Relative 9 3 - 12 %   Monocytes Absolute 1.1 (H) 0.1 - 1.0 K/uL   Eosinophils Relative 1 0 - 5 %   Eosinophils Absolute 0.1 0.0 - 0.7 K/uL   Basophils Relative 0 0 - 1 %   Basophils Absolute 0.1 0.0 - 0.1 K/uL     HEENT: normal, no evidence of thrush Cardio: RRR Resp: CTA B/L, no resp distress GI: BS positive and NT,ND Extremity:  Edema Right hand and Right foot Skin:   Wound vertex of scalp, eschar Neuro: Lethargic, Cranial Nerve Abnormalities Right central 7, Abnormal Sensory reduced on left side to LT, Abnormal Motor 0/5 on right UE except trace at finger and wrist flexors  2-Right foot plantar flexion and Tone:  Hypotonia Left side 5/5 in UE and LE Musc/Skel:  Other no pain with Right shoulder ROM Gen NAD   Assessment/Plan: 1. Functional deficits secondary to left paramedian pontine infarct which require 3+ hours per day of interdisciplinary therapy in a comprehensive inpatient rehab setting. Physiatrist is providing  close team supervision and 24 hour management of active medical problems listed below. Physiatrist and rehab team continue to assess barriers to discharge/monitor patient progress toward functional and medical goals.  Marland KitchenFIM: FIM - Bathing Bathing Steps Patient Completed: Chest, Right Arm, Abdomen, Front perineal area, Right upper leg, Left upper leg Bathing: 3: Mod-Patient completes 5-7 49f 10 parts or 50-74%  FIM - Upper Body Dressing/Undressing Upper body dressing/undressing steps patient completed: Thread/unthread right sleeve of pullover shirt/dresss, Thread/unthread left sleeve of pullover shirt/dress, Put head through opening of pull over shirt/dress, Pull shirt over trunk Upper body dressing/undressing: 5: Set-up assist to: Obtain clothing/put away FIM - Lower Body Dressing/Undressing Lower body dressing/undressing steps patient completed: Thread/unthread left pants leg Lower body dressing/undressing: 1: Two helpers  FIM - Toileting Toileting: 1: Total-Patient completed zero steps, helper did all 3  FIM - Radio producer Devices: Bedside commode Toilet Transfers: 1-From toilet/BSC: Total A (helper does all/Pt. < 25%)  FIM - Bed/Chair Transfer Bed/Chair Transfer Assistive Devices: Arm rests Bed/Chair Transfer: 3: Supine > Sit: Mod A (lifting assist/Pt. 50-74%/lift 2 legs, 2: Sit > Supine: Max A (lifting assist/Pt. 25-49%), 3: Chair or W/C > Bed: Mod A (lift or lower assist),  3: Bed > Chair or W/C: Mod A (lift or lower assist)  FIM - Locomotion: Wheelchair Distance: 25 Locomotion: Wheelchair: 1: Travels less than 50 ft with moderate assistance (Pt: 50 - 74%) FIM - Locomotion: Ambulation Locomotion: Ambulation Assistive Devices: Other (comment), Orthosis (L HHA and under R axilla) Ambulation/Gait Assistance: 1: +2 Total assist Locomotion: Ambulation: 1: Two helpers  Comprehension Comprehension Mode: Auditory Comprehension: 5-Follows basic  conversation/direction: With extra time/assistive device  Expression Expression Mode: Verbal Expression: 4-Expresses basic 75 - 89% of the time/requires cueing 10 - 24% of the time. Needs helper to occlude trach/needs to repeat words.  Social Interaction Social Interaction: 5-Interacts appropriately 90% of the time - Needs monitoring or encouragement for participation or interaction.  Problem Solving Problem Solving: 5-Solves complex 90% of the time/cues < 10% of the time  Memory Memory: 4-Recognizes or recalls 75 - 89% of the time/requires cueing 10 - 24% of the time   Medical Problem List and Plan: 1. Functional deficits secondary to left paramedian pontine infarct 2. DVT Prophylaxis/Anticoagulation: Pharmaceutical: Lovenox 3. Pain Management: N/A 4. Mood: LCSW to follow for evaluation and support.  5. Neuropsych: This patient is not capable of making decisions on his own behalf. 6. Skin/Wound Care:hx melanoma removal scalp, resume vaseline,healing very well eschar is off dermatologist Routine pressure relief measures. Turn patient every 2 hours when in bed.  7. Fluids/Electrolytes/Nutrition: Monitor I/O. Check daily weights to monitor for signs of overload.                                                              8. Acute renal insufficiency: Improved. Off HCTZ at this time. 10. HTN: Will monitor every 8 hours and allow permissive HTN for adequate perfusion. 11.  Leucocytosis:improved on last CBC will recheck    LOS (Days) 17 A FACE TO FACE EVALUATION WAS PERFORMED  Berton Butrick E 07/19/2014, 7:18 AM

## 2014-07-19 NOTE — Progress Notes (Signed)
Physical Therapy Session Note  Patient Details  Name: [name] MRN: [id number] Date of Birth: 03/01/1926  Today's Date: 07/19/2014 PT Co-Treatment Time: 1000 (whole session 1000-1105 w/ OT)-1030 PT Co-Treatment Time Calculation (min): 30 min  Short Term Goals: Week 3:  PT Short Term Goal 1 (Week 3): Pt to perform bed mobility with mod Ax1person PT Short Term Goal 2 (Week 3): Pt to perform bed<>w/c transfer with min Ax1person PT Short Term Goal 3 (Week 3): Pt to maintain dynamic sitting balance with min Ax1person PT Short Term Goal 4 (Week 3): Pt to propel w/c 49' with mod Ax1person PT Short Term Goal 5 (Week 3): Pt to ambulate 25' with LRAD and Ax2persons  Skilled Therapeutic Interventions/Progress Updates:   Pt received sitting in w/c in room, agreeable to therapy session.  Received meds from RN prior to leaving room and assisted with S of consuming a few sips of nectar thick orange juice with min cues for smaller sips.  Assisted pt to/from therapy gym in w/c.  Skilled co-treat with OT in order to work on gait with and without hemi walker for upright posture, increased WB through RLE, anterior pelvic tilt, increased weight shift to the L and clearing RLE during swing phase of gait.  Performed 109' x 2 reps initially without use of hemi walker with OT assisting at LUE to keep close to body to prevent pushing and increase work on LEs and PT providing assist for clearing RLE at times (although with proper weight shift he can clear RLE), upright posture and forward weight shift over RLE and preventing R knee hyperextension.  Remainder of session focused on transfers and stretching to spine and hips/glutes as mentioned in previous notes.  See OT note for further details on UE PROM.    Therapy Documentation Precautions:  Precautions Precautions: Fall Precaution Comments: R hemiplegia, R gaze preference Restrictions Weight Bearing Restrictions: No  Pain: Pain Assessment Pain Assessment:  0-10 Pain Score: 0-No pain   Locomotion : Ambulation Ambulation/Gait Assistance: 2: Max assist;1: +2 Total assist   See FIM for current functional status  Therapy/Group: Co-Treatment  Jasmeet Gehl, Betha Loa 07/19/2014, 12:46 PM

## 2014-07-19 NOTE — Progress Notes (Signed)
Speech Language Pathology Daily Session Note  Patient Details  Name: DANH BAYUS MRN: [id number] Date of Birth: 11-23-1925  Today's Date: 07/19/2014 SLP Individual Time: 1300-1410 SLP Individual Time Calculation (min): 70 min  Short Term Goals: Week 3: SLP Short Term Goal 1 (Week 3): Patient will consume Dys.3 textures and nectar-thick liquids via cup with Supervision verbal cues to utilize safe swallow strategies to minimize overt s/s of aspiration. SLP Short Term Goal 2 (Week 3): Patient will demonstrate readiness for repeat objective swallow assessment with improved managment of oral secreations with no more than 5 cues needed to manage secreations in a 30 minute session. SLP Short Term Goal 3 (Week 3): Patient will produce intelligible speech at the phrase level with Min multimodal cues to utilize compensatory strategies.  Skilled Therapeutic Interventions: Skilled treatment session focused on addressing dysphagia and dysarthria goals as well as education.  SLP facilitated session with set-up of oral care and water via cup with cough in 50% of trials.  SLP's cues for small portions was intermittently effective.  Patient and wife report that patient has been doing well while consuming Dys. 3 textures and nectar-thick liquids via cup during meals.  SLP also facilitated session with Min verbal cues to manage oral secretions and utilize speech intelligibility strategies during a structured reading task.  Patient and wife recalled and reported to intermittently be performing pharyngeal strengthening exercises.   Continue with current plan of care.   FIM:  Comprehension Comprehension Mode: Auditory Comprehension: 5-Follows basic conversation/direction: With extra time/assistive device Expression Expression Mode: Verbal Expression: 4-Expresses basic 75 - 89% of the time/requires cueing 10 - 24% of the time. Needs helper to occlude trach/needs to repeat words. Social Interaction Social  Interaction: 4-Interacts appropriately 75 - 89% of the time - Needs redirection for appropriate language or to initiate interaction. Problem Solving Problem Solving: 4-Solves basic 75 - 89% of the time/requires cueing 10 - 24% of the time Memory Memory: 4-Recognizes or recalls 75 - 89% of the time/requires cueing 10 - 24% of the time FIM - Eating Eating Activity: 5: Needs verbal cues/supervision  Pain Pain Assessment Pain Assessment: No/denies pain  Therapy/Group: Individual Therapy  Carmelia Roller., CCC-SLP [phone]  [place] 07/19/2014, 3:55 PM

## 2014-07-20 LAB — CREATININE, SERUM
Creatinine, Ser: 0.7 mg/dL (ref 0.50–1.35)
GFR calc Af Amer: >90 mL/min (ref >90)
GFR calc non Af Amer: 83 mL/min — ABNORMAL LOW (ref >90)

## 2014-07-20 NOTE — Progress Notes (Signed)
Subjective/Complaints: No issues overnite "how am I doing" Afeb Review of Systems - Negative except weakness on right side Objective: Vital Signs: Blood pressure 138/63, pulse 75, temperature 98.1 F (36.7 C), temperature source Oral, resp. rate 18, height 5' 9"  (1.753 m), weight 64.1 kg (141 lb 5 oz), SpO2 98 %. No results found. Results for orders placed or performed during the hospital encounter of 07/02/14 (from the past 72 hour(s))  Creatinine, serum     Status: Abnormal   Collection Time: 07/20/14  6:15 AM  Result Value Ref Range   Creatinine, Ser 0.70 0.50 - 1.35 mg/dL   GFR calc non Af Amer 83 (L) >90 mL/min   GFR calc Af Amer >90 >90 mL/min    Comment: (NOTE) The eGFR has been calculated using the CKD EPI equation. This calculation has not been validated in all clinical situations. eGFR's persistently <90 mL/min signify possible Chronic Kidney Disease.      HEENT: normal, no evidence of thrush Cardio: RRR Resp: CTA B/L, no resp distress GI: BS positive and NT,ND Extremity:  Edema Right hand and Right foot Skin:   Wound vertex of scalp, eschar Neuro: Lethargic, Cranial Nerve Abnormalities Right central 7, Abnormal Sensory reduced on left side to LT, Abnormal Motor 0/5 on right UE except trace at finger and wrist flexors  2-Right foot plantar flexion and Tone:  Hypotonia Left side 5/5 in UE and LE Musc/Skel:  Other no pain with Right shoulder ROM Gen NAD   Assessment/Plan: 1. Functional deficits secondary to left paramedian pontine infarct Right hemiparesis which require 3+ hours per day of interdisciplinary therapy in a comprehensive inpatient rehab setting. Physiatrist is providing close team supervision and 24 hour management of active medical problems listed below. Physiatrist and rehab team continue to assess barriers to discharge/monitor patient progress toward functional and medical goals.  Marland KitchenFIM: FIM - Bathing Bathing Steps Patient Completed: Chest, Right  Arm, Abdomen, Front perineal area, Right upper leg, Left upper leg Bathing: 3: Mod-Patient completes 5-7 [id number] parts or 50-74%  FIM - Upper Body Dressing/Undressing Upper body dressing/undressing steps patient completed: Thread/unthread right sleeve of pullover shirt/dresss, Thread/unthread left sleeve of pullover shirt/dress, Put head through opening of pull over shirt/dress, Pull shirt over trunk Upper body dressing/undressing: 5: Set-up assist to: Obtain clothing/put away FIM - Lower Body Dressing/Undressing Lower body dressing/undressing steps patient completed: Thread/unthread left pants leg Lower body dressing/undressing: 1: Two helpers  FIM - Toileting Toileting: 1: Total-Patient completed zero steps, helper did all 3  FIM - TRadio producerDevices: Bedside commode Toilet Transfers: 1-From toilet/BSC: Total A (helper does all/Pt. < 25%)  FIM - Bed/Chair Transfer Bed/Chair Transfer Assistive Devices: Arm rests Bed/Chair Transfer: 3: Bed > Chair or W/C: Mod A (lift or lower assist), 3: Chair or W/C > Bed: Mod A (lift or lower assist)  FIM - Locomotion: Wheelchair Distance: 25 Locomotion: Wheelchair: 0: Activity did not occur FIM - Locomotion: Ambulation Locomotion: Ambulation Assistive Devices: Orthosis, WMuseum/gallery curatorAmbulation/Gait Assistance: 2: Max assist, 1: +2 Total assist Locomotion: Ambulation: 1: Two helpers  Comprehension Comprehension Mode: Auditory Comprehension: 5-Follows basic conversation/direction: With extra time/assistive device  Expression Expression Mode: Verbal Expression: 4-Expresses basic 75 - 89% of the time/requires cueing 10 - 24% of the time. Needs helper to occlude trach/needs to repeat words.  Social Interaction Social Interaction: 5-Interacts appropriately 90% of the time - Needs monitoring or encouragement for participation or interaction.  Problem Solving Problem Solving: 5-Solves basic 90%  of the time/requires  cueing < 10% of the time  Memory Memory: 4-Recognizes or recalls 75 - 89% of the time/requires cueing 10 - 24% of the time   Medical Problem List and Plan: 1. Functional deficits secondary to left paramedian pontine infarct 2. DVT Prophylaxis/Anticoagulation: Pharmaceutical: Lovenox 3. Pain Management: N/A 4. Mood: LCSW to follow for evaluation and support.  5. Neuropsych: This patient is not capable of making decisions on his own behalf. 6. Skin/Wound Care:hx melanoma removal scalp, resume vaseline,healing very well eschar is off dermatologist Routine pressure relief measures. Turn patient every 2 hours when in bed.  7. Fluids/Electrolytes/Nutrition: Monitor I/O. Check daily weights to monitor for signs of overload.                                                              8. Acute renal insufficiency: Improved. Off HCTZ at this time. 10. HTN: Will monitor every 8 hours and allow permissive HTN for adequate perfusion. 11.  Leucocytosis:improved on last CBC will recheck    LOS (Days) 18 A FACE TO FACE EVALUATION WAS PERFORMED  Primo Innis E 07/20/2014, 9:14 AM

## 2014-07-21 ENCOUNTER — Inpatient Hospital Stay (HOSPITAL_COMMUNITY): Payer: Medicare Other | Admitting: Physical Therapy

## 2014-07-21 ENCOUNTER — Inpatient Hospital Stay (HOSPITAL_COMMUNITY): Payer: Medicare Other | Admitting: Speech Pathology

## 2014-07-21 ENCOUNTER — Inpatient Hospital Stay (HOSPITAL_COMMUNITY): Payer: Medicare Other | Admitting: *Deleted

## 2014-07-21 NOTE — Progress Notes (Signed)
Physical Therapy Session Note  Patient Details  Name: [name] MRN: [id number] Date of Birth: 12/24/1925  Today's Date: 07/21/2014 PT Individual Time: 1259-1359 PT Individual Time Calculation (min): 60 min   Short Term Goals: Week 2:  PT Short Term Goal 1 (Week 2): Pt to perform bed mobility with mod Ax1person PT Short Term Goal 1 - Progress (Week 2): Progressing toward goal PT Short Term Goal 2 (Week 2): Pt to perform bed<>w/c transfer with mod Ax1person PT Short Term Goal 2 - Progress (Week 2): Met PT Short Term Goal 3 (Week 2): Pt to maintain dynamic sitting balance with min Ax1person PT Short Term Goal 3 - Progress (Week 2): Progressing toward goal PT Short Term Goal 4 (Week 2): Pt to propel w/c 72' with mod Ax1person PT Short Term Goal 4 - Progress (Week 2): Progressing toward goal PT Short Term Goal 5 (Week 2): Pt to ambulate 25' with LRAD and Ax2persons PT Short Term Goal 5 - Progress (Week 2): Met  Skilled Therapeutic Interventions/Progress Updates:      Therapy Documentation Precautions:  Precautions Precautions: Fall Precaution Comments: R hemiplegia, R gaze preference Restrictions Weight Bearing Restrictions: No Pain: Pain Assessment Pain Assessment: No/denies pain Transfers:  Sit to and from stand transfer with moderate assist; verbal cues for foot placement, even weight distribution and controlled descent.   Ambulation: Patient ambulated 55 feet with left hemiwalker and right toe off brace and moderate assist.  Patient ambulated with a step to gait pattern. Patient with a forward flexed posture. Patient required mod assist for hip extension, advancement, placement, and stabilization of RLE.  Patient able to initiate advancement of RLE with increased lateral weight shift. Patient required verbal cues for proper sequence and technique.   Stairs: Patient up and down 2 steps with left handrail max assist. Patient performed stairs with a step to pattern  descended stairs backwards. Patient required max assist for hip extension, upright posture and advancement, placement, and stabilization of RLE.   Lungs clear to auscultate bilaterally without any abnormal breath sounds.  Patient requested to use toilet during session. Patient incontinent of bowel during session. Patient transfer stand pivot transfer from [facility] to commode max assist. Patient required total assist for lower body dressing and perineal hygiene performed in standing. Patient max assist for three trials in standing to perform hygiene and lower body dressing with total assist.    Patient left in room at end of session in wheelchair with Wheelchair seat belt engaged and call bell within reach. Spouse and daughter present at end of session.  Patient tolerated treatment well. Vitals monitored and remained stable throughout session responding appropriately to activity. Patient was without pain during session. Plan of care to address, safety, functional mobility, dynamic balance, endurance, and strengthening to ensure a safe discharge home and maximize independence.      Therapy/Group: Individual Therapy  Retta Diones 07/21/2014, 3:49 PM

## 2014-07-21 NOTE — Progress Notes (Signed)
Patient ID: [name], male   DOB: 27-Jan-1926, 80 y.o.   MRN: [id number]   07/21/14.  80 year old male admitted for CIR with functional deficits secondary to left paramedian pontine infarct  Subjective/Complaints: No issues overnite  Afeb Review of Systems - Negative except weakness on right side   Past Medical History  Diagnosis Date  . Hypertension   . Cancer     History   Social History  . Marital Status: Married    Spouse Name: N/A    Number of Children: N/A  . Years of Education: N/A   Occupational History  . Not on file.   Social History Main Topics  . Smoking status: Former Research scientist (life sciences)  . Smokeless tobacco: Not on file  . Alcohol Use: Yes     Comment: occasionally  . Drug Use: No  . Sexual Activity: Not on file   Other Topics Concern  . Not on file   Social History Narrative    Past Surgical History  Procedure Laterality Date  . Appendectomy    . Kidney surgery      Family History  Problem Relation Age of Onset  . CAD Father     No Known Allergies  No current facility-administered medications on file prior to encounter.   Current Outpatient Prescriptions on File Prior to Encounter  Medication Sig Dispense Refill  . hydrochlorothiazide (HYDRODIURIL) 25 MG tablet Take 25 mg by mouth daily.      BP 151/57 mmHg  Pulse 61  Temp(Src) 98.1 F (36.7 C) (Oral)  Resp 18  Ht 5' 9"  (1.753 m)  Wt 65.6 kg (144 lb 10 oz)  BMI 21.35 kg/m2  SpO2 99%    Patient Vitals for the past 24 hrs:  BP Temp Temp src Pulse Resp SpO2 Weight  07/21/14 0649 - - - - - - 65.6 kg (144 lb 10 oz)  07/21/14 0431 (!) 151/57 mmHg 98.1 F (36.7 C) Oral 61 18 99 % -  07/20/14 1442 (!) 116/55 mmHg 97.9 F (36.6 C) Oral 66 18 97 % -     Intake/Output Summary (Last 24 hours) at 07/21/14 0901 Last data filed at 07/21/14 0713  Gross per 24 hour  Intake    840 ml  Output   1054 ml  Net   -214 ml    Objective: Vital Signs: Blood pressure 151/57, pulse 61,  temperature 98.1 F (36.7 C), temperature source Oral, resp. rate 18, height 5' 9"  (1.753 m), weight 65.6 kg (144 lb 10 oz), SpO2 99 %. No results found. Results for orders placed or performed during the hospital encounter of 07/02/14 (from the past 72 hour(s))  Creatinine, serum     Status: Abnormal   Collection Time: 07/20/14  6:15 AM  Result Value Ref Range   Creatinine, Ser 0.70 0.50 - 1.35 mg/dL   GFR calc non Af Amer 83 (L) >90 mL/min   GFR calc Af Amer >90 >90 mL/min    Comment: (NOTE) The eGFR has been calculated using the CKD EPI equation. This calculation has not been validated in all clinical situations. eGFR's persistently <90 mL/min signify possible Chronic Kidney Disease.      HEENT: normal, no evidence of thrush Cardio: RRR Resp: CTA B/L, no resp distress GI: BS positive and NT,ND Extremity:  Edema Right hand and Right foot Skin:   IV site right dorsal hand Neuro: Alert; right hemiparesis Left side 5/5 in UE and LE Musc/Skel:  Other no pain with Right shoulder ROM Gen  NAD   Assessment/Plan: 1. Functional deficits secondary to left paramedian pontine infarct 2. DVT Prophylaxis/Anticoagulation: Pharmaceutical: Lovenox 3. Pain Management: N/A 4. Mood: LCSW to follow for evaluation and support.  5. Neuropsych: This patient is not capable of making decisions on his own behalf. 6. Skin/Wound Care:hx melanoma removal scalp, resume vaseline,healing very well eschar is off dermatologist Routine pressure relief measures. Turn patient every 2 hours when in bed.  7. Fluids/Electrolytes/Nutrition: Monitor I/O. Check daily weights to monitor for signs of overload.  8. Acute renal insufficiency: Improved. Off HCTZ at this time. 9. . HTN: Will monitor every 8 hours and allow permissive HTN for adequate perfusion. 10.  Leucocytosis:improved on last CBC will recheck    LOS (Days) 19 A FACE TO FACE EVALUATION WAS PERFORMED  [name] 07/21/2014,  9:00 AM

## 2014-07-21 NOTE — Progress Notes (Signed)
Occupational Therapy Session Note  Patient Details  Name: [name] MRN: [id number] Date of Birth: 1925-12-31  Today's Date: 07/21/2014 OT Individual Time:  -   1500-1600  (60 min)      Short Term Goals: Week 1:  OT Short Term Goal 1 (Week 1): Pt will complete toilet transfer with max assist +1 OT Short Term Goal 1 - Progress (Week 1): Progressing toward goal OT Short Term Goal 2 (Week 1): Pt will complete LB dressing with max assist +1 OT Short Term Goal 2 - Progress (Week 1): Progressing toward goal OT Short Term Goal 3 (Week 1): Pt will complete UB dressing with max assist  OT Short Term Goal 3 - Progress (Week 1): Met OT Short Term Goal 4 (Week 1): Pt will sit unsupported during functional task for 2 min with min assist  OT Short Term Goal 4 - Progress (Week 1): Progressing toward goal Week 2:  OT Short Term Goal 1 (Week 2): Pt will peform UB dressing with Mod A in order to increase I in self care. OT Short Term Goal 1 - Progress (Week 2): Met OT Short Term Goal 2 (Week 2): Pt will perform shower transfer with Max A of 1 in order to increase i in self care. OT Short Term Goal 2 - Progress (Week 2): Met OT Short Term Goal 3 (Week 2): Pt will perform grooming with min A in order to increase I in self care. OT Short Term Goal 3 - Progress (Week 2): Met OT Short Term Goal 4 (Week 2): Pt will perform LB dressing with Max A of 1 in order to increase i in self care.  OT Short Term Goal 4 - Progress (Week 2): Progressing toward goal Week 3:  OT Short Term Goal 1 (Week 3): Patient's caregiver will demonstrate proper technique of PROM to R UE for stretching.  OT Short Term Goal 2 (Week 3): Pt will perform LB bathing and dressing with Max A of 1 in order to decrease level of assistance for self care.  OT Short Term Goal 3 (Week 3): Pt will perform toilet transfer with Mod A in order to decrease assistance in functional transfers. OT Short Term Goal 4 (Week 3): Pt will perform shower  transfer with Mod A in order to decrease assistance in functional transfers.  Skilled Therapeutic Interventions/Progress Updates:    Addressed functional transfers, sit to stand, standing balance, attending to RUE, Sitting balance.  Pt. Requested to use toilet.  Transferred from wc to toilet with max assist.  Needed 2nd person for toileting and peri care.  Pt came to standing but had rounded posture.  Stood for 30 seconds before needing to sit.  Instructed pt on positioning RUE so as to stabilized it and keep it from falling and hanging.  He required max assist to attend.  He sat on regular toilet with minimal assist and cues to stay upright.  Transferred back to wc and then to bed with max assist.    Therapy Documentation Precautions:  Precautions Precautions: Fall Precaution Comments: R hemiplegia, R gaze preference Restrictions Weight Bearing Restrictions: No      Pain: Pain Assessment Pain Assessment: 5/10 back with leaning over during transfers           See FIM for current functional status  Therapy/Group: Individual Therapy  Lisa Roca 07/21/2014, 6:28 PM

## 2014-07-21 NOTE — Progress Notes (Signed)
Speech Language Pathology Daily Session Note  Patient Details  Name: [name] MRN: [id number] Date of Birth: Feb 25, 1926  Today's Date: 07/21/2014 SLP Individual Time: 3846-6599 SLP Individual Time Calculation (min): 30 min  Short Term Goals: Week 3: SLP Short Term Goal 1 (Week 3): Patient will consume Dys.3 textures and nectar-thick liquids via cup with Supervision verbal cues to utilize safe swallow strategies to minimize overt s/s of aspiration. SLP Short Term Goal 2 (Week 3): Patient will demonstrate readiness for repeat objective swallow assessment with improved managment of oral secreations with no more than 5 cues needed to manage secreations in a 30 minute session. SLP Short Term Goal 3 (Week 3): Patient will produce intelligible speech at the phrase level with Min multimodal cues to utilize compensatory strategies.  Skilled Therapeutic Interventions:  Pt was seen for skilled ST targeting goals for speech intelligibility.  Upon arrival, pt was seated upright in wheelchair, awake, alert, and agreeable to participate in ST with encouragement.  Per pt's wife, pt was in "bad humor" today.  Pt would not elaborate as to why but was easily redirected to structured therapeutic tasks.  SLP facilitated the session with a 4-step sequencing task targeting use of dysarthria strategies in the context of picture description.  Pt required overall min assist for use of loud voice and slow rate to improve speech intelligibility to ~80% at the phrase level.  Pt with x1 instance of needing max assist for use of strategies, which SLP suspects was related to pt being internally distracted.  Therefore, SLP reviewed and reinforced the need for careful and deliberate use of compensatory strategies, particularly in novel and challenging settings.  Pt and pt's wife verbalized understanding.  Continue per current plan of care.    FIM:  Comprehension Comprehension Mode: Auditory Comprehension: 5-Understands  basic 90% of the time/requires cueing < 10% of the time Expression Expression Mode: Verbal Expression: 4-Expresses basic 75 - 89% of the time/requires cueing 10 - 24% of the time. Needs helper to occlude trach/needs to repeat words. Social Interaction Social Interaction: 5-Interacts appropriately 90% of the time - Needs monitoring or encouragement for participation or interaction. Problem Solving Problem Solving: 4-Solves basic 75 - 89% of the time/requires cueing 10 - 24% of the time Memory Memory: 3-Recognizes or recalls 50 - 74% of the time/requires cueing 25 - 49% of the time FIM - Eating Eating Activity: 5: Needs verbal cues/supervision  Pain Pain Assessment Pain Assessment: No/denies pain  Therapy/Group: Individual Therapy  Asuncion Tapscott, Selinda Orion 07/21/2014, 3:25 PM

## 2014-07-21 NOTE — Progress Notes (Addendum)
Physical Therapy Session Note  Patient Details  Name: [name] MRN: [id number] Date of Birth: 10/02/1925  Today's Date: 07/21/2014 PT Individual Time: 0829-0859 PT Individual Time Calculation (min): 30 min   Short Term Goals: Week 3:  PT Short Term Goal 1 (Week 3): Pt to perform bed mobility with mod Ax1person PT Short Term Goal 2 (Week 3): Pt to perform bed<>w/c transfer with min Ax1person PT Short Term Goal 3 (Week 3): Pt to maintain dynamic sitting balance with min Ax1person PT Short Term Goal 4 (Week 3): Pt to propel w/c 69' with mod Ax1person PT Short Term Goal 5 (Week 3): Pt to ambulate 25' with LRAD and Ax2persons   Therapy Documentation Precautions:  Precautions Precautions: Fall Precaution Comments: R hemiplegia, R gaze preference Restrictions Weight Bearing Restrictions: No Pain: Pain Assessment Pain Assessment: No/denies pain  Mat Mobility:  Supine to short sit edge of mat with max assist verbal cues for  LLE management.   Transfers: Squat Pivot max assist from bed to wheelchair.   Sit to and from stand transfer with moderate assist; verbal cues for foot placement, even weight distribution and controlled descent.   Ambulation: Patient ambulated 40 feet with let hemiwalker and right toe off brace and moderate assist.  Patient ambulated with a step to gait pattern. Patient with a forward flexed posture. Patient required mod assist for hip extension, advancement, placement, and stabilization of RLE.  Patient able to initiate advancement of RLE with increased lateral weight shift. Patient required verbal cues for proper sequence and technique.    Patient returned to room at end of session in wheelchair with Wheelchair seat belt engaged and call bell within reach. Spouse present at end of session.  Patient tolerated treatment well. Vitals monitored and remained stable throughout session responding appropriately to activity. Patient was without pain during session.  Patient tolerated session with rest breaks throughout. Patient educated not to be up without assistance. Patient verbalized understanding. Plan of care to address, safety, energy conservation techniques, functional mobility, dynamic balance, endurance, and strengthening to ensure a safe discharge home and maximize independence.  Therapy/Group: Individual Therapy  Retta Diones 07/21/2014, 12:19 PM

## 2014-07-22 NOTE — Progress Notes (Signed)
Patient ID: [name], male   DOB: 06-09-1926, 80 y.o.   MRN: [id number]   Patient ID: [name], male   DOB: [birth year], 80 y.o.   MRN: [id number]   07/21/14.  80 year old male admitted for CIR with functional deficits secondary to left paramedian pontine infarct  Subjective/Complaints: No issues overnite.  Remains alert and offers no complaints  Afeb Review of Systems - Negative except weakness on right side   Past Medical History  Diagnosis Date  . Hypertension   . Cancer     History   Social History  . Marital Status: Married    Spouse Name: N/A    Number of Children: N/A  . Years of Education: N/A   Occupational History  . Not on file.   Social History Main Topics  . Smoking status: Former Research scientist (life sciences)  . Smokeless tobacco: Not on file  . Alcohol Use: Yes     Comment: occasionally  . Drug Use: No  . Sexual Activity: Not on file   Other Topics Concern  . Not on file   Social History Narrative    Past Surgical History  Procedure Laterality Date  . Appendectomy    . Kidney surgery      Family History  Problem Relation Age of Onset  . CAD Father     No Known Allergies  No current facility-administered medications on file prior to encounter.   Current Outpatient Prescriptions on File Prior to Encounter  Medication Sig Dispense Refill  . hydrochlorothiazide (HYDRODIURIL) 25 MG tablet Take 25 mg by mouth daily.      BP 137/56 mmHg  Pulse 61  Temp(Src) 98.3 F (36.8 C) (Oral)  Resp 17  Ht 5' 9"  (1.753 m)  Wt 65.6 kg (144 lb 10 oz)  BMI 21.35 kg/m2  SpO2 97%    Patient Vitals for the past 24 hrs:  BP Temp Temp src Pulse Resp SpO2  07/22/14 0548 (!) 137/56 mmHg 98.3 F (36.8 C) Oral 61 17 97 %  07/21/14 1450 (!) 117/51 mmHg 97.8 F (36.6 C) Oral 74 18 100 %     Intake/Output Summary (Last 24 hours) at 07/22/14 0830 Last data filed at 07/22/14 0558  Gross per 24 hour  Intake   1045 ml  Output    225 ml  Net    820 ml     Objective: Vital Signs: Blood pressure 137/56, pulse 61, temperature 98.3 F (36.8 C), temperature source Oral, resp. rate 17, height 5' 9"  (1.753 m), weight 65.6 kg (144 lb 10 oz), SpO2 97 %. No results found. Results for orders placed or performed during the hospital encounter of 07/02/14 (from the past 72 hour(s))  Creatinine, serum     Status: Abnormal   Collection Time: 07/20/14  6:15 AM  Result Value Ref Range   Creatinine, Ser 0.70 0.50 - 1.35 mg/dL   GFR calc non Af Amer 83 (L) >90 mL/min   GFR calc Af Amer >90 >90 mL/min    Comment: (NOTE) The eGFR has been calculated using the CKD EPI equation. This calculation has not been validated in all clinical situations. eGFR's persistently <90 mL/min signify possible Chronic Kidney Disease.      HEENT: normal, no evidence of thrush Cardio: RRR Resp: CTA B/L, no resp distress GI: BS positive and NT,ND Extremity:  Edema Right hand and Right foot Skin:   IV site right dorsal hand Neuro: Alert; right hemiparesis Left side 5/5 in UE and LE Musc/Skel:  Other no pain with Right shoulder ROM Gen NAD   Assessment/Plan: 1. Functional deficits secondary to left paramedian pontine infarct 2. DVT Prophylaxis/Anticoagulation: Pharmaceutical: Lovenox 3. Pain Management: N/A 4. Mood: LCSW to follow for evaluation and support.  5. Neuropsych: This patient is not capable of making decisions on his own behalf. 6. Skin/Wound Care:hx melanoma removal scalp, resume vaseline,healing very well eschar is off dermatologist Routine pressure relief measures. Turn patient every 2 hours when in bed.  7. Fluids/Electrolytes/Nutrition: Monitor I/O. Check daily weights to monitor for signs of overload.  8. Acute renal insufficiency: Improved. Off HCTZ at this time. 9. . HTN: Will monitor every 8 hours and allow permissive HTN for adequate perfusion. 10.  Leucocytosis:improved on last CBC will recheck    LOS (Days) 20 A FACE TO FACE  EVALUATION WAS PERFORMED  [name] 07/22/2014, 8:30 AM

## 2014-07-23 ENCOUNTER — Inpatient Hospital Stay (HOSPITAL_COMMUNITY): Payer: Medicare Other | Admitting: Speech Pathology

## 2014-07-23 ENCOUNTER — Inpatient Hospital Stay (HOSPITAL_COMMUNITY): Payer: Medicare Other | Admitting: Rehabilitation

## 2014-07-23 ENCOUNTER — Ambulatory Visit (HOSPITAL_COMMUNITY): Payer: Medicare Other

## 2014-07-23 NOTE — Progress Notes (Signed)
Physical Therapy Session Note  Patient Details  Name: [name] MRN: [id number] Date of Birth: Oct 18, 1925  Today's Date: 07/23/2014 PT Individual Time: 4627-0350 PT Individual Time Calculation (min): 68 min   Short Term Goals: Week 3:  PT Short Term Goal 1 (Week 3): Pt to perform bed mobility with mod Ax1person PT Short Term Goal 2 (Week 3): Pt to perform bed<>w/c transfer with min Ax1person PT Short Term Goal 3 (Week 3): Pt to maintain dynamic sitting balance with min Ax1person PT Short Term Goal 4 (Week 3): Pt to propel w/c 19' with mod Ax1person PT Short Term Goal 5 (Week 3): Pt to ambulate 25' with LRAD and Ax2persons  Skilled Therapeutic Interventions/Progress Updates:   Pt received sitting in w/c in room, agreeable to therapy session.  Wife present to observe.  Note that pt very congested during session leading to increased heartburn type symptoms causing increased distractions during gait, however skilled session continues to focus on gait with hemi walker for upright posture increased weight shift to the L to decrease pusher tendencies, WB through RLE, quad/glute control in R stance phase of gait and anterior pelvic tilt for hip extension.  Performed several bouts of gait x 20 to 25' x 4 reps with R reaction AFO.  Continue to note activation of R hip and knee flex, however unable to fully clear RLE without assist.  Also requires assist/approximation through R LE for control of quad and to prevent knee hyperextension.  Provided heavy assist for forward weight shift and hip extension with max verbal cues for posture and sequencing with hemi walker.  Pt limited by feeling like "pills are coming back up."  Provided several seated rest breaks and attempted to have pt drink apple juice to "wash down" pills.  He then reported feeling nauseated, therefore assisted back to room.  Upon assisting pt back to bed at max A level (max verbal cues for not pushing with LUE), noted that pt may be  incontinent of bowel.  Upon lying him flat, pt became nauseated and needed to spit up again, therefore assisted back into upright position in bed.  Notified nurse tech of needing to check for bowel incontinence, however also wanting pts stomach to settle prior to lying back down.  Nurse tech to check on him.  Left in bed with bed alarm set and all needs in reach.   Therapy Documentation Precautions:  Precautions Precautions: Fall Precaution Comments: R hemiplegia, R gaze preference Restrictions Weight Bearing Restrictions: No  Pain: Pt reports discomfort in throat and stomach.  RN made aware.    Locomotion : Ambulation Ambulation/Gait Assistance: 2: Max assist;1: +2 Total assist   See FIM for current functional status  Therapy/Group: Individual Therapy and co-treat with RT  [name] 07/23/2014, 12:35 PM

## 2014-07-23 NOTE — Progress Notes (Signed)
Subjective/Complaints:wants to be pushed. Denies pain. slept fairly well Review of Systems - Negative except weakness on right side Objective: Vital Signs: Blood pressure 147/58, pulse 84, temperature 97.8 F (36.6 C), temperature source Oral, resp. rate 19, height 5\' 9"  (1.753 m), weight 65.6 kg (144 lb 10 oz), SpO2 97 %. No results found. No results found for this or any previous visit (from the past 72 hour(s)).   HEENT: normal, no evidence of thrush Cardio: RRR Resp: CTA B/L, no resp distress GI: BS positive and NT,ND Extremity:  Edema Right hand and Right foot Skin:   Wound vertex of scalp, eschar Neuro: Lethargic, Cranial Nerve Abnormalities Right central 7, Abnormal Sensory reduced on left side to LT, Abnormal Motor 0/5 on right UE except trace at finger and wrist flexors  2-Right foot plantar flexion and Tone:  Emerging tone RLE---1-2/4. DTR's 3+ Left side 5/5 in UE and LE Musc/Skel:  Other no pain with Right shoulder ROM. Right heel cord tight Gen NAD   Assessment/Plan: 1. Functional deficits secondary to left paramedian pontine infarct Right hemiparesis which require 3+ hours per day of interdisciplinary therapy in a comprehensive inpatient rehab setting. Physiatrist is providing close team supervision and 24 hour management of active medical problems listed below. Physiatrist and rehab team continue to assess barriers to discharge/monitor patient progress toward functional and medical goals.  Marland KitchenFIM: FIM - Bathing Bathing Steps Patient Completed: Chest, Right Arm, Left Arm, Front perineal area, Right upper leg, Left upper leg Bathing: 3: Mod-Patient completes 5-7 58f 10 parts or 50-74%  FIM - Upper Body Dressing/Undressing Upper body dressing/undressing steps patient completed: Thread/unthread right sleeve of pullover shirt/dresss, Put head through opening of pull over shirt/dress Upper body dressing/undressing: 2: Max-Patient completed 25-49% of tasks FIM - Lower Body  Dressing/Undressing Lower body dressing/undressing steps patient completed: Thread/unthread left pants leg Lower body dressing/undressing: 1: Two helpers  FIM - Toileting Toileting: 1: Total-Patient completed zero steps, helper did all 3  FIM - Radio producer Devices: Grab bars Toilet Transfers: 2-To toilet/BSC: Max A (lift and lower assist), 2-From toilet/BSC: Max A (lift and lower assist)  FIM - Engineer, site Assistive Devices: Arm rests Bed/Chair Transfer: 3: Bed > Chair or W/C: Mod A (lift or lower assist), 3: Chair or W/C > Bed: Mod A (lift or lower assist)  FIM - Locomotion: Wheelchair Distance: 25 Locomotion: Wheelchair: 0: Activity did not occur FIM - Locomotion: Ambulation Locomotion: Ambulation Assistive Devices: Museum/gallery curator Ambulation/Gait Assistance: 2: Max assist Locomotion: Ambulation: 2: Travels 50 - 149 ft with moderate assistance (Pt: 50 - 74%)  Comprehension Comprehension Mode: Auditory Comprehension: 5-Understands basic 90% of the time/requires cueing < 10% of the time  Expression Expression Mode: Verbal Expression: 4-Expresses basic 75 - 89% of the time/requires cueing 10 - 24% of the time. Needs helper to occlude trach/needs to repeat words.  Social Interaction Social Interaction: 5-Interacts appropriately 90% of the time - Needs monitoring or encouragement for participation or interaction.  Problem Solving Problem Solving: 4-Solves basic 75 - 89% of the time/requires cueing 10 - 24% of the time  Memory Memory: 4-Recognizes or recalls 75 - 89% of the time/requires cueing 10 - 24% of the time   Medical Problem List and Plan: 1. Functional deficits secondary to left paramedian pontine infarct 2. DVT Prophylaxis/Anticoagulation: Pharmaceutical: Lovenox 3. Pain Management: N/A 4. Mood: LCSW to follow for evaluation and support.  5. Neuropsych: This patient is not capable of making decisions  on  his own behalf. 6. Skin/Wound Care:hx melanoma removal scalp, resume vaseline,healing very well eschar is off dermatologist Routine pressure relief measures. Turn patient every 2 hours when in bed.  7. Fluids/Electrolytes/Nutrition: Monitor I/O. Check daily weights to monitor for signs of overload.  8. Acute renal insufficiency: Improved. Off HCTZ at this time. 10. HTN: Will monitor every 8 hours and allow permissive HTN for adequate perfusion. 11.  Leucocytosis:improved on last CBC will recheck    LOS (Days) 21 A FACE TO FACE EVALUATION WAS PERFORMED  [name] T 07/23/2014, 9:10 AM

## 2014-07-23 NOTE — Progress Notes (Signed)
Physical Therapy Session Note  Patient Details  Name: [name] MRN: [id number] Date of Birth: 1926/02/16  Today's Date: 07/23/2014 PT Co-Treatment Time: 1300 (whole session was 1300-1400 w/ OT)-1330 PT Co-Treatment Time Calculation (min): 30 min  Short Term Goals: Week 3:  PT Short Term Goal 1 (Week 3): Pt to perform bed mobility with mod Ax1person PT Short Term Goal 2 (Week 3): Pt to perform bed<>w/c transfer with min Ax1person PT Short Term Goal 3 (Week 3): Pt to maintain dynamic sitting balance with min Ax1person PT Short Term Goal 4 (Week 3): Pt to propel w/c 68' with mod Ax1person PT Short Term Goal 5 (Week 3): Pt to ambulate 25' with LRAD and Ax2persons  Skilled Therapeutic Interventions/Progress Updates:   Pt received lying in bed, agreeable to therapy session.  Skilled co-treat with OT during session to focus on functional transfers, bed mobility, stretching to cervical, thoracic, and lumbar spine, hip flexor stretch, hamstring and glute stretch BLEs x 2-3 mins each.  Also worked on sit<>stand transfers with focus on midline orientation, increased weight shift to the L, forward weight shift, and increased use of R glute/quad activation.  Requires max A for bed mobility with step by step cues on attention to RUE, rolling technique, lowering legs out of bed, and also pushing through LUE to shift weight to the R.  Transferred bed>w/c and w/c<>mat at max A level (heavier max to the L due to pushing).  MAX verbal cues and facilitation for forward weight shift and trunk lean in order to increase WB through LEs and elevate buttocks.  Performed stretching as mentioned above while lying on small wedge.  See OT note on RUE ROM.  Ended session with focus on forward weight shift to stand and reaching to the L.  Requires +2 assist for upright posture and weight shift.  Note that pt with pain in R side of neck with tight levator and traps.  See OT note regarding placement of kinesiotape.  Pt  transferred back to w/c and left in w/c in room with family present, all needs in reach and quick release belt donned.   Therapy Documentation Precautions:  Precautions Precautions: Fall Precaution Comments: R hemiplegia, R gaze preference Restrictions Weight Bearing Restrictions: No   Vital Signs: Therapy Vitals Temp: 98.6 F (37 C) Temp Source: Oral Pulse Rate: 79 Resp: 16 BP: 114/61 mmHg Patient Position (if appropriate): Sitting Oxygen Therapy SpO2: 100 % O2 Device: Not Delivered Pain: Pt with no c/o pain during session.    Locomotion : Ambulation Ambulation/Gait Assistance: 2: Max assist;1: +2 Total assist   See FIM for current functional status  Therapy/Group: Co-Treatment  Jeanetta Alonzo, Betha Loa 07/23/2014, 4:04 PM

## 2014-07-23 NOTE — Progress Notes (Signed)
Occupational Therapy Session Note  Patient Details  Name: [name] MRN: [id number] Date of Birth: 10/12/1925  Today's Date: 07/23/2014 OT Co-Treatment Time: 1330 (1300-1400 cotx with PT EP)-1400 OT Co-Treatment Time Calculation (min): 30 min   Short Term Goals: Week 3:  OT Short Term Goal 1 (Week 3): Patient's caregiver will demonstrate proper technique of PROM to R UE for stretching.  OT Short Term Goal 2 (Week 3): Pt will perform LB bathing and dressing with Max A of 1 in order to decrease level of assistance for self care.  OT Short Term Goal 3 (Week 3): Pt will perform toilet transfer with Mod A in order to decrease assistance in functional transfers. OT Short Term Goal 4 (Week 3): Pt will perform shower transfer with Mod A in order to decrease assistance in functional transfers.  Skilled Therapeutic Interventions/Progress Updates:   Pt seen for therapeutic co-tx with PT (EP) with focus on weight shifting, functional transfers, streching of cervical and thoracic spine. Pt received supine in bed agreeable to therapy and reporting feeling better this PM. Completed supine>sit then squat pivot transfer bed>w/c with max assist and max cues for anterior weight shift. Transferred to mat table in gym with max assist and cues for anterior weight shift. Engaged in therapeutic stretching to cervical and thoracic spine with use of 2# hand weight in RUE to provide extra stretch. Therapist also provided PROM to LUE and kinesiotape to neck to facilitate cervical extension.  PT provided stretch to hamstrings and hip flexors. Engaged in reaching task with use of horseshoes and emphasis on anterior weight shift to transition sit<>stand. Once in standing, emphasis on weight shifting and postural control. Pt transferred back to w/c and returned to room. Pt left sitting in w/c with family present.   Therapy Documentation Precautions:  Precautions Precautions: Fall Precaution Comments: R hemiplegia, R  gaze preference Restrictions Weight Bearing Restrictions: No General:   Vital Signs: Therapy Vitals Temp: 98.6 F (37 C) Temp Source: Oral Pulse Rate: 79 Resp: 16 BP: 114/61 mmHg Patient Position (if appropriate): Sitting Oxygen Therapy SpO2: 100 % O2 Device: Not Delivered Pain: No report of pain  See FIM for current functional status  Therapy/Group: Co-Treatment  Arthi Mcdonald N 07/23/2014, 3:20 PM

## 2014-07-23 NOTE — Progress Notes (Signed)
Speech Language Pathology Daily Session Note  Patient Details  Name: [name] MRN: [id number] Date of Birth: 10-Aug-1925  Today's Date: 07/23/2014 SLP Individual Time: 0830-0930 SLP Individual Time Calculation (min): 60 min  Short Term Goals: Week 3: SLP Short Term Goal 1 (Week 3): Patient will consume Dys.3 textures and nectar-thick liquids via cup with Supervision verbal cues to utilize safe swallow strategies to minimize overt s/s of aspiration. SLP Short Term Goal 2 (Week 3): Patient will demonstrate readiness for repeat objective swallow assessment with improved managment of oral secreations with no more than 5 cues needed to manage secreations in a 30 minute session. SLP Short Term Goal 3 (Week 3): Patient will produce intelligible speech at the phrase level with Min multimodal cues to utilize compensatory strategies. SLP Short Term Goal 4 (Week 3): Patient will attend to right upper extremity during basic functional tasks with Mod question cues.  Skilled Therapeutic Interventions: Skilled treatment session focused on addressing dysphagia and self-care goals.  SLP facilitated session with set-up of oral care and water via cup with cough in 3/10 trials with Min verbal cues for small portions.  Patient also demonstrated increased ability to manage oral secretions with cues x1 for saliva management during a 30 minute time period.  SLP unable to determine cause of intermittent overt s/s of aspiration at bedside; as a result, a repeat objective assessment is warranted.  SLP also facilitated session with Max faded to Mod multimodal cues to attend to and initiate care of right upper extremity during basic functional tasks; as a result, short term goals modified.       FIM:  Comprehension Comprehension Mode: Auditory Comprehension: 5-Follows basic conversation/direction: With extra time/assistive device Expression Expression Mode: Verbal Expression: 4-Expresses basic 75 - 89% of the  time/requires cueing 10 - 24% of the time. Needs helper to occlude trach/needs to repeat words. Social Interaction Social Interaction: 5-Interacts appropriately 90% of the time - Needs monitoring or encouragement for participation or interaction. Problem Solving Problem Solving: 4-Solves basic 75 - 89% of the time/requires cueing 10 - 24% of the time Memory Memory: 4-Recognizes or recalls 75 - 89% of the time/requires cueing 10 - 24% of the time  Pain Pain Assessment Pain Assessment: No/denies pain  Therapy/Group: Individual Therapy  Carmelia Roller., CCC-SLP [phone]  [place] 07/23/2014, 4:52 PM

## 2014-07-24 ENCOUNTER — Inpatient Hospital Stay (HOSPITAL_COMMUNITY): Payer: Medicare Other | Admitting: Rehabilitation

## 2014-07-24 ENCOUNTER — Inpatient Hospital Stay (HOSPITAL_COMMUNITY): Payer: Medicare Other

## 2014-07-24 ENCOUNTER — Inpatient Hospital Stay (HOSPITAL_COMMUNITY): Payer: Medicare Other | Admitting: Speech Pathology

## 2014-07-24 ENCOUNTER — Inpatient Hospital Stay (HOSPITAL_COMMUNITY): Payer: Medicare Other | Admitting: *Deleted

## 2014-07-24 ENCOUNTER — Ambulatory Visit (HOSPITAL_COMMUNITY): Payer: Medicare Other

## 2014-07-24 NOTE — Procedures (Addendum)
Objective Swallowing Evaluation: Modified Barium Swallowing Study  Patient Details  Name: [name] MRN: [id number] Date of Birth: April 27, 1926  Today's Date: 07/24/2014 Time: 0900-0930 30 minutes   Past Medical History:  Past Medical History  Diagnosis Date  . Hypertension   . Cancer    Past Surgical History:  Past Surgical History  Procedure Laterality Date  . Appendectomy    . Kidney surgery     HPI:  [name] is a 80 y.o. male with history of hypertension and prostate cancer. He had a productive cough for few days prior to admission. Patient had gone to his PCP on day of admission and was prescribed antibiotics for possible pneumonia. Later patient's family called EMS as they found that patient was having right facial droop with right-sided hemiplegia and was brought to the ER. MRI shows an acute nonhemorrhagic left paramedian pontine infarct. Patient admitted to inpatient rehabilitation and has been participatign in therapies with increased ability to manage secreations with intermittent s/s of aspiration with thin liquids as a result, repeat objective assessment warranted.       Recommendation/Prognosis  Clinical Impression:   Dysphagia Diagnosis: Mild oral phase dysphagia;Moderate pharyngeal phase dysphagia Clinical impression: Patient demonstrates a mild oral and moderate pharyngeal sensorimotor dysphagia; greatest improvements in oral phase as compared to previous objective assessment.   Oral phase is marked by decreased mastication of regular textures and incomplete tongue to palate contact resulting in trace oral residue of all consistencies. Patient also demonstrates slight premature spillage to the valleculae, which leads to intermittent silent penetration and aspiration of thin liquids.  Post swallow residues are observed throughout the pharynx and cleared with a cued second swallow.  Use of head down posture with a small straw sip, throat clear and second  swallow with thin liquids was effective at protecting airway; however patient required Max cues to consistently execute this.  Recommend continuation of current diet order with meds administered whole in puree and thin liquid trials addressed in ongoing skilled therapy sessions.     SLP educated patient and family regarding regarding recommendations and prognosis for swallow function recovery.     Swallow Evaluation Recommendations:  Diet Recommendations: Dysphagia 3 (Mechanical Soft);Nectar-thick liquid Liquid Administration via: Cup;Straw Medication Administration: Whole meds with puree Supervision: Patient able to self feed;Full supervision/cueing for compensatory strategies Compensations: Slow rate;Small sips/bites;Multiple dry swallows after each bite/sip;Check for pocketing;Check for anterior loss Postural Changes and/or Swallow Maneuvers: Seated upright 90 degrees;Upright 30-60 min after meal Oral Care Recommendations: Oral care BID;Oral care before and after PO Other Recommendations: Order thickener from pharmacy;Prohibited food (jello, ice cream, thin soups);Remove water pitcher;Have oral suction available Follow up Recommendations: 24 hour supervision/assistance;Home health SLP    Prognosis:  Prognosis for Safe Diet Advancement: Good Barriers to Reach Goals: Cognitive deficits   Individuals Consulted: Consulted and Agree with Results and Recommendations: Patient;Family member/caregiver Family Member Consulted: wife, daughter      SLP Assessment/Plan  Plan:  See care plan for details   Short Term Goals: Week 3: SLP Short Term Goal 1 (Week 3): Patient will consume Dys.3 textures and nectar-thick liquids via cup with Supervision verbal cues to utilize safe swallow strategies to minimize overt s/s of aspiration. SLP Short Term Goal 2 (Week 3): Patient will demonstrate readiness for repeat objective swallow assessment with improved managment of oral secreations with no more than 5  cues needed to manage secreations in a 30 minute session. SLP Short Term Goal 3 (Week 3): Patient  will produce intelligible speech at the phrase level with Min multimodal cues to utilize compensatory strategies. SLP Short Term Goal 4 (Week 3): Patient will attend to right upper extremity during basic functional tasks with Mod question cues.    General: Date of Onset: 06/27/14 Type of Study: Modified Barium Swallowing Study Reason for Referral: Objectively evaluate swallowing function Previous Swallow Assessment: MBS 12/4 Dys.2 with nectar-thick liquids  Diet Prior to this Study: Dysphagia 3 (soft);Nectar-thick liquids Temperature Spikes Noted: No Respiratory Status: Room air History of Recent Intubation: No Behavior/Cognition: Alert;Cooperative;Pleasant mood;Requires cueing Oral Cavity - Dentition: Missing dentition Oral Motor / Sensory Function: Impaired - see Bedside swallow eval Self-Feeding Abilities: Able to feed self;Needs assist;Needs set up Patient Positioning: Upright in chair Baseline Vocal Quality: Low vocal intensity Volitional Cough: Weak Volitional Swallow: Able to elicit Anatomy: Within functional limits Pharyngeal Secretions: Not observed secondary MBS   Reason for Referral:   Objectively evaluate swallowing function    Oral Phase: Oral Preparation/Oral Phase Oral Phase: Impaired Oral - Nectar Oral - Nectar Teaspoon: Not tested Oral - Nectar Cup: Within functional limits Oral - Nectar Straw: Within functional limits Oral - Thin Oral - Thin Cup: Right anterior bolus loss;Lingual/palatal residue Oral - Thin Straw: Right anterior bolus loss Oral - Solids Oral - Puree: Within functional limits Oral - Mechanical Soft: Not tested Oral - Regular: Impaired mastication   Pharyngeal Phase:  Pharyngeal Phase Pharyngeal Phase: Impaired Pharyngeal - Nectar Pharyngeal - Nectar Cup: Reduced anterior laryngeal mobility;Reduced laryngeal elevation;Reduced tongue base  retraction;Pharyngeal residue - valleculae Penetration/Aspiration details (nectar cup): Material does not enter airway Pharyngeal - Nectar Straw: Not tested Pharyngeal - Thin Pharyngeal - Thin Cup: Delayed swallow initiation;Reduced anterior laryngeal mobility;Reduced laryngeal elevation;Reduced tongue base retraction;Penetration/Aspiration during swallow;Pharyngeal residue - valleculae;Pharyngeal residue - posterior pharnyx;Premature spillage to valleculae Penetration/Aspiration details (thin cup): Material enters airway, remains ABOVE vocal cords and not ejected out;Material enters airway, passes BELOW cords without attempt by patient to eject out (silent aspiration) Pharyngeal - Thin Straw: Delayed swallow initiation;Premature spillage to valleculae;Reduced anterior laryngeal mobility;Reduced laryngeal elevation;Reduced tongue base retraction;Penetration/Aspiration during swallow;Pharyngeal residue - valleculae;Pharyngeal residue - posterior pharnyx;Compensatory strategies attempted (Comment) (cued throat clear and second swallow effective) Penetration/Aspiration details (thin straw): Material enters airway, remains ABOVE vocal cords and not ejected out Pharyngeal - Solids Pharyngeal - Puree: Reduced anterior laryngeal mobility;Reduced laryngeal elevation;Reduced tongue base retraction;Pharyngeal residue - valleculae Penetration/Aspiration details (puree): Material does not enter airway Pharyngeal - Mechanical Soft: Not tested Pharyngeal - Regular: Pharyngeal residue - valleculae;Reduced tongue base retraction;Reduced laryngeal elevation;Reduced anterior laryngeal mobility   Cervical Esophageal Phase  Cervical Esophageal Phase Cervical Esophageal Phase: WFL   GN        Gunnar Fusi, M.A., CCC-SLP [phone]   Iolani Twilley 07/24/2014, 7:23 PM

## 2014-07-24 NOTE — Progress Notes (Addendum)
NUTRITION FOLLOW UP  INTERVENTION: Provide Magic cup TID between meals, each supplement provides 290 kcal and 9 grams of protein. Ordered.  Continue Ensure Pudding po TID, each supplement provides 170 kcal and 4 grams of protein.  Discontinue 30 ml Prostat.  Encourage adequate PO intake.   NUTRITION DIAGNOSIS: Increased nutrient needs related to chronic illness as evidenced by estimated nutrition needs; ongoing  Goal: Pt to meet >/= 90% of their estimated nutrition needs; met  Monitor:  PO intake, weight trends, labs, I/O's  80 y.o. male  Admitting Dx: Left pontine stroke  ASSESSMENT: Pt with history of HTN, prostate cancer, recent PNA with malaise X 1 week who was admitted on 06/27/14 with right sided weakness with right gaze preference and difficulty talking. Patient was noted to be in A fib in setting of CAP. EKG evaluated by cardiology and felt to have multiple artifacts with 1 degree AVB and repeat EKG with NSR.   Meal completion has been 75-100%. PO intake has been improving. Pt has been taking his Ensure Pudding and Prostat. Per RN, pt's family has been bringing in food from home for pt to eat. Pt has been eating ~100% of food brought in. Pt would like Prostat discontinued. RD to modify orders. Pt was encouraged to eat his food at meals and to take his supplements.  Height: Ht Readings from Last 1 Encounters:  07/03/14 5' 9"  (1.753 m)    Weight: Wt Readings from Last 1 Encounters:  07/24/14 144 lb 6.4 oz (65.5 kg)    BMI:  Body mass index is 21.31 kg/(m^2).  Re-Estimated Nutritional Needs: Kcal: 1750-1950 Protein: 80-90 grams Fluid: 1.75- 1.95 L/day  Skin: wound on head, +1 LUE, +2 RLE edema  Diet Order: DIET DYS 3 with nectar thick liquids   Intake/Output Summary (Last 24 hours) at 07/24/14 1057 Last data filed at 07/23/14 1800  Gross per 24 hour  Intake    240 ml  Output      0 ml  Net    240 ml    Last BM: 12/28  Labs:   Recent Labs Lab  07/20/14 0615  CREATININE 0.70    CBG (last 3)  No results for input(s): GLUCAP in the last 72 hours.  Scheduled Meds: . antiseptic oral rinse  7 mL Mouth Rinse QID  . aspirin  325 mg Oral Daily  . atorvastatin  20 mg Oral q1800  . benzonatate  100 mg Oral TID  . enoxaparin (LOVENOX) injection  40 mg Subcutaneous Q24H  . feeding supplement (ENSURE)  1 Container Oral TID WC  . feeding supplement (PRO-STAT SUGAR FREE 64)  30 mL Oral TID BM  . magic mouthwash  10 mL Oral TID  . megestrol  200 mg Oral BID  . polycarbophil  625 mg Oral BID  . saccharomyces boulardii  250 mg Oral BID  . white petrolatum   Topical BID    Continuous Infusions: . sodium chloride 75 mL/hr at 07/23/14 0700    Past Medical History  Diagnosis Date  . Hypertension   . Cancer     Past Surgical History  Procedure Laterality Date  . Appendectomy    . Kidney surgery      Kallie Locks, MS, RD, LDN Pager # [phone] After hours/ weekend pager # [phone]

## 2014-07-24 NOTE — Progress Notes (Signed)
Physical Therapy Session Note  Patient Details  Name: [name] MRN: [id number] Date of Birth: [birth year]  Today's Date: 07/24/2014 PT Individual Time: 0800-0830 PT Individual Time Calculation (min): 30 min   Short Term Goals: Week 2:  PT Short Term Goal 1 (Week 2): Pt to perform bed mobility with mod Ax1person PT Short Term Goal 1 - Progress (Week 2): Progressing toward goal PT Short Term Goal 2 (Week 2): Pt to perform bed<>w/c transfer with mod Ax1person PT Short Term Goal 2 - Progress (Week 2): Met PT Short Term Goal 3 (Week 2): Pt to maintain dynamic sitting balance with min Ax1person PT Short Term Goal 3 - Progress (Week 2): Progressing toward goal PT Short Term Goal 4 (Week 2): Pt to propel w/c 41' with mod Ax1person PT Short Term Goal 4 - Progress (Week 2): Progressing toward goal PT Short Term Goal 5 (Week 2): Pt to ambulate 25' with LRAD and Ax2persons PT Short Term Goal 5 - Progress (Week 2): Met  Skilled Therapeutic Interventions/Progress Updates:    Patient received semi-reclined in bed. Patient initially refusing therapy due to not having breakfast, patient irritated/frustrated and making several comments about dissatisfaction with delivery time of breakfast. Located patient's breakfast and patient agreeable to sit EOB. Supine>sit from flat bed with use of bed rails and maxA. Sitting EOB, unsupported, required supervision-minA for sitting balance to eat breakfast. Patient requires cues for small bites/sips with meal and patient continues to be agitated with cues from therapist. Patient left with different therapist to supervise remainder of meal.  Patient demonstrating poor frustration tolerance throughout session, repeatedly criticizing various aspects of care.  Therapy Documentation Precautions:  Precautions Precautions: Fall Precaution Comments: R hemiplegia, R gaze preference Restrictions Weight Bearing Restrictions: No Pain: Pain Assessment Pain Assessment:  No/denies pain Pain Score: 0-No pain  See FIM for current functional status  Therapy/Group: Individual Therapy  Lillia Abed. Justus Duerr, PT, DPT 07/24/2014, 9:36 AM

## 2014-07-24 NOTE — Progress Notes (Signed)
Subjective/Complaints:  sleeping soundly upon entering the room. No specific complaints today Review of Systems - Negative except weakness on right side Objective: Vital Signs: Blood pressure 125/52, pulse 72, temperature 98.2 F (36.8 C), temperature source Oral, resp. rate 17, height 5\' 9"  (1.753 m), weight 65.5 kg (144 lb 6.4 oz), SpO2 97 %. No results found. No results found for this or any previous visit (from the past 72 hour(s)).   HEENT: normal, no evidence of thrush Cardio: RRR Resp: CTA B/L, no resp distress GI: BS positive and NT,ND Extremity:  Edema Right hand and Right foot Skin:   Wound vertex of scalp, eschar Neuro: Lethargic, Cranial Nerve Abnormalities Right central 7, Abnormal Sensory reduced on left side to LT, Abnormal Motor 0/5 on right UE except trace at finger and wrist flexors  2-Right foot plantar flexion and Tone:  Emerging tone RLE---1-2/4. DTR's 3+ Left side 5/5 in UE and LE Musc/Skel:  Other no pain with Right shoulder ROM. Right heel cord tight Gen NAD   Assessment/Plan: 1. Functional deficits secondary to left paramedian pontine infarct Right hemiparesis which require 3+ hours per day of interdisciplinary therapy in a comprehensive inpatient rehab setting. Physiatrist is providing close team supervision and 24 hour management of active medical problems listed below. Physiatrist and rehab team continue to assess barriers to discharge/monitor patient progress toward functional and medical goals.  Marland KitchenFIM: FIM - Bathing Bathing Steps Patient Completed: Chest, Right Arm, Left Arm, Front perineal area, Right upper leg, Left upper leg Bathing: 3: Mod-Patient completes 5-7 31f 10 parts or 50-74%  FIM - Upper Body Dressing/Undressing Upper body dressing/undressing steps patient completed: Thread/unthread right sleeve of pullover shirt/dresss, Put head through opening of pull over shirt/dress Upper body dressing/undressing: 2: Max-Patient completed 25-49% of  tasks FIM - Lower Body Dressing/Undressing Lower body dressing/undressing steps patient completed: Thread/unthread left pants leg Lower body dressing/undressing: 1: Two helpers  FIM - Toileting Toileting: 1: Total-Patient completed zero steps, helper did all 3  FIM - Radio producer Devices: Grab bars Toilet Transfers: 2-To toilet/BSC: Max A (lift and lower assist), 2-From toilet/BSC: Max A (lift and lower assist)  FIM - Engineer, site Assistive Devices: Arm rests Bed/Chair Transfer: 2: Supine > Sit: Max A (lifting assist/Pt. 25-49%), 2: Bed > Chair or W/C: Max A (lift and lower assist)  FIM - Locomotion: Wheelchair Distance: 25 Locomotion: Wheelchair: 1: Total Assistance/staff pushes wheelchair (Pt<25%) FIM - Locomotion: Ambulation Locomotion: Ambulation Assistive Devices: Museum/gallery curator Ambulation/Gait Assistance: 2: Max assist, 1: +2 Total assist Locomotion: Ambulation: 1: Two helpers (for chair follow)  Comprehension Comprehension Mode: Auditory Comprehension: 5-Follows basic conversation/direction: With extra time/assistive device  Expression Expression Mode: Verbal Expression: 4-Expresses basic 75 - 89% of the time/requires cueing 10 - 24% of the time. Needs helper to occlude trach/needs to repeat words.  Social Interaction Social Interaction: 5-Interacts appropriately 90% of the time - Needs monitoring or encouragement for participation or interaction.  Problem Solving Problem Solving: 4-Solves basic 75 - 89% of the time/requires cueing 10 - 24% of the time  Memory Memory: 4-Recognizes or recalls 75 - 89% of the time/requires cueing 10 - 24% of the time   Medical Problem List and Plan: 1. Functional deficits secondary to left paramedian pontine infarct 2. DVT Prophylaxis/Anticoagulation: Pharmaceutical: Lovenox 3. Pain Management: N/A 4. Mood: LCSW to follow for evaluation and support.  5. Neuropsych: This  patient is not capable of making decisions on his own behalf. 6. Skin/Wound  Care:hx melanoma removal scalp, vaseline  - Routine pressure relief measures.   -Turn patient every 2 hours when in bed.  7. Fluids/Electrolytes/Nutrition: Monitor I/O. Check daily weights to monitor for signs of overload.  8. Acute renal insufficiency: Improved. Off HCTZ at this time. 10. HTN: Will monitor every 8 hours and allow permissive HTN for adequate perfusion. 11.  Leucocytosis:improved on last CBC will recheck    LOS (Days) 22 A FACE TO FACE EVALUATION WAS PERFORMED  [name] T 07/24/2014, 8:44 AM

## 2014-07-24 NOTE — Progress Notes (Signed)
Occupational Therapy Session Note  Patient Details  Name: [name] MRN: [id number] Date of Birth: May 12, 1926  Today's Date: 07/24/2014 OT Individual Time: 1300-1400 OT Individual Time Calculation (min): 60 min   Skilled Therapeutic Interventions/Progress Updates:    Pt worked on Brewing technologist for sitting balance, trunk control, transitional movements, and mobility.  Pt initially unable to state the steps for transferring from the wheelchair to the EOM.  Discussed with pt that he needed to learn from what we were showing him and to quit making comments and distracting himself from the situation.  Explained that he needed to know the steps to all tasks performed so that he could carry them over session to session regardless of who he was working with.  Transferred Bobath technique to the mat on the right side.  Worked on sitting upright with focus on anterior pelvic tilt, lumbar extension, and cervical extension.  Pt with decreased ability to weightshift and flex trunk forward and to the left, toward the pusher side.  He worked on reaching for clothes pins placed out in front of him to encourage trunk flexion and then work on reaching laterally to the left side and place them on a horizontal bar.  Progressed to standing to attempt this but pt with decreased sustained attention to task.  In standing, pt frequently stated he needed to move his left foot forward because he felt unsteady and maintained trunk and cervical flexion.  Total +2 (pt 35%) to maintain standing.  Progressed to short distance mobility with +2 assist.  Pt with decreased ability to maintain stance phase on the RLE before stepping with the left.  Pt with moderate posterior lean in standing as well.       Therapy Documentation Precautions:  Precautions Precautions: Fall Precaution Comments: R hemiplegia, R gaze preference Restrictions Weight Bearing Restrictions: No  Pain: Pain Assessment Pain Assessment:  No/denies pain ADL: See FIM for current functional status  Therapy/Group: Individual Therapy  Gearald Stonebraker OTR/L 07/24/2014, 4:09 PM

## 2014-07-24 NOTE — Progress Notes (Signed)
Physical Therapy Session Note  Patient Details  Name: [name] MRN: [id number] Date of Birth: [birth year]  Today's Date: 07/24/2014 PT Individual Time: 1100-1207 PT Individual Time Calculation (min): 67 min   Short Term Goals: Week 3:  PT Short Term Goal 1 (Week 3): Pt to perform bed mobility with mod Ax1person PT Short Term Goal 2 (Week 3): Pt to perform bed<>w/c transfer with min Ax1person PT Short Term Goal 3 (Week 3): Pt to maintain dynamic sitting balance with min Ax1person PT Short Term Goal 4 (Week 3): Pt to propel w/c 87' with mod Ax1person PT Short Term Goal 5 (Week 3): Pt to ambulate 25' with LRAD and Ax2persons  Skilled Therapeutic Interventions/Progress Updates:   Pt received lying in bed, agreeable to therapy session.  Pt and wife remarking on pts ill behavior during am session and issue with having breakfast arrive late today.  Discussed importance of participation in therapy, even if only to sit at EOB to eat to prevent aspiration.  Pt verbalized understanding.  Assisted pt into SL>sitting at max A level.  Continued step by step cues on bringing R arm across body, kicking LEs out of bed and using LUE to elevate trunk into sitting.  Once at EOB, RT assisting with donning socks and shoes prior to transfer.  During this, PT providing assist posteriorly to pt for lumbar, thoracic and cervical stretching with cues for pt to "look at ceiling" for increased activation.  Pt transferred bed>w/c and w/c<>therapy mat and mod A level to the R, and max A level to the L due to continue pushing and extension patterns.  Continues to require max verbal cues and facilitation for forward weight shift and trunk lean during transfer.  Assisted into L SL on therapy mat with pillows under trunk to facilitation R trunk opening/stretching and R trap/levator stretch.  Pt tolerated well with slight over pressure at head.  Note he continues to feel nauseated during session that would intermittently require  seated rest breaks.  Returned to sitting with mod A, better recall of technique.  Remainder of session focused on gait training in hallway with use of hemi walker and R AFO for DF assist.  Pt requires max A to stand and mod/max A for gait (marked improvement from yesterday) however was still very limited by nausea and upset stomach.  Provided assist for advancement of RLE, however he was able to initiate each time.  Also provided assist at pelvis for hip extension and at chest for upright posture.  Performed 15' x 1 and another 10' x 1.  Pt assisted back to room and left in w/c with half lap tray donned and quick release belt donned.  All needs in reach.  Discussed nausea with RN who states that they have D/C'd one of his meds earlier that may have been causing nausea.  Will continue to assess if this helps.    Therapy Documentation Precautions:  Precautions Precautions: Fall Precaution Comments: R hemiplegia, R gaze preference Restrictions Weight Bearing Restrictions: No  Pain: Pain Assessment Pain Assessment: No/denies pain Pain Score: 0-No pain   Locomotion : Ambulation Ambulation/Gait Assistance: 2: Max assist;1: +2 Total assist   See FIM for current functional status  Therapy/Group: Individual Therapy and co-treat with RT  [name] 07/24/2014, 1:03 PM

## 2014-07-25 ENCOUNTER — Inpatient Hospital Stay (HOSPITAL_COMMUNITY): Payer: Medicare Other | Admitting: Physical Therapy

## 2014-07-25 ENCOUNTER — Inpatient Hospital Stay (HOSPITAL_COMMUNITY): Payer: Medicare Other | Admitting: Occupational Therapy

## 2014-07-25 ENCOUNTER — Inpatient Hospital Stay (HOSPITAL_COMMUNITY): Payer: Medicare Other | Admitting: Speech Pathology

## 2014-07-25 MED ORDER — PANTOPRAZOLE SODIUM 40 MG PO TBEC
40.0000 mg | DELAYED_RELEASE_TABLET | Freq: Every day | ORAL | Status: DC
  Administered 2014-07-25 – 2014-08-01 (×8): 40 mg via ORAL
  Filled 2014-07-25 (×8): qty 1

## 2014-07-25 NOTE — Progress Notes (Signed)
Social Work Patient ID: Lorrene Reid, male   DOB: 09-Jul-1926, 80 y.o.   MRN: [id number]  Lennart Pall, LCSW Social Worker Signed  Patient Care Conference 07/25/2014  2:50 PM    Expand All Collapse All   Inpatient RehabilitationTeam Conference and Plan of Care Update Date: 07/25/2014   Time: 9:55 AM     Patient Name: [name]       Medical Record Number: [id number]  Date of Birth: 1926-06-01 Sex: Male         Room/Bed: 4W18C/4W18C-01 Payor Info: Payor: MEDICARE / Plan: MEDICARE PART A AND B / Product Type: *No Product type* /    Admitting Diagnosis: CVA  Admit Date/Time:  07/02/2014  5:47 PM Admission Comments: No comment available   Primary Diagnosis:  Left pontine stroke Principal Problem: Left pontine stroke    Patient Active Problem List     Diagnosis  Date Noted   .  Right hemiparesis  07/05/2014   .  Dysphagia, post-stroke  07/05/2014   .  Dysarthria due to recent cerebral infarction  07/05/2014   .  Left pontine stroke  07/02/2014   .  Embolic stroke involving left carotid artery  06/28/2014   .  Stroke  06/28/2014   .  CAP (community acquired pneumonia)  06/28/2014   .  Normocytic anemia  06/28/2014   .  Essential hypertension  06/28/2014   .  Hypokalemia  06/28/2014     Expected Discharge Date: Expected Discharge Date:  (likely change in d/c plan to SNF vs extension)  Team Members Present: Physician leading conference: Dr. Alger Simons Social Worker Present: Lennart Pall, LCSW Nurse Present: Elliot Cousin, RN PT Present: Raylene Everts, PT;Rebecca Varner, PT;Emily Rinaldo Cloud, PT OT Present: Clyda Greener, Rhetta Mura, OT SLP Present: Windell Moulding, Westley Foots, SLP PPS Coordinator present : Daiva Nakayama, RN, CRRN        Current Status/Progress  Goal  Weekly Team Focus   Medical     tone issues right side, nutrition better   maximize medical mgt to promote functional gains   tone mgt, optimize nutrition   Bowel/Bladder     incontinent of bowel &  bladder, toileting q3h and prn, position hold remove urinal, total assist for hygiene with BM  continent of bowel and bladder with mod assist   timed toileting q3h & prn   Swallow/Nutrition/ Hydration     Dys.3 textures and nectar-thick liquids   Min assist with least restrictive diet   continue trials of upgraded textures and carryover of water protocol    ADL's     Mod - Max A supine <> sit, Mod A bathing ( sink or shower), Max A onto shower chair, Min A grooming, UB dressing Min A, Total A for LB dressing secondary to second helper required for balance and clothing management.  downgraded to overall Mod A  R NMR, functional transfers, pt/family education, positioning, safety awareness, attention to the R, discharge planning    Mobility     mod/max A bed mobility, mod A transfers to the R, max to the L, mod/max for standing, max A dynamic standing, max to +2 for gait with hemi walker.    downgraded to overall mod A  R NMR, attention R, w/c mobility, bed mobility, transfers, gait, pt/family education, D/C planning.    Communication     Min assist   Min assist   continue to adress carryover of strategies    Safety/Cognition/ Behavioral Observations  Min assist   supervision   continue to increase recall and carryover of basic daily information    Pain     low back  pain, K pad in use    3 or less on scale of 0-10  Assess pain q shift and prn   Skin     vasaline fo top of head (healing  lesion removal site) redness to buttocks ECP in use  no new skin breakdown/injury  assess skin q shift and prn      *See Care Plan and progress notes for long and short-term goals.    Barriers to Discharge:  poor memory and awareness     Possible Resolutions to Barriers:   supervision at home     Discharge Planning/Teaching Needs:   Family here daily and attending therapies with pt, want to take home but unsure how realistic it is, may pursue short term NHP       Team Discussion:    Medically ok;   Still with some incontinence.  Improved eating but now eating too much an causing n/v - MD to adjust megace and other meds as this problem is affecting tx sessions.  Still mod - max assist for tfs.  Pushing to right.  He self distracts with his own talking throughout sessions.  SW to follow up with family about SNF option and anticipate a change in the d/c plan.   Revisions to Treatment Plan:    PT goals downgraded.  Likely change in d/c plan    Continued Need for Acute Rehabilitation Level of Care: The patient requires daily medical management by a physician with specialized training in physical medicine and rehabilitation for the following conditions: Daily direction of a multidisciplinary physical rehabilitation program to ensure safe treatment while eliciting the highest outcome that is of practical value to the patient.: Yes Daily medical management of patient stability for increased activity during participation in an intensive rehabilitation regime.: Yes Daily analysis of laboratory values and/or radiology reports with any subsequent need for medication adjustment of medical intervention for : Neurological problems;Other  Marifer Hurd 07/25/2014, 4:15 PM

## 2014-07-25 NOTE — Progress Notes (Signed)
Physical Therapy Weekly Progress Note  Patient Details  Name: [name] MRN: [id number] Date of Birth: 04/30/1926  Beginning of progress report period: July 17, 2014 End of progress report period: July 25, 2014   Patient has met 3 of 5 short term goals.  Pt continues to make slow but steady progress with all aspects of mobility.  Continues to be greatly limited by perceptual deficits and pusher tendencies.  Also limited by decreased memory, attention and R hemiplegia.  Requires mod/max A for bed mobility, esp when going towards the L.  Also requires max A for transfers to the L, mod A to the R.  Have initiated gait training with hemi walker to encourage weight shifts to the L, and WB through RLE for more automatic tasks.  Feel that pt will still need great deal of care at home and may need to change recommendation to SNF.  Downgraded goals to overall mod A with max A gait in therapy only goals.    Patient continues to demonstrate the following deficits: decreased balance, decreased functional strength in RUE/LE, decreased memory, perceptual deficits and therefore will continue to benefit from skilled PT intervention to enhance overall performance with activity tolerance, balance, postural control, ability to compensate for deficits, functional use of  right upper extremity and right lower extremity, attention, awareness, coordination and knowledge of precautions.  Patient not progressing toward long term goals.  See goal revision..  Plan of care revisions: downgraded goals to overall mod A.  PT Short Term Goals Week 3:  PT Short Term Goal 1 (Week 3): Pt to perform bed mobility with mod Ax1person PT Short Term Goal 1 - Progress (Week 3): Progressing toward goal PT Short Term Goal 2 (Week 3): Pt to perform bed<>w/c transfer with min Ax1person to the R and mod A to the L PT Short Term Goal 2 - Progress (Week 3): Progressing toward goal PT Short Term Goal 3 (Week 3): Pt to maintain  dynamic sitting balance with min Ax1person PT Short Term Goal 3 - Progress (Week 3): Met PT Short Term Goal 4 (Week 3): Pt to propel w/c 40' with mod Ax1person PT Short Term Goal 4 - Progress (Week 3): Met PT Short Term Goal 5 (Week 3): Pt to ambulate 25' with LRAD and Ax2persons PT Short Term Goal 5 - Progress (Week 3): Met Week 4:  PT Short Term Goal 1 (Week 4): =LTG's (downgraded due to lack of progress and probable D/C SNF)  Skilled Therapeutic Interventions/Progress Updates:   See previous notes  Therapy Documentation Precautions:  Precautions Precautions: Fall Precaution Comments: R hemiplegia, R gaze preference Restrictions Weight Bearing Restrictions: No  [name] 07/25/2014, 8:00 AM

## 2014-07-25 NOTE — Plan of Care (Signed)
Problem: RH Ambulation Goal: LTG Patient will ambulate in home environment (PT) LTG: Patient will ambulate in home environment, # of feet with assistance (PT).  Outcome: Not Applicable Date Met:  36/85/99 Pt will not be ambulatory at D/C  Problem: RH Stairs Goal: LTG Patient will ambulate up and down stairs w/assist (PT) LTG: Patient will ambulate up and down # of stairs with assistance (PT)  Outcome: Not Applicable Date Met:  23/41/44 Goal D/C'd due to lack of progress, will need to set goal for ramp or bumping in w/c.

## 2014-07-25 NOTE — Progress Notes (Signed)
Difficult IV stick, no IV in place until 0530. Fluids did not infuse. Encouraged fluids with med pass. New IV in place now. Kennieth Francois, RN

## 2014-07-25 NOTE — Progress Notes (Signed)
Speech Language Pathology Weekly Progress and Session Note  Patient Details  Name: [name] MRN: [id number] Date of Birth: [birth year]  Beginning of progress report period: July 17, 2014 End of progress report period: July 25, 2014  Today's Date: 07/25/2014 SLP Individual Time: 0830-0930 SLP Individual Time Calculation (min): 60 min  Short Term Goals: Week 3: SLP Short Term Goal 1 (Week 3): Patient will consume Dys.3 textures and nectar-thick liquids via cup with Supervision verbal cues to utilize safe swallow strategies to minimize overt s/s of aspiration. SLP Short Term Goal 1 - Progress (Week 3): Progressing toward goal SLP Short Term Goal 2 (Week 3): Patient will demonstrate readiness for repeat objective swallow assessment with improved managment of oral secreations with no more than 5 cues needed to manage secreations in a 30 minute session. SLP Short Term Goal 2 - Progress (Week 3): Met SLP Short Term Goal 3 (Week 3): Patient will produce intelligible speech at the phrase level with Min multimodal cues to utilize compensatory strategies. SLP Short Term Goal 3 - Progress (Week 3): Met SLP Short Term Goal 4 (Week 3): Patient will attend to right upper extremity during basic functional tasks with Mod question cues. SLP Short Term Goal 4 - Progress (Week 3): Progressing toward goal    New Short Term Goals: Week 4: SLP Short Term Goal 1 (Week 4): Patient will consume Dys.3 textures and nectar-thick liquids via cup with Supervision verbal cues to utilize safe swallow strategies to minimize overt s/s of aspiration. SLP Short Term Goal 2 (Week 4): Patient will produce intelligible speech at the phrase level with Supervision multimodal cues to utilize compensatory strategies. SLP Short Term Goal 3 (Week 4): Patient will recall water protocol procedures and demonstrate safe swallow precautions with trials of water with Min verbal cues SLP Short Term Goal 4 (Week 4): Patient will  attend to right upper extremity during basic functional tasks with Mod question cues.  Weekly Progress Updates: Patient has made functional gains and has met 2 out of 4 short term goals this reporting period due to improved management of secretions, participation in a repeat an objective swallow assessment and overall speech intelligibility at the phrase level.  Patient continues to require overall Min assist to use safe swallow strategies and Mod assist to attend to right upper extremity during basic self-care tasks.  Currently, patient requires Mod assist for recall of water protocol procedures and use of safe swallow strategies with thin liquids.  Patient and family education is ongoing.  Patient would benefit from continued skilled SLP intervention to maximize overall functional independence in order to reduce burden of care prior to discharge home with wife and hired caregiver.   Intensity: Minumum of 1-2 x/day, 30 to 90 minutes Frequency: 5 out of 7 days Duration/Length of Stay: 1/4 Treatment/Interventions: Cognitive remediation/compensation;Cueing hierarchy;Dysphagia/aspiration precaution training;Environmental controls;Functional tasks;Internal/external aids;Oral motor exercises;Patient/family education;Speech/Language facilitation;Therapeutic Activities   Daily Session  Skilled Therapeutic Interventions: Skilled treatment session focused on addressing education regarding water protocol with wife and patient requiring Supervision cues to recall procedures.  SLP left handout to assist with recall of procedures.  Patient also required Mod verbal and tactile cues to utilize new safe swallow strategies for thin liquids (small straw sip to facilitate head down posture with 2 swallows and intermittent throat clear).  Patient with overt s/s throughout today which SLP suspects was related to copious secretions as well as intermittent burping with wet vocal quality which led to eventual regurgitation  x2.  RN  and MD made aware.    FIM:  Comprehension Comprehension Mode: Auditory Comprehension: 5-Follows basic conversation/direction: With extra time/assistive device Expression Expression Mode: Verbal Expression: 4-Expresses basic 75 - 89% of the time/requires cueing 10 - 24% of the time. Needs helper to occlude trach/needs to repeat words. Social Interaction Social Interaction: 5-Interacts appropriately 90% of the time - Needs monitoring or encouragement for participation or interaction. Problem Solving Problem Solving: 4-Solves basic 75 - 89% of the time/requires cueing 10 - 24% of the time Memory Memory: 4-Recognizes or recalls 75 - 89% of the time/requires cueing 10 - 24% of the time FIM - Eating Eating Activity: 5: Needs verbal cues/supervision General    Pain Pain Assessment Pain Assessment: 0-10 Pain Score: 0-No pain  Therapy/Group: Individual Therapy  Carmelia Roller., CCC-SLP [phone]  [place] 07/25/2014, 10:46 AM

## 2014-07-25 NOTE — Patient Care Conference (Signed)
Inpatient RehabilitationTeam Conference and Plan of Care Update Date: 07/25/2014   Time: 9:55 AM    Patient Name: [name]      Medical Record Number: [id number]  Date of Birth: Jul 20, 1926 Sex: Male         Room/Bed: 4W18C/4W18C-01 Payor Info: Payor: MEDICARE / Plan: MEDICARE PART A AND B / Product Type: *No Product type* /    Admitting Diagnosis: CVA  Admit Date/Time:  07/02/2014  5:47 PM Admission Comments: No comment available   Primary Diagnosis:  Left pontine stroke Principal Problem: Left pontine stroke  Patient Active Problem List   Diagnosis Date Noted  . Right hemiparesis 07/05/2014  . Dysphagia, post-stroke 07/05/2014  . Dysarthria due to recent cerebral infarction 07/05/2014  . Left pontine stroke 07/02/2014  . Embolic stroke involving left carotid artery 06/28/2014  . Stroke 06/28/2014  . CAP (community acquired pneumonia) 06/28/2014  . Normocytic anemia 06/28/2014  . Essential hypertension 06/28/2014  . Hypokalemia 06/28/2014    Expected Discharge Date: Expected Discharge Date:  (likely change in d/c plan to SNF vs extension)  Team Members Present: Physician leading conference: Dr. Alger Simons Social Worker Present: Lennart Pall, LCSW Nurse Present: Elliot Cousin, RN PT Present: Raylene Everts, PT;Rebecca Varner, PT;Emily Rinaldo Cloud, PT OT Present: Clyda Greener, Rhetta Mura, OT SLP Present: Windell Moulding, Westley Foots, SLP PPS Coordinator present : Daiva Nakayama, RN, CRRN     Current Status/Progress Goal Weekly Team Focus  Medical   tone issues right side, nutrition better  maximize medical mgt to promote functional gains  tone mgt, optimize nutrition   Bowel/Bladder   incontinent of bowel & bladder, toileting q3h and prn, position hold remove urinal, total assist for hygiene with BM  continent of bowel and bladder with mod assist   timed toileting q3h & prn   Swallow/Nutrition/ Hydration   Dys.3 textures and nectar-thick liquids   Min assist with  least restrictive diet  continue trials of upgraded textures and carryover of water protocol    ADL's   Mod - Max A supine <> sit, Mod A bathing ( sink or shower), Max A onto shower chair, Min A grooming, UB dressing Min A, Total A for LB dressing secondary to second helper required for balance and clothing management.  downgraded to overall Mod A  R NMR, functional transfers, pt/family education, positioning, safety awareness, attention to the R, discharge planning   Mobility   mod/max A bed mobility, mod A transfers to the R, max to the L, mod/max for standing, max A dynamic standing, max to +2 for gait with hemi walker.    downgraded to overall mod A  R NMR, attention R, w/c mobility, bed mobility, transfers, gait, pt/family education, D/C planning.    Communication   Min assist   Min assist   continue to adress carryover of strategies    Safety/Cognition/ Behavioral Observations  Min assist   supervision   continue to increase recall and carryover of basic daily information    Pain   low back  pain, K pad in use   3 or less on scale of 0-10  Assess pain q shift and prn   Skin   vasaline fo top of head (healing  lesion removal site) redness to buttocks ECP in use  no new skin breakdown/injury  assess skin q shift and prn      *See Care Plan and progress notes for long and short-term goals.  Barriers to Discharge: poor memory and awareness  Possible Resolutions to Barriers:  supervision at home    Discharge Planning/Teaching Needs:  Family here daily and attending therapies with pt, want to take home but unsure how realistic it is, may pursue short term NHP      Team Discussion:  Medically ok;  Still with some incontinence.  Improved eating but now eating too much an causing n/v - MD to adjust megace and other meds as this problem is affecting tx sessions.  Still mod - max assist for tfs.  Pushing to right.  He self distracts with his own talking throughout sessions.  SW to follow  up with family about SNF option and anticipate a change in the d/c plan.  Revisions to Treatment Plan:  PT goals downgraded.  Likely change in d/c plan   Continued Need for Acute Rehabilitation Level of Care: The patient requires daily medical management by a physician with specialized training in physical medicine and rehabilitation for the following conditions: Daily direction of a multidisciplinary physical rehabilitation program to ensure safe treatment while eliciting the highest outcome that is of practical value to the patient.: Yes Daily medical management of patient stability for increased activity during participation in an intensive rehabilitation regime.: Yes Daily analysis of laboratory values and/or radiology reports with any subsequent need for medication adjustment of medical intervention for : Neurological problems;Other  Merrisa Skorupski 07/25/2014, 4:15 PM

## 2014-07-25 NOTE — Progress Notes (Signed)
Physical Therapy Session Note  Patient Details  Name: [name] MRN: [id number] Date of Birth: 10/28/1925  Today's Date: 07/25/2014 PT Co-Treatment Time: 5462 (60 minute cotreat with KP)-1525 PT Co-Treatment Time Calculation (min): 30 min  Short Term Goals: Week 2:  PT Short Term Goal 1 (Week 2): Pt to perform bed mobility with mod Ax1person PT Short Term Goal 1 - Progress (Week 2): Progressing toward goal PT Short Term Goal 2 (Week 2): Pt to perform bed<>w/c transfer with mod Ax1person PT Short Term Goal 2 - Progress (Week 2): Met PT Short Term Goal 3 (Week 2): Pt to maintain dynamic sitting balance with min Ax1person PT Short Term Goal 3 - Progress (Week 2): Progressing toward goal PT Short Term Goal 4 (Week 2): Pt to propel w/c 33' with mod Ax1person PT Short Term Goal 4 - Progress (Week 2): Progressing toward goal PT Short Term Goal 5 (Week 2): Pt to ambulate 25' with LRAD and Ax2persons PT Short Term Goal 5 - Progress (Week 2): Met Week 3:  PT Short Term Goal 1 (Week 3): Pt to perform bed mobility with mod Ax1person PT Short Term Goal 1 - Progress (Week 3): Progressing toward goal PT Short Term Goal 2 (Week 3): Pt to perform bed<>w/c transfer with min Ax1person to the R and mod A to the L PT Short Term Goal 2 - Progress (Week 3): Progressing toward goal PT Short Term Goal 3 (Week 3): Pt to maintain dynamic sitting balance with min Ax1person PT Short Term Goal 3 - Progress (Week 3): Met PT Short Term Goal 4 (Week 3): Pt to propel w/c 59' with mod Ax1person PT Short Term Goal 4 - Progress (Week 3): Met PT Short Term Goal 5 (Week 3): Pt to ambulate 25' with LRAD and Ax2persons PT Short Term Goal 5 - Progress (Week 3): Met  Skilled Therapeutic Interventions/Progress Updates:   Pt participated in skilled co-treat with OT with focus on functional transfers, NMR and stretching to improve positioning and postural control.  Pt performed transfers w/c <> ma squat pivot with max A.   While reclined on wedge performed stretching of bilat hip flexors, hamstrings, gastrocs, upper trunk and UE for increased upper thoracic extension and pelvic anterior rotation; transitioned to sitting edge of mat with R hip wedged for increased shortening of R trunk during continued stretching with sheet around lumbar spine with therapist facilitating increased lumbar lordosis and anterior pelvic tilt during forward reaching to translate COG over BOS during scooting, squat pivots and sit > stand.  Pt reporting need to have BM; returned to room and performed stand pivot transfers w/c <> BSC as well as prolonged standing with UE support around therapist and on hemi walker with max A for transfer and to maintain standing during hygiene and clothing management with verbal, visual and tactile cues to minimize pushing through LUE and LLE and to facilitate increased weight shift to L.  Pt fatigued but tolerated well.    Therapy Documentation Precautions:  Precautions Precautions: Fall Precaution Comments: R hemiplegia, R gaze preference Restrictions Weight Bearing Restrictions: No Pain: Pain Assessment Pain Assessment: No/denies pain Other Treatments: Treatments Neuromuscular Facilitation: Right;Left;Upper Extremity;Lower Extremity;Forced use;Activity to increase coordination;Activity to increase motor control;Activity to increase timing and sequencing;Activity to increase sustained activation;Activity to increase grading;Activity to increase lateral weight shifting;Activity to increase anterior-posterior weight shifting  See FIM for current functional status  Therapy/Group: Co-Treatment  [name] 07/25/2014, 4:16 PM

## 2014-07-25 NOTE — Progress Notes (Signed)
Subjective/Complaints:  Had a pretty good night. Denies pain. No sob, cough, cp, n/v  Review of Systems - Negative except weakness on right side Objective: Vital Signs: Blood pressure 98/64, pulse 91, temperature 97.8 F (36.6 C), temperature source Oral, resp. rate 17, height 5\' 9"  (1.753 m), weight 60.2 kg (132 lb 11.5 oz), SpO2 99 %. Dg Swallowing Func-speech Pathology  07/24/2014   Mora Appl, CCC-SLP     07/24/2014  7:25 PM   Objective Swallowing Evaluation: Modified Barium Swallowing Study   Patient Details  Name: [name] MRN: [id number] Date of Birth: [birth year]  Today's Date: 07/24/2014 Time: 0900-0930 30 minutes   Past Medical History:  Past Medical History  Diagnosis Date  . Hypertension   . Cancer    Past Surgical History:  Past Surgical History  Procedure Laterality Date  . Appendectomy    . Kidney surgery     HPI:  [name] is a 80 y.o. male with history of hypertension  and prostate cancer. He had a productive cough for few days prior  to admission. Patient had gone to his PCP on day of admission and  was prescribed antibiotics for possible pneumonia. Later  patient's family called EMS as they found that patient was having  right facial droop with right-sided hemiplegia and was brought to  the ER. MRI shows an acute nonhemorrhagic left paramedian pontine  infarct. Patient admitted to inpatient rehabilitation and has  been participatign in therapies with increased ability to manage  secreations with intermittent s/s of aspiration with thin liquids  as a result, repeat objective assessment warranted.       Recommendation/Prognosis  Clinical Impression:   Dysphagia Diagnosis: Mild oral phase dysphagia;Moderate  pharyngeal phase dysphagia Clinical impression: Patient demonstrates a mild oral and  moderate pharyngeal sensorimotor dysphagia; greatest improvements  in oral phase as compared to previous objective assessment.    Oral phase is marked by decreased mastication of  regular textures  and incomplete tongue to palate contact resulting in trace oral  residue of all consistencies. Patient also demonstrates slight  premature spillage to the valleculae, which leads to intermittent  silent penetration and aspiration of thin liquids.  Post swallow  residues are observed throughout the pharynx and cleared with a  cued second swallow.  Use of head down posture with a small straw  sip, throat clear and second swallow with thin liquids was  effective at protecting airway; however patient required Max cues  to consistently execute this.  Recommend continuation of current  diet order with meds administered whole in puree and thin liquid  trials addressed in ongoing skilled therapy sessions.     Swallow Evaluation Recommendations:  Diet Recommendations: Dysphagia 3 (Mechanical Soft);Nectar-thick  liquid Liquid Administration via: Cup;Straw Medication Administration: Whole meds with puree Supervision: Patient able to self feed;Full supervision/cueing  for compensatory strategies Compensations: Slow rate;Small sips/bites;Multiple dry swallows  after each bite/sip;Check for pocketing;Check for anterior loss Postural Changes and/or Swallow Maneuvers: Seated upright 90  degrees;Upright 30-60 min after meal Oral Care Recommendations: Oral care BID;Oral care before and  after PO Other Recommendations: Order thickener from pharmacy;Prohibited  food (jello, ice cream, thin soups);Remove water pitcher;Have  oral suction available Follow up Recommendations: 24 hour supervision/assistance;Home  health SLP    Prognosis:  Prognosis for Safe Diet Advancement: Good Barriers to Reach Goals: Cognitive deficits   Individuals Consulted: Consulted and Agree with Results and  Recommendations: Patient;Family member/caregiver Family Member Consulted: wife, daughter  SLP Assessment/Plan  Plan:  See care plan for details   Short Term Goals: Week 3: SLP Short Term Goal 1 (Week 3): Patient  will consume Dys.3  textures and nectar-thick liquids via cup with  Supervision verbal cues to utilize safe swallow strategies to  minimize overt s/s of aspiration. SLP Short Term Goal 2 (Week 3): Patient will demonstrate  readiness for repeat objective swallow assessment with improved  managment of oral secreations with no more than 5 cues needed to  manage secreations in a 30 minute session. SLP Short Term Goal 3 (Week 3): Patient will produce intelligible  speech at the phrase level with Min multimodal cues to utilize  compensatory strategies. SLP Short Term Goal 4 (Week 3): Patient will attend to right  upper extremity during basic functional tasks with Mod question  cues.    General: Date of Onset: 06/27/14 Type of Study: Modified Barium Swallowing Study Reason for Referral: Objectively evaluate swallowing function Previous Swallow Assessment: MBS 12/4 Dys.2 with nectar-thick  liquids  Diet Prior to this Study: Dysphagia 3 (soft);Nectar-thick liquids Temperature Spikes Noted: No Respiratory Status: Room air History of Recent Intubation: No Behavior/Cognition: Alert;Cooperative;Pleasant mood;Requires  cueing Oral Cavity - Dentition: Missing dentition Oral Motor / Sensory Function: Impaired - see Bedside swallow  eval Self-Feeding Abilities: Able to feed self;Needs assist;Needs set  up Patient Positioning: Upright in chair Baseline Vocal Quality: Low vocal intensity Volitional Cough: Weak Volitional Swallow: Able to elicit Anatomy: Within functional limits Pharyngeal Secretions: Not observed secondary MBS   Reason for Referral:   Objectively evaluate swallowing function    Oral Phase: Oral Preparation/Oral Phase Oral Phase: Impaired Oral - Nectar Oral - Nectar Teaspoon: Not tested Oral - Nectar Cup: Within functional limits Oral - Nectar Straw: Within functional limits Oral - Thin Oral - Thin Cup: Right anterior bolus loss;Lingual/palatal  residue Oral - Thin Straw: Right anterior bolus loss Oral - Solids Oral - Puree: Within  functional limits Oral - Mechanical Soft: Not tested Oral - Regular: Impaired mastication   Pharyngeal Phase:  Pharyngeal Phase Pharyngeal Phase: Impaired Pharyngeal - Nectar Pharyngeal - Nectar Cup: Reduced anterior laryngeal  mobility;Reduced laryngeal elevation;Reduced tongue base  retraction;Pharyngeal residue - valleculae Penetration/Aspiration details (nectar cup): Material does not  enter airway Pharyngeal - Nectar Straw: Not tested Pharyngeal - Thin Pharyngeal - Thin Cup: Delayed swallow initiation;Reduced  anterior laryngeal mobility;Reduced laryngeal elevation;Reduced  tongue base retraction;Penetration/Aspiration during  swallow;Pharyngeal residue - valleculae;Pharyngeal residue -  posterior pharnyx;Premature spillage to valleculae Penetration/Aspiration details (thin cup): Material enters  airway, remains ABOVE vocal cords and not ejected out;Material  enters airway, passes BELOW cords without attempt by patient to  eject out (silent aspiration) Pharyngeal - Thin Straw: Delayed swallow initiation;Premature  spillage to valleculae;Reduced anterior laryngeal  mobility;Reduced laryngeal elevation;Reduced tongue base  retraction;Penetration/Aspiration during swallow;Pharyngeal  residue - valleculae;Pharyngeal residue - posterior  pharnyx;Compensatory strategies attempted (Comment) (cued throat  clear and second swallow effective) Penetration/Aspiration details (thin straw): Material enters  airway, remains ABOVE vocal cords and not ejected out Pharyngeal - Solids Pharyngeal - Puree: Reduced anterior laryngeal mobility;Reduced  laryngeal elevation;Reduced tongue base retraction;Pharyngeal  residue - valleculae Penetration/Aspiration details (puree): Material does not enter  airway Pharyngeal - Mechanical Soft: Not tested Pharyngeal - Regular: Pharyngeal residue - valleculae;Reduced  tongue base retraction;Reduced laryngeal elevation;Reduced  anterior laryngeal mobility   Cervical Esophageal Phase  Cervical  Esophageal Phase Cervical Esophageal Phase: WFL   GN        [name], M.A., CCC-SLP [phone]  [name],[name] 07/24/2014, 7:23 PM                    No results found for this or any previous visit (from the past 72 hour(s)).   HEENT: normal, no evidence of thrush Cardio: RRR Resp: CTA B/L, no resp distress GI: BS positive and NT,ND Extremity:  Edema Right hand and Right foot Skin:   Wound vertex of scalp, eschar Neuro: Lethargic, Cranial Nerve Abnormalities Right central 7, Abnormal Sensory reduced on left side to LT, Abnormal Motor 0/5 on right UE except trace at finger and wrist flexors  2-Right foot plantar flexion and Tone:  Emerging tone RLE---1-2/4. DTR's 3+ Left side 5/5 in UE and LE Musc/Skel:  Other no pain with Right shoulder ROM. Right heel cord tight Gen NAD   Assessment/Plan: 1. Functional deficits secondary to left paramedian pontine infarct Right hemiparesis which require 3+ hours per day of interdisciplinary therapy in a comprehensive inpatient rehab setting. Physiatrist is providing close team supervision and 24 hour management of active medical problems listed below. Physiatrist and rehab team continue to assess barriers to discharge/monitor patient progress toward functional and medical goals.  Marland KitchenFIM: FIM - Bathing Bathing Steps Patient Completed: Chest, Right Arm, Left Arm, Front perineal area, Right upper leg, Left upper leg Bathing: 3: Mod-Patient completes 5-7 4f 10 parts or 50-74%  FIM - Upper Body Dressing/Undressing Upper body dressing/undressing steps patient completed: Thread/unthread right sleeve of pullover shirt/dresss, Put head through opening of pull over shirt/dress Upper body dressing/undressing: 2: Max-Patient completed 25-49% of tasks FIM - Lower Body Dressing/Undressing Lower body dressing/undressing steps patient completed: Thread/unthread left pants leg Lower body dressing/undressing: 1: Two helpers  FIM - Toileting Toileting: 1:  Total-Patient completed zero steps, helper did all 3  FIM - Radio producer Devices: Grab bars Toilet Transfers: 2-To toilet/BSC: Max A (lift and lower assist), 2-From toilet/BSC: Max A (lift and lower assist)  FIM - Control and instrumentation engineer Devices: Bed rails Bed/Chair Transfer: 2: Supine > Sit: Max A (lifting assist/Pt. 25-49%), 2: Bed > Chair or W/C: Max A (lift and lower assist)  FIM - Locomotion: Wheelchair Distance: 25 Locomotion: Wheelchair: 1: Total Assistance/staff pushes wheelchair (Pt<25%) FIM - Locomotion: Ambulation Locomotion: Ambulation Assistive Devices: Museum/gallery curator Ambulation/Gait Assistance: 2: Max assist, 1: +2 Total assist Locomotion: Ambulation: 1: Two helpers  Comprehension Comprehension Mode: Auditory Comprehension: 5-Follows basic conversation/direction: With extra time/assistive device  Expression Expression Mode: Verbal Expression: 4-Expresses basic 75 - 89% of the time/requires cueing 10 - 24% of the time. Needs helper to occlude trach/needs to repeat words.  Social Interaction Social Interaction: 5-Interacts appropriately 90% of the time - Needs monitoring or encouragement for participation or interaction.  Problem Solving Problem Solving: 4-Solves basic 75 - 89% of the time/requires cueing 10 - 24% of the time  Memory Memory: 4-Recognizes or recalls 75 - 89% of the time/requires cueing 10 - 24% of the time   Medical Problem List and Plan: 1. Functional deficits secondary to left paramedian pontine infarct 2. DVT Prophylaxis/Anticoagulation: Pharmaceutical: Lovenox 3. Pain Management: N/A  -ROM for emerging tone LLE 4. Mood: LCSW to follow for evaluation and support.  5. Neuropsych: This patient is not capable of making decisions on his own behalf. 6. Skin/Wound Care:hx melanoma removal scalp, vaseline  - Routine pressure relief measures.   -Turn patient every 2 hours when in bed.   7. Fluids/Electrolytes/Nutrition: Monitor I/O. Check daily weights to monitor for signs of  overload.  8. Acute renal insufficiency: Improved. Off HCTZ at this time. 10. HTN: Will monitor every 8 hours and allow permissive HTN for adequate perfusion. 11.  Leucocytosis:improved on last CBC will recheck    LOS (Days) 23 A FACE TO FACE EVALUATION WAS PERFORMED  Reid,Michael T 07/25/2014, 8:38 AM

## 2014-07-25 NOTE — Progress Notes (Signed)
Occupational Therapy Session Note  Patient Details  Name: [name] MRN: [id number] Date of Birth: 11-Feb-1926  Today's Date: 07/25/2014 OT Individual Time: 5400-8676 and 1425- 1455 ( co treatment with PT (AH) from 1425-1525) OT Individual Time Calculation (min): 60 min and 30 min co treat with PT   Short Term Goals: Week 3:  OT Short Term Goal 1 (Week 3): Patient's caregiver will demonstrate proper technique of PROM to R UE for stretching.  OT Short Term Goal 2 (Week 3): Pt will perform LB bathing and dressing with Max A of 1 in order to decrease level of assistance for self care.  OT Short Term Goal 3 (Week 3): Pt will perform toilet transfer with Mod A in order to decrease assistance in functional transfers. OT Short Term Goal 4 (Week 3): Pt will perform shower transfer with Mod A in order to decrease assistance in functional transfers.  Skilled Therapeutic Interventions/Progress Updates:  Session 1: Upon entering the room, family members present and stepping out as therapist entered the room. Pt agreeable to shower this session with Max A transfer to R from wheelchair <>wheeled shower chair. Pt began to be incontinent of bowel while seated in shower chair on two separate occassions during B& D session with pt unaware. Pt required Mod A for bathing and total A for hygiene after episodes of incontinence. Pt returning to wheelchair for dressing and drying. Pt continued to require 1 person to stand with Mod- Max A and 2nd helper for clothing management. Therapist assisting with facilitating weight shift while standing. Pt completing UB dressing with 2 attempts and performing with Mod A. Pt seated in wheelchair with QRB and arm tray donned. OT spoke to his wife about upcoming discharge and recommended SNF with rehab for continued OT services. Caregiver reports, "I know he needs to go somewhere. I will talk to the social worker for more information." Wife returning to room with pt and call bell  within reach as therapist exited the room.   Session 2: Upon entering room, pt seated in wheelchair with wife present during entire session. Pt seen for skilled Co treatment with PT and focus on functional transfers, L NMR, weight shifting,postural control, thoracic and cervical stretching while reclined on wedge. Also, while on wedge PT stretched B LEs while OT performed PROM of R UE in all planes. Pt sitting on edge of mat with R hip wedged for shortening of R trunk and sheet placed around lumbar spine for anterior pelvic shift while reaching forward with L UE towards therapist shoulder. Pt reporting bowel incontinence again this session and assisted back to room via wheelchair and total A. Pt performed stand pivot transfer wheelchair <> drop arm commode with hemi walker with R UE around therapist for support with max A for transfer. Pt requiring assistance for hemi walker advancement while transferring as well as facilitation of weight shift as pt began pushing through L UE and L LE. Pt required max cues for weight shifting during prolonged standing with second person assisting with clothing management and hygiene. Pt returned to seated position in wheelchair with QRB and arm tray donned. Wife remains present in room with call bell within reach upon exiting.    Therapy Documentation Precautions:  Precautions Precautions: Fall Precaution Comments: R hemiplegia, R gaze preference Restrictions Weight Bearing Restrictions: No Pain: Pain Assessment Pain Assessment: 0-10 Pain Score: 0-No pain  See FIM for current functional status  Therapy/Group: Individual Therapy and Co-Treatment  [name] 07/25/2014,  12:31 PM

## 2014-07-25 NOTE — Progress Notes (Signed)
Occupational Therapy Weekly Progress Note  Patient Details  Name: [name] MRN: [id number] Date of Birth: 1926-05-01  Beginning of progress report period: July 19, 2014 End of progress report period: July 26, 2014  Today's Date: 07/26/2014 OT Individual Time: 1100-1200 OT Individual Time Calculation (min): 60 min    Patient has met 0 of 4 short term goals. Pt making slow but steady progress towards OT short term and long term goals this week. Pt limitations include decreased recall, attention, hemiplegia, and pushing tendencies. Pt requiring Mod - Max A bobath transfers. He continued to require 2 person assist for tasks such as LB dressing and toileting tasks secondary to dynamic standing balance and functional task. His wife has been educated on PROM for R UE in all planes but education to continue to ensure proper technique. OT has discussed recommendation of discharge to SNF with rehab based on continued continued need for increased assistance. Patient and caregiver are agreeable to this and will be discussing it further with social worked to determine best placement. Long term goals for bathing and shower transfer downgraded to Mod A and toileting goal discharged to Max A.   Patient continues to demonstrate the following deficits: decreased self care, decreased balance, decreased strength R UE/LE, decreased short term recall, decreased postural control, decreased endurance and therefore will continue to benefit from skilled OT intervention to enhance overall performance with BADL.  Patient not progressing toward long term goals.  See goal revision..  Plan of care revisions: downgraded long term bathing, toileting, and shower transfer goal..  OT Short Term Goals Week 3:  OT Short Term Goal 1 (Week 3): Patient's caregiver will demonstrate proper technique of PROM to R UE for stretching.  OT Short Term Goal 1 - Progress (Week 3): Progressing toward goal OT Short Term Goal 2  (Week 3): Pt will perform LB bathing and dressing with Max A of 1 in order to decrease level of assistance for self care.  OT Short Term Goal 2 - Progress (Week 3): Progressing toward goal OT Short Term Goal 3 (Week 3): Pt will perform toilet transfer with Mod A in order to decrease assistance in functional transfers. OT Short Term Goal 3 - Progress (Week 3): Progressing toward goal OT Short Term Goal 4 (Week 3): Pt will perform shower transfer with Mod A in order to decrease assistance in functional transfers. OT Short Term Goal 4 - Progress (Week 3): Progressing toward goal Week 4:  OT Short Term Goal 1 (Week 4): STGs= LTGs secondary to estimated upcoming discharge date  Skilled Therapeutic Interventions/Progress Updates:   Upon entering room, pt seated in wheelchair with no c/o pain. Pt reporting needing to have bowel movement. Pt performing stand pivot from wheelchair to drop arm commode with Max A. Pt able to have bowel movement with assist of second person for clothing management and hygiene while therapist assisted pt with weight shifting and standing with max A. Pt returning to wheelchair to complete remainder of bathing and dressing at sink side. Pt requiring Min- mod verbal guidance cues for initiation and sequencing of tasks. Continued focus on hemiplegic technique especially for UB dressing with pt performing task with Min A. Pt remained seated in wheelchair with QRB and arm tray donned. Call bell within reach and pt awaiting lunch tray.    Therapy Documentation Precautions:  Precautions Precautions: Fall Precaution Comments: R hemiplegia, R gaze preference Restrictions Weight Bearing Restrictions: No Pain: Pain Assessment Pain Assessment: No/denies pain  See  FIM for current functional status  Therapy/Group: Individual Therapy  [name] 07/25/2014, 4:36 PM

## 2014-07-26 ENCOUNTER — Inpatient Hospital Stay (HOSPITAL_COMMUNITY): Payer: Medicare Other | Admitting: Speech Pathology

## 2014-07-26 ENCOUNTER — Inpatient Hospital Stay (HOSPITAL_COMMUNITY): Payer: Medicare Other | Admitting: Occupational Therapy

## 2014-07-26 ENCOUNTER — Inpatient Hospital Stay (HOSPITAL_COMMUNITY): Payer: Medicare Other | Admitting: Physical Therapy

## 2014-07-26 NOTE — Progress Notes (Signed)
Subjective/Complaints:  No new issues. Slept fairly well. Says he's "dialed in on therapy"  Review of Systems - Negative except weakness on right side Objective: Vital Signs: Blood pressure 141/68, pulse 75, temperature 98.4 F (36.9 C), temperature source Oral, resp. rate 18, height 5\' 9"  (1.753 m), weight 62.9 kg (138 lb 10.7 oz), SpO2 97 %. No results found. No results found for this or any previous visit (from the past 72 hour(s)).   HEENT: normal, no evidence of thrush Cardio: RRR Resp: CTA B/L, no resp distress GI: BS positive and NT,ND Extremity:  Edema Right hand and Right foot Skin:   Wound vertex of scalp, eschar Neuro: Lethargic, Cranial Nerve Abnormalities Right central 7, Abnormal Sensory reduced on left side to LT, Abnormal Motor 0/5 on right UE except trace at finger and wrist flexors  2-Right foot plantar flexion and Tone:  Emerging tone RLE---1-2/4. DTR's 3+ Left side 5/5 in UE and LE Musc/Skel:  Other no pain with Right shoulder ROM. Right heel cord tight Gen NAD   Assessment/Plan: 1. Functional deficits secondary to left paramedian pontine infarct Right hemiparesis which require 3+ hours per day of interdisciplinary therapy in a comprehensive inpatient rehab setting. Physiatrist is providing close team supervision and 24 hour management of active medical problems listed below. Physiatrist and rehab team continue to assess barriers to discharge/monitor patient progress toward functional and medical goals.  Marland KitchenFIM: FIM - Bathing Bathing Steps Patient Completed: Chest, Right Arm, Front perineal area, Abdomen, Right upper leg, Left upper leg Bathing: 3: Mod-Patient completes 5-7 11f 10 parts or 50-74%  FIM - Upper Body Dressing/Undressing Upper body dressing/undressing steps patient completed: Thread/unthread right sleeve of pullover shirt/dresss, Put head through opening of pull over shirt/dress Upper body dressing/undressing: 3: Mod-Patient completed 50-74% of  tasks FIM - Lower Body Dressing/Undressing Lower body dressing/undressing steps patient completed: Thread/unthread left pants leg Lower body dressing/undressing: 1: Two helpers  FIM - Toileting Toileting: 1: Total-Patient completed zero steps, helper did all 3  FIM - Radio producer Devices: Engineer, civil (consulting), Insurance account manager Transfers: 2-From toilet/BSC: Max A (lift and lower assist), 2-To toilet/BSC: Max A (lift and lower assist)  FIM - Control and instrumentation engineer Devices: Arm rests Bed/Chair Transfer: 2: Chair or W/C > Bed: Max A (lift and lower assist), 2: Bed > Chair or W/C: Max A (lift and lower assist)  FIM - Locomotion: Wheelchair Distance: 25 Locomotion: Wheelchair: 1: Total Assistance/staff pushes wheelchair (Pt<25%) FIM - Locomotion: Ambulation Locomotion: Ambulation Assistive Devices: Museum/gallery curator Ambulation/Gait Assistance: 2: Max assist, 1: +2 Total assist Locomotion: Ambulation: 0: Activity did not occur  Comprehension Comprehension Mode: Auditory Comprehension: 5-Follows basic conversation/direction: With extra time/assistive device  Expression Expression Mode: Verbal Expression: 4-Expresses basic 75 - 89% of the time/requires cueing 10 - 24% of the time. Needs helper to occlude trach/needs to repeat words.  Social Interaction Social Interaction: 5-Interacts appropriately 90% of the time - Needs monitoring or encouragement for participation or interaction.  Problem Solving Problem Solving: 4-Solves basic 75 - 89% of the time/requires cueing 10 - 24% of the time  Memory Memory: 5-Recognizes or recalls 90% of the time/requires cueing < 10% of the time   Medical Problem List and Plan: 1. Functional deficits secondary to left paramedian pontine infarct 2. DVT Prophylaxis/Anticoagulation: Pharmaceutical: Lovenox 3. Pain Management: N/A  -ROM for emerging tone LLE 4. Mood: LCSW to follow for evaluation and  support.  5. Neuropsych: This patient is not capable of  making decisions on his own behalf. 6. Skin/Wound Care:hx melanoma removal scalp, vaseline  - Routine pressure relief measures.   -Turn patient every 2 hours when in bed.  7. Fluids/Electrolytes/Nutrition: Monitor I/O. Check daily weights to monitor for signs of overload.  8. Acute renal insufficiency: Improved. Off HCTZ at this time. 10. HTN: Will monitor every 8 hours and allow permissive HTN for adequate perfusion. 11.  Leucocytosis: recheck next week   LOS (Days) 24 A FACE TO FACE EVALUATION WAS PERFORMED  SWARTZ,ZACHARY T 07/26/2014, 9:42 AM

## 2014-07-26 NOTE — Progress Notes (Signed)
Social Work Patient ID: [name], male   DOB: [birth year], 80 y.o.   MRN: [id number] Met with pt, wife and daughter to discuss team conference progression toward his goals and if discharge plan home versus short term NHP. All feel the best option is to go to a NH for short time then hopefully home once higher level.  They plan to pursue [facility] and know management there. Wife plans to contact them and inform of pt needing their services.  Will completed FL2 and fax over to their facility.  Await return contact.

## 2014-07-26 NOTE — Progress Notes (Signed)
Physical Therapy Session Note  Patient Details  Name: [name] MRN: [id number] Date of Birth: 18-Sep-1925  Today's Date: 07/26/2014 PT Individual Time: 3361-2244 PT Individual Time Calculation (min): 60 min   Short Term Goals: Week 4:  PT Short Term Goal 1 (Week 4): =LTG's (downgraded due to lack of progress and probable D/C SNF)  Skilled Therapeutic Interventions/Progress Updates:   Pt received semi reclined in bed, RN departing and family present for session. Pt transferred supine > sidelying > sit with HOB raised and max A with mod cues for technique. Sitting balance edge of bed with S-min A while shoes donned with total A. Pt performed squat pivot transfer bed > w/c and w/c <> mat table to L with max A and to R with mod A, increased time and max cues for anterior weight shift and forward trunk lean. With patient supine on wedge, performed BLE stretching to patient tolerance for hip flexors, hamstrings, gastrocs, hip adductors, and iron cross for pectoral/BUE stretch, x 1-2 min each. Pt performed NMR to facilitate anterior weight shift and decrease extensor pattern with transfers and sit <> stand, reaching forward with LUE to grab clothespin from stool, standing with therapist under RUE facilitating neck/trunk/hip/knee extension, then weight shifting forward and to L to place clothespin on vertical rod. Patient requires total cues to complete exercise correctly as he is very focused on placing clothespin before coming to complete stand. Patient also performed standing with weight shifting to L to remove clothespins using LUE with +2 assist for safety. Seated edge of mat with wedge to facilitate anterior pelvic tilt, patient pushed rolling tray table forward and backward with BUE on table (therapist supporting at R elbow) with focus on maintaining upright head and chest and hinging forward at hips. Patient with noted improvement with continued practice. Pt propelled w/c using LUE x 45 ft with mod  A back towards room and left sitting in w/c with family to return to room.   Therapy Documentation Precautions:  Precautions Precautions: Fall Precaution Comments: R hemiplegia, R gaze preference Restrictions Weight Bearing Restrictions: No Pain: Pain Assessment Pain Assessment: No/denies pain  Other Treatments: Treatments Neuromuscular Facilitation: Right;Left;Upper Extremity;Lower Extremity;Forced use;Activity to increase coordination;Activity to increase motor control;Activity to increase timing and sequencing;Activity to increase sustained activation;Activity to increase grading;Activity to increase lateral weight shifting;Activity to increase anterior-posterior weight shifting  See FIM for current functional status  Therapy/Group: Individual Therapy  Laretta Alstrom 07/26/2014, 10:42 AM

## 2014-07-26 NOTE — Progress Notes (Signed)
Speech Language Pathology Daily Session Note  Patient Details  Name: [name] MRN: [id number] Date of Birth: Oct 12, 1925  Today's Date: 07/26/2014 SLP Individual Time: 1400-1500 SLP Individual Time Calculation (min): 60 min  Short Term Goals: Week 4: SLP Short Term Goal 1 (Week 4): Patient will consume Dys.3 textures and nectar-thick liquids via cup with Supervision verbal cues to utilize safe swallow strategies to minimize overt s/s of aspiration. SLP Short Term Goal 2 (Week 4): Patient will produce intelligible speech at the phrase level with Supervision multimodal cues to utilize compensatory strategies. SLP Short Term Goal 3 (Week 4): Patient will recall water protocol procedures and demonstrate safe swallow precautions with trials of water with Min verbal cues SLP Short Term Goal 4 (Week 4): Patient will attend to right upper extremity during basic functional tasks with Mod question cues.  Skilled Therapeutic Interventions: Skilled treatment session focused on addressing dysphagia goals.  Patient required Mod verbal cues to recall procedures for Free Water Protocol.  SLP also facilitated session with Max faded to Min multimodal cues for utilization of safe swallow strategies for thin liquids (small straw sip to facilitate head down posture with 2 swallows and intermittent throat clear).  Patient consumed thin liquids via straw with overt s/s in 4 out of about 20 trials often times when SLP attempted to fade cues; SLP cuing patient to not "cheat," appeared to increase accuracy.  Recommend "keeping score"  or data in front of patient during next session to facilitate carryover.         FIM:  Comprehension Comprehension Mode: Auditory Comprehension: 5-Follows basic conversation/direction: With extra time/assistive device Expression Expression Mode: Verbal Expression: 4-Expresses basic 75 - 89% of the time/requires cueing 10 - 24% of the time. Needs helper to occlude trach/needs to  repeat words. Social Interaction Social Interaction: 5-Interacts appropriately 90% of the time - Needs monitoring or encouragement for participation or interaction. Problem Solving Problem Solving: 4-Solves basic 75 - 89% of the time/requires cueing 10 - 24% of the time Memory Memory: 3-Recognizes or recalls 50 - 74% of the time/requires cueing 25 - 49% of the time FIM - Eating Eating Activity: 5: Needs verbal cues/supervision;4: Help with managing cup/glass  Pain Pain Assessment Pain Assessment: No/denies pain  Therapy/Group: Individual Therapy  Carmelia Roller., CCC-SLP [phone]  [place] 07/26/2014, 4:34 PM

## 2014-07-26 NOTE — Plan of Care (Signed)
Problem: RH Bathing Goal: LTG Patient will bathe with assist, cues/equipment (OT) LTG: Patient will bathe specified number of body parts with assist with/without cues using equipment (position) (OT)  Pt making slow progress towards goals  Problem: RH Toileting Goal: LTG Patient will perform toileting w/assist, cues/equip (OT) LTG: Patient will perform toiletiing (clothes management/hygiene) with assist, with/without cues using equipment (OT)  Downgraded secondary to slow progress towards goals  Problem: RH Tub/Shower Transfers Goal: LTG Patient will perform tub/shower transfers w/assist (OT) LTG: Patient will perform tub/shower transfers with assist, with/without cues using equipment (OT)  Downgraded secondary to slow progress towards goals

## 2014-07-27 LAB — CREATININE, SERUM
CREATININE: 0.76 mg/dL (ref 0.50–1.35)
GFR calc non Af Amer: 80 mL/min — ABNORMAL LOW (ref >90)

## 2014-07-27 NOTE — Progress Notes (Signed)
Subjective/Complaints:  No new complaints. "i may have a place to go to." Review of Systems - Negative except weakness on right side Objective: Vital Signs: Blood pressure 136/61, pulse 82, temperature 98.5 F (36.9 C), temperature source Oral, resp. rate 16, height 5\' 9"  (1.753 m), weight 61.8 kg (136 lb 3.9 oz), SpO2 97 %. No results found. No results found for this or any previous visit (from the past 72 hour(s)).   HEENT: normal, no evidence of thrush Cardio: RRR Resp: CTA B/L, no resp distress GI: BS positive and NT,ND Extremity:  Edema Right hand and Right foot Skin:   Wound vertex of scalp, eschar Neuro: Lethargic, Cranial Nerve Abnormalities Right central 7, Abnormal Sensory reduced on left side to LT, Abnormal Motor 0/5 on right UE except trace at finger and wrist flexors  2-Right foot plantar flexion and Tone:  Emerging tone RLE---1-2/4. DTR's 3+ Left side 5/5 in UE and LE Musc/Skel:  Other no pain with Right shoulder ROM. Right heel cord tight Gen NAD   Assessment/Plan: 1. Functional deficits secondary to left paramedian pontine infarct Right hemiparesis which require 3+ hours per day of interdisciplinary therapy in a comprehensive inpatient rehab setting. Physiatrist is providing close team supervision and 24 hour management of active medical problems listed below. Physiatrist and rehab team continue to assess barriers to discharge/monitor patient progress toward functional and medical goals.  Potential SNF placement early this coming week.  Marland KitchenFIM: FIM - Bathing Bathing Steps Patient Completed: Chest, Right Arm, Front perineal area, Right upper leg, Left upper leg, Abdomen Bathing: 3: Mod-Patient completes 5-7 46f 10 parts or 50-74%  FIM - Upper Body Dressing/Undressing Upper body dressing/undressing steps patient completed: Thread/unthread left sleeve of pullover shirt/dress, Put head through opening of pull over shirt/dress, Pull shirt over trunk Upper body  dressing/undressing: 4: Min-Patient completed 75 plus % of tasks FIM - Lower Body Dressing/Undressing Lower body dressing/undressing steps patient completed: Thread/unthread left pants leg Lower body dressing/undressing: 1: Two helpers  FIM - Toileting Toileting: 1: Two helpers  FIM - Radio producer Devices: Recruitment consultant Transfers: 2-From toilet/BSC: Max A (lift and lower assist), 2-To toilet/BSC: Max A (lift and lower assist)  FIM - Control and instrumentation engineer Devices: HOB elevated Bed/Chair Transfer: 2: Chair or W/C > Bed: Max A (lift and lower assist), 2: Bed > Chair or W/C: Max A (lift and lower assist), 3: Supine > Sit: Mod A (lifting assist/Pt. 50-74%/lift 2 legs  FIM - Locomotion: Wheelchair Distance: 45 Locomotion: Wheelchair: 1: Travels less than 50 ft with moderate assistance (Pt: 50 - 74%) FIM - Locomotion: Ambulation Locomotion: Ambulation Assistive Devices: Museum/gallery curator Ambulation/Gait Assistance: 2: Max assist, 1: +2 Total assist Locomotion: Ambulation: 0: Activity did not occur  Comprehension Comprehension Mode: Auditory Comprehension: 5-Follows basic conversation/direction: With extra time/assistive device  Expression Expression Mode: Verbal Expression: 4-Expresses basic 75 - 89% of the time/requires cueing 10 - 24% of the time. Needs helper to occlude trach/needs to repeat words.  Social Interaction Social Interaction: 5-Interacts appropriately 90% of the time - Needs monitoring or encouragement for participation or interaction.  Problem Solving Problem Solving: 4-Solves basic 75 - 89% of the time/requires cueing 10 - 24% of the time  Memory Memory: 5-Recognizes or recalls 90% of the time/requires cueing < 10% of the time   Medical Problem List and Plan: 1. Functional deficits secondary to left paramedian pontine infarct 2. DVT Prophylaxis/Anticoagulation: Pharmaceutical: Lovenox 3. Pain  Management: N/A  -  ROM for emerging tone LLE, patient ed 4. Mood: LCSW to follow for evaluation and support.  5. Neuropsych: This patient is not capable of making decisions on his own behalf. 6. Skin/Wound Care:hx melanoma removal scalp, vaseline  - Routine pressure relief measures.   -Turn patient every 2 hours when in bed.  7. Fluids/Electrolytes/Nutrition: Monitor I/O. Check daily weights to monitor for signs of overload.  8. Acute renal insufficiency: Improved. Off HCTZ at this time. 10. HTN: Will monitor every 8 hours and allow permissive HTN for adequate perfusion. 11.  Leucocytosis: recheck Monday. afebrile   LOS (Days) 25 A FACE TO FACE EVALUATION WAS PERFORMED  Francina Beery T 07/27/2014, 8:39 AM

## 2014-07-28 ENCOUNTER — Inpatient Hospital Stay (HOSPITAL_COMMUNITY): Payer: Medicare Other

## 2014-07-28 ENCOUNTER — Inpatient Hospital Stay (HOSPITAL_COMMUNITY): Payer: Medicare Other | Admitting: Occupational Therapy

## 2014-07-28 NOTE — Progress Notes (Signed)
Occupational Therapy Session Note  Patient Details  Name: [name] MRN: [id number] Date of Birth: 1925/08/16  Today's Date: 07/28/2014 OT Individual Time: 8016-5537 OT Individual Time Calculation (min): 75 min    Short Term Goals: Week 4:  OT Short Term Goal 1 (Week 4): STGs= LTGs secondary to estimated upcoming discharge date  Skilled Therapeutic Interventions/Progress Updates:  Upon entering the room, pt seated in wheelchair with no c/o pain this session. Pt reporting did not need to wash or change LB as that was done prior by NT. His wife, Herbert Pun, present during session. Skilled OT session with focus on self care, pt/family education, and R UE NMR. Pt performed UB bathing at dressing at sink side with pt showing poor recall of hemiplegic dressing techniques and requiring Mod- max verbal cues for sequencing of tasks. Pt utilized R UE with HOH assist to apply deodorant and wash chest. Bathing performed with Min A as pt washed 3/4 parts ( chest, abdomen, and R arm. OT provided education and demonstration to caregiver regarding PROM exercises for shoulder,elbow, wrist, and digits. His wife returned demonstrations x 5 reps in order to demonstrate understanding and proper technique. She has paper handout as well with detailed notes and pictures of these exercises. Pt seated in wheelchair with wife present and QRB donned upon exiting the room.    Therapy Documentation Precautions:  Precautions Precautions: Fall Precaution Comments: R hemiplegia, R gaze preference Restrictions Weight Bearing Restrictions: No  See FIM for current functional status  Therapy/Group: Individual Therapy  [name] 07/28/2014, 2:30 PM

## 2014-07-28 NOTE — Progress Notes (Signed)
Physical Therapy Session Note  Patient Details  Name: [name] MRN: [id number] Date of Birth: Feb 22, 1926  Today's Date: 07/28/2014 PT Individual Time: Treatment Session 1: 0900-1000; Treatment Session 2: 6837-2902 PT Individual Time Calculation (min): Treatment Session 1: 60 min; Treatment Session 2: 48min  Short Term Goals: Week 4:  PT Short Term Goal 1 (Week 4): =LTG's (downgraded due to lack of progress and probable D/C SNF)  Skilled Therapeutic Interventions/Progress Updates:  Treatment Session 1:  1:1. Pt received semi-reclined in bed, ready for therapy. Denied having any pain. Focus this session on functional endurance, functional transfers, toileting, stretching and NMR. Pt req max A with use of bed rail for t/f sup>sit EOB and achieve static sitting balance. Immediately upon sitting up, pt stated need to use bathroom. Pt req max Ax1person for squat pivot t/f bed>BSC>w/c as well as to maintain static standing during pericare and clothing management. Pt transported room<>therapy gym via w/c by therapist for time management.   Stretching performed while seated in w/c to trunk with towel roll positioned along spine and to B LE hamstrings, gastrocs and glutes to address gross extensibility.  Pt utilized NuStep to target B LE strength and coordination, excellent tolerance to level 1x22min. Therapist providing light manual facilitation for R LE during task to maintain slow, but smooth reciprocal pattern.   Pt left sitting in w/c at end of session w/ all needs in reach, quick release belt in place and wife in room.    Treatment Session 2:  1:1. Pt received sitting in w/c, ready for therapy. Focus this session on functional endurance, gait training, functional transfers and B LE stretching. Pt transported from room<>gym via w/c for time management. Attempted to initiate gait training with use of hemi-walker, however, pt demonstrating very poor frustration tolerance regarding use of  hemi-walker, perseveration on minute details and ultimately stating that he "just couldn't do it". Transition to ambulation with use of rail in hall for L UE support to eliminate management of unilateral device for improved attention/tolerance to task. Pt amb 20'x3 bouts with L UE support on rail and heavy max Ax1person due to strong posterior lean and +2 for w/c follow. Therapist manually facilitate increased trunk flexion, B weightshifting and advancement/placement of R LE. Pt encouraged to slide L UE out in front of him to further facilitate trunk flexion. Pt req mod-max A for squat pivot t/f tx mat<>w/c>bed and t/f sit<>sup on tx mat and bed. In supine with wedge positioned for comfort, stretching performed to B gastrocs, hamstrings +/- knee extension as well as IR/ER hip rotators to address muscle extensibility. Pt with overall fair tolerance and continued muscle guarding of L side despite cues for relaxation. Pt assisted back to bed at end of session, left semi-reclined in bed w/ all needs in reach and bed alarm on.   Therapy Documentation Precautions:  Precautions Precautions: Fall Precaution Comments: R hemiplegia, R gaze preference Restrictions Weight Bearing Restrictions: No  Pain: Pain Assessment Pain Score: 0-No pain  See FIM for current functional status  Therapy/Group: Individual Therapy  Gilmore Laroche 07/28/2014, 12:46 PM

## 2014-07-28 NOTE — Progress Notes (Signed)
Subjective/Complaints:  No problems yesterday. "i am still slow on my right side" Review of Systems - Negative except weakness on right side Objective: Vital Signs: Blood pressure 151/68, pulse 61, temperature 98.1 F (36.7 C), temperature source Oral, resp. rate 18, height 5' 9"  (1.753 m), weight 62.2 kg (137 lb 2 oz), SpO2 97 %. No results found. Results for orders placed or performed during the hospital encounter of 07/02/14 (from the past 72 hour(s))  Creatinine, serum     Status: Abnormal   Collection Time: 07/27/14  7:20 AM  Result Value Ref Range   Creatinine, Ser 0.76 0.50 - 1.35 mg/dL   GFR calc non Af Amer 80 (L) >90 mL/min   GFR calc Af Amer >90 >90 mL/min    Comment: (NOTE) The eGFR has been calculated using the CKD EPI equation. This calculation has not been validated in all clinical situations. eGFR's persistently <90 mL/min signify possible Chronic Kidney Disease.      HEENT: normal, no evidence of thrush Cardio: RRR Resp: CTA B/L, no resp distress GI: BS positive and NT,ND Extremity:  Edema Right hand and Right foot Skin:   Wound vertex of scalp, eschar Neuro: Lethargic, Cranial Nerve Abnormalities Right central 7, Abnormal Sensory reduced on left side to LT, Abnormal Motor 0/5 on right UE except trace at finger and wrist flexors  2-Right foot plantar flexion and Tone:  Emerging tone RLE---1/4. DTR's 3+. Apraxic with movement RUE and RLE Left side 5/5 in UE and LE Musc/Skel:  Other no pain with Right shoulder ROM. Right heel cord tight Gen NAD   Assessment/Plan: 1. Functional deficits secondary to left paramedian pontine infarct Right hemiparesis which require 3+ hours per day of interdisciplinary therapy in a comprehensive inpatient rehab setting. Physiatrist is providing close team supervision and 24 hour management of active medical problems listed below. Physiatrist and rehab team continue to assess barriers to discharge/monitor patient progress toward  functional and medical goals.  Potential SNF placement early this coming week.  Marland KitchenFIM: FIM - Bathing Bathing Steps Patient Completed: Chest, Right Arm, Front perineal area, Right upper leg, Left upper leg, Abdomen Bathing: 3: Mod-Patient completes 5-7 74f10 parts or 50-74%  FIM - Upper Body Dressing/Undressing Upper body dressing/undressing steps patient completed: Thread/unthread left sleeve of pullover shirt/dress, Put head through opening of pull over shirt/dress, Pull shirt over trunk Upper body dressing/undressing: 4: Min-Patient completed 75 plus % of tasks FIM - Lower Body Dressing/Undressing Lower body dressing/undressing steps patient completed: Thread/unthread left pants leg Lower body dressing/undressing: 1: Two helpers  FIM - Toileting Toileting: 1: Two helpers  FIM - TRadio producerDevices: BRecruitment consultantTransfers: 2-From toilet/BSC: Max A (lift and lower assist), 2-To toilet/BSC: Max A (lift and lower assist)  FIM - BControl and instrumentation engineerDevices: HOB elevated Bed/Chair Transfer: 2: Chair or W/C > Bed: Max A (lift and lower assist), 2: Bed > Chair or W/C: Max A (lift and lower assist), 3: Supine > Sit: Mod A (lifting assist/Pt. 50-74%/lift 2 legs  FIM - Locomotion: Wheelchair Distance: 45 Locomotion: Wheelchair: 1: Travels less than 50 ft with moderate assistance (Pt: 50 - 74%) FIM - Locomotion: Ambulation Locomotion: Ambulation Assistive Devices: WMuseum/gallery curatorAmbulation/Gait Assistance: 2: Max assist, 1: +2 Total assist Locomotion: Ambulation: 0: Activity did not occur  Comprehension Comprehension Mode: Auditory Comprehension: 5-Follows basic conversation/direction: With extra time/assistive device  Expression Expression Mode: Verbal Expression: 4-Expresses basic 75 - 89% of the time/requires cueing  10 - 24% of the time. Needs helper to occlude trach/needs to repeat words.  Social  Interaction Social Interaction: 5-Interacts appropriately 90% of the time - Needs monitoring or encouragement for participation or interaction.  Problem Solving Problem Solving: 4-Solves basic 75 - 89% of the time/requires cueing 10 - 24% of the time  Memory Memory: 5-Recognizes or recalls 90% of the time/requires cueing < 10% of the time   Medical Problem List and Plan: 1. Functional deficits secondary to left paramedian pontine infarct 2. DVT Prophylaxis/Anticoagulation: Pharmaceutical: Lovenox 3. Pain Management: N/A  -ROM for emerging tone LLE, patient ed 4. Mood: LCSW to follow for evaluation and support.  5. Neuropsych: This patient is not capable of making decisions on his own behalf. 6. Skin/Wound Care:hx melanoma removal scalp, vaseline  - Routine pressure relief measures.   -Turn patient every 2 hours when in bed.  7. Fluids/Electrolytes/Nutrition: Monitor I/O. Check daily weights to monitor for signs of overload.  8. Acute renal insufficiency: Improved. Off HCTZ at this time. 10. HTN: Will monitor every 8 hours and allow permissive HTN for adequate perfusion. 11.  Leucocytosis: recheck Monday. afebrile   LOS (Days) 26 A FACE TO FACE EVALUATION WAS PERFORMED  Donnelle Rubey T 07/28/2014, 8:39 AM

## 2014-07-29 ENCOUNTER — Inpatient Hospital Stay (HOSPITAL_COMMUNITY): Payer: Medicare Other | Admitting: *Deleted

## 2014-07-29 ENCOUNTER — Inpatient Hospital Stay (HOSPITAL_COMMUNITY): Payer: Medicare Other

## 2014-07-29 MED ORDER — OXYMETAZOLINE HCL 0.05 % NA SOLN
1.0000 | Freq: Two times a day (BID) | NASAL | Status: DC
  Administered 2014-07-29 – 2014-07-30 (×2): 1 via NASAL
  Filled 2014-07-29: qty 15

## 2014-07-29 NOTE — Progress Notes (Signed)
Subjective/Complaints:  Voided a lot overnight and this am. Was trying to drink more water to "flush system" Review of Systems - Negative except weakness on right side Objective: Vital Signs: Blood pressure 143/66, pulse 63, temperature 97.8 F (36.6 C), temperature source Oral, resp. rate 18, height 5' 9"  (1.753 m), weight 62.2 kg (137 lb 2 oz), SpO2 99 %. No results found. Results for orders placed or performed during the hospital encounter of 07/02/14 (from the past 72 hour(s))  Creatinine, serum     Status: Abnormal   Collection Time: 07/27/14  7:20 AM  Result Value Ref Range   Creatinine, Ser 0.76 0.50 - 1.35 mg/dL   GFR calc non Af Amer 80 (L) >90 mL/min   GFR calc Af Amer >90 >90 mL/min    Comment: (NOTE) The eGFR has been calculated using the CKD EPI equation. This calculation has not been validated in all clinical situations. eGFR's persistently <90 mL/min signify possible Chronic Kidney Disease.      HEENT: normal, no evidence of thrush Cardio: RRR Resp: CTA B/L, no resp distress GI: BS positive and NT,ND Extremity:  Edema Right hand and Right foot Skin:   Wound vertex of scalp, eschar Neuro: Lethargic, Cranial Nerve Abnormalities Right central 7, Abnormal Sensory reduced on left side to LT, Abnormal Motor 0/5 on right UE except trace at finger and wrist flexors  2-Right foot plantar flexion and Tone:  Emerging tone RLE---1/4. DTR's 3+. Apraxic with movement RUE and RLE Left side 5/5 in UE and LE Musc/Skel:  Other no pain with Right shoulder ROM. Right heel cord tight Gen NAD   Assessment/Plan: 1. Functional deficits secondary to left paramedian pontine infarct Right hemiparesis which require 3+ hours per day of interdisciplinary therapy in a comprehensive inpatient rehab setting. Physiatrist is providing close team supervision and 24 hour management of active medical problems listed below. Physiatrist and rehab team continue to assess barriers to discharge/monitor  patient progress toward functional and medical goals.  Potential SNF placement early this coming week.  Marland KitchenFIM: FIM - Bathing Bathing Steps Patient Completed: Chest, Right Arm, Abdomen Bathing: 4: Min-Patient completes 8-9 [id number] parts or 75+ percent  FIM - Upper Body Dressing/Undressing Upper body dressing/undressing steps patient completed: Thread/unthread left sleeve of pullover shirt/dress, Put head through opening of pull over shirt/dress, Pull shirt over trunk Upper body dressing/undressing: 4: Min-Patient completed 75 plus % of tasks FIM - Lower Body Dressing/Undressing Lower body dressing/undressing steps patient completed: Thread/unthread left pants leg Lower body dressing/undressing: 1: Two helpers  FIM - Toileting Toileting: 1: Two helpers  FIM - TRadio producerDevices: BRecruitment consultantTransfers: 2-From toilet/BSC: Max A (lift and lower assist), 2-To toilet/BSC: Max A (lift and lower assist)  FIM - BControl and instrumentation engineerDevices: Arm rests, Bed rails Bed/Chair Transfer: 2: Chair or W/C > Bed: Max A (lift and lower assist), 2: Bed > Chair or W/C: Max A (lift and lower assist), 2: Supine > Sit: Max A (lifting assist/Pt. 25-49%)  FIM - Locomotion: Wheelchair Distance: 45 Locomotion: Wheelchair: 1: Total Assistance/staff pushes wheelchair (Pt<25%) FIM - Locomotion: Ambulation Locomotion: Ambulation Assistive Devices: Other (comment) (Psychologist, sport and exercise Ambulation/Gait Assistance: 2: Max assist, 1: +2 Total assist Locomotion: Ambulation: 1: Two helpers  Comprehension Comprehension Mode: Auditory Comprehension: 5-Follows basic conversation/direction: With extra time/assistive device  Expression Expression Mode: Verbal Expression: 4-Expresses basic 75 - 89% of the time/requires cueing 10 - 24% of the time. Needs helper to occlude trach/needs to  repeat words.  Social Interaction Social Interaction: 5-Interacts appropriately  90% of the time - Needs monitoring or encouragement for participation or interaction.  Problem Solving Problem Solving: 4-Solves basic 75 - 89% of the time/requires cueing 10 - 24% of the time  Memory Memory: 5-Recognizes or recalls 90% of the time/requires cueing < 10% of the time   Medical Problem List and Plan: 1. Functional deficits secondary to left paramedian pontine infarct 2. DVT Prophylaxis/Anticoagulation: Pharmaceutical: Lovenox 3. Pain Management: N/A  -continue stretching, ROM for increased tone 4. Mood: LCSW to follow for evaluation and support.  5. Neuropsych: This patient is not capable of making decisions on his own behalf. 6. Skin/Wound Care:hx melanoma removal scalp, vaseline  - Routine pressure relief measures.   -Turn patient every 2 hours when in bed.  7. Fluids/Electrolytes/Nutrition: Monitor I/O. Check daily weights to monitor for signs of overload.  8. Acute renal insufficiency: Improved. Off HCTZ at this time. 10. HTN: Will monitor every 8 hours and allow permissive HTN for adequate perfusion. 11.  Leucocytosis: recheck Monday. afebrile   LOS (Days) 27 A FACE TO FACE EVALUATION WAS PERFORMED  SWARTZ,ZACHARY T 07/29/2014, 8:38 AM

## 2014-07-29 NOTE — Progress Notes (Signed)
Physical Therapy Session Note  Patient Details  Name: [name] MRN: [id number] Date of Birth: 1926-07-08  Today's Date: 07/29/2014 PT Individual Time: 0900-1000 PT Individual Time Calculation (min): 60 min   Short Term Goals: Week 4:  PT Short Term Goal 1 (Week 4): =LTG's (downgraded due to lack of progress and probable D/C SNF)  Skilled Therapeutic Interventions/Progress Updates:    Pt received supine in bed, agreeable to participate in therapy after min encouragement. Pt moved supine>sit w/ ModA for bringing trunk up to sitting. Multiple squat pivots throughout session w/ overall MaxA to L and to R w/ pt demonstrating heavy push to R while transferring to L. Pt tansferred w/ MaxA w/c<>BSC, +2A for hygiene and clothing management before/after toileting. Pt propelled w/c 39' w/ MinA for maintaining straight path, then transported remaining distance for energy conservation. Pt ambulated 30'x2 in hallway w/ use of hallway rail on L, MaxA from therapist w/ intermittent Mod when pt was able to weight shift forward and WB through LUE. Therapist provided assist to advance R limb, block R knee in stance, pt's gait demonstrated by heavy push to R and intermittent posterior lean. Pt requested to finish session with Nustep, pt completed 5' on Nustep L3 w/ LE only for general cardiovascular endurance, reciprocal sensory feedback for RLE. Pt tolerated session fair, reported feeling fatigued. Session ended in pt's room where pt was left seated in w/c w/ all needs within reach. Throughout session pt demonstrated poor frustration tolerance and required min-mod encouragement to participate in upright mobility.    Therapy Documentation Precautions:  Precautions Precautions: Fall Precaution Comments: R hemiplegia, R gaze preference Restrictions Weight Bearing Restrictions: No Pain:  No/denies pain  See FIM for current functional status  Therapy/Group: Individual Therapy  Rada Hay  Rada Hay,  PT, DPT 07/29/2014, 7:47 AM

## 2014-07-29 NOTE — Progress Notes (Signed)
Occupational Therapy Session Note  Patient Details  Name: [name] MRN: [id number] Date of Birth: 09-Jun-1926  Today's Date: 07/29/2014 OT Individual Time:  -   3300-7622  (29mn)      Short Term Goals: Week 1:  OT Short Term Goal 1 (Week 1): Pt will complete toilet transfer with max assist +1 OT Short Term Goal 1 - Progress (Week 1): Progressing toward goal OT Short Term Goal 2 (Week 1): Pt will complete LB dressing with max assist +1 OT Short Term Goal 2 - Progress (Week 1): Progressing toward goal OT Short Term Goal 3 (Week 1): Pt will complete UB dressing with max assist  OT Short Term Goal 3 - Progress (Week 1): Met OT Short Term Goal 4 (Week 1): Pt will sit unsupported during functional task for 2 min with min assist  OT Short Term Goal 4 - Progress (Week 1): Progressing toward goal Week 2:  OT Short Term Goal 1 (Week 2): Pt will peform UB dressing with Mod A in order to increase I in self care. OT Short Term Goal 1 - Progress (Week 2): Met OT Short Term Goal 2 (Week 2): Pt will perform shower transfer with Max A of 1 in order to increase i in self care. OT Short Term Goal 2 - Progress (Week 2): Met OT Short Term Goal 3 (Week 2): Pt will perform grooming with min A in order to increase I in self care. OT Short Term Goal 3 - Progress (Week 2): Met OT Short Term Goal 4 (Week 2): Pt will perform LB dressing with Max A of 1 in order to increase i in self care.  OT Short Term Goal 4 - Progress (Week 2): Progressing toward goal Week 3:  OT Short Term Goal 1 (Week 3): Patient's caregiver will demonstrate proper technique of PROM to R UE for stretching.  OT Short Term Goal 1 - Progress (Week 3): Progressing toward goal OT Short Term Goal 2 (Week 3): Pt will perform LB bathing and dressing with Max A of 1 in order to decrease level of assistance for self care.  OT Short Term Goal 2 - Progress (Week 3): Progressing toward goal OT Short Term Goal 3 (Week 3): Pt will perform toilet  transfer with Mod A in order to decrease assistance in functional transfers. OT Short Term Goal 3 - Progress (Week 3): Progressing toward goal OT Short Term Goal 4 (Week 3): Pt will perform shower transfer with Mod A in order to decrease assistance in functional transfers. OT Short Term Goal 4 - Progress (Week 3): Progressing toward goal  Skilled Therapeutic Interventions/Progress Updates:    Pt received supine in bed, agreeable to participate in therapy.  Addressed bed mobility, sitting balance with focus on using abdominals, transfers, scooting laterally.    Pt. Went from supine to sidelying to address more movements in transverse plane.  Pt. Was max assist.  Sat EOB with max verbal cues for postural control and using abdominals.  Provided PROM and AAROM to RUE while wife watched.  Pt. Had trace movement in shoulder flexion and internal rotation.  Did lateral scooting with Bobath strategy for facilitation of trunk flexion.  Pt has strong back extensors and educated pt on using his abdominal muscles.  Transferred from bed to wc going to right side.  Used Bobath technique to pt postioning into flexion and control.  Washed hands at sink with pt washing left hand with right for 10 seconds.  Pt had  no awareness of it.  Reinforced sustained attention and concentration to proprioceptive feedback.  Left pt in wc with call bell,phone within reach.     Therapy Documentation Precautions:  Precautions Precautions: Fall Precaution Comments: R hemiplegia, R gaze preference Restrictions Weight Bearing Restrictions: No   Pain:  none     See FIM for current functional status  Therapy/Group: Individual Therapy  Lisa Roca 07/29/2014, 5:35 PM

## 2014-07-30 ENCOUNTER — Inpatient Hospital Stay (HOSPITAL_COMMUNITY): Payer: Medicare Other | Admitting: Physical Therapy

## 2014-07-30 ENCOUNTER — Inpatient Hospital Stay (HOSPITAL_COMMUNITY): Payer: Medicare Other

## 2014-07-30 ENCOUNTER — Inpatient Hospital Stay (HOSPITAL_COMMUNITY): Payer: Medicare Other | Admitting: Speech Pathology

## 2014-07-30 ENCOUNTER — Inpatient Hospital Stay (HOSPITAL_COMMUNITY): Payer: Medicare Other | Admitting: *Deleted

## 2014-07-30 LAB — CBC
HEMATOCRIT: 32.7 % — AB (ref 39.0–52.0)
Hemoglobin: 10.7 g/dL — ABNORMAL LOW (ref 13.0–17.0)
MCH: 28.2 pg (ref 26.0–34.0)
MCHC: 32.7 g/dL (ref 30.0–36.0)
MCV: 86.1 fL (ref 78.0–100.0)
Platelets: 281 10*3/uL (ref 150–400)
RBC: 3.8 MIL/uL — AB (ref 4.22–5.81)
RDW: 14.1 % (ref 11.5–15.5)
WBC: 9.2 10*3/uL (ref 4.0–10.5)

## 2014-07-30 MED ORDER — IPRATROPIUM-ALBUTEROL 0.5-2.5 (3) MG/3ML IN SOLN: 3.0000 mL | RESPIRATORY_TRACT | Status: DC | PRN

## 2014-07-30 MED ORDER — CALCIUM POLYCARBOPHIL 625 MG PO TABS: 625.0000 mg | ORAL_TABLET | Freq: Two times a day (BID) | ORAL | Status: DC

## 2014-07-30 MED ORDER — WHITE PETROLATUM GEL
1.0000 | Freq: Two times a day (BID) | Status: DC
Start: 2014-07-30 — End: 2014-12-12

## 2014-07-30 MED ORDER — ENSURE PUDDING PO PUDG: 1.0000 | Freq: Three times a day (TID) | ORAL | Status: DC

## 2014-07-30 MED ORDER — ACETAMINOPHEN 325 MG PO TABS: 650.0000 mg | ORAL_TABLET | Freq: Four times a day (QID) | ORAL | Status: DC | PRN

## 2014-07-30 MED ORDER — PANTOPRAZOLE SODIUM 40 MG PO TBEC: 40.0000 mg | DELAYED_RELEASE_TABLET | Freq: Every day | ORAL | Status: DC

## 2014-07-30 MED ORDER — SENNOSIDES-DOCUSATE SODIUM 8.6-50 MG PO TABS: 1.0000 | ORAL_TABLET | Freq: Every evening | ORAL | Status: DC | PRN

## 2014-07-30 MED ORDER — SACCHAROMYCES BOULARDII 250 MG PO CAPS: 250.0000 mg | ORAL_CAPSULE | Freq: Two times a day (BID) | ORAL | Status: DC

## 2014-07-30 MED ORDER — ASPIRIN 325 MG PO TABS: 325.0000 mg | ORAL_TABLET | Freq: Every day | ORAL | Status: DC

## 2014-07-30 MED ORDER — ATORVASTATIN CALCIUM 20 MG PO TABS: 20.0000 mg | ORAL_TABLET | Freq: Every day | ORAL | Status: AC

## 2014-07-30 MED ORDER — BACLOFEN 5 MG HALF TABLET: 5.0000 mg | ORAL_TABLET | Freq: Three times a day (TID) | ORAL | Status: DC | PRN

## 2014-07-30 NOTE — Plan of Care (Signed)
Problem: RH Balance Goal: LTG Patient will maintain dynamic sitting balance (PT) LTG: Patient will maintain dynamic sitting balance with assistance during mobility activities (PT)  Downgraded due to lack of progress and D/C change to SNF.   Problem: RH Bed Mobility Goal: LTG Patient will perform bed mobility with assist (PT) LTG: Patient will perform bed mobility with assistance, with/without cues (PT).  Downgraded due to lack of progress and change in D/C to SNF.   Problem: RH Bed to Chair Transfers Goal: LTG Patient will perform bed/chair transfers w/assist (PT) LTG: Patient will perform bed/chair transfers with assistance, with/without cues (PT).  Downgraded due to lack of progress and continued pusher tendencies when transferring to the L.   Problem: RH Car Transfers Goal: LTG Patient will perform car transfers with assist (PT) LTG: Patient will perform car transfers with assistance (PT).  Outcome: Not Applicable Date Met:  24/49/75 D/C'd due to change in D/C plan to SNF.   Problem: RH Furniture Transfers Goal: LTG Patient will perform furniture transfers w/assist (OT/PT LTG: Patient will perform furniture transfers with assistance (OT/PT).  Outcome: Not Applicable Date Met:  30/05/11 D/C'd goal due to change in D/C plan to SNF.   Problem: RH Wheelchair Mobility Goal: LTG Patient will propel w/c in home environment (PT) LTG: Patient will propel wheelchair in home environment, # of feet with assistance (PT).  Outcome: Not Applicable Date Met:  09/07/15 Downgraded w/c mobility goals and D/C'd home w/c mobility goals due to lack of progress and change in D/C plan to SNF.

## 2014-07-30 NOTE — Therapy (Signed)
Physical Therapy Session Note  Patient Details  Name: [name] MRN: [id number] Date of Birth: [birth year]  Today's Date: 07/30/2014 PT Individual Time: 2952-8413 PT Individual Time Calculation (min): 45 min   Short Term Goals: Week 4:  PT Short Term Goal 1 (Week 4): =LTG's (downgraded due to lack of progress and probable D/C SNF)  Skilled Therapeutic Interventions/Progress Updates:   Pt received sitting in w/c in room, agreeable to therapy session.  Assisted to/from therapy gym in order to work on bed mobility, gait, and seated nustep for NMR through LLE.  Performed w/c<>mat transfer and nustep>w/c transfer via squat pivot at mod/max A level (heavy max to the L due to continued pushing).  Continues to require heavy facilitation and max verbal cues for forward flexion as he continues to use strong extensor pattern.  Performed sit>R SL at mod/max A level with cues for self assisting LLE and assist for safety of RUE.  Rolled to supine at min A level.  Performed supine to R SL at min A level, again with cues for safety of RUE.  Performed SL>sit at mod A with good ability of maintaining trunk flex and rotation with light facilitation.  Pt then agreeable to gait using L handrail in hallway.  Performed at mod/max A level with great ability to advance RLE on his own for 70% of time.  Continues to require facilitation for hip extension and anterior pelvic tilt as well as mod/max cues for safety, upright posture and stepping sequence.  Then encouraged pt to ambulate short distance with hemi walker.  Pt initially resistant, but then agreed with encouragement and motivation.  Performed approx 20' with hemi walker at max A level.  Same cues and facilitation as above, but MAX verbal cues for safety when turning to sit as pusher tendencies continued to increase as he needed to retro step RLE.  Once seated, again provided stern education on importance of listening to therapist for safety.  Ended session with seated  nustep x 6 mins at level 3 resistance with BUEs (assist for RUE) and LEs for overall strengthening, conditioning and NMR through RUE/LE.  Transferred back to w/c and assisted back to room.  Left in w/c with all needs in reach, nurse tech to assist back to bed.    Therapy Documentation Precautions:  Precautions Precautions: Fall Precaution Comments: R hemiplegia, R gaze preference, pusher to the R Restrictions Weight Bearing Restrictions: No  Pain: pt with no reports of pain during session.   See FIM for current functional status  Therapy/Group: Individual Therapy  [name] 07/30/2014, 1:51 PM

## 2014-07-30 NOTE — Progress Notes (Signed)
Occupational Therapy Session Note  Patient Details  Name: [name] MRN: [id number] Date of Birth: [birth year]  Today's Date: 07/30/2014 OT Individual Time: 7793-9030 OT Individual Time Calculation (min): 60 min    Short Term Goals: Week 4:  OT Short Term Goal 1 (Week 4): STGs= LTGs secondary to estimated upcoming discharge date  Skilled Therapeutic Interventions/Progress Updates:    Pt seen for ADL retraining with focus on bed mobility, functional transfers, postural control, and problem solving. Pt received supine in bed. Completed supine>sit with max A and mod cues for sequencing. Pt required max-SBA for static sitting balance sitting EOB. Completed scooting with max cues for sequencing and mod A. Completed squat pivot transfer bed>w/c with max A using Bobath technique to facilitate anterior weight shift throughout transfer. Pt required max A for sit<>stand and standing balance while other staff member assisted with hygiene and clothing management. Completed bathing and dressing from w/c level at sink. Provided hand over hand assist to RUE for applying deodorant and washing LUE with trace elbow extension and shoulder adduction noted. Completed sit<>stand at sink with max A and second person to manage clothing. Pt required min cues for carryover of hemi dressing technique to don shift and mod assist to complete task. Pt left sitting in w/c with arm tray donned and quick release belt placed.   Therapy Documentation Precautions:  Precautions Precautions: Fall Precaution Comments: R hemiplegia, R gaze preference Restrictions Weight Bearing Restrictions: No General:   Vital Signs:  Pain: No report of pain.  See FIM for current functional status  Therapy/Group: Individual Therapy  Duayne Cal 07/30/2014, 9:48 AM

## 2014-07-30 NOTE — Progress Notes (Signed)
Physical Therapy Discharge Summary  Patient Details  Name: [name] MRN: [id number] Date of Birth: [birth year]   Patient has met 8 of 8 long term goals due to improved activity tolerance, improved balance, improved postural control, increased strength, ability to compensate for deficits, improved attention and improved awareness.  Patient to discharge at a wheelchair level [place].   Pt to D/C to SNF, therefore no formal hands on training performed, will defer to next venue of care.   Reasons goals not met: Pt continues to require up to max A for all mobility and therefore continue to recommend D/C to SNF.   Recommendation:  Patient will benefit from ongoing skilled PT services in skilled nursing facility setting to continue to advance safe functional mobility, address ongoing impairments in balance, decreased awareness, decreased attention, decreased functional use of RUE/LE, and minimize fall risk.  Equipment: No equipment provided  Reasons for discharge: treatment goals met and discharge from hospital  Patient/family agrees with progress made and goals achieved: Yes  PT Discharge Precautions/Restrictions Precautions Precautions: Fall Precaution Comments: R hemiplegia, R gaze preference, pusher to the R Restrictions Weight Bearing Restrictions: No   Vision/Perception   See OT note Cognition Overall Cognitive Status: Impaired/Different from baseline Arousal/Alertness: Awake/alert Orientation Level: Oriented X4 Attention: Sustained;Selective Sustained Attention: Appears intact Selective Attention: Impaired Selective Attention Impairment: Functional complex Memory: Impaired Memory Impairment: Decreased recall of new information Safety/Judgment: Appears intact Comments: Pt able to verbalize that he is not able to get up on his own and requests 2 people for transfers with nursing.  Sensation Sensation Light Touch: Appears Intact Stereognosis: Not tested Hot/Cold:  Not tested Coordination Gross Motor Movements are Fluid and Coordinated: No Fine Motor Movements are Fluid and Coordinated: No Coordination and Movement Description: Impaired due to dense L hemiplegia Finger Nose Finger Test: unable to complete with L (dense hemiparesis) Motor  Motor Motor: Hemiplegia;Abnormal postural alignment and control Motor - Discharge Observations: Pt continues to have decreased functional strength in RUE/LE, decreased postural control with perceptual issues.   Mobility Bed Mobility Bed Mobility: Supine to Sit;Sit to Supine Supine to Sit: 3: Mod assist Supine to Sit Details: Verbal cues for sequencing;Verbal cues for technique;Verbal cues for precautions/safety;Manual facilitation for weight bearing;Manual facilitation for weight shifting;Manual facilitation for placement Sit to Supine: 3: Mod assist;2: Max assist Sit to Supine - Details: Verbal cues for sequencing;Verbal cues for technique;Verbal cues for precautions/safety;Manual facilitation for weight shifting;Manual facilitation for weight bearing;Manual facilitation for placement Transfers Transfers: Yes Sit to Stand: 2: Max assist Sit to Stand Details: Verbal cues for sequencing;Verbal cues for technique;Verbal cues for precautions/safety;Manual facilitation for weight shifting;Manual facilitation for weight bearing Stand to Sit: 2: Max assist Stand to Sit Details (indicate cue type and reason): Verbal cues for sequencing;Verbal cues for technique;Verbal cues for precautions/safety;Manual facilitation for weight shifting;Manual facilitation for weight bearing Squat Pivot Transfers: 3: Mod assist;2: Max Teacher, English as a foreign language Transfer Details: Verbal cues for sequencing;Verbal cues for technique;Verbal cues for precautions/safety;Manual facilitation for weight shifting;Manual facilitation for weight bearing Squat Pivot Transfer Details (indicate cue type and reason): mod A to the R and max A to the L due to pusher  tendencies.  Locomotion  Ambulation Ambulation: Yes Ambulation/Gait Assistance: 2: Max assist Ambulation Distance (Feet): 40 Feet Assistive device: Hemi-walker Ambulation/Gait Assistance Details: Verbal cues for sequencing;Verbal cues for technique;Verbal cues for precautions/safety;Manual facilitation for weight shifting;Verbal cues for safe use of DME/AE;Verbal cues for gait pattern;Manual facilitation for placement;Manual facilitation for weight bearing Gait Gait:  Yes Gait Pattern: Impaired Gait Pattern: Step-to pattern;Decreased hip/knee flexion - right;Decreased stride length;Poor foot clearance - right;Narrow base of support;Trunk flexed;Lateral trunk lean to right;Right flexed knee in stance Stairs / Additional Locomotion Stairs: Yes Stairs Assistance: 1: +2 Total assist Stairs Assistance Details: Verbal cues for sequencing;Verbal cues for technique;Verbal cues for precautions/safety;Manual facilitation for weight shifting;Manual facilitation for weight bearing;Manual facilitation for placement;Verbal cues for safe use of DME/AE;Verbal cues for gait pattern Stair Management Technique: One rail Left;Step to pattern;Forwards Architect: Yes Wheelchair Assistance: 4: Advertising account executive Details: Verbal cues for sequencing;Verbal cues for technique;Verbal cues for precautions/safety;Tactile cues for initiation;Tactile cues for sequencing Wheelchair Propulsion: Left upper extremity;Left lower extremity Wheelchair Parts Management: Needs assistance Distance: 50  Trunk/Postural Assessment  Cervical Assessment Cervical Assessment: Exceptions to [facility] (forward head) Thoracic Assessment Thoracic Assessment: Exceptions to [facility] (kyphotic, R lateral lean) Lumbar Assessment Lumbar Assessment: Exceptions to [facility] (posterior pelvic tilt, severe) Postural Control Postural Control: Deficits on evaluation Trunk Control: Pt continues to demonstrate R lateral  trunk lean due to weakness and pushing.  Better able to control with min A and cues.  Righting Reactions: delayed Protective Responses: delayed  Balance Balance Balance Assessed: Yes Static Sitting Balance Static Sitting - Balance Support: Feet supported;Left upper extremity supported Static Sitting - Level of Assistance: 4: Min assist Dynamic Sitting Balance Dynamic Sitting - Balance Support: Feet supported;Left upper extremity supported Dynamic Sitting - Level of Assistance: 4: Min assist Dynamic Sitting - Balance Activities: Lateral lean/weight shifting;Forward lean/weight shifting Static Standing Balance Static Standing - Balance Support: Left upper extremity supported Static Standing - Level of Assistance: 2: Max assist Extremity Assessment      RLE Assessment RLE Assessment: Exceptions to [facility] RLE Strength RLE Overall Strength: Deficits Right Hip Flexion: 2/5 Right Hip Extension: 3-/5 Right Knee Flexion: 2-/5 Right Knee Extension: 3-/5 Right Ankle Dorsiflexion: 0/5 Right Ankle Plantar Flexion: 0/5 LLE Assessment LLE Assessment: Within Functional Limits  See FIM for current functional status  [name] 08/01/2014, 6:17 PM

## 2014-07-30 NOTE — Progress Notes (Signed)
Physical Therapy Session Note  Patient Details  Name: [name] MRN: [id number] Date of Birth: 1926/04/11  Today's Date: 07/30/2014 PT Individual Time: 1135-1205 PT Individual Time Calculation (min): 30 min   Short Term Goals: Week 4:  PT Short Term Goal 1 (Week 4): =LTG's (downgraded due to lack of progress and probable D/C SNF)  Therapy Documentation Precautions:  Precautions Precautions: Fall Precaution Comments: R hemiplegia, R gaze preference, pusher to the R Restrictions Weight Bearing Restrictions: No     Stairs:  Patient up and down 5 steps for two trials with left handrail and max assist. Patient performed stairs with a step to pattern. Patient required max assist for hip extension, upright posture and advancement, placement, and stabilization of RLE. Patient demonstrates initiation of RLE intermittently. Patient educated on proper sequence and technique. Patient required max verbal cues for sequencing, attention to task, and  Midline orientation.   Patient trailed descending backwards secondary to fatigue after previous two attempts. Patient up and down 2 steps with left handrail mod assist with one episode of max assist. Patient performed stairs with a step to pattern descended stairs backwards. Patient required max assist for hip extension, upright posture and advancement, placement, and stabilization of RLE.   Patient brought back to room in wheelchair by [name] PT with Wheelchair seat belt engaged and call bell within reach. Spouse present at end of session. Patient tolerated treatment well.  Session focused on stair negotiation.    Therapy/Group: Individual Therapy  Retta Diones 07/30/2014, 2:40 PM

## 2014-07-30 NOTE — Progress Notes (Signed)
Social Work Patient ID: [name], male   DOB: 1926-05-17, 81 y.o.   MRN: [id number] Spoke with Casimer Lanius regarding NH bed she will have a bed on Wed.  Have informed pt and wife and they are in agreement, their daughter will be back on Wed. Have informed team and Pam-PA regarding bed for Wed.

## 2014-07-30 NOTE — Progress Notes (Signed)
Social Work Patient ID: [name], male   DOB: 01-Mar-1926, 81 y.o.   MRN: [id number] Contacted [facility] regarding bed availibility for pt.  Awaiting return call from admission coordinator.

## 2014-07-30 NOTE — Progress Notes (Signed)
Speech Language Pathology Daily Session Note  Patient Details  Name: [name] MRN: [id number] Date of Birth: 06-15-1926  Today's Date: 07/30/2014 SLP Individual Time: 1300-1400 SLP Individual Time Calculation (min): 60 min  Short Term Goals: Week 4: SLP Short Term Goal 1 (Week 4): Patient will consume Dys.3 textures and nectar-thick liquids via cup with Supervision verbal cues to utilize safe swallow strategies to minimize overt s/s of aspiration. SLP Short Term Goal 2 (Week 4): Patient will produce intelligible speech at the phrase level with Supervision multimodal cues to utilize compensatory strategies. SLP Short Term Goal 3 (Week 4): Patient will recall water protocol procedures and demonstrate safe swallow precautions with trials of water with Min verbal cues SLP Short Term Goal 4 (Week 4): Patient will attend to right upper extremity during basic functional tasks with Mod question cues.  Skilled Therapeutic Interventions:  Pt was seen for skilled ST targeting goals for dysphagia and cognition as well as ongoing pt and family education.  Upon arrival, pt was seated upright in wheelchair, awake and alert, with min encouragement needed for participation in therapy.  Pt was very intent  on needing to have a bowel movement; therefore, SLP facilitated the session with min cues for use of call bell to request additional assistance for transfer to bedside commode.   Once toileting was completed, SLP facilitated the session with mod-max question cues to recall the parameters of the water protocol.  SLP reviewed and reinforced rationale behind the water protocol in continuing to work towards liquids advancement to facilitate carryover of information both within and in between therapy sessions.  Additionally, pt required overall min-mod assist verbal cues to initiate and sequence oral care prior to trials in a timely manner.  SLP completed skilled observations during trials of regular water with pt  exhibiting immediate cough x2 which SLP suspects was related to incomplete chin tuck and larger bolus size.  No further s/s of aspiration were noted with mod cues for deep chin tuck via straw with small, controlled cup sips of water.   Pt exhibited decreased carryover of new information within a therapy session and continued to require mod-max cues for delayed recall of the parameters of the water protocol after a ~5 minute and 10 minute delay.  Continue per current plan of care.   FIM:  Comprehension Comprehension Mode: Auditory Comprehension: 5-Follows basic conversation/direction: With extra time/assistive device Expression Expression Mode: Verbal Expression: 4-Expresses basic 75 - 89% of the time/requires cueing 10 - 24% of the time. Needs helper to occlude trach/needs to repeat words. Social Interaction Social Interaction: 4-Interacts appropriately 75 - 89% of the time - Needs redirection for appropriate language or to initiate interaction. Problem Solving Problem Solving: 4-Solves basic 75 - 89% of the time/requires cueing 10 - 24% of the time Memory Memory: 3-Recognizes or recalls 50 - 74% of the time/requires cueing 25 - 49% of the time FIM - Eating Eating Activity: 4: Help with managing cup/glass;5: Needs verbal cues/supervision  Pain Pain Assessment Pain Assessment: No/denies pain  Therapy/Group: Individual Therapy  Ronda Kazmi, Selinda Orion 07/30/2014, 3:32 PM

## 2014-07-30 NOTE — Progress Notes (Signed)
Subjective/Complaints:  "am I doing better?" No pain c/os Poor awareness of swallowing problems Review of Systems - Negative except weakness on right side Objective: Vital Signs: Blood pressure 151/71, pulse 67, temperature 97.8 F (36.6 C), temperature source Oral, resp. rate 16, height 5\' 9"  (1.753 m), weight 62.2 kg (137 lb 2 oz), SpO2 98 %. No results found. Results for orders placed or performed during the hospital encounter of 07/02/14 (from the past 72 hour(s))  CBC     Status: Abnormal   Collection Time: 07/30/14  5:06 AM  Result Value Ref Range   WBC 9.2 4.0 - 10.5 K/uL   RBC 3.80 (L) 4.22 - 5.81 MIL/uL   Hemoglobin 10.7 (L) 13.0 - 17.0 g/dL   HCT 32.7 (L) 39.0 - 52.0 %   MCV 86.1 78.0 - 100.0 fL   MCH 28.2 26.0 - 34.0 pg   MCHC 32.7 30.0 - 36.0 g/dL   RDW 14.1 11.5 - 15.5 %   Platelets 281 150 - 400 K/uL     HEENT: normal, no evidence of thrush Cardio: RRR Resp: CTA B/L, no resp distress GI: BS positive and NT,ND Extremity:  Edema Right hand and Right foot Skin:   Wound vertex of scalp, eschar Neuro: Lethargic, Cranial Nerve Abnormalities Right central 7,  Abnormal Motor 0/5 on right UE except trace at finger and wrist flexors , 3- Right hip extensor synergy  2-Right foot plantar flexion and Tone:  Emerging tone RLE---1/4. DTR's 3+.  Left side 5/5 in UE and LE Musc/Skel:  Other no pain with Right shoulder ROM. Right heel cord tight Gen NAD   Assessment/Plan: 1. Functional deficits secondary to left paramedian pontine infarct Right hemiparesis which require 3+ hours per day of interdisciplinary therapy in a comprehensive inpatient rehab setting. Physiatrist is providing close team supervision and 24 hour management of active medical problems listed below. Physiatrist and rehab team continue to assess barriers to discharge/monitor patient progress toward functional and medical goals.  Potential SNF placement early this coming week.  Marland KitchenFIM: FIM -  Bathing Bathing Steps Patient Completed: Chest, Right Arm, Abdomen Bathing: 4: Min-Patient completes 8-9 45f 10 parts or 75+ percent  FIM - Upper Body Dressing/Undressing Upper body dressing/undressing steps patient completed: Thread/unthread left sleeve of pullover shirt/dress, Put head through opening of pull over shirt/dress, Pull shirt over trunk Upper body dressing/undressing: 4: Min-Patient completed 75 plus % of tasks FIM - Lower Body Dressing/Undressing Lower body dressing/undressing steps patient completed: Thread/unthread left pants leg Lower body dressing/undressing: 1: Two helpers  FIM - Toileting Toileting: 1: Two helpers  FIM - Radio producer Devices: Recruitment consultant Transfers: 2-From toilet/BSC: Max A (lift and lower assist), 2-To toilet/BSC: Max A (lift and lower assist)  FIM - Control and instrumentation engineer Devices: Arm rests, Bed rails Bed/Chair Transfer: 2: Supine > Sit: Max A (lifting assist/Pt. 25-49%), 2: Bed > Chair or W/C: Max A (lift and lower assist), 2: Chair or W/C > Bed: Max A (lift and lower assist)  FIM - Locomotion: Wheelchair Distance: 45 Locomotion: Wheelchair: 2: Travels 50 - 149 ft with minimal assistance (Pt.>75%) FIM - Locomotion: Ambulation Locomotion: Ambulation Assistive Devices: Other (comment) (hallway rail) Ambulation/Gait Assistance: 1: +2 Total assist, 2: Max assist Locomotion: Ambulation: 1: Two helpers  Comprehension Comprehension Mode: Auditory Comprehension: 5-Follows basic conversation/direction: With no assist  Expression Expression Mode: Verbal Expression: 4-Expresses basic 75 - 89% of the time/requires cueing 10 - 24% of the time. Needs helper  to occlude trach/needs to repeat words.  Social Interaction Social Interaction: 5-Interacts appropriately 90% of the time - Needs monitoring or encouragement for participation or interaction.  Problem Solving Problem Solving:  4-Solves basic 75 - 89% of the time/requires cueing 10 - 24% of the time  Memory Memory: 4-Recognizes or recalls 75 - 89% of the time/requires cueing 10 - 24% of the time   Medical Problem List and Plan: 1. Functional deficits secondary to left paramedian pontine infarct 2. DVT Prophylaxis/Anticoagulation: Pharmaceutical: Lovenox 3. Pain Management: N/A  -continue stretching, ROM for increased tone 4. Mood: LCSW to follow for evaluation and support.  5. Neuropsych: This patient is not capable of making decisions on his own behalf. 6. Skin/Wound Care:hx melanoma removal scalp, vaseline  - Routine pressure relief measures.   -Turn patient every 2 hours when in bed.  7. Fluids/Electrolytes/Nutrition: Monitor I/O. Check daily weights to monitor for signs of overload.  8. Acute renal insufficiency: Improved. Off HCTZ at this time.recheck BMET 10. HTN: Will monitor every 8 hours and allow permissive HTN for adequate perfusion. 11.  Leucocytosis: recheck Monday. afebrile   LOS (Days) 28 A FACE TO FACE EVALUATION WAS PERFORMED  Jaxden Blyden E 07/30/2014, 8:44 AM

## 2014-07-30 NOTE — Discharge Summary (Signed)
Physician Discharge Summary  Patient ID: [name] MRN: [id number] DOB/AGE: 1925-09-08 81 y.o.  Admit date: 07/02/2014 Discharge date: 08/01/13  Discharge Diagnoses:  Principal Problem:   Left pontine stroke Active Problems:   CAP (community acquired pneumonia)   Essential hypertension   Right hemiparesis   Dysphagia, post-stroke   Dysarthria due to recent cerebral infarction   Discharged Condition: Stable.   Significant Diagnostic Studies: Dg Chest 2 View  07/15/2014   CLINICAL DATA:  Persistent cough for 2 weeks, no chest complaints at this time. No shortness of breath.  EXAM: CHEST  2 VIEW  COMPARISON:  07/09/2014, 09/20/2009  FINDINGS: Bibasilar mild interstitial thickening which may reflect bibasilar pneumonia including aspiration pneumonia. There is no pleural effusion or pneumothorax. The heart and mediastinal contours are unremarkable.  There is mild thoracic spine spondylosis.  IMPRESSION: Bibasilar mild interstitial thickening which may reflect bibasilar pneumonia versus aspiration pneumonia.   Electronically Signed   By: Kathreen Devoid   On: 07/15/2014 09:49   Dg Chest 2 View  07/09/2014   CLINICAL DATA:  malaise for 1 week. Recent pneumonia. History of prostate carcinoma and hypertension  EXAM: CHEST  2 VIEW  COMPARISON:  July 02, 2014  FINDINGS: There is slightly less infiltrate in the right lower lobe posteriorly and medially compared to recent prior study. Moderate infiltrate remains in these areas, however. There is mild atelectatic change in the left base. There is apical pleural thickening bilaterally which is stable. No new opacity is seen. The heart size and pulmonary vascularity are normal. No adenopathy. There is degenerative change in the thoracic spine. There is slight anterior wedging of a mid thoracic vertebral body, stable. Atherosclerotic change in the aorta is again noted.  IMPRESSION: Slightly less infiltrate in the right lower lobe compared to recent  prior study. Stable mild atelectatic change in the left base as well as stable bilateral apical pleural thickening bilaterally. No new opacity.   Electronically Signed   By: Lowella Grip M.D.   On: 07/09/2014 11:09   Dg Chest 2 View  07/02/2014   CLINICAL DATA:  Acute shortness of breath for 5 days, pneumonia  EXAM: CHEST - 2 VIEW  COMPARISON:  06/28/2012  FINDINGS: Patchy right lower lobe airspace process compatible with pneumonia. Little interval change. Normal heart size and vascularity. Left lung remains clear. No developing effusion or pneumothorax. Apical scarring noted. Atherosclerotic calcifications of the aorta and subclavian vessels.  IMPRESSION: No significant change in right lower lobe airspace process compatible with pneumonia.   Electronically Signed   By: Daryll Brod M.D.   On: 07/02/2014 21:19    Labs:  Basic Metabolic Panel: BMET    Component Value Date/Time   NA 136 07/31/2014 0606   K 4.0 07/31/2014 0606   CL 108 07/31/2014 0606   CO2 25 07/31/2014 0606   GLUCOSE 96 07/31/2014 0606   BUN 13 07/31/2014 0606   CREATININE 0.87 07/31/2014 0606   CALCIUM 8.9 07/31/2014 0606   GFRNONAA 76* 07/31/2014 0606   GFRAA 88* 07/31/2014 0606     CBC:  Recent Labs Lab 07/30/14 0506  WBC 9.2  HGB 10.7*  HCT 32.7*  MCV 86.1  PLT 281    CBG: No results for input(s): GLUCAP in the last 168 hours.  Today's Vitals   07/31/14 0500 07/31/14 0910 07/31/14 1415 08/01/14 0554  BP:   134/55 147/73  Pulse:   91 70  Temp:   97.6 F (36.4 C) 98.7 F (37.1  C)  TempSrc:   Oral Oral  Resp:   18 17  Height:      Weight: 62.2 kg (137 lb 2 oz)   60.5 kg (133 lb 6.1 oz)  SpO2:   100% 99%  PainSc:  0-No pain     Brief HPI:   [name] is a 81 y.o. left handed male with history of HTN, prostate cancer, recent PNA with malaise X 1 week who was admitted on 06/27/14 with right sided weakness with right gaze preference and difficulty talking. Patient was noted to be in A fib  in setting of CAP and CT head without acute changes. EKG evaluated by cardiology and felt to have multiple artifacts with 1 degree AVB and repeat EKG with NSR. NIHSS- 14. 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  Neurology following for input and recommends ASA for thrombotic infarct due to small vessel disease.MBS revealed dysphagia and he was started on dysphagia 1 diet with nectar liquids but continues to have coughing episodes due to difficulty handling secretions. He was placed on Augmentin for CAP v/s aspiration and dehydration has resolved past aggressive hydration. Patient with subsequent right hemiparesis with sensory deficits, left inattention, dysphagia, severe dysarthria as well as cognitive linguistic deficits. CIR was recommended for follow up therapy.     Hospital Course: ANGELO PRINDLE was admitted to rehab 07/02/2014 for inpatient therapies to consist of PT, ST and OT at least three hours five days a week. Past admission physiatrist, therapy team and rehab RN have worked together to provide customized collaborative inpatient rehab. Respiratory status has been stable and leucocytosis has resolved. He was treated with two week course of augment for PNA. Diarrhea due to antibiotics has resolved and stool was negative for c diff.  Blood pressures have been monitored on tid basis and have been controlled off HCTZ. He has required IVF at night for hydration due to poor po intake as well as dislike of nectar liquids. No signs of overload noted. Family as well as staff have worked with patient on intake with nutritional supplements offered in between meals. Speech therapy has been following for dysphagia treatment and water protocol was initiated on 07/25/14. He requires moderate verbal cues for  recall of aspiration precautions and needs max to min multimodal cues for utilization of safe swallow strategies. IVF were discontinued on 01/04 and follow up labs reveal stable renal status.   Baclofen was added to help with hiccups which have resolved. Spasticity RUE/RLE is being managed with ROM as well as low dose baclofen.  Right neck and shoulder pain have improved with positioning of RUE as well as prn use of tylenol. He is unable to tolerate anything stronger due to SE. Area on scalp from recent biopsy is clean and dry. Vaseline is being applied on bid basis to keep skin moisturized. He is now able to tolerate oral secretions without difficulty and endurance is slowly improving. He continues to have poor postural reflexes with pusher tendencies to the right.  He continues to require significant amount of assistance and family has elected on futher therapy at SNF. He was discharged to [facility] on 08/01/14.     Rehab course: During patient's stay in rehab weekly team conferences were held to monitor patient's progress, set goals and discuss barriers to discharge. At admission, patient  required +2 total assistance with mobility and total to max assist with ADL tasks. He has had improvement in activity tolerance, balance, postural control and is showing improvement in ability to compensate for his deficits. He requires moderate assist for bathing as well as upper body dressing and max assist with lower body dressing as well as toileting. He is able to propel his wheelchair for 50' in controlled environment with left hemi technique with min assist and tactile cues for LLE weight bearing. He's requires moderate assist for squat pivot with heavy pushing towards the right. he is able to ambulate 40'  with max assist at hallway rail with assistance to block right knee for stability as well as advance right knee.    Disposition:  Skilled Nursing facility.   Diet:  Dysphagia 3, nectar liquids.   Special  Instructions: 1. Monitor fluid intake. Recheck lytes every 5 days to monitor hydration status. 2. Full supervision at meals. Medications whole in puree. Small bites and sips--check for pocketing. Oral care after po intake.  3. Water protocol between meals with full supervision.   4. Check weight every other day.     Medication List    STOP taking these medications        hydrochlorothiazide 25 MG tablet  Commonly known as:  HYDRODIURIL      TAKE these medications        acetaminophen 325 MG tablet  Commonly known as:  TYLENOL  Take 2 tablets (650 mg total) by mouth every 6 (six) hours as needed for mild pain.     aspirin 325 MG tablet  Take 1 tablet (325 mg total) by mouth daily.     atorvastatin 20 MG tablet  Commonly known as:  LIPITOR  Take 1 tablet (20 mg total) by mouth daily at 6 PM.     baclofen 5 mg Tabs tablet  Commonly known as:  LIORESAL  Take 0.5 tablets (5 mg total) by mouth 3 (three) times daily as needed (hiccups).     feeding supplement (ENSURE) Pudg  Take 1 Container by mouth 3 (three) times daily with meals.     ipratropium-albuterol 0.5-2.5 (3) MG/3ML Soln  Commonly known as:  DUONEB  Take 3 mLs by nebulization every 4 (four) hours as needed.     pantoprazole 40 MG tablet  Commonly known as:  PROTONIX  Take 1 tablet (40 mg total) by mouth daily.     polycarbophil 625 MG tablet  Commonly known as:  FIBERCON  Take 1 tablet (625 mg total) by mouth 2 (two) times daily.     saccharomyces boulardii 250 MG capsule  Commonly known as:  FLORASTOR  Take 1 capsule (250 mg total) by mouth 2 (two) times daily.     senna-docusate 8.6-50 MG per tablet  Commonly known as:  Senokot-S  Take 1 tablet by mouth at bedtime as needed for mild constipation.     white petrolatum Gel  Commonly known as:  VASELINE  Apply 1 application topically 2 (two) times daily.       Follow-up Information    Follow up with Charlett Blake, MD On 08/24/2014.   Specialty:   Physical Medicine and Rehabilitation   Why:  Be there at  10 for 10:30 am appointment   Contact information:   [address] [place] [address] [phone]       Follow up with Antony Contras, MD. Call today.   Specialties:  Neurology, Radiology   Why:  for follow up appointment in 4 weeks.   Contact information:   [address] [place] [address] [phone]       Signed: Bary Leriche 08/01/2014, 9:47 AM

## 2014-07-31 ENCOUNTER — Ambulatory Visit (HOSPITAL_COMMUNITY): Payer: Medicare Other | Admitting: Occupational Therapy

## 2014-07-31 ENCOUNTER — Inpatient Hospital Stay (HOSPITAL_COMMUNITY): Payer: Medicare Other | Admitting: Speech Pathology

## 2014-07-31 ENCOUNTER — Inpatient Hospital Stay (HOSPITAL_COMMUNITY): Payer: Medicare Other

## 2014-07-31 LAB — BASIC METABOLIC PANEL
ANION GAP: 3 — AB (ref 5–15)
BUN: 13 mg/dL (ref 6–23)
CHLORIDE: 108 meq/L (ref 96–112)
CO2: 25 mmol/L (ref 19–32)
Calcium: 8.9 mg/dL (ref 8.4–10.5)
Creatinine, Ser: 0.87 mg/dL (ref 0.50–1.35)
GFR calc Af Amer: 88 mL/min — ABNORMAL LOW (ref >90)
GFR calc non Af Amer: 76 mL/min — ABNORMAL LOW (ref >90)
GLUCOSE: 96 mg/dL (ref 70–99)
POTASSIUM: 4 mmol/L (ref 3.5–5.1)
Sodium: 136 mmol/L (ref 135–145)

## 2014-07-31 NOTE — Progress Notes (Signed)
NUTRITION FOLLOW UP  INTERVENTION: Continue Magic cup TID between meals, each supplement provides 290 kcal and 9 grams of protein. Ordered.  Continue Ensure Pudding po TID, each supplement provides 170 kcal and 4 grams of protein.  Encourage adequate PO intake.   NUTRITION DIAGNOSIS: Increased nutrient needs related to chronic illness as evidenced by estimated nutrition needs; ongoing  Goal: Pt to meet >/= 90% of their estimated nutrition needs; met  Monitor:  PO intake, weight trends, labs, I/O's  81 y.o. male  Admitting Dx: Left pontine stroke  ASSESSMENT: Pt with history of HTN, prostate cancer, recent PNA with malaise X 1 week who was admitted on 06/27/14 with right sided weakness with right gaze preference and difficulty talking. Patient was noted to be in A fib in setting of CAP. EKG evaluated by cardiology and felt to have multiple artifacts with 1 degree AVB and repeat EKG with NSR.   Meal completion has been 75-100%. PO intake improved and appetite is well. Pt has been taking his Ensure pudding. Will continue with current orders. Continue encouraging pt to eat his food at meals and to take his supplements.   Height: Ht Readings from Last 1 Encounters:  07/03/14 5' 9" (1.753 m)    Weight: Wt Readings from Last 1 Encounters:  07/31/14 137 lb 2 oz (62.2 kg)    BMI:  Body mass index is 20.24 kg/(m^2).  Re-Estimated Nutritional Needs: Kcal: 1750-1950 Protein: 80-90 grams Fluid: 1.75- 1.95 L/day  Skin: wound on head, +1 LUE and RUE, +1 RLE edema  Diet Order: DIET DYS 3 with nectar thick liquids   Intake/Output Summary (Last 24 hours) at 07/31/14 1416 Last data filed at 07/31/14 1300  Gross per 24 hour  Intake    720 ml  Output    250 ml  Net    470 ml    Last BM: 1/4  Labs:   Recent Labs Lab 07/27/14 0720 07/31/14 0606  NA  --  136  K  --  4.0  CL  --  108  CO2  --  25  BUN  --  13  CREATININE 0.76 0.87  CALCIUM  --  8.9  GLUCOSE  --  96     CBG (last 3)  No results for input(s): GLUCAP in the last 72 hours.  Scheduled Meds: . antiseptic oral rinse  7 mL Mouth Rinse QID  . aspirin  325 mg Oral Daily  . atorvastatin  20 mg Oral q1800  . enoxaparin (LOVENOX) injection  40 mg Subcutaneous Q24H  . feeding supplement (ENSURE)  1 Container Oral TID WC  . pantoprazole  40 mg Oral Daily  . polycarbophil  625 mg Oral BID  . saccharomyces boulardii  250 mg Oral BID  . white petrolatum   Topical BID    Continuous Infusions:    Past Medical History  Diagnosis Date  . Hypertension   . Cancer     Past Surgical History  Procedure Laterality Date  . Appendectomy    . Kidney surgery       La, MS, RD, LDN Pager # [phone] After hours/ weekend pager # [phone]  

## 2014-07-31 NOTE — Progress Notes (Signed)
Physical Therapy Session Note  Patient Details  Name: RHILEY SOLEM MRN: [id number] Date of Birth: October 18, 1925  Today's Date: 07/31/2014 PT Individual Time: 1300-1400 PT Individual Time Calculation (min): 60 min   Short Term Goals: Week 4:  PT Short Term Goal 1 (Week 4): =LTG's (downgraded due to lack of progress and probable D/C SNF)  Skilled Therapeutic Interventions/Progress Updates:    Pt received seated in w/c accompanied by wife; agreeable to therapy. Donned R AFO and bilat shoes with total A for time management. Pt performed w/c mobility x50' in controlled environment with L hemi technique and min A, tactile cueing at L knee for weight bearing. In treatment gym, pt performed sit > supine with mod A for LLE management and controlled trunk descent; supine>sit with mod A to lift trunk, initiate LLE advancement toward EOM; verbal/tactile cueing for LUE movement across midline. See below (therapeutic exercises) for detailed description of stretches performed for improved postural alignment during functional mobility. Pt performed gait x40' in controlled environment with hemi walker and max A overall; verbal cueing for postural alignment/control (due to posterior preference), manual facilitation for lateral weight shift to L side, verbal cueing for sequencing. Of note, pt able to consistently perform RLE advancement without assist; however, pt did appear to benefit from tactile cueing at R hamstrings to initiate RLE step, to increase walking cadence during final 15' of gait trial. After brief seated rest break, pt negotiated 5 stairs with Total A (+2A for safety due to pt anxiety, fear of falling, and posterior preference) and manual facilitation of lateral weight shift to L side, tactile cueing at L knee for increased weight bearing during ascent, tactile cueing for LUE advancement of rail, manual facilitation for safe RLE placement on step (although pt able to consistently initiate RLE advancement).  See below for detailed description of NuStep performed during remainder of session. Session ended in pt room, where pt was left seated in w/c with quick release belt in place for safety and all needs within reach.  Therapy Documentation Precautions:  Precautions Precautions: Fall Precaution Comments: R hemiplegia, R gaze preference, pusher to the R Restrictions Weight Bearing Restrictions: No Pain: Pain Assessment Pain Assessment: No/denies pain Locomotion : Ambulation Ambulation/Gait Assistance: 2: Max Financial controller Distance: 50  Therapeutic Exercises: Semi reclined on mat table, performed prolonged passive stretch of bilat hamstrings, gluteus maximus x2 minutes per muscle group, bilaterally (x8 minutes total) to pt tolerance. Pt performed NuStep x4.5 minutes with LUE and bilat LE's for increased muscular endurance, functional activity tolerance.  See FIM for current functional status  Therapy/Group: Individual Therapy  Hobble, Malva Cogan 07/31/2014, 7:01 PM

## 2014-07-31 NOTE — Progress Notes (Signed)
Occupational Therapy Discharge Summary  Patient Details  Name: [name] MRN: [id number] Date of Birth: 1926/06/25  Today's Date: 07/31/2014    Patient has met 4 of 8 long term goals due to improved activity tolerance, improved balance, postural control, improved attention and improved awareness.  Patient to discharge at overall mod A-max A level.  Patient is discharging to SNF therefore no family training completed.     Reasons goals not met: Patient requires SBA-max A for sitting balance and max A for standing balance during self-care tasks.   Recommendation:  Patient will benefit from ongoing skilled OT services in skilled nursing facility setting to continue to advance functional skills in the area of BADL.  Equipment: No equipment provided Patient discharging to SNF  Reasons for discharge: treatment goals met and discharge from hospital  Patient/family agrees with progress made and goals achieved: Yes  OT Discharge Precautions/Restrictions  Precautions Precautions: Fall Precaution Comments: R hemiplegia, R gaze preference, pusher to the R Restrictions Weight Bearing Restrictions: No General   Vital Signs  Pain Pain Assessment Pain Assessment: No/denies pain ADL   Vision/Perception  Vision- History Baseline Vision/History: Wears glasses Wears Glasses: Reading only Patient Visual Report: No change from baseline Vision- Assessment Vision Assessment?: Yes Eye Alignment: Within Functional Limits Ocular Range of Motion: Within Functional Limits Alignment/Gaze Preference: Within Defined Limits Tracking/Visual Pursuits: Requires cues, head turns, or add eye shifts to track  Cognition Overall Cognitive Status: Impaired/Different from baseline Arousal/Alertness: Awake/alert Orientation Level: Oriented to person;Oriented to place;Oriented to situation;Oriented to time Attention: Sustained;Selective Sustained Attention: Appears intact Selective Attention:  Impaired Selective Attention Impairment: Functional complex Memory: Impaired Memory Impairment: Decreased recall of new information Problem Solving: Impaired Problem Solving Impairment: Functional basic Behaviors: Poor frustration tolerance Safety/Judgment: Appears intact Sensation Sensation Light Touch: Appears Intact Hot/Cold: Appears Intact Coordination Gross Motor Movements are Fluid and Coordinated: No Fine Motor Movements are Fluid and Coordinated: No Coordination and Movement Description: Impaired due to dense L hemiplegia Finger Nose Finger Test: unable to complete with L (dense hemiparesis) Motor  Motor Motor: Hemiplegia;Abnormal postural alignment and control Mobility  Bed Mobility Bed Mobility: Supine to Sit;Sit to Supine Supine to Sit: 3: Mod assist Supine to Sit Details: Verbal cues for sequencing;Verbal cues for technique;Verbal cues for precautions/safety;Manual facilitation for weight bearing;Manual facilitation for weight shifting;Manual facilitation for placement Sit to Supine: 3: Mod assist Sit to Supine - Details: Verbal cues for sequencing;Verbal cues for technique;Verbal cues for precautions/safety;Manual facilitation for weight shifting;Manual facilitation for weight bearing;Manual facilitation for placement Transfers Transfers: Sit to Stand;Stand to Sit Sit to Stand: 2: Max assist Sit to Stand Details: Verbal cues for sequencing;Verbal cues for technique;Verbal cues for precautions/safety;Manual facilitation for weight shifting;Manual facilitation for weight bearing Stand to Sit: 2: Max assist Stand to Sit Details (indicate cue type and reason): Verbal cues for sequencing;Verbal cues for technique;Verbal cues for precautions/safety;Manual facilitation for weight shifting;Manual facilitation for weight bearing  Trunk/Postural Assessment  Cervical Assessment Cervical Assessment: Exceptions to [facility] (forward head) Thoracic Assessment Thoracic Assessment:  Exceptions to [facility] (kyphotic; right lateral lean) Lumbar Assessment Lumbar Assessment: Exceptions to [facility] (posterior pelvic tilt) Postural Control Postural Control: Deficits on evaluation Righting Reactions: delayed Protective Responses: delayed  Balance Balance Balance Assessed: Yes Static Sitting Balance Static Sitting - Balance Support: Feet supported;Left upper extremity supported Static Sitting - Level of Assistance: 4: Min assist Dynamic Sitting Balance Dynamic Sitting - Balance Support: Feet supported;Left upper extremity supported Dynamic Sitting - Level of Assistance: 4: Min assist;3:  Mod assist Dynamic Sitting - Balance Activities: Lateral lean/weight shifting;Forward lean/weight shifting Static Standing Balance Static Standing - Balance Support: Left upper extremity supported Static Standing - Level of Assistance: 2: Max assist Extremity/Trunk Assessment RUE Assessment RUE Assessment: Exceptions to [facility] (trace shoulder adduction and elbow extension) RUE PROM (degrees) Overall PROM Right Upper Extremity: Within functional limits for tasks performed RUE Overall PROM Comments: full PROM; dense hemiparesis RUE Tone RUE Tone: Modified Ashworth Modified Ashworth Scale for Grading Hypertonia RUE: Slight increase in muscle tone, manifested by a catch and release or by minimal resistance at the end of the range of motion when the affected part(s) is moved in flexion or extension LUE Assessment LUE Assessment: Within Functional Limits (4/5 strength)  See FIM for current functional status  Ellery Meroney N 07/31/2014, 1:13 PM

## 2014-07-31 NOTE — Progress Notes (Signed)
Occupational Therapy Session Note  Patient Details  Name: [name] MRN: [id number] Date of Birth: Mar 21, 1926  Today's Date: 07/31/2014 OT Individual Time: 5638-9373 OT Individual Time Calculation (min): 60 min    Short Term Goals: Week 4:  OT Short Term Goal 1 (Week 4): STGs= LTGs secondary to estimated upcoming discharge date  Skilled Therapeutic Interventions/Progress Updates:    Pt seen for ADL retraining with focus on postural control in sitting and standing, standing balance, and functional tranfers. Pt received sitting in w/c. Completed squat pivot transfer w/c<>toilet (elevated toilet seat) with max assist and max cues for sequencing. Pt required total assist for hygiene while standing with max assist approx 30 seconds 2x. Completed bathing at sink with min cues for sequencing. Required max assist for standing balance during clothing management around waist. Engaged in scooting in w/c with min-mod assist and max cues for sequencing. Pt left sitting in w/c with all needs in reach and wife present. No questions at this time about discharge.   Therapy Documentation Precautions:  Precautions Precautions: Fall Precaution Comments: R hemiplegia, R gaze preference, pusher to the R Restrictions Weight Bearing Restrictions: No General:   Vital Signs:  Pain: No report of pain during therapy session  See FIM for current functional status  Therapy/Group: Individual Therapy  Duayne Cal 07/31/2014, 12:24 PM

## 2014-07-31 NOTE — Progress Notes (Signed)
Social Work Patient ID: [name], male   DOB: 01-15-1926, 81 y.o.   MRN: [id number] Met with pt and wife to discuss the plan for tomorrow, wife wants to talk with Pam-PA regarding medical questions she has along with their daughter when here at [place]. Plan to transfer to [facility] around 12;30 via ambulance.  All agreeable to the plan and will gather paperwork and work toward transfer tomorrow.

## 2014-07-31 NOTE — Plan of Care (Signed)
Problem: RH Balance Goal: LTG: Patient will maintain dynamic sitting balance (OT) LTG: Patient will maintain dynamic sitting balance with assistance during activities of daily living (OT)  Outcome: Not Met (add Reason) Not met as pt requires max A at times during LOB in sitting Goal: LTG Patient will maintain dynamic standing with ADLs (OT) LTG: Patient will maintain dynamic standing balance with assist during activities of daily living (OT)  Outcome: Not Met (add Reason) Pt requires max A for standing balance  Problem: RH Toileting Goal: LTG Patient will perform toileting w/assist, cues/equip (OT) LTG: Patient will perform toiletiing (clothes management/hygiene) with assist, with/without cues using equipment (OT)  Outcome: Not Met (add Reason) Pt requires total assist for toilet task  Problem: RH Tub/Shower Transfers Goal: LTG Patient will perform tub/shower transfers w/assist (OT) LTG: Patient will perform tub/shower transfers with assist, with/without cues using equipment (OT)  Outcome: Not Met (add Reason) Pt requires max A functional transfers

## 2014-07-31 NOTE — Progress Notes (Signed)
Speech Language Pathology Discharge Summary  Patient Details  Name: [name] MRN: [id number] Date of Birth: 1926/03/23  Today's Date: 07/31/2014 SLP Individual Time: 1000-1100 SLP Individual Time Calculation (min): 60 min   Skilled Therapeutic Interventions:  Skilled treatment session focused on addressing dysphagia and dysarthria goals as well as wrap up of patient/family education.  SLP facilitated session Supervision cues to promote consistent carryover of speech intelligibility strategies, which wife was abel to return demonstration of.  SLP also facilitated session with Mod faded to Min multimodal cues to recall and carryover safe swallow strategies with water via straw with cough x2 resulting from large bolus size x1 and incomplete chin tuck x1.  Given need for consistent cues recommend next level of care continue addressing this in hopes of transition to thin liquids with meals and use of swallow strategies; patient and wife in agreement.  Wife Mod I for recall of diet restrictions and recommended pharyngeal strengthening exercise and patient required Min question cues to accurately recall previously taught information.  Patient and wife ready for discharge to next level of care.   Patient has met 7 of 7 long term goals.  Patient to discharge at overall Supervision;Min;Mod level.  Reasons goals not met: n/a   Clinical Impression/Discharge Summary: Patient has made functional gains and has met 7 out of 7 long term goals this reporting period due to diet advancement, increased speech intelligibility, awareness of deficits and ability to solve new basic problems related to deficits as well as recall of new information.  Patient continues to require overall Supervision assist to use speech strategies, Min assist to use safe swallow strategies and Mod assist to recall new information; however, with repetition clinician able to fade cues to Min assist.  Currently, patient is consuming Dys.3  textures with nectar-thick liquids and is participating in the [employer] Protocol with small single straw sips to promote head down posture with 2 swallows per sips and intermittent throat clear following most recent objective assessment.  SLP has been working on carryover of these strategies prior to initiation of thin liquids with these recommendations during a meal.  Of note, patient also demonstrates intermittent overt s/s of aspiration.  Patient and family education has been completed with spouse.  Patient would benefit from continued skilled SLP intervention at the next level of care to maximize overall functional independence in order to further reduce burden of care.  Care Partner:  Caregiver Able to Provide Assistance: No  Type of Caregiver Assistance: Physical  Recommendation:  24 hour supervision/assistance;Skilled Nursing facility  Rationale for SLP Follow Up: Maximize swallowing safety;Maximize functional communication;Maximize cognitive function and independence;Reduce caregiver burden   Equipment: none   Reasons for discharge: Treatment goals met;Discharged from hospital   Patient/Family Agrees with Progress Made and Goals Achieved: Yes   See FIM for current functional status  Carmelia Roller., CCC-SLP [phone]  [place] 08/01/2014, 2:08 PM

## 2014-07-31 NOTE — Progress Notes (Signed)
Subjective/Complaints:  I am going to [employer] tomorrow No pain c/os Review of Systems - Negative except weakness on right side Objective: Vital Signs: Blood pressure 154/72, pulse 68, temperature 97.8 F (36.6 C), temperature source Oral, resp. rate 18, height 5' 9"  (1.753 m), weight 62.2 kg (137 lb 2 oz), SpO2 97 %. No results found. Results for orders placed or performed during the hospital encounter of 07/02/14 (from the past 72 hour(s))  CBC     Status: Abnormal   Collection Time: 07/30/14  5:06 AM  Result Value Ref Range   WBC 9.2 4.0 - 10.5 K/uL   RBC 3.80 (L) 4.22 - 5.81 MIL/uL   Hemoglobin 10.7 (L) 13.0 - 17.0 g/dL   HCT 32.7 (L) 39.0 - 52.0 %   MCV 86.1 78.0 - 100.0 fL   MCH 28.2 26.0 - 34.0 pg   MCHC 32.7 30.0 - 36.0 g/dL   RDW 14.1 11.5 - 15.5 %   Platelets 281 150 - 400 K/uL  Basic metabolic panel     Status: Abnormal   Collection Time: 07/31/14  6:06 AM  Result Value Ref Range   Sodium 136 135 - 145 mmol/L    Comment: Please note change in reference range.   Potassium 4.0 3.5 - 5.1 mmol/L    Comment: Please note change in reference range.   Chloride 108 96 - 112 mEq/L   CO2 25 19 - 32 mmol/L   Glucose, Bld 96 70 - 99 mg/dL   BUN 13 6 - 23 mg/dL   Creatinine, Ser 0.87 0.50 - 1.35 mg/dL   Calcium 8.9 8.4 - 10.5 mg/dL   GFR calc non Af Amer 76 (L) >90 mL/min   GFR calc Af Amer 88 (L) >90 mL/min    Comment: (NOTE) The eGFR has been calculated using the CKD EPI equation. This calculation has not been validated in all clinical situations. eGFR's persistently <90 mL/min signify possible Chronic Kidney Disease.    Anion gap 3 (L) 5 - 15     HEENT: normal, no evidence of thrush Cardio: RRR Resp: CTA B/L, no resp distress GI: BS positive and NT,ND Extremity:  Edema Right hand and Right foot Skin:   Wound vertex of scalp, eschar Neuro: Lethargic, Cranial Nerve Abnormalities Right central 7,  Abnormal Motor 0/5 on right UE except trace at finger and wrist  flexors , 3- Right hip extensor synergy  2-Right foot plantar flexion and Tone:  Emerging tone RLE---1/4. DTR's 3+.  Left side 5/5 in UE and LE Musc/Skel:  Other no pain with Right shoulder ROM. Right heel cord tight Gen NAD   Assessment/Plan: 1. Functional deficits secondary to left paramedian pontine infarct Right hemiparesis which require 3+ hours per day of interdisciplinary therapy in a comprehensive inpatient rehab setting. Physiatrist is providing close team supervision and 24 hour management of active medical problems listed below. Physiatrist and rehab team continue to assess barriers to discharge/monitor patient progress toward functional and medical goals.  To SNF in am  .FIM: FIM - Bathing Bathing Steps Patient Completed: Chest, Right Arm, Abdomen, Left upper leg, Right upper leg, Front perineal area Bathing: 2: Max-Patient completes 3-4 48f10 parts or 25-49% (max A sit<>stand)  FIM - Upper Body Dressing/Undressing Upper body dressing/undressing steps patient completed: Thread/unthread left sleeve of pullover shirt/dress, Pull shirt over trunk Upper body dressing/undressing: 3: Mod-Patient completed 50-74% of tasks FIM - Lower Body Dressing/Undressing Lower body dressing/undressing steps patient completed: Thread/unthread left pants leg Lower body  dressing/undressing: 1: Two helpers  FIM - Toileting Toileting: 1: Two helpers  FIM - Radio producer Devices: Recruitment consultant Transfers: 2-From toilet/BSC: Max A (lift and lower assist), 2-To toilet/BSC: Max A (lift and lower assist)  FIM - Control and instrumentation engineer Devices: Arm rests, Bed rails Bed/Chair Transfer: 2: Supine > Sit: Max A (lifting assist/Pt. 25-49%), 2: Bed > Chair or W/C: Max A (lift and lower assist)  FIM - Locomotion: Wheelchair Distance: 50 Locomotion: Wheelchair: 2: Travels 50 - 149 ft with minimal assistance (Pt.>75%) FIM - Locomotion:  Ambulation Locomotion: Ambulation Assistive Devices: Other (comment), Walker - Hemi Ambulation/Gait Assistance: 2: Max assist Locomotion: Ambulation: 1: Travels less than 50 ft with supervision/safety issues  Comprehension Comprehension Mode: Auditory Comprehension: 5-Understands basic 90% of the time/requires cueing < 10% of the time  Expression Expression Mode: Verbal Expression: 4-Expresses basic 75 - 89% of the time/requires cueing 10 - 24% of the time. Needs helper to occlude trach/needs to repeat words.  Social Interaction Social Interaction: 4-Interacts appropriately 75 - 89% of the time - Needs redirection for appropriate language or to initiate interaction.  Problem Solving Problem Solving: 4-Solves basic 75 - 89% of the time/requires cueing 10 - 24% of the time  Memory Memory: 3-Recognizes or recalls 50 - 74% of the time/requires cueing 25 - 49% of the time   Medical Problem List and Plan: 1. Functional deficits secondary to left paramedian pontine infarct 2. DVT Prophylaxis/Anticoagulation: Pharmaceutical: Lovenox 3. Pain Management: N/A  -continue stretching, ROM for increased tone 4. Mood: LCSW to follow for evaluation and support.  5. Neuropsych: This patient is not capable of making decisions on his own behalf. 6. Skin/Wound Care:hx melanoma removal scalp, vaseline  - Routine pressure relief measures.   -Turn patient every 2 hours when in bed.  7. Fluids/Electrolytes/Nutrition: Monitor I/O. Check daily weights to monitor for signs of overload.  8. Acute renal insufficiency: Improved. Off HCTZ at this time.recheck BMET 10. HTN: Will monitor every 8 hours and allow permissive HTN for adequate perfusion. 11.  Leucocytosis: recheck Monday. afebrile   LOS (Days) 29 A FACE TO FACE EVALUATION WAS PERFORMED  KIRSTEINS,ANDREW E 07/31/2014, 7:37 AM

## 2014-08-01 DIAGNOSIS — E559 Vitamin D deficiency, unspecified: Secondary | ICD-10-CM | POA: Diagnosis not present

## 2014-08-01 DIAGNOSIS — M62838 Other muscle spasm: Secondary | ICD-10-CM | POA: Diagnosis not present

## 2014-08-01 DIAGNOSIS — J189 Pneumonia, unspecified organism: Secondary | ICD-10-CM | POA: Diagnosis not present

## 2014-08-01 DIAGNOSIS — R400 Somnolence: Secondary | ICD-10-CM | POA: Diagnosis not present

## 2014-08-01 DIAGNOSIS — I69322 Dysarthria following cerebral infarction: Secondary | ICD-10-CM | POA: Diagnosis not present

## 2014-08-01 DIAGNOSIS — Z8546 Personal history of malignant neoplasm of prostate: Secondary | ICD-10-CM | POA: Diagnosis not present

## 2014-08-01 DIAGNOSIS — F329 Major depressive disorder, single episode, unspecified: Secondary | ICD-10-CM | POA: Diagnosis present

## 2014-08-01 DIAGNOSIS — I6789 Other cerebrovascular disease: Secondary | ICD-10-CM | POA: Diagnosis not present

## 2014-08-01 DIAGNOSIS — R4701 Aphasia: Secondary | ICD-10-CM | POA: Diagnosis not present

## 2014-08-01 DIAGNOSIS — M62830 Muscle spasm of back: Secondary | ICD-10-CM | POA: Diagnosis not present

## 2014-08-01 DIAGNOSIS — Z87891 Personal history of nicotine dependence: Secondary | ICD-10-CM | POA: Diagnosis not present

## 2014-08-01 DIAGNOSIS — I6350 Cerebral infarction due to unspecified occlusion or stenosis of unspecified cerebral artery: Secondary | ICD-10-CM | POA: Diagnosis not present

## 2014-08-01 DIAGNOSIS — R7989 Other specified abnormal findings of blood chemistry: Secondary | ICD-10-CM | POA: Diagnosis not present

## 2014-08-01 DIAGNOSIS — D649 Anemia, unspecified: Secondary | ICD-10-CM | POA: Diagnosis present

## 2014-08-01 DIAGNOSIS — R1319 Other dysphagia: Secondary | ICD-10-CM | POA: Diagnosis not present

## 2014-08-01 DIAGNOSIS — I69398 Other sequelae of cerebral infarction: Secondary | ICD-10-CM | POA: Diagnosis not present

## 2014-08-01 DIAGNOSIS — I4510 Unspecified right bundle-branch block: Secondary | ICD-10-CM | POA: Diagnosis not present

## 2014-08-01 DIAGNOSIS — Z8673 Personal history of transient ischemic attack (TIA), and cerebral infarction without residual deficits: Secondary | ICD-10-CM | POA: Diagnosis not present

## 2014-08-01 DIAGNOSIS — R410 Disorientation, unspecified: Secondary | ICD-10-CM | POA: Diagnosis not present

## 2014-08-01 DIAGNOSIS — I69959 Hemiplegia and hemiparesis following unspecified cerebrovascular disease affecting unspecified side: Secondary | ICD-10-CM | POA: Diagnosis not present

## 2014-08-01 DIAGNOSIS — I69391 Dysphagia following cerebral infarction: Secondary | ICD-10-CM | POA: Diagnosis not present

## 2014-08-01 DIAGNOSIS — R1312 Dysphagia, oropharyngeal phase: Secondary | ICD-10-CM | POA: Diagnosis not present

## 2014-08-01 DIAGNOSIS — G8190 Hemiplegia, unspecified affecting unspecified side: Secondary | ICD-10-CM | POA: Diagnosis not present

## 2014-08-01 DIAGNOSIS — I69351 Hemiplegia and hemiparesis following cerebral infarction affecting right dominant side: Secondary | ICD-10-CM | POA: Diagnosis not present

## 2014-08-01 DIAGNOSIS — R4182 Altered mental status, unspecified: Secondary | ICD-10-CM | POA: Diagnosis not present

## 2014-08-01 DIAGNOSIS — R2689 Other abnormalities of gait and mobility: Secondary | ICD-10-CM | POA: Diagnosis not present

## 2014-08-01 DIAGNOSIS — G9340 Encephalopathy, unspecified: Secondary | ICD-10-CM | POA: Diagnosis present

## 2014-08-01 DIAGNOSIS — K219 Gastro-esophageal reflux disease without esophagitis: Secondary | ICD-10-CM | POA: Diagnosis not present

## 2014-08-01 DIAGNOSIS — I639 Cerebral infarction, unspecified: Secondary | ICD-10-CM | POA: Diagnosis not present

## 2014-08-01 DIAGNOSIS — E785 Hyperlipidemia, unspecified: Secondary | ICD-10-CM | POA: Diagnosis not present

## 2014-08-01 DIAGNOSIS — E860 Dehydration: Secondary | ICD-10-CM | POA: Diagnosis present

## 2014-08-01 DIAGNOSIS — R531 Weakness: Secondary | ICD-10-CM | POA: Diagnosis present

## 2014-08-01 DIAGNOSIS — F4489 Other dissociative and conversion disorders: Secondary | ICD-10-CM | POA: Diagnosis not present

## 2014-08-01 DIAGNOSIS — N390 Urinary tract infection, site not specified: Secondary | ICD-10-CM | POA: Diagnosis present

## 2014-08-01 DIAGNOSIS — K625 Hemorrhage of anus and rectum: Secondary | ICD-10-CM | POA: Diagnosis not present

## 2014-08-01 DIAGNOSIS — I10 Essential (primary) hypertension: Secondary | ICD-10-CM | POA: Diagnosis present

## 2014-08-01 NOTE — Discharge Instructions (Signed)
Inpatient Rehab Discharge Instructions  [name] Discharge date and time:  08/01/13  Activities/Precautions/ Functional Status: Activity:  Activity as tolerated with assistance.   Diet:  Dysphagia 3 with nectar liquids.  Wound Care:   Functional status:  ___ No restrictions     ___ Walk up steps independently _x__ 24/7 supervision/assistance   ___ Walk up steps with assistance ___ Intermittent supervision/assistance  ___ Bathe/dress independently ___ Walk with walker     _X__ Bathe/dress with assistance ___ Walk Independently    ___ Shower independently ___ Walk with assistance    ___ Shower with assistance ___ No alcohol     ___ Return to work/school ________  Special Instructions:   STROKE/TIA DISCHARGE INSTRUCTIONS SMOKING Cigarette smoking nearly doubles your risk of having a stroke & is the single most alterable risk factor  If you smoke or have smoked in the last 12 months, you are advised to quit smoking for your health.  Most of the excess cardiovascular risk related to smoking disappears within a year of stopping.  Ask you doctor about anti-smoking medications  [facility] Quit Line: 1-800-QUIT NOW  Free Smoking Cessation Classes [phone]  CHOLESTEROL Know your levels; limit fat & cholesterol in your diet  Lipid Panel     Component Value Date/Time   CHOL 139 06/28/2014 0448   TRIG 108 06/28/2014 0448   HDL 23* 06/28/2014 0448   CHOLHDL 6.0 06/28/2014 0448   VLDL 22 06/28/2014 0448   LDLCALC 94 06/28/2014 0448      Many patients benefit from treatment even if their cholesterol is at goal.  Goal: Total Cholesterol (CHOL) less than 160  Goal:  Triglycerides (TRIG) less than 150  Goal:  HDL greater than 40  Goal:  LDL (LDLCALC) less than 100   BLOOD PRESSURE American Stroke Association blood pressure target is less that 120/80 mm/Hg  Your discharge blood pressure is:  BP: (!) 147/73 mmHg  Monitor your blood pressure  Limit your salt and alcohol  intake  Many individuals will require more than one medication for high blood pressure  DIABETES (A1c is a blood sugar average for last 3 months) Goal HGBA1c is under 7% (HBGA1c is blood sugar average for last 3 months)  Diabetes: No known diagnosis of diabetes    Lab Results  Component Value Date   HGBA1C 5.9* 06/28/2014     Your HGBA1c can be lowered with medications, healthy diet, and exercise.  Check your blood sugar as directed by your physician  Call your physician if you experience unexplained or low blood sugars.  PHYSICAL ACTIVITY/REHABILITATION Goal is 30 minutes at least 4 days per week  Activity: No driving, Therapies: See above Return to work: n/A  Activity decreases your risk of heart attack and stroke and makes your heart stronger.  It helps control your weight and blood pressure; helps you relax and can improve your mood.  Participate in a regular exercise program.  Talk with your doctor about the best form of exercise for you (dancing, walking, swimming, cycling).  DIET/WEIGHT Goal is to maintain a healthy weight  Your discharge diet is: DIET DYS 3     liquids Your height is:  Height: 5\' 9"  (175.3 cm) Your current weight is: Weight: 60.5 kg (133 lb 6.1 oz) Your Body Mass Index (BMI) is:  BMI (Calculated): 22.9  Following the type of diet specifically designed for you will help prevent another stroke.  Your goal weight is: 169 lbs  Your goal Body  Mass Index (BMI) is 19-24.  Healthy food habits can help reduce 3 risk factors for stroke:  High cholesterol, hypertension, and excess weight.  RESOURCES Stroke/Support Group:  Call [phone]   STROKE EDUCATION PROVIDED/REVIEWED AND GIVEN TO PATIENT Stroke warning signs and symptoms How to activate emergency medical system (call 911). Medications prescribed at discharge. Need for follow-up after discharge. Personal risk factors for stroke. Pneumonia vaccine given:  Flu vaccine given:  My questions have been  answered, the writing is legible, and I understand these instructions.  I will adhere to these goals & educational materials that have been provided to me after my discharge from the hospital.      My questions have been answered and I understand these instructions. I will adhere to these goals and the provided educational materials after my discharge from the hospital.  Patient/Caregiver Signature _______________________________ Date __________  Clinician Signature _______________________________________ Date __________  Please bring this form and your medication list with you to all your follow-up doctor's appointments.

## 2014-08-01 NOTE — Progress Notes (Signed)
Social Work Discharge Note Discharge Note  The overall goal for the admission was met for:   Discharge location: No-[facility]  Length of Stay: No-30 DAYS  Discharge activity level: Yes-MIN/MOD LEVEL  Home/community participation: Yes  Services provided included: MD, RD, PT, OT, SLP, RN, CM, TR, Pharmacy, Neuropsych and SW  Financial Services: Medicare and Private Insurance: [employer]  Follow-up services arranged: Other: SHORT TERM NHP  Comments (or additional information):FAMILY AND PT DECIDED SHORT TERM NHP BEST OPTION UNTIL HIGHER LEVEL TO RETURN HOME  Patient/Family verbalized understanding of follow-up arrangements: Yes  Individual responsible for coordination of the follow-up plan: SELF & AGNES-WIFE  Confirmed correct DME delivered: Elease Hashimoto 08/01/2014    Elease Hashimoto

## 2014-08-01 NOTE — Progress Notes (Addendum)
Subjective/Complaints:  Trying to drink more No pain c/os Occ coughReview of Systems - Negative except weakness on right side Objective: Vital Signs: Blood pressure 147/73, pulse 70, temperature 98.7 F (37.1 C), temperature source Oral, resp. rate 17, height 5' 9"  (1.753 m), weight 60.5 kg (133 lb 6.1 oz), SpO2 99 %. No results found. Results for orders placed or performed during the hospital encounter of 07/02/14 (from the past 72 hour(s))  CBC     Status: Abnormal   Collection Time: 07/30/14  5:06 AM  Result Value Ref Range   WBC 9.2 4.0 - 10.5 K/uL   RBC 3.80 (L) 4.22 - 5.81 MIL/uL   Hemoglobin 10.7 (L) 13.0 - 17.0 g/dL   HCT 32.7 (L) 39.0 - 52.0 %   MCV 86.1 78.0 - 100.0 fL   MCH 28.2 26.0 - 34.0 pg   MCHC 32.7 30.0 - 36.0 g/dL   RDW 14.1 11.5 - 15.5 %   Platelets 281 150 - 400 K/uL  Basic metabolic panel     Status: Abnormal   Collection Time: 07/31/14  6:06 AM  Result Value Ref Range   Sodium 136 135 - 145 mmol/L    Comment: Please note change in reference range.   Potassium 4.0 3.5 - 5.1 mmol/L    Comment: Please note change in reference range.   Chloride 108 96 - 112 mEq/L   CO2 25 19 - 32 mmol/L   Glucose, Bld 96 70 - 99 mg/dL   BUN 13 6 - 23 mg/dL   Creatinine, Ser 0.87 0.50 - 1.35 mg/dL   Calcium 8.9 8.4 - 10.5 mg/dL   GFR calc non Af Amer 76 (L) >90 mL/min   GFR calc Af Amer 88 (L) >90 mL/min    Comment: (NOTE) The eGFR has been calculated using the CKD EPI equation. This calculation has not been validated in all clinical situations. eGFR's persistently <90 mL/min signify possible Chronic Kidney Disease.    Anion gap 3 (L) 5 - 15     HEENT: normal, no evidence of thrush Cardio: RRR Resp: CTA B/L, no resp distress GI: BS positive and NT,ND Extremity:  Edema Right hand and Right foot Skin:   Wound vertex of scalp, eschar Neuro: Lethargic, Cranial Nerve Abnormalities Right central 7,  Abnormal Motor 0/5 on right UE except trace at finger and wrist  flexors , 3- Right hip extensor synergy  2-Right foot plantar flexion and Tone:  Emerging tone RLE---1/4. DTR's 3+.  Left side 5/5 in UE and LE Musc/Skel:  Other no pain with Right shoulder ROM. Right heel cord tight Gen NAD   Assessment/Plan: 1. Functional deficits secondary to left paramedian pontine infarct Right hemiparesis  Stable for D/C to SNF .FIM: FIM - Bathing Bathing Steps Patient Completed: Chest, Right Arm, Abdomen, Left upper leg, Right upper leg, Front perineal area Bathing: 3: Mod-Patient completes 5-7 [id number] parts or 50-74%  FIM - Upper Body Dressing/Undressing Upper body dressing/undressing steps patient completed: Thread/unthread left sleeve of pullover shirt/dress, Pull shirt over trunk Upper body dressing/undressing: 3: Mod-Patient completed 50-74% of tasks FIM - Lower Body Dressing/Undressing Lower body dressing/undressing steps patient completed: Thread/unthread left pants leg, Thread/unthread left underwear leg Lower body dressing/undressing: 2: Max-Patient completed 25-49% of tasks  FIM - Toileting Toileting: 1: Total-Patient completed zero steps, helper did all 3  FIM - TRadio producerDevices: Elevated toilet seat, Grab bars Toilet Transfers: 2-From toilet/BSC: Max A (lift and lower assist), 2-To toilet/BSC: Max  A (lift and lower assist)  FIM - Bed/Chair Transfer Bed/Chair Transfer Assistive Devices: Arm rests Bed/Chair Transfer: 3: Supine > Sit: Mod A (lifting assist/Pt. 50-74%/lift 2 legs, 3: Bed > Chair or W/C: Mod A (lift or lower assist), 3: Chair or W/C > Bed: Mod A (lift or lower assist), 3: Sit > Supine: Mod A (lifting assist/Pt. 50-74%/lift 2 legs)  FIM - Locomotion: Wheelchair Distance: 50 Locomotion: Wheelchair: 2: Travels 50 - 149 ft with minimal assistance (Pt.>75%) FIM - Locomotion: Ambulation Locomotion: Ambulation Assistive Devices: Walker - Hemi, Orthosis (R AFO) Ambulation/Gait Assistance: 2: Max  assist Locomotion: Ambulation: 1: Travels less than 50 ft with supervision/safety issues  Comprehension Comprehension Mode: Auditory Comprehension: 5-Understands basic 90% of the time/requires cueing < 10% of the time  Expression Expression Mode: Verbal Expression: 5-Expresses basic 90% of the time/requires cueing < 10% of the time.  Social Interaction Social Interaction: 4-Interacts appropriately 75 - 89% of the time - Needs redirection for appropriate language or to initiate interaction.  Problem Solving Problem Solving: 4-Solves basic 75 - 89% of the time/requires cueing 10 - 24% of the time  Memory Memory: 4-Recognizes or recalls 75 - 89% of the time/requires cueing 10 - 24% of the time   Medical Problem List and Plan: 1. Functional deficits secondary to left paramedian pontine infarct 2. DVT Prophylaxis/Anticoagulation: Pharmaceutical: Lovenox 3. Pain Management: N/A  -continue stretching, ROM for increased tone 4. Mood: LCSW to follow for evaluation and support.  5. Neuropsych: This patient is not capable of making decisions on his own behalf. 6. Skin/Wound Care:hx melanoma removal scalp, vaseline  - Routine pressure relief measures.   -Turn patient every 2 hours when in bed.  7. Fluids/Electrolytes/Nutrition: Monitor I/O. Check daily weights to monitor for signs of overload.  8. Acute renal insufficiency: Improved. Off HCTZ at this time.recheck BMET 10. HTN: Will monitor every 8 hours and allow permissive HTN for adequate perfusion.    LOS (Days) [place]  Devyn Sheerin E 08/01/2014, 9:01 AM

## 2014-08-02 NOTE — Progress Notes (Signed)
Recreational Therapy Discharge Summary Patient Details  Name: [name] MRN: [id number] Date of Birth: 11/24/1925 Today's Date: 08/02/2014  Long term goals set: 1  Long term goals met: 1  Comments on progress toward goals: Pt is being discharged to SNF for continued therapies and 24 hour assistance.  Pt with limited participation in TR specific sessions as focus has been primarily on balance during co-treats with PT.  Pt is able to complete simple tasks seated w/c level with set-up assist-min assist.  Pt's family has been present & involved in therapy sessions throughout LOS and supportive of pt & his recovery.   Reasons for discharge: discharge from hospital  Patient/family agrees with progress made and goals achieved: Yes  [name] 08/02/2014, 9:08 AM

## 2014-08-03 DIAGNOSIS — I69398 Other sequelae of cerebral infarction: Secondary | ICD-10-CM | POA: Diagnosis not present

## 2014-08-03 DIAGNOSIS — R2689 Other abnormalities of gait and mobility: Secondary | ICD-10-CM | POA: Diagnosis not present

## 2014-08-06 DIAGNOSIS — I6789 Other cerebrovascular disease: Secondary | ICD-10-CM | POA: Diagnosis not present

## 2014-08-06 DIAGNOSIS — R1319 Other dysphagia: Secondary | ICD-10-CM | POA: Diagnosis not present

## 2014-08-06 DIAGNOSIS — M62830 Muscle spasm of back: Secondary | ICD-10-CM | POA: Diagnosis not present

## 2014-08-06 DIAGNOSIS — I69959 Hemiplegia and hemiparesis following unspecified cerebrovascular disease affecting unspecified side: Secondary | ICD-10-CM | POA: Diagnosis not present

## 2014-08-20 DIAGNOSIS — K625 Hemorrhage of anus and rectum: Secondary | ICD-10-CM | POA: Diagnosis not present

## 2014-08-20 DIAGNOSIS — I69398 Other sequelae of cerebral infarction: Secondary | ICD-10-CM | POA: Diagnosis not present

## 2014-08-24 ENCOUNTER — Inpatient Hospital Stay: Payer: Medicare Other | Admitting: Physical Medicine & Rehabilitation

## 2014-08-24 DIAGNOSIS — I69398 Other sequelae of cerebral infarction: Secondary | ICD-10-CM | POA: Diagnosis not present

## 2014-09-04 DIAGNOSIS — F329 Major depressive disorder, single episode, unspecified: Secondary | ICD-10-CM | POA: Diagnosis not present

## 2014-09-05 DIAGNOSIS — I10 Essential (primary) hypertension: Secondary | ICD-10-CM | POA: Diagnosis not present

## 2014-09-05 DIAGNOSIS — E559 Vitamin D deficiency, unspecified: Secondary | ICD-10-CM | POA: Diagnosis not present

## 2014-09-05 DIAGNOSIS — I6789 Other cerebrovascular disease: Secondary | ICD-10-CM | POA: Diagnosis not present

## 2014-09-05 DIAGNOSIS — K219 Gastro-esophageal reflux disease without esophagitis: Secondary | ICD-10-CM | POA: Diagnosis not present

## 2014-09-07 DIAGNOSIS — M62838 Other muscle spasm: Secondary | ICD-10-CM | POA: Diagnosis not present

## 2014-09-07 DIAGNOSIS — I69398 Other sequelae of cerebral infarction: Secondary | ICD-10-CM | POA: Diagnosis not present

## 2014-09-24 DIAGNOSIS — F329 Major depressive disorder, single episode, unspecified: Secondary | ICD-10-CM | POA: Diagnosis not present

## 2014-09-24 DIAGNOSIS — R4701 Aphasia: Secondary | ICD-10-CM | POA: Diagnosis not present

## 2014-09-25 ENCOUNTER — Ambulatory Visit: Payer: Self-pay | Admitting: Neurology

## 2014-09-25 DIAGNOSIS — I69391 Dysphagia following cerebral infarction: Secondary | ICD-10-CM | POA: Diagnosis not present

## 2014-09-25 DIAGNOSIS — I69351 Hemiplegia and hemiparesis following cerebral infarction affecting right dominant side: Secondary | ICD-10-CM | POA: Diagnosis not present

## 2014-09-25 DIAGNOSIS — E785 Hyperlipidemia, unspecified: Secondary | ICD-10-CM | POA: Diagnosis not present

## 2014-09-25 DIAGNOSIS — Z8673 Personal history of transient ischemic attack (TIA), and cerebral infarction without residual deficits: Secondary | ICD-10-CM | POA: Diagnosis not present

## 2014-09-25 DIAGNOSIS — I4510 Unspecified right bundle-branch block: Secondary | ICD-10-CM | POA: Diagnosis not present

## 2014-09-25 DIAGNOSIS — Z87891 Personal history of nicotine dependence: Secondary | ICD-10-CM | POA: Diagnosis not present

## 2014-09-25 DIAGNOSIS — N390 Urinary tract infection, site not specified: Secondary | ICD-10-CM | POA: Diagnosis present

## 2014-09-25 DIAGNOSIS — R531 Weakness: Secondary | ICD-10-CM | POA: Diagnosis present

## 2014-09-25 DIAGNOSIS — G9340 Encephalopathy, unspecified: Secondary | ICD-10-CM | POA: Diagnosis present

## 2014-09-25 DIAGNOSIS — E860 Dehydration: Secondary | ICD-10-CM | POA: Diagnosis present

## 2014-09-25 DIAGNOSIS — R1312 Dysphagia, oropharyngeal phase: Secondary | ICD-10-CM | POA: Diagnosis not present

## 2014-09-25 DIAGNOSIS — J189 Pneumonia, unspecified organism: Secondary | ICD-10-CM | POA: Diagnosis not present

## 2014-09-25 DIAGNOSIS — I69322 Dysarthria following cerebral infarction: Secondary | ICD-10-CM | POA: Diagnosis not present

## 2014-09-25 DIAGNOSIS — F329 Major depressive disorder, single episode, unspecified: Secondary | ICD-10-CM | POA: Diagnosis present

## 2014-09-25 DIAGNOSIS — D649 Anemia, unspecified: Secondary | ICD-10-CM | POA: Diagnosis present

## 2014-09-25 DIAGNOSIS — I6789 Other cerebrovascular disease: Secondary | ICD-10-CM | POA: Diagnosis not present

## 2014-09-25 DIAGNOSIS — F4489 Other dissociative and conversion disorders: Secondary | ICD-10-CM | POA: Diagnosis not present

## 2014-09-25 DIAGNOSIS — R7989 Other specified abnormal findings of blood chemistry: Secondary | ICD-10-CM | POA: Diagnosis not present

## 2014-09-25 DIAGNOSIS — R410 Disorientation, unspecified: Secondary | ICD-10-CM | POA: Diagnosis not present

## 2014-09-25 DIAGNOSIS — R400 Somnolence: Secondary | ICD-10-CM | POA: Diagnosis not present

## 2014-09-25 DIAGNOSIS — I10 Essential (primary) hypertension: Secondary | ICD-10-CM | POA: Diagnosis present

## 2014-09-25 DIAGNOSIS — I639 Cerebral infarction, unspecified: Secondary | ICD-10-CM | POA: Diagnosis not present

## 2014-09-25 DIAGNOSIS — R4182 Altered mental status, unspecified: Secondary | ICD-10-CM | POA: Diagnosis not present

## 2014-09-25 DIAGNOSIS — Z8546 Personal history of malignant neoplasm of prostate: Secondary | ICD-10-CM | POA: Diagnosis not present

## 2014-10-03 ENCOUNTER — Other Ambulatory Visit: Payer: Self-pay | Admitting: *Deleted

## 2014-10-03 ENCOUNTER — Encounter: Payer: Self-pay | Admitting: Neurology

## 2014-10-03 ENCOUNTER — Telehealth: Payer: Self-pay | Admitting: Neurology

## 2014-10-03 ENCOUNTER — Telehealth: Payer: Self-pay | Admitting: *Deleted

## 2014-10-03 ENCOUNTER — Ambulatory Visit (INDEPENDENT_AMBULATORY_CARE_PROVIDER_SITE_OTHER): Payer: Medicare Other | Admitting: Neurology

## 2014-10-03 VITALS — BP 118/71 | HR 81 | Ht 67.0 in

## 2014-10-03 DIAGNOSIS — I639 Cerebral infarction, unspecified: Secondary | ICD-10-CM

## 2014-10-03 MED ORDER — CLOPIDOGREL BISULFATE 75 MG PO TABS: 75.0000 mg | ORAL_TABLET | Freq: Every day | ORAL | Status: AC

## 2014-10-03 MED ORDER — CLOPIDOGREL BISULFATE 75 MG PO TABS: 75.0000 mg | ORAL_TABLET | Freq: Every day | ORAL | Status: DC

## 2014-10-03 NOTE — Progress Notes (Signed)
[facility]    Provider:  Dr Jaynee Eagles Referring Provider: Gaynelle Arabian, MD Primary Care Physician:  Simona Huh, MD  CC:  Stroke and confusion  HPI:  [name] is a 81 y.o. male here as a referral from Dr. Marisue Humble for stroke  [name] is a 81 y.o. left handed male with history of HTN, prostate cancer, recent PNA with malaise X 1 week who was admitted on 06/27/14 with right sided weakness with right gaze preference and difficulty talking.  Patient was noted to be in A fib in setting of CAP. 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 Neurology followed for input and recommended ASA for thrombotic infarct due to small vessel disease.MBS revealed dysphagia and he was started on dysphagia 1 diet with nectar liquids but continues to have coughing episodes due to difficulty handling secretions  He was doing well, but ove rthe weekend noticed a change in walking. Since then it has not been the same, about 5 weeks ago. He is walking less. He is more confused. He has a dullness. He is at [facility] and he is not having mental stimulation. He has no social interaction. Since January he is having a cognitive decline. [place] and his wife provide the information. He didn't remember seeing his daughter. He is walking some but he can't go up 4 steps. He used to read the newspapers every day, he has lost interest. This has been going on for longer than 4 weeks. He was having difficulty swallowing, he is on a soft diet at the facility. The right arm is still weak, the leg has improved. He is on an SSRI for depression.   Reviewed notes, labs and imaging from outside physicians, which showed:  Ct Angio Head and Neck W/cm &/or Wo Cm 06/28/2014  CTA HEAD: Moderately motion degraded examination limits evaluation without large vessel occlusion. Mild luminal irregularity in a pattern suggesting  atherosclerosis.  CTA NECK: Calcific atherosclerosis of the carotid bulbs without hemodynamically significant stenosis by NASCET criteria.  LEFT V1 and V2 intimal irregularity suggesting remote dissection without convincing evidence of acute vascular injury nor flow-limiting stenosis.     Dg Chest 2 View 06/27/2014  Right lower lobe and right middle lobe infiltrates consistent with pneumonia.    Ct Head (brain) Wo Contrast 06/27/2014  No evidence of acute intracranial abnormality. Atrophy, chronic small-vessel white matter ischemic changes and remote appearing right basal ganglia lacunar infarct.    MRI / MRA Brain Wo Contrast (personally reviewed images and agree with results) 06/28/2014  1. Acute nonhemorrhagic left paramedian pontine infarct corresponds with the patient's symptoms.  2. No acute supratentorial infarcts.  3. Extensive atrophy and white matter disease likely reflects the sequela of chronic microvascular ischemia.  4. Atherosclerotic changes in the vertebral arteries and basilar artery without a significant stenosis.  5. Mild distal small vessel attenuation throughout, worse in the posterior circulation.    Dg Chest Port 1 View 06/28/2014  Mildly worsening right basilar pneumonia noted.    Carotid Doppler There is 1-39% bilateral ICA stenosis. Vertebral artery flow is antegrade.   2-D echocardiogram 06/28/2014  - Left ventricle: The cavity size was normal. There was mild focal basal hypertrophy of the septum. Systolic function was normal. The estimated ejection fraction was in the range of 55% to 60%. Doppler parameters are consistent with abnormal left ventricular relaxation (grade 1 diastolic dysfunction).  - No cardiac source of emboli was indentified.  Review of Systems: Patient complains of symptoms per HPI as well as the following symptoms: appetite change, runny nose, rectal bleeding, muscle cramps, walking  difficulty, confusion, depression. Pertinent negatives per HPI. All others negative.   History   Social History  . Marital Status: Married    Spouse Name: Herbert Pun  . Number of Children: 2  . Years of Education: MBA   Occupational History  . Not on file.   Social History Main Topics  . Smoking status: Former Research scientist (life sciences)  . Smokeless tobacco: Not on file  . Alcohol Use: 0.0 oz/week    0 Standard drinks or equivalent per week     Comment: Occasionally  . Drug Use: No  . Sexual Activity: Not on file   Other Topics Concern  . Not on file   Social History Narrative   Live at [employer].   Caffeine: Drinks 2 diet pepsi per week.    Drinks coffee once per week.     Family History  Problem Relation Age of Onset  . CAD Father     Past Medical History  Diagnosis Date  . Hypertension   . Cancer     Past Surgical History  Procedure Laterality Date  . Appendectomy    . Kidney surgery      Current Outpatient Prescriptions  Medication Sig Dispense Refill  . acetaminophen (TYLENOL) 325 MG tablet Take 2 tablets (650 mg total) by mouth every 6 (six) hours as needed for mild pain.    Marland Kitchen aspirin 325 MG tablet Take 1 tablet (325 mg total) by mouth daily.    Marland Kitchen atorvastatin (LIPITOR) 20 MG tablet Take 1 tablet (20 mg total) by mouth daily at 6 PM.    . CHOLECALCIFEROL PO Take 50,000 Units by mouth once a week.    . polycarbophil (FIBERCON) 625 MG tablet Take 1 tablet (625 mg total) by mouth 2 (two) times daily.    Marland Kitchen saccharomyces boulardii (FLORASTOR) 250 MG capsule Take 1 capsule (250 mg total) by mouth 2 (two) times daily.    Marland Kitchen senna-docusate (SENOKOT-S) 8.6-50 MG per tablet Take 1 tablet by mouth at bedtime as needed for mild constipation.    . sertraline (ZOLOFT) 50 MG tablet Take 50 mg by mouth daily.    . baclofen (LIORESAL) 5 mg TABS tablet Take 0.5 tablets (5 mg total) by mouth 3 (three) times daily as needed (hiccups). (Patient not taking: Reported on 10/03/2014)    . feeding  supplement, ENSURE, (ENSURE) PUDG Take 1 Container by mouth 3 (three) times daily with meals. (Patient not taking: Reported on 10/03/2014)  0  . ipratropium-albuterol (DUONEB) 0.5-2.5 (3) MG/3ML SOLN Take 3 mLs by nebulization every 4 (four) hours as needed. (Patient not taking: Reported on 10/03/2014) 360 mL   . pantoprazole (PROTONIX) 40 MG tablet Take 1 tablet (40 mg total) by mouth daily. (Patient not taking: Reported on 10/03/2014)    . white petrolatum (VASELINE) GEL Apply 1 application topically 2 (two) times daily. (Patient not taking: Reported on 10/03/2014)  0   No current facility-administered medications for this visit.    Allergies as of 10/03/2014  . (No Known Allergies)    Vitals: BP 118/71 mmHg  Pulse 81  Ht 5\' 7"  (1.702 m) Last Weight:  Wt Readings from Last 1 Encounters:  08/01/14 133 lb 6.1 oz (60.5 kg)   Last Height:   Ht Readings from Last 1 Encounters:  10/03/14 5\' 7"  (1.702 m)    Physical exam: Exam:  Gen: NAD, on nasal canula                     CV: RRR, no MRG. No Carotid Bruits. No peripheral edema, warm, nontender Eyes: Conjunctivae clear without exudates or hemorrhage  Neuro: Detailed Neurologic Exam  Speech:    Dysarthria  Cognition: MoCA 6/30    The patient is oriented to person, follows one-step commands    recent and remote memory impaired;     Impaired attention, concentration,     fund of knowledge Cranial Nerves:    The pupils are equal, round, and reactive to light. Attempted to visualize fundi but pupils small. Visual fields are full to threat. Right gaze preference but extraocular movements are full. Trigeminal sensation is intact and the muscles of mastication are normal.right facial droop.  The palate elevates in the midline. Hearing impaired. Voice is normal. Shoulder shrug is normal. The tongue has normal motion without fasciculations.   Coordination:    Attempted, cannot perform due to weakness  Gait:    Attempted, cannot perform due  to weakness  Motor Observation:    no involuntary movements noted. Tone:    Right spasticity  Posture:    Posture is erect in wheelchair    Strength:     Dense right hemiplegia 0/5 strength.  Sensation: right hemi-sensory loss     Reflex Exam:  DTR's: spastic on the right     Toes:    upgoing left Clonus:    Clonus is absent.     ASSESSMENT/PLAN [name] is a 81 y.o. left handed male with history of HTN, prostate cancer, recent PNA with malaise X 1 week who was admitted on 06/27/14 with right sided weakness with right gaze preference and difficulty talking.  Patient was noted to be in A fib in setting of CAP. MRI/MRA head with acute nonhemorrhagic left paramedian pontine with extensive atrophy and white matter disease. Neurology followed for input and recommended ASA for thrombotic infarct due to small vessel disease.MBS revealed dysphagia and he was started on dysphagia 1 diet with nectar liquids but continues to have coughing episodes due to difficulty handling secretions  Patient with subsequent right hemiparesis with sensory deficits, left inattention, dysphagia, severe dysarthria as well as cognitive linguistic deficits. CIR was recommended for follow up therapy.    - Will repeat MRI of the brain due to new deficits per family, has had decline in functionality and gait. - Was on no anti-thrombotic therapy prior to stroke, now on asa 325mg  -Ongoing aggressive stroke risk factor management - Continue Lipitor  [phone] lives in [place] daughter   Sarina Ill, MD  [facility] [address] [place], [place] [address]  Phone [phone] Fax [phone]

## 2014-10-03 NOTE — Telephone Encounter (Signed)
Tried calling patient and left a message to clarify which rehab center he was going to. Gave [facility] phone number.

## 2014-10-03 NOTE — Telephone Encounter (Signed)
I faxed over the plavix 75mg  prescription to Berna Bue with [facility] at [facility] @ [phone] for clarification for their records. I had Dr. Felecia Shelling sign because Dr. Jaynee Eagles is out of the office.

## 2014-10-03 NOTE — Telephone Encounter (Signed)
Spouse returned call and stated patients at [facility], [place] @ [place].  Contact # [phone].

## 2014-10-03 NOTE — Telephone Encounter (Signed)
[name] with [facility] at [facility] @ [phone], requesting Rx for clopidogrel (PLAVIX) 75 MG tablet faxed to Rehab facility @ [phone].  Please call and advise.

## 2014-10-03 NOTE — Telephone Encounter (Signed)
I called to make sure [facility]

## 2014-10-03 NOTE — Patient Instructions (Signed)
Overall you are doing fairly well but I do want to suggest a few things today:   Remember to drink plenty of fluid, eat healthy meals and do not skip any meals. Try to eat protein with a every meal and eat a healthy snack such as fruit or nuts in between meals. Try to keep a regular sleep-wake schedule and try to exercise daily, particularly in the form of walking, 20-30 minutes a day, if you can.   As far as your medications are concerned, I would like to suggest: Stop aspirin and start Plavix  As far as diagnostic testing: MRI of the brain  I would like to see you back in 4-6 weeks, sooner if we need to. Please call us with any interim questions, concerns, problems, updates or refill requests.   Please also call us for any test results so we can go over those with you on the phone.  My clinical assistant and will answer any of your questions and relay your messages to me and also relay most of my messages to you.   Our phone number is [phone]. We also have an after hours call service for urgent matters and there is a physician on-call for urgent questions. For any emergencies you know to call 911 or go to the nearest emergency room

## 2014-10-03 NOTE — Telephone Encounter (Signed)
I faxed the prescription for plavix to clarify the order and how many mg the patient should be taking to Berna Bue with [facility] at [facility] @ [phone]. They wanted this for their records. Dr. Felecia Shelling signed the prescription for Dr. Jaynee Eagles because she is out of the office.

## 2014-10-08 ENCOUNTER — Emergency Department (HOSPITAL_COMMUNITY): Payer: Medicare Other

## 2014-10-08 ENCOUNTER — Inpatient Hospital Stay (HOSPITAL_COMMUNITY)
Admission: EM | Admit: 2014-10-08 | Discharge: 2014-10-11 | DRG: 689 | Disposition: A | Discharge status: Skilled Nursing Facility | Payer: Medicare Other | Attending: Internal Medicine | Admitting: Internal Medicine

## 2014-10-08 ENCOUNTER — Encounter (HOSPITAL_COMMUNITY): Payer: Self-pay | Admitting: Emergency Medicine

## 2014-10-08 DIAGNOSIS — R531 Weakness: Secondary | ICD-10-CM | POA: Diagnosis not present

## 2014-10-08 DIAGNOSIS — I10 Essential (primary) hypertension: Secondary | ICD-10-CM | POA: Diagnosis present

## 2014-10-08 DIAGNOSIS — R400 Somnolence: Secondary | ICD-10-CM | POA: Diagnosis not present

## 2014-10-08 DIAGNOSIS — I69351 Hemiplegia and hemiparesis following cerebral infarction affecting right dominant side: Secondary | ICD-10-CM | POA: Diagnosis not present

## 2014-10-08 DIAGNOSIS — G9340 Encephalopathy, unspecified: Secondary | ICD-10-CM | POA: Diagnosis present

## 2014-10-08 DIAGNOSIS — R778 Other specified abnormalities of plasma proteins: Secondary | ICD-10-CM | POA: Diagnosis present

## 2014-10-08 DIAGNOSIS — D649 Anemia, unspecified: Secondary | ICD-10-CM | POA: Diagnosis present

## 2014-10-08 DIAGNOSIS — R7989 Other specified abnormal findings of blood chemistry: Secondary | ICD-10-CM | POA: Diagnosis not present

## 2014-10-08 DIAGNOSIS — F4489 Other dissociative and conversion disorders: Secondary | ICD-10-CM | POA: Diagnosis not present

## 2014-10-08 DIAGNOSIS — N390 Urinary tract infection, site not specified: Secondary | ICD-10-CM | POA: Diagnosis present

## 2014-10-08 DIAGNOSIS — A419 Sepsis, unspecified organism: Secondary | ICD-10-CM | POA: Insufficient documentation

## 2014-10-08 DIAGNOSIS — R1312 Dysphagia, oropharyngeal phase: Secondary | ICD-10-CM | POA: Diagnosis not present

## 2014-10-08 DIAGNOSIS — E860 Dehydration: Secondary | ICD-10-CM | POA: Diagnosis present

## 2014-10-08 DIAGNOSIS — F329 Major depressive disorder, single episode, unspecified: Secondary | ICD-10-CM | POA: Diagnosis present

## 2014-10-08 DIAGNOSIS — I4510 Unspecified right bundle-branch block: Secondary | ICD-10-CM | POA: Diagnosis not present

## 2014-10-08 DIAGNOSIS — Z87891 Personal history of nicotine dependence: Secondary | ICD-10-CM | POA: Diagnosis not present

## 2014-10-08 DIAGNOSIS — Z8546 Personal history of malignant neoplasm of prostate: Secondary | ICD-10-CM | POA: Diagnosis not present

## 2014-10-08 DIAGNOSIS — I69322 Dysarthria following cerebral infarction: Secondary | ICD-10-CM | POA: Diagnosis not present

## 2014-10-08 DIAGNOSIS — I6789 Other cerebrovascular disease: Secondary | ICD-10-CM | POA: Diagnosis not present

## 2014-10-08 DIAGNOSIS — E785 Hyperlipidemia, unspecified: Secondary | ICD-10-CM | POA: Diagnosis not present

## 2014-10-08 DIAGNOSIS — Z8673 Personal history of transient ischemic attack (TIA), and cerebral infarction without residual deficits: Secondary | ICD-10-CM | POA: Diagnosis not present

## 2014-10-08 DIAGNOSIS — R4182 Altered mental status, unspecified: Secondary | ICD-10-CM | POA: Diagnosis not present

## 2014-10-08 DIAGNOSIS — R410 Disorientation, unspecified: Secondary | ICD-10-CM | POA: Diagnosis not present

## 2014-10-08 DIAGNOSIS — I69391 Dysphagia following cerebral infarction: Secondary | ICD-10-CM | POA: Diagnosis not present

## 2014-10-08 HISTORY — DX: Cerebral infarction, unspecified: I63.9

## 2014-10-08 LAB — COMPREHENSIVE METABOLIC PANEL
ALK PHOS: 81 U/L (ref 39–117)
ALT: 14 U/L (ref 0–53)
AST: 13 U/L (ref 0–37)
Albumin: 3 g/dL — ABNORMAL LOW (ref 3.5–5.2)
Anion gap: 12 (ref 5–15)
BILIRUBIN TOTAL: 0.6 mg/dL (ref 0.3–1.2)
BUN: 22 mg/dL (ref 6–23)
CO2: 23 mmol/L (ref 19–32)
Calcium: 9.6 mg/dL (ref 8.4–10.5)
Chloride: 100 mmol/L (ref 96–112)
Creatinine, Ser: 0.89 mg/dL (ref 0.50–1.35)
GFR calc Af Amer: 87 mL/min — ABNORMAL LOW (ref >90)
GFR, EST NON AFRICAN AMERICAN: 75 mL/min — AB (ref >90)
Glucose, Bld: 92 mg/dL (ref 70–99)
Potassium: 4 mmol/L (ref 3.5–5.1)
Sodium: 135 mmol/L (ref 135–145)
Total Protein: 6.4 g/dL (ref 6.0–8.3)

## 2014-10-08 LAB — URINE MICROSCOPIC-ADD ON

## 2014-10-08 LAB — URINALYSIS, ROUTINE W REFLEX MICROSCOPIC
Bilirubin Urine: NEGATIVE
GLUCOSE, UA: NEGATIVE mg/dL
Ketones, ur: NEGATIVE mg/dL
Nitrite: NEGATIVE
Protein, ur: 100 mg/dL — AB
SPECIFIC GRAVITY, URINE: 1.022 (ref 1.005–1.030)
Urobilinogen, UA: 1 mg/dL (ref 0.0–1.0)
pH: 7 (ref 5.0–8.0)

## 2014-10-08 LAB — CBC WITH DIFFERENTIAL/PLATELET
BASOS ABS: 0.1 10*3/uL (ref 0.0–0.1)
Basophils Relative: 0 % (ref 0–1)
EOS PCT: 3 % (ref 0–5)
Eosinophils Absolute: 0.4 10*3/uL (ref 0.0–0.7)
HCT: 36.7 % — ABNORMAL LOW (ref 39.0–52.0)
Hemoglobin: 12 g/dL — ABNORMAL LOW (ref 13.0–17.0)
LYMPHS PCT: 10 % — AB (ref 12–46)
Lymphs Abs: 1.5 10*3/uL (ref 0.7–4.0)
MCH: 28 pg (ref 26.0–34.0)
MCHC: 32.7 g/dL (ref 30.0–36.0)
MCV: 85.5 fL (ref 78.0–100.0)
MONOS PCT: 11 % (ref 3–12)
Monocytes Absolute: 1.8 10*3/uL — ABNORMAL HIGH (ref 0.1–1.0)
NEUTROS ABS: 11.9 10*3/uL — AB (ref 1.7–7.7)
Neutrophils Relative %: 76 % (ref 43–77)
Platelets: 442 10*3/uL — ABNORMAL HIGH (ref 150–400)
RBC: 4.29 MIL/uL (ref 4.22–5.81)
RDW: 13.7 % (ref 11.5–15.5)
WBC: 15.7 10*3/uL — AB (ref 4.0–10.5)

## 2014-10-08 LAB — TROPONIN I: Troponin I: 0.14 ng/mL — ABNORMAL HIGH (ref <0.031)

## 2014-10-08 LAB — AMMONIA: AMMONIA: 48 umol/L — AB (ref 11–32)

## 2014-10-08 LAB — LACTIC ACID, PLASMA: LACTIC ACID, VENOUS: 0.8 mmol/L (ref 0.5–2.0)

## 2014-10-08 LAB — CBG MONITORING, ED: Glucose-Capillary: 94 mg/dL (ref 70–99)

## 2014-10-08 MED ORDER — CEFTRIAXONE SODIUM 1 G IJ SOLR
1.0000 g | Freq: Once | INTRAMUSCULAR | Status: AC
  Administered 2014-10-08: 1 g via INTRAVENOUS
  Filled 2014-10-08: qty 10

## 2014-10-08 NOTE — Telephone Encounter (Signed)
Patient wife is calling wanting a call back due to the patient depression getting worst and the patient is concerned.

## 2014-10-08 NOTE — H&P (Signed)
[facility] History and Physical  [name] FHQ:[id number] DOB: [birth year] DOA: 10/08/2014  Referring physician: ER physician. PCP: Simona Huh, MD   Chief Complaint: Confusion and weakness.  History obtained from patient's daughter and the ER physician and previous records. Patient appears confused.  HPI: [name] is a 81 y.o. male with history of recent stroke which left patient with right-sided weakness and dysphagia was brought to the ER from the nursing home after patient was found to be increasingly weak and confused over the last 3-4 days. As per patient's family patient was doing well until last month when patient suddenly became more weaker. Last week patient's primary care physician had changed patient's aspirin to Plavix. Over the last 4 days patient has become increasingly weak and unable to ambulate and confused. CT of the head did not show anything acute. In the ER patient was afebrile. UA showing features consistent with UTI. On exam patient appears confused has right-sided weakness from previous stroke and patient had failed swallow evaluation.  Review of Systems: As presented in the history of presenting illness, rest negative.  Past Medical History  Diagnosis Date  . Hypertension   . Cancer   . Stroke    Past Surgical History  Procedure Laterality Date  . Appendectomy    . Kidney surgery     Social History:  reports that he has quit smoking. He does not have any smokeless tobacco history on file. He reports that he drinks alcohol. He reports that he does not use illicit drugs. Where does patient live nursing home. Can patient participate in ADLs? Not sure.  No Known Allergies  Family History:  Family History  Problem Relation Age of Onset  . CAD Father       Prior to Admission medications   Medication Sig Start Date End Date Taking? Authorizing Provider  acetaminophen (TYLENOL) 325 MG tablet Take 2 tablets (650 mg total) by mouth every  6 (six) hours as needed for mild pain. 07/30/14  Yes Ivan Anchors Love, PA-C  atorvastatin (LIPITOR) 20 MG tablet Take 1 tablet (20 mg total) by mouth daily at 6 PM. 07/30/14  Yes Ivan Anchors Love, PA-C  baclofen (LIORESAL) 5 mg TABS tablet Take 0.5 tablets (5 mg total) by mouth 3 (three) times daily as needed (hiccups). 07/30/14  Yes Ivan Anchors Love, PA-C  CHOLECALCIFEROL PO Take 50,000 Units by mouth once a week.   Yes Historical Provider, MD  clopidogrel (PLAVIX) 75 MG tablet Take 1 tablet (75 mg total) by mouth daily. 10/03/14  Yes Britt Bottom, MD  feeding supplement, ENSURE, (ENSURE) PUDG Take 1 Container by mouth 3 (three) times daily with meals. 07/30/14  Yes Ivan Anchors Love, PA-C  ipratropium-albuterol (DUONEB) 0.5-2.5 (3) MG/3ML SOLN Take 3 mLs by nebulization every 4 (four) hours as needed. 07/30/14  Yes Ivan Anchors Love, PA-C  polycarbophil (FIBERCON) 625 MG tablet Take 1 tablet (625 mg total) by mouth 2 (two) times daily. 07/30/14  Yes Ivan Anchors Love, PA-C  saccharomyces boulardii (FLORASTOR) 250 MG capsule Take 1 capsule (250 mg total) by mouth 2 (two) times daily. 07/30/14  Yes Ivan Anchors Love, PA-C  senna-docusate (SENOKOT-S) 8.6-50 MG per tablet Take 1 tablet by mouth at bedtime as needed for mild constipation. 07/30/14  Yes Ivan Anchors Love, PA-C  sertraline (ZOLOFT) 50 MG tablet Take 50 mg by mouth daily.   Yes Historical Provider, MD  white petrolatum (VASELINE) GEL Apply 1 application topically 2 (two) times daily.  07/30/14  Yes Ivan Anchors Love, PA-C  pantoprazole (PROTONIX) 40 MG tablet Take 1 tablet (40 mg total) by mouth daily. Patient not taking: Reported on 10/03/2014 07/30/14   Bary Leriche, PA-C    Physical Exam: Filed Vitals:   10/08/14 1945 10/08/14 2000 10/08/14 2100 10/08/14 2115  BP: 133/62 124/61 134/64 136/78  Pulse: 78 74 72 73  Temp:      TempSrc:      Resp: 19 17 18 17   Height:      Weight:      SpO2: 93% 95% 94% 89%     General:  Well-developed and nourished.  Eyes: Anicteric no  pallor.  ENT: No discharge from the ears eyes nose and mouth.  Neck: No mass felt.  Cardiovascular: S1-S2 heard.  Respiratory: No rhonchi or crepitations.  Abdomen: Soft nontender bowel sounds present.  Skin: No rash.  Musculoskeletal: No edema.  Psychiatric: Patient is confused.  Neurologic: Patient is lethargic and confused. Unable to move his right upper extremity. Is able to move his left upper extremity and lower extremity. Perla positive.  Labs on Admission:  Basic Metabolic Panel:  Recent Labs Lab 10/08/14 1704  NA 135  K 4.0  CL 100  CO2 23  GLUCOSE 92  BUN 22  CREATININE 0.89  CALCIUM 9.6   Liver Function Tests:  Recent Labs Lab 10/08/14 1704  AST 13  ALT 14  ALKPHOS 81  BILITOT 0.6  PROT 6.4  ALBUMIN 3.0*   No results for input(s): LIPASE, AMYLASE in the last 168 hours.  Recent Labs Lab 10/08/14 1704  AMMONIA 48*   CBC:  Recent Labs Lab 10/08/14 1704  WBC 15.7*  NEUTROABS 11.9*  HGB 12.0*  HCT 36.7*  MCV 85.5  PLT 442*   Cardiac Enzymes:  Recent Labs Lab 10/08/14 1704  TROPONINI 0.14*    BNP (last 3 results) No results for input(s): BNP in the last 8760 hours.  ProBNP (last 3 results) No results for input(s): PROBNP in the last 8760 hours.  CBG:  Recent Labs Lab 10/08/14 1735  GLUCAP 94    Radiological Exams on Admission: Dg Chest 2 View  10/08/2014   CLINICAL DATA:  Altered level of consciousness, hypertension, incontinence since 10/06/2014  EXAM: CHEST  2 VIEW  COMPARISON:  06/2014  FINDINGS: Heart size and vascular pattern normal. No consolidation or effusion. Lateral radiograph is of limited diagnostic utility given lack of patient cooperation. Mildly increased bibasilar markings less prominent when compared to the prior study, most consistent with mild atelectasis or scarring.  IMPRESSION: No active cardiopulmonary disease.   Electronically Signed   By: Skipper Cliche M.D.   On: 10/08/2014 18:03   Ct Head Wo  Contrast  10/08/2014   CLINICAL DATA:  81 year old male with altered mental status. Image quality limited secondary to patient motion  EXAM: CT HEAD WITHOUT CONTRAST  TECHNIQUE: Contiguous axial images were obtained from the base of the skull through the vertex without intravenous contrast.  COMPARISON:  Most recent prior brain MRI 06/28/2014; Prior head CT also 06/28/2014  FINDINGS: Negative for acute intracranial hemorrhage, acute infarction, mass, mass effect, hydrocephalus or midline shift. Gray-white differentiation is preserved throughout. New stable atrophy. Confluent periventricular and subcortical white matter hypoattenuation is similar compared to prior and consistent with chronic microvascular ischemic white matter disease of moderate to advanced severity. Small lacunar infarcts are present within both basal ganglia and are unchanged. No focal calvarial or soft tissue abnormality. Normal aeration of the  mastoid air cells and visualized paranasal sinuses.  IMPRESSION: 1. No acute intracranial abnormality. 2. Stable atrophy and advanced chronic microvascular ischemic white matter disease.   Electronically Signed   By: Jacqulynn Cadet M.D.   On: 10/08/2014 18:17    EKG: Independently reviewed. Normal sinus rhythm.  Assessment/Plan Principal Problem:   Acute encephalopathy Active Problems:   Normocytic anemia   Essential hypertension   UTI (lower urinary tract infection)   Elevated troponin   1. Acute encephalopathy - could be secondary to UTI but since patient has had recent stroke and change in mental status we will get MRI brain. Get speech therapy evaluation since patient failed swallowing the ER. Continue with gentle hydration and antibiotics for UTI. 2. Elevated troponin - as per the family patient did not complain of any chest pain. We will cycle cardiac markers. Patient was on Plavix but since patient feels well I have placed patient on aspirin. Patient on statins which can be  continued when patient passed swallow. Check 2-D echo. 3. Recent stroke with right-sided weakness and dysphagia - patient was recently on Plavix but since patient failed swallow and patient's troponin is mildly elevated I have placed patient on aspirin which can be changed to Plavix once patient passes swallow. Continue statins and patient passes swallow. MRI brain has been ordered. #1. 4. UTI - patient is placed on antibiotics. Follow urine cultures. See #1. 5. Chronic anemia - follow CBC. 6. History of hypertension presently not on any antihypertensive - closely follow blood pressure trends. 7. History of depression on Zoloft.   DVT Prophylaxis Lovenox.  Code Status: Full code.  Family Communication: Patient's daughter at the bedside.  Disposition Plan: Admit to inpatient.    Zymir Napoli N. [facility] Pager [phone].  If 7PM-7AM, please contact night-coverage [email or web address] Password [facility] 10/08/2014, 10:01 PM

## 2014-10-08 NOTE — ED Notes (Signed)
Patient transported to X-ray 

## 2014-10-08 NOTE — ED Notes (Signed)
Pt resting with eyes closed; unable to follow commands. Hx of previous stroke with right sided deficits. Awaiting bed placement at this time

## 2014-10-08 NOTE — Telephone Encounter (Signed)
Spoke to wife. His Depression is getting worse. Advised wife that patient can be treated by facility doctor for depression or he can follow up with primary care for his depression.   We have ordered repeat imaging due to regression in progress at the SNF.  The SNF is calling the MRI imaging facility to see if imaging can be moved forward. I also advised if there is a drastic change or if patient is confused, to please alert the facility doctor or medical staff and they can determine if he should be acutely evaluated in the ED.

## 2014-10-08 NOTE — ED Provider Notes (Signed)
CSN: [id number]     Arrival date & time 10/08/14  1634 History   First MD Initiated Contact with Patient 10/08/14 1638     Chief Complaint  Patient presents with  . Stroke Symptoms     (Consider location/radiation/quality/duration/timing/severity/associated sxs/prior Treatment) Patient is a 81 y.o. male presenting with altered mental status.  Altered Mental Status Presenting symptoms: confusion   Presenting symptoms comment:  Generalized weakness Severity:  Moderate Episode history:  Continuous Duration:  3 days Timing:  Constant Progression:  Worsening Chronicity:  New Context: nursing home resident   Context: not head injury   Context comment:  CVA last year, 4 weeks worsening functional status, 3 days worsening mental status Associated symptoms: bladder incontinence and slurred speech   Associated symptoms: no abdominal pain, no decreased appetite, no fever, no nausea and no vomiting     Past Medical History  Diagnosis Date  . Hypertension   . Cancer   . Stroke    Past Surgical History  Procedure Laterality Date  . Appendectomy    . Kidney surgery     Family History  Problem Relation Age of Onset  . CAD Father    History  Substance Use Topics  . Smoking status: Former Research scientist (life sciences)  . Smokeless tobacco: Not on file  . Alcohol Use: 0.0 oz/week    0 Standard drinks or equivalent per week     Comment: Occasionally    Review of Systems  Constitutional: Negative for fever and decreased appetite.  Gastrointestinal: Negative for nausea, vomiting and abdominal pain.  Genitourinary: Positive for bladder incontinence.  Psychiatric/Behavioral: Positive for confusion.  All other systems reviewed and are negative.     Allergies  Review of patient's allergies indicates no known allergies.  Home Medications   Prior to Admission medications   Medication Sig Start Date End Date Taking? Authorizing Provider  acetaminophen (TYLENOL) 325 MG tablet Take 2 tablets (650 mg  total) by mouth every 6 (six) hours as needed for mild pain. 07/30/14  Yes Ivan Anchors Love, PA-C  atorvastatin (LIPITOR) 20 MG tablet Take 1 tablet (20 mg total) by mouth daily at 6 PM. 07/30/14  Yes Ivan Anchors Love, PA-C  baclofen (LIORESAL) 5 mg TABS tablet Take 0.5 tablets (5 mg total) by mouth 3 (three) times daily as needed (hiccups). 07/30/14  Yes Ivan Anchors Love, PA-C  CHOLECALCIFEROL PO Take 50,000 Units by mouth once a week.   Yes Historical Provider, MD  clopidogrel (PLAVIX) 75 MG tablet Take 1 tablet (75 mg total) by mouth daily. 10/03/14  Yes Britt Bottom, MD  feeding supplement, ENSURE, (ENSURE) PUDG Take 1 Container by mouth 3 (three) times daily with meals. 07/30/14  Yes Ivan Anchors Love, PA-C  ipratropium-albuterol (DUONEB) 0.5-2.5 (3) MG/3ML SOLN Take 3 mLs by nebulization every 4 (four) hours as needed. 07/30/14  Yes Ivan Anchors Love, PA-C  polycarbophil (FIBERCON) 625 MG tablet Take 1 tablet (625 mg total) by mouth 2 (two) times daily. 07/30/14  Yes Ivan Anchors Love, PA-C  saccharomyces boulardii (FLORASTOR) 250 MG capsule Take 1 capsule (250 mg total) by mouth 2 (two) times daily. 07/30/14  Yes Ivan Anchors Love, PA-C  senna-docusate (SENOKOT-S) 8.6-50 MG per tablet Take 1 tablet by mouth at bedtime as needed for mild constipation. 07/30/14  Yes Ivan Anchors Love, PA-C  sertraline (ZOLOFT) 50 MG tablet Take 50 mg by mouth daily.   Yes Historical Provider, MD  white petrolatum (VASELINE) GEL Apply 1 application topically 2 (two) times daily.  07/30/14  Yes Ivan Anchors Love, PA-C  pantoprazole (PROTONIX) 40 MG tablet Take 1 tablet (40 mg total) by mouth daily. Patient not taking: Reported on 10/03/2014 07/30/14   Ivan Anchors Love, PA-C   BP 146/60 mmHg  Pulse 76  Temp(Src) 97.5 F (36.4 C) (Oral)  Resp 20  Ht 5\' 10"  (1.778 m)  Wt 130 lb 11.7 oz (59.3 kg)  BMI 18.76 kg/m2  SpO2 97% Physical Exam  Constitutional: He appears well-developed and well-nourished.  HENT:  Head: Normocephalic and atraumatic.  Eyes: Conjunctivae  and EOM are normal.  Neck: Normal range of motion. Neck supple.  Cardiovascular: Normal rate, regular rhythm and normal heart sounds.   Pulmonary/Chest: Effort normal and breath sounds normal. No respiratory distress.  Abdominal: He exhibits no distension. There is no tenderness. There is no rebound and no guarding.  Musculoskeletal: Normal range of motion.  Neurological: He is alert.  Oriented to name alone, R sided weakness throughout 1/5 strength RUE, 2/5 RLE, sensation intact  Skin: Skin is warm and dry.  Vitals reviewed.   ED Course  Procedures (including critical care time) Labs Review Labs Reviewed  CBC WITH DIFFERENTIAL/PLATELET - Abnormal; Notable for the following:    WBC 15.7 (*)    Hemoglobin 12.0 (*)    HCT 36.7 (*)    Platelets 442 (*)    Neutro Abs 11.9 (*)    Lymphocytes Relative 10 (*)    Monocytes Absolute 1.8 (*)    All other components within normal limits  COMPREHENSIVE METABOLIC PANEL - Abnormal; Notable for the following:    Albumin 3.0 (*)    GFR calc non Af Amer 75 (*)    GFR calc Af Amer 87 (*)    All other components within normal limits  URINALYSIS, ROUTINE W REFLEX MICROSCOPIC - Abnormal; Notable for the following:    APPearance TURBID (*)    Hgb urine dipstick LARGE (*)    Protein, ur 100 (*)    Leukocytes, UA LARGE (*)    All other components within normal limits  AMMONIA - Abnormal; Notable for the following:    Ammonia 48 (*)    All other components within normal limits  TROPONIN I - Abnormal; Notable for the following:    Troponin I 0.14 (*)    All other components within normal limits  URINE MICROSCOPIC-ADD ON - Abnormal; Notable for the following:    Bacteria, UA MANY (*)    All other components within normal limits  CULTURE, BLOOD (ROUTINE X 2)  CULTURE, BLOOD (ROUTINE X 2)  URINE CULTURE  LACTIC ACID, PLASMA  LACTIC ACID, PLASMA  TROPONIN I  TROPONIN I  TROPONIN I  COMPREHENSIVE METABOLIC PANEL  CBC WITH DIFFERENTIAL/PLATELET   TSH  HEMOGLOBIN A1C  LIPID PANEL  CBC  CREATININE, SERUM  CBG MONITORING, ED    Imaging Review Dg Chest 2 View  10/08/2014   CLINICAL DATA:  Altered level of consciousness, hypertension, incontinence since 10/06/2014  EXAM: CHEST  2 VIEW  COMPARISON:  06/2014  FINDINGS: Heart size and vascular pattern normal. No consolidation or effusion. Lateral radiograph is of limited diagnostic utility given lack of patient cooperation. Mildly increased bibasilar markings less prominent when compared to the prior study, most consistent with mild atelectasis or scarring.  IMPRESSION: No active cardiopulmonary disease.   Electronically Signed   By: Skipper Cliche M.D.   On: 10/08/2014 18:03   Ct Head Wo Contrast  10/08/2014   CLINICAL DATA:  81 year old male with  altered mental status. Image quality limited secondary to patient motion  EXAM: CT HEAD WITHOUT CONTRAST  TECHNIQUE: Contiguous axial images were obtained from the base of the skull through the vertex without intravenous contrast.  COMPARISON:  Most recent prior brain MRI 06/28/2014; Prior head CT also 06/28/2014  FINDINGS: Negative for acute intracranial hemorrhage, acute infarction, mass, mass effect, hydrocephalus or midline shift. Gray-white differentiation is preserved throughout. New stable atrophy. Confluent periventricular and subcortical white matter hypoattenuation is similar compared to prior and consistent with chronic microvascular ischemic white matter disease of moderate to advanced severity. Small lacunar infarcts are present within both basal ganglia and are unchanged. No focal calvarial or soft tissue abnormality. Normal aeration of the mastoid air cells and visualized paranasal sinuses.  IMPRESSION: 1. No acute intracranial abnormality. 2. Stable atrophy and advanced chronic microvascular ischemic white matter disease.   Electronically Signed   By: Jacqulynn Cadet M.D.   On: 10/08/2014 18:17     EKG Interpretation   Date/Time:   Monday October 08 2014 16:46:39 EDT Ventricular Rate:  79 PR Interval:  166 QRS Duration: 114 QT Interval:  385 QTC Calculation: 441 R Axis:   -30 Text Interpretation:  Normal sinus rhythm with sinus arrhythmia Incomplete  right  bundle branch block Artifact in lead(s) I III aVR aVL aVF No  significant change since last tracing Confirmed by Debby Freiberg [phone])  on 10/08/2014 4:50:31 PM      MDM   Final diagnoses:  Acute encephalopathy    81 y.o. male with pertinent PMH of recent CVA, nursing home resident presents with generalized weakness, confusion. Patient has had gradually declining functional status over the last month, mental status worsened over the last 3 days. No fevers, GI symptoms, other infectious symptoms. Today patient has vital signs and physical exam as above. No focal neurologic deficits outside of what appears to be his baseline. He does have right-sided weakness. He is disoriented, has trouble answering simple questions. Per family this is not normal.  Wu with + trop, UTI.  Given rocephin.  Doubt ACS in clinical scenario, likely demand.  Consulted medicine for admission.    I have reviewed all laboratory and imaging studies if ordered as above  1. Acute encephalopathy         Debby Freiberg, MD 10/09/14 [phone]

## 2014-10-08 NOTE — ED Notes (Signed)
Pt from [facility] pt has been there since Jan from stroke. Right sided deficits from stroke.  Since Friday, pt has had altered level of consciousness. Pt has had increased incontinence, and been unable to get out of bed. Pt alert to self. BP 132/90, HR 82, 95% on room air, CBG 107

## 2014-10-08 NOTE — ED Notes (Signed)
CBG 94 

## 2014-10-09 ENCOUNTER — Inpatient Hospital Stay (HOSPITAL_COMMUNITY): Payer: Medicare Other

## 2014-10-09 DIAGNOSIS — E860 Dehydration: Secondary | ICD-10-CM

## 2014-10-09 LAB — COMPREHENSIVE METABOLIC PANEL
ALBUMIN: 2.9 g/dL — AB (ref 3.5–5.2)
ALK PHOS: 77 U/L (ref 39–117)
ALT: 11 U/L (ref 0–53)
AST: 15 U/L (ref 0–37)
Anion gap: 10 (ref 5–15)
BILIRUBIN TOTAL: 0.7 mg/dL (ref 0.3–1.2)
BUN: 21 mg/dL (ref 6–23)
CHLORIDE: 104 mmol/L (ref 96–112)
CO2: 23 mmol/L (ref 19–32)
Calcium: 9.3 mg/dL (ref 8.4–10.5)
Creatinine, Ser: 0.83 mg/dL (ref 0.50–1.35)
GFR calc Af Amer: 90 mL/min — ABNORMAL LOW (ref >90)
GFR calc non Af Amer: 77 mL/min — ABNORMAL LOW (ref >90)
Glucose, Bld: 98 mg/dL (ref 70–99)
Potassium: 4.1 mmol/L (ref 3.5–5.1)
Sodium: 137 mmol/L (ref 135–145)
Total Protein: 6 g/dL (ref 6.0–8.3)

## 2014-10-09 LAB — LIPID PANEL
CHOL/HDL RATIO: 4.4 ratio
CHOLESTEROL: 105 mg/dL (ref 0–200)
HDL: 24 mg/dL — ABNORMAL LOW (ref >39)
LDL Cholesterol: 54 mg/dL (ref 0–99)
TRIGLYCERIDES: 137 mg/dL (ref <150)
VLDL: 27 mg/dL (ref 0–40)

## 2014-10-09 LAB — CBC WITH DIFFERENTIAL/PLATELET
BASOS ABS: 0.1 10*3/uL (ref 0.0–0.1)
Basophils Relative: 1 % (ref 0–1)
Eosinophils Absolute: 0.4 10*3/uL (ref 0.0–0.7)
Eosinophils Relative: 3 % (ref 0–5)
HEMATOCRIT: 35.2 % — AB (ref 39.0–52.0)
Hemoglobin: 11.5 g/dL — ABNORMAL LOW (ref 13.0–17.0)
LYMPHS PCT: 12 % (ref 12–46)
Lymphs Abs: 1.4 10*3/uL (ref 0.7–4.0)
MCH: 27.9 pg (ref 26.0–34.0)
MCHC: 32.7 g/dL (ref 30.0–36.0)
MCV: 85.4 fL (ref 78.0–100.0)
Monocytes Absolute: 1.3 10*3/uL — ABNORMAL HIGH (ref 0.1–1.0)
Monocytes Relative: 12 % (ref 3–12)
NEUTROS ABS: 8.5 10*3/uL — AB (ref 1.7–7.7)
Neutrophils Relative %: 72 % (ref 43–77)
PLATELETS: 364 10*3/uL (ref 150–400)
RBC: 4.12 MIL/uL — ABNORMAL LOW (ref 4.22–5.81)
RDW: 13.5 % (ref 11.5–15.5)
WBC: 11.7 10*3/uL — AB (ref 4.0–10.5)

## 2014-10-09 LAB — CBC
HEMATOCRIT: 36.1 % — AB (ref 39.0–52.0)
HEMOGLOBIN: 12 g/dL — AB (ref 13.0–17.0)
MCH: 28.6 pg (ref 26.0–34.0)
MCHC: 33.2 g/dL (ref 30.0–36.0)
MCV: 86 fL (ref 78.0–100.0)
Platelets: 391 10*3/uL (ref 150–400)
RBC: 4.2 MIL/uL — AB (ref 4.22–5.81)
RDW: 13.8 % (ref 11.5–15.5)
WBC: 14.4 10*3/uL — AB (ref 4.0–10.5)

## 2014-10-09 LAB — CREATININE, SERUM
CREATININE: 0.88 mg/dL (ref 0.50–1.35)
GFR calc Af Amer: 88 mL/min — ABNORMAL LOW (ref >90)
GFR calc non Af Amer: 76 mL/min — ABNORMAL LOW (ref >90)

## 2014-10-09 LAB — TROPONIN I
TROPONIN I: 0.1 ng/mL — AB (ref <0.031)
TROPONIN I: 0.11 ng/mL — AB (ref <0.031)
Troponin I: 0.14 ng/mL — ABNORMAL HIGH (ref <0.031)

## 2014-10-09 LAB — TSH: TSH: 1.126 u[IU]/mL (ref 0.350–4.500)

## 2014-10-09 LAB — LACTIC ACID, PLASMA: Lactic Acid, Venous: 0.7 mmol/L (ref 0.5–2.0)

## 2014-10-09 LAB — MRSA PCR SCREENING: MRSA BY PCR: NEGATIVE

## 2014-10-09 MED ORDER — ENSURE PUDDING PO PUDG
1.0000 | Freq: Three times a day (TID) | ORAL | Status: DC
  Administered 2014-10-09 (×2): 1 via ORAL

## 2014-10-09 MED ORDER — ENOXAPARIN SODIUM 40 MG/0.4ML ~~LOC~~ SOLN
40.0000 mg | SUBCUTANEOUS | Status: DC
  Administered 2014-10-09: 40 mg via SUBCUTANEOUS
  Filled 2014-10-09 (×3): qty 0.4

## 2014-10-09 MED ORDER — SACCHAROMYCES BOULARDII 250 MG PO CAPS
250.0000 mg | ORAL_CAPSULE | Freq: Two times a day (BID) | ORAL | Status: DC
  Administered 2014-10-09 – 2014-10-11 (×5): 250 mg via ORAL
  Filled 2014-10-09 (×7): qty 1

## 2014-10-09 MED ORDER — SENNOSIDES-DOCUSATE SODIUM 8.6-50 MG PO TABS: 1.0000 | ORAL_TABLET | Freq: Every evening | ORAL | Status: DC | PRN

## 2014-10-09 MED ORDER — RESOURCE THICKENUP CLEAR PO POWD
ORAL | Status: DC | PRN
  Filled 2014-10-09 (×2): qty 125

## 2014-10-09 MED ORDER — CALCIUM POLYCARBOPHIL 625 MG PO TABS
625.0000 mg | ORAL_TABLET | Freq: Two times a day (BID) | ORAL | Status: DC
  Administered 2014-10-09 – 2014-10-11 (×5): 625 mg via ORAL
  Filled 2014-10-09 (×8): qty 1

## 2014-10-09 MED ORDER — BACLOFEN 5 MG HALF TABLET
5.0000 mg | ORAL_TABLET | Freq: Three times a day (TID) | ORAL | Status: DC | PRN
  Filled 2014-10-09: qty 1

## 2014-10-09 MED ORDER — CLOPIDOGREL BISULFATE 75 MG PO TABS: 75.0000 mg | ORAL_TABLET | Freq: Every day | ORAL | Status: DC

## 2014-10-09 MED ORDER — CLOPIDOGREL BISULFATE 75 MG PO TABS
75.0000 mg | ORAL_TABLET | Freq: Every day | ORAL | Status: DC
  Administered 2014-10-10 – 2014-10-11 (×2): 75 mg via ORAL
  Filled 2014-10-09 (×2): qty 1

## 2014-10-09 MED ORDER — SODIUM CHLORIDE 0.9 % IV SOLN
INTRAVENOUS | Status: AC
  Administered 2014-10-09 (×3): via INTRAVENOUS

## 2014-10-09 MED ORDER — IPRATROPIUM-ALBUTEROL 0.5-2.5 (3) MG/3ML IN SOLN: 3.0000 mL | RESPIRATORY_TRACT | Status: DC | PRN

## 2014-10-09 MED ORDER — LORAZEPAM 2 MG/ML IJ SOLN
1.0000 mg | Freq: Once | INTRAMUSCULAR | Status: AC | PRN
  Filled 2014-10-09: qty 1

## 2014-10-09 MED ORDER — ASPIRIN 300 MG RE SUPP
300.0000 mg | Freq: Every day | RECTAL | Status: DC
  Filled 2014-10-09: qty 1

## 2014-10-09 MED ORDER — ENOXAPARIN SODIUM 40 MG/0.4ML ~~LOC~~ SOLN
40.0000 mg | SUBCUTANEOUS | Status: DC
  Filled 2014-10-09 (×2): qty 0.4

## 2014-10-09 MED ORDER — ASPIRIN 325 MG PO TABS
325.0000 mg | ORAL_TABLET | Freq: Every day | ORAL | Status: DC
  Administered 2014-10-09: 325 mg via ORAL
  Filled 2014-10-09: qty 1

## 2014-10-09 MED ORDER — SERTRALINE HCL 50 MG PO TABS
50.0000 mg | ORAL_TABLET | Freq: Every day | ORAL | Status: DC
  Administered 2014-10-09 – 2014-10-11 (×3): 50 mg via ORAL
  Filled 2014-10-09 (×3): qty 1

## 2014-10-09 MED ORDER — ATORVASTATIN CALCIUM 20 MG PO TABS
20.0000 mg | ORAL_TABLET | Freq: Every day | ORAL | Status: DC
  Administered 2014-10-09 – 2014-10-10 (×2): 20 mg via ORAL
  Filled 2014-10-09 (×3): qty 1

## 2014-10-09 MED ORDER — CEFTRIAXONE SODIUM IN DEXTROSE 20 MG/ML IV SOLN
1.0000 g | INTRAVENOUS | Status: DC
  Administered 2014-10-09 – 2014-10-10 (×2): 1 g via INTRAVENOUS
  Filled 2014-10-09 (×3): qty 50

## 2014-10-09 NOTE — Progress Notes (Signed)
81yo male NH resident w/ deficits from stroke in January presents w/ ALOC, increased incontinence, and decreased ambulation, UA abnormal, cx pending, to begin IV ABX for UTI.  Will start Rocephin 1g IV Q24H and monitor CBC and Cx.  Wynona Neat, PharmD, BCPS 10/09/2014 12:20 AM

## 2014-10-09 NOTE — Progress Notes (Signed)
PT Cancellation Note  Patient Details Name: [name] MRN: [id number] DOB: 26-Apr-1926   Cancelled Treatment:    Reason Eval/Treat Not Completed: Patient at procedure or test/unavailable Patient has just gotten into chair to eat lunch. Family requests PT return later. Will follow up as time allows.  [name] 10/09/2014, 1:27 PM [name], [place]

## 2014-10-09 NOTE — Progress Notes (Signed)
3p resting comfortably in chair . Family in attendance

## 2014-10-09 NOTE — Progress Notes (Signed)
UR Completed.  [phone]  

## 2014-10-09 NOTE — Evaluation (Signed)
Clinical/Bedside Swallow Evaluation Patient Details  Name: [name] MRN: [id number] Date of Birth: May 26, 1926  Today's Date: 10/09/2014 Time: SLP Start Time (ACUTE ONLY): 0810 SLP Stop Time (ACUTE ONLY): 0830 SLP Time Calculation (min) (ACUTE ONLY): 20 min  Past Medical History:  Past Medical History  Diagnosis Date  . Hypertension   . Cancer   . Stroke    Past Surgical History:  Past Surgical History  Procedure Laterality Date  . Appendectomy    . 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  Swallow and speech evaluation ordered.     Assessment / Plan / Recommendation Clinical Impression  Pt presents with suspected near baseline function of swallow s/p CVA January 2016.  Ongoing concerns for aspiration of thin liquid and ice noted characterized by strong cough immediately post swallow.  Pt with good tolerance of solids/nectar thickened liquids.  Recommend to initiate dys3/nectar diet with strict aspiration precautions with follow up at SNF.  Reviewed recommendations with pt and compensation strategies.  Note pt is NOT conducting chin tuck with intake which was recommended during previous MBS - doubt he will genralize this strategy at this time and therefore rec conservative diet.      Aspiration Risk  Mild    Diet Recommendation Dysphagia 3 (Mechanical Soft);Nectar-thick liquid   Liquid Administration via: Cup;Straw Medication Administration: Whole meds with puree Supervision: Patient able to self feed Compensations: Slow rate;Small sips/bites Postural Changes and/or Swallow Maneuvers: Seated upright 90 degrees;Upright 30-60 min after meal    Other  Recommendations Oral Care Recommendations: Oral care BID Other Recommendations: Order thickener from pharmacy   Follow Up Recommendations     Defer to SNF SLP  Frequency and  Duration        Pertinent Vitals/Pain Afebrile, decreased      Swallow Study Prior Functional Status   see [place] Date of Onset: 10/09/14 HPI: 81 yo male adm to [facility] with AMS x3 days, functional decline x 1 month, 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  Swallow and speech evaluation ordered.   Type of Study: Bedside swallow evaluation Diet Prior to this Study: NPO Temperature Spikes Noted: No Respiratory Status: Room air History of Recent Intubation: No Behavior/Cognition: Alert;Cooperative;Pleasant mood Oral Cavity - Dentition: Adequate natural dentition Self-Feeding Abilities: Needs assist;Needs set up Patient Positioning: Upright in bed Baseline Vocal Quality: Clear Volitional Cough: Weak Volitional Swallow: Able to elicit    Oral/Motor/Sensory Function Overall Oral Motor/Sensory Function: Impaired at baseline (right sided lingual, labial weakness)   Ice Chips Ice chips: Impaired Presentation: Spoon Oral Phase Impairments: Impaired anterior to posterior transit;Reduced lingual movement/coordination Oral Phase Functional Implications: Prolonged oral transit Pharyngeal Phase Impairments: Suspected delayed Swallow;Cough - Immediate   Thin Liquid Thin Liquid: Impaired Presentation: Cup Oral Phase Impairments: Impaired anterior to posterior transit;Reduced lingual movement/coordination Pharyngeal  Phase Impairments: Cough - Immediate Other Comments: immediate cough with water during oral care - swish and expectorate    Nectar Thick Nectar Thick Liquid: Impaired Presentation: Self Fed;Cup;Straw Oral Phase Impairments: Reduced lingual movement/coordination;Impaired anterior to posterior transit Oral phase functional implications: Prolonged oral transit Pharyngeal Phase Impairments: Suspected delayed Swallow;Multiple swallows   Honey Thick Honey Thick Liquid: Not tested   Puree Puree: Impaired Presentation:  Self Fed;Spoon Oral Phase  Impairments: Reduced lingual movement/coordination;Impaired anterior to posterior transit Oral Phase Functional Implications: Prolonged oral transit Pharyngeal Phase Impairments: Suspected delayed Swallow;Multiple swallows   Solid   GO    Solid: Impaired Presentation: Self Fed Oral Phase Impairments: Reduced lingual movement/coordination;Impaired anterior to posterior transit Oral Phase Functional Implications: Other (comment);Oral residue (mild oral residuals, pt clears independently) Pharyngeal Phase Impairments: Suspected delayed Swallow;Multiple swallows       Luanna Salk, [facility] SLP [phone]

## 2014-10-09 NOTE — Progress Notes (Addendum)
[facility] Progress Note   [name] DGL:[id number] DOB: June 25, 1926 DOA: 10/08/2014 PCP: Simona Huh, MD  Brief narrative: [name] is a 81 y.o. male [name] is a 81 y.o. male with history of recent stroke which left patient with right-sided weakness and dysphagia was brought to the ER from the nursing home after patient was found to be increasingly weak and confused over the last 3-4 days.  Subjective: Has no complaints- quite confused.   Assessment/Plan: Principal Problem:   Acute encephalopathy - suspect from UTI and dehydration- CT head negative- not able to tolerate MRI head and I don't want to add Ativan in setting of acute confusion- follow clinically  Active Problems: UTI - cont Rocephin - f/u cultures  Dehydration - based upon BUN/Cr ratio- will cont IVF    Essential hypertension - follow BP- mildly elevated- not on meds at home- avoid strict control in this elderly individual    Elevated troponin - second TNI is lower- no chest pain- EKG without ST abnormalities - ECHO ordered by admitted MD but I don't feel we need and ECHO for such a mild increase in TNI which is improving (NOTE : new TNI Assay that lab is using is highly sensitive resulting in a number of false positives)  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 - advised to take ASA daily when discharged in Dec- now on Plavix at facility for reasons unknown to me - has received ASA today but will go back to Plavix tomorrow - resume Dysp diet with nectar thick liquids   Appt with PCP: Code Status: full code Family Communication:  Disposition Plan: return to SNF when confusion improves DVT prophylaxis: Lovenox Consultants: Procedures:  Antibiotics: Anti-infectives    Start     Dose/Rate Route Frequency Ordered Stop   10/09/14 2000  cefTRIAXone (ROCEPHIN) 1 g in dextrose 5 % 50 mL IVPB - Premix     1 g 100 mL/hr over 30 Minutes Intravenous Every 24 hours 10/09/14 0020     10/08/14 2000   cefTRIAXone (ROCEPHIN) 1 g in dextrose 5 % 50 mL IVPB     1 g 100 mL/hr over 30 Minutes Intravenous  Once 10/08/14 1955 10/08/14 2121      Objective: Filed Weights   10/08/14 1638 10/09/14 0014  Weight: 60.328 kg (133 lb) 59.3 kg (130 lb 11.7 oz)    Intake/Output Summary (Last 24 hours) at 10/09/14 1239 Last data filed at 10/09/14 0914  Gross per 24 hour  Intake      0 ml  Output    200 ml  Net   -200 ml     Vitals Filed Vitals:   10/09/14 0440 10/09/14 0500 10/09/14 0641 10/09/14 0800  BP: 132/65 142/68 134/66 138/70  Pulse: 77 62 68   Temp: 97.7 F (36.5 C) 98.4 F (36.9 C) 97.1 F (36.2 C)   TempSrc: Oral Oral Oral   Resp: 20 16 16 18   Height:      Weight:      SpO2: 97% 95% 97% 98%    Exam:  General:  Pt is alert, pleasant but quite confused  HEENT: No icterus, No thrush  Cardiovascular: regular rate and rhythm, S1/S2 No murmur  Respiratory: clear to auscultation bilaterally   Abdomen: Soft, +Bowel sounds, non tender, non distended, no guarding  MSK: No LE edema, cyanosis or clubbing  Data Reviewed: Basic Metabolic Panel:  Recent Labs Lab 10/08/14 1704 10/09/14 0040 10/09/14 0554  NA 135  --  137  K 4.0  --  4.1  CL 100  --  104  CO2 23  --  23  GLUCOSE 92  --  98  BUN 22  --  21  CREATININE 0.89 0.88 0.83  CALCIUM 9.6  --  9.3   Liver Function Tests:  Recent Labs Lab 10/08/14 1704 10/09/14 0554  AST 13 15  ALT 14 11  ALKPHOS 81 77  BILITOT 0.6 0.7  PROT 6.4 6.0  ALBUMIN 3.0* 2.9*   No results for input(s): LIPASE, AMYLASE in the last 168 hours.  Recent Labs Lab 10/08/14 1704  AMMONIA 48*   CBC:  Recent Labs Lab 10/08/14 1704 10/09/14 0040 10/09/14 0554  WBC 15.7* 14.4* 11.7*  NEUTROABS 11.9*  --  8.5*  HGB 12.0* 12.0* 11.5*  HCT 36.7* 36.1* 35.2*  MCV 85.5 86.0 85.4  PLT 442* 391 364   Cardiac Enzymes:  Recent Labs Lab 10/08/14 1704 10/09/14 0040 10/09/14 0554  TROPONINI 0.14* 0.14* 0.10*   BNP (last 3  results) No results for input(s): BNP in the last 8760 hours.  ProBNP (last 3 results) No results for input(s): PROBNP in the last 8760 hours.  CBG:  Recent Labs Lab 10/08/14 1735  GLUCAP 94    No results found for this or any previous visit (from the past 240 hour(s)).   Studies:  Recent x-ray studies have been reviewed in detail by the Attending Physician  Scheduled Meds:  Scheduled Meds: . aspirin  300 mg Rectal Daily   Or  . aspirin  325 mg Oral Daily  . atorvastatin  20 mg Oral q1800  . cefTRIAXone (ROCEPHIN)  IV  1 g Intravenous Q24H  . enoxaparin (LOVENOX) injection  40 mg Subcutaneous Q24H  . feeding supplement (ENSURE)  1 Container Oral TID WC  . polycarbophil  625 mg Oral BID  . saccharomyces boulardii  250 mg Oral BID  . sertraline  50 mg Oral Daily   Continuous Infusions: . sodium chloride 75 mL/hr at 10/09/14 0034    Time spent on care of this patient: 71 min   Gardner, MD 10/09/2014, 12:39 PM  LOS: 1 day   [facility] Office  [phone] Pager - Text Page per [email or web address]  If 7PM-7AM, please contact night-coverage [email or web address]

## 2014-10-09 NOTE — Evaluation (Signed)
Occupational Therapy Evaluation Patient Details Name: [name] MRN: [id number] DOB: 1925/10/03 Today's Date: 10/09/2014    History of Present Illness 81 y.o. male with history of recent stroke which left patient with right-sided weakness and dysphagia was brought to the ER from the nursing home after patient was found to be increasingly weak and confused over the last 3-4 days. As per patient's family patient was doing well until last month when patient suddenly became more weaker. Last week patient's primary care physician had changed patient's aspirin to Plavix. Over the last 4 days patient has become increasingly weak and unable to ambulate and confused. CT of the head did not show anything acute. In the ER patient was afebrile. UA showing features consistent with UTI.    Clinical Impression   Pt currently confused and agitated.  Decreased awareness of place, time, or situation.  Needs max instructional cueing to participate secondary to cognitive decline.  Overall total assist for most selfcare tasks because of this.  May improve some with functional independence once cognition clears.  Will need acute OT services to help progress independence however SNF rehab will be needed at discharge.  Talked with admission coordinators on rehab and since pt left CIR earlier in the year for SNF he will likely not be a candidate for inpatient rehab services.     Follow Up Recommendations  SNF;Supervision/Assistance - 24 hour    Equipment Recommendations  None recommended by OT       Precautions / Restrictions Precautions Precautions: Fall Precaution Comments: right hemiparesis Restrictions Weight Bearing Restrictions: No      Mobility Bed Mobility Overal bed mobility: +2 for physical assistance                Transfers Overall transfer level: Needs assistance   Transfers: Sit to/from Stand;Stand Pivot Transfers Sit to Stand: Total assist;Mod assist Stand pivot transfers:  Total assist;+2 physical assistance       General transfer comment: Pt resistant to transfer so did not assist with advancing LEs for pivoting.    Balance Overall balance assessment: Needs assistance Sitting-balance support: Feet supported Sitting balance-Leahy Scale: Fair     Standing balance support: Single extremity supported Standing balance-Leahy Scale: Poor Standing balance comment: Pt maintains flexed trunk and head in standing.                            ADL Overall ADL's : Needs assistance/impaired Eating/Feeding: Supervision/ safety;Sitting   Grooming: Wash/dry face;Moderate assistance;Sitting   Upper Body Bathing: Moderate assistance;Sitting   Lower Body Bathing: +2 for physical assistance;Maximal assistance;Sit to/from stand       Lower Body Dressing: Total assistance Lower Body Dressing Details (indicate cue type and reason): for donning gripper socks Toilet Transfer: BSC;Transfer board;RW;Total assistance   Toileting- Clothing Manipulation and Hygiene: Total assistance;Maximal assistance;Sit to/from stand       Functional mobility during ADLs: +2 for physical assistance;Total assistance General ADL Comments: Pt with increased confusion not wanting to listen to therapist or follow instructions.  Wife came in during session and was able to help encourage him to participate but still limited secondary to awareness and cognition.  Pt incontinent of bowel and baldder during session.  Nursing tech assisted with cleaning pt while therapist helped with standing.      Vision Vision Assessment?: Vision impaired- to be further tested in functional context (will continue to assess.  Unable to determine at this time secondary to  cognition)   Perception Perception Perception Tested?: No   Praxis Praxis Praxis tested?: Not tested    Pertinent Vitals/Pain Pain Assessment: Faces Pain Score: 0-No pain     Hand Dominance Left (pt reports he was right hand  dominant before CVA, uncertain to facts)   Extremity/Trunk Assessment Upper Extremity Assessment Upper Extremity Assessment: RUE deficits/detail RUE Deficits / Details: Pt with history of right hemiparesis from previous CVA.  Pt presents with increased tone in the wrist, digits, elbow, and shoulder.  He demonstrates Brunnstrum stage II movement in the hand and arm bu t only tolerates gently PROM at the digits and elbow secondary to pain.  Shoulder flexion PROM0-80 degrees RUE Sensation: decreased light touch RUE Coordination: decreased fine motor;decreased gross motor   Lower Extremity Assessment Lower Extremity Assessment: Defer to PT evaluation   Cervical / Trunk Assessment Cervical / Trunk Assessment: Kyphotic   Communication     Cognition Arousal/Alertness: Awake/alert Behavior During Therapy: Agitated Overall Cognitive Status: Impaired/Different from baseline Area of Impairment: Orientation;Attention;Memory;Following commands;Awareness;Problem solving Orientation Level: Place;Time;Situation Current Attention Level: Focused Memory: Decreased short-term memory Following Commands: Follows one step commands inconsistently     Problem Solving: Requires verbal cues General Comments: Pt confused and slightly agitated unable to understand basic instructions for using urinal and what it is for.  Pt's wife present for session and attempting to help explain to pt that therapist is trying to help him.               Home Living Family/patient expects to be discharged to:: Skilled nursing facility                                        Prior Functioning/Environment               OT Diagnosis: Generalized weakness;Hemiplegia non-dominant side;Altered mental status;Paresis;Cognitive deficits   OT Problem List: Decreased strength;Decreased range of motion;Decreased activity tolerance;Impaired balance (sitting and/or standing);Decreased safety awareness;Impaired  sensation;Decreased cognition;Decreased coordination;Decreased knowledge of use of DME or AE;Impaired UE functional use   OT Treatment/Interventions: Self-care/ADL training;Therapeutic exercise;Patient/family education;Manual therapy;Balance training;Neuromuscular education;Therapeutic activities;DME and/or AE instruction;Cognitive remediation/compensation    OT Goals(Current goals can be found in the care plan section) Acute Rehab OT Goals Patient Stated Goal: Pt unable to state at this time.  OT Goal Formulation: With patient/family Time For Goal Achievement: 10/23/14 Potential to Achieve Goals: Fair  OT Frequency: Min 2X/week   Barriers to D/C: Decreased caregiver support          Co-evaluation              End of Session Nurse Communication: Need for lift equipment;Mobility status  Activity Tolerance: Treatment limited secondary to agitation Patient left: in chair;with family/visitor present;with chair alarm set   Time: [id number] OT Time Calculation (min): 44 min Charges:  OT General Charges $OT Visit: 1 Procedure OT Evaluation $Initial OT Evaluation Tier I: 1 Procedure OT Treatments $Self Care/Home Management : 23-37 mins  Darci Lykins OTR/L 10/09/2014, 10:50 AM

## 2014-10-09 NOTE — Progress Notes (Signed)
INITIAL NUTRITION ASSESSMENT  DOCUMENTATION CODES Per approved criteria  -Not Applicable   INTERVENTION: -Magic Cup TID with meals -D/c Ensure Pudding TID  NUTRITION DIAGNOSIS: Inadequate oral intake related to AMS as evidenced by diet hx, variable PO intake PTA.   Goal: Pt will meet >90% of estimated nutritional needs  Monitor:  PO/supplement intake, labs, weight changes, I/O's  Reason for Assessment: MST=2  81 y.o. male  Admitting Dx: Acute encephalopathy  [name] is a 81 y.o. male with history of recent stroke which left patient with right-sided weakness and dysphagia was brought to the ER from the nursing home after patient was found to be increasingly weak and confused over the last 3-4 days.  ASSESSMENT: Spoke with pt wife and daughter at bedside. They reports pt's swallowing difficulty was likely due to UTI and decreased mental status, but note that pt has been more alert today than he has been all week. Unable to perform nutrition focused physical exam at this time.  Pt's appetite is variable at baseline. Pt's wife reports that pt received a regular diet PTA and had just been upgraded to thin liquids recently. She reports that pt has had dysphagia for many years as a result of a stroke, but only his ability to swallow liquids was impacted. She reveals pt is a choosy eater and will often not eat foods if he does not like them or if they are presented poorly. She expressed frustration that he received some pureed foods on his lunch tray (chicken and dumplings), as he will not eat pureed texture foods. She confirmed that pt ate all of his bread and cake, and some green beans, which were not mechanically altered. Pt daughter reports that they will often substitute meals with a grilled cheese and french fries if he does not like what is offered to him.  Noted BSE completed by SLP, which recommended a dysphagia 3 diet with nectar thick liquids.  RD will add Magic Cup in  attempt to maximize nutritional intake.  Labs reviewed. WDL  Height: Ht Readings from Last 1 Encounters:  10/09/14 5\' 10"  (1.778 m)    Weight: Wt Readings from Last 1 Encounters:  10/09/14 130 lb 11.7 oz (59.3 kg)    Ideal Body Weight: 166#  % Ideal Body Weight: 81%  Wt Readings from Last 10 Encounters:  10/09/14 130 lb 11.7 oz (59.3 kg)  08/01/14 133 lb 6.1 oz (60.5 kg)    Usual Body Weight: 133#  % Usual Body Weight: 98%  BMI:  Body mass index is 18.76 kg/(m^2). Normal weight range  Estimated Nutritional Needs: Kcal: 1600-1800 Protein: 60-70 grams Fluid: 1.6-1.8 L  Skin: stage I sacral pressure ulcer  Diet Order: DIET DYS 3  EDUCATION NEEDS: -No education needs identified at this time   Intake/Output Summary (Last 24 hours) at 10/09/14 0943 Last data filed at 10/09/14 0914  Gross per 24 hour  Intake      0 ml  Output    200 ml  Net   -200 ml    Last BM: PTA  Labs:   Recent Labs Lab 10/08/14 1704 10/09/14 0040 10/09/14 0554  NA 135  --  137  K 4.0  --  4.1  CL 100  --  104  CO2 23  --  23  BUN 22  --  21  CREATININE 0.89 0.88 0.83  CALCIUM 9.6  --  9.3  GLUCOSE 92  --  98    CBG (last 3)  Recent Labs  10/08/14 1735  GLUCAP 94    Scheduled Meds: . aspirin  300 mg Rectal Daily   Or  . aspirin  325 mg Oral Daily  . atorvastatin  20 mg Oral q1800  . cefTRIAXone (ROCEPHIN)  IV  1 g Intravenous Q24H  . enoxaparin (LOVENOX) injection  40 mg Subcutaneous Q24H  . feeding supplement (ENSURE)  1 Container Oral TID WC  . polycarbophil  625 mg Oral BID  . saccharomyces boulardii  250 mg Oral BID  . sertraline  50 mg Oral Daily    Continuous Infusions: . sodium chloride 75 mL/hr at 10/09/14 0034    Past Medical History  Diagnosis Date  . Hypertension   . Cancer   . Stroke     Past Surgical History  Procedure Laterality Date  . Appendectomy    . Kidney surgery      Alexandrea Westergard A. Jimmye Norman, RD, LDN, CDE Pager: [phone] After  hours Pager: [phone]

## 2014-10-09 NOTE — Clinical Social Work Placement (Deleted)
Clinical Social Work Department CLINICAL SOCIAL WORK PLACEMENT NOTE 10/09/2014  Patient:  [name], [name]  Account Number:  [id number] Admit date:  10/08/2014  Clinical Social Worker:  Kingsley Spittle, CLINICAL SOCIAL WORKER  Date/time:  10/09/2014 11:30 AM  Clinical Social Work is seeking post-discharge placement for this patient at the following level of care:   [place]   (*CSW will update this form in Epic as items are completed)   10/09/2014  Patient/family provided with [place] Department of Clinical Social Work's list of facilities offering this level of care within the geographic area requested by the patient (or if unable, by the patient's family).  10/09/2014  Patient/family informed of their freedom to choose among providers that offer the needed level of care, that participate in Medicare, Medicaid or managed care program needed by the patient, have an available bed and are willing to accept the patient.  10/09/2014  Patient/family informed of [facility]' ownership interest in [facility], as well as of the fact that they are under no obligation to receive care at this facility.  PASARR submitted to EDS on 10/09/2014 PASARR number received on 10/09/2014  FL2 transmitted to all facilities in geographic area requested by pt/family on  10/09/2014 FL2 transmitted to all facilities within larger geographic area on   Patient informed that his/her managed care company has contracts with or will negotiate with  certain facilities, including the following:     Patient/family informed of bed offers received:   Patient chooses bed at  Physician recommends and patient chooses bed at    Patient to be transferred to  on   Patient to be transferred to facility by  Patient and family notified of transfer on  Name of family member notified:    The following physician request were entered in Epic:   Additional Comments:   Kingsley Spittle, [place] Intern,  [phone]

## 2014-10-09 NOTE — Progress Notes (Signed)
Arrived to floor via stretcher from ED admitted to room 5w35; alert to self only.  Slurred speech noticeable.  Made comfortable and instructed on how use bed functions; unable to return demonstration at this time.  Bed alarm placed.  Call bell placed within reach; will continue to monitor.

## 2014-10-09 NOTE — Clinical Social Work Psychosocial (Signed)
Clinical Social Work Department BRIEF PSYCHOSOCIAL ASSESSMENT 10/09/2014  Patient:  [name], [name]     Account Number:  [id number]     Admit date:  10/08/2014  Clinical Social Worker:  Kingsley Spittle, CLINICAL SOCIAL WORKER  Date/Time:  10/09/2014 02:40 PM  Referred by:  Physician  Date Referred:  10/09/2014 Referred for  SNF Placement   Other Referral:   N/A   Interview type:  Family Other interview type:   N/A    PSYCHOSOCIAL DATA Living Status:  FAMILY Admitted from facility:  [facility] at [facility] Level of care:  Leslie Primary support name:  Malin Cervini Primary support relationship to patient:  SPOUSE Degree of support available:   Support is strong.    CURRENT CONCERNS Current Concerns  Post-Acute Placement   Other Concerns:   N/A    SOCIAL WORK ASSESSMENT / PLAN BSW intern spoke with patient's wife Herbert Pun) about plans for discharge. The patient was at short term rehab at [place] and the wife would like for him to return once discharged. The patient's wife also mentioned multipe times that once he is able to leave [facility] she would like him to [facility]. Patient's wife had many question's about insurance about the process of transitioning to [facility] after he is able to leave [facility]. The wife seemed very involoved with the care of her husband.   Assessment/plan status:  Psychosocial Support/Ongoing Assessment of Needs Other assessment/ plan:   N/A   Information/referral to community resources:   BSW contact information given to patient's wife.    PATIENT'S/FAMILY'S RESPONSE TO PLAN OF CARE: Patient's wife is agreeable to plan of discharge. Patient will be discharged to SNF once stable. Ongoing assessment may be needed.     Kingsley Spittle, [place] Intern, [phone]

## 2014-10-09 NOTE — Evaluation (Signed)
Speech Language Pathology Evaluation Patient Details Name: [name] MRN: [id number] DOB: [birth year] Today's Date: 10/09/2014 Time: 9678-9381 SLP Time Calculation (min) (ACUTE ONLY): 25 min  Problem List:  Patient Active Problem List   Diagnosis Date Noted  . Sepsis 10/08/2014  . Acute encephalopathy 10/08/2014  . UTI (lower urinary tract infection) 10/08/2014  . Elevated troponin 10/08/2014  . Right hemiparesis 07/05/2014  . Dysphagia, post-stroke 07/05/2014  . Dysarthria due to recent cerebral infarction 07/05/2014  . Left pontine stroke 07/02/2014  . Embolic stroke involving left carotid artery 06/28/2014  . Stroke 06/28/2014  . CAP (community acquired pneumonia) 06/28/2014  . Normocytic anemia 06/28/2014  . Essential hypertension 06/28/2014  . Hypokalemia 06/28/2014   Past Medical History:  Past Medical History  Diagnosis Date  . Hypertension   . Cancer   . Stroke    Past Surgical History:  Past Surgical History  Procedure Laterality Date  . Appendectomy    . Kidney surgery     HPI:  81 yo male adm to [facility] with AMS x3 days, functional decline x 1 month, CVA January 2016 with right HP, dysphagia/dysarthria, requiring Rehab stay on CIR and at [place].  Swallow and speech evaluation ordered.     Assessment / Plan / Recommendation Clinical Impression  Pt presents with suspected near baseline dysarthria - resulting in decreased precision of speech - most notably at multisyllabic level.  Pt is amenable to cues to slow rate and overarticulate to improve clarity.  He was oriented to self, place but not date nor situation.  Pt able to articulate need to use call bell for assistance.  His understanding of higher level information re: his job hx is intact.  As pt has current level of support indicated and anticipate pt to return to SNF for furhter rehab upon dc, SLP will sign off.  Pt does report his desire for rehab is to improve his language, speech and reading abilities.   Recommend follow up at [facility].  Thanks for this order.      SLP Assessment  All further Speech Lanaguage Pathology  needs can be addressed in the next venue of care    Follow Up Recommendations  Skilled Nursing facility    Frequency and Duration   defer to SNF     Pertinent Vitals/Pain Pain Assessment: No/denies pain   SLP Goals  Patient/Family Stated Goal: to get better  SLP Evaluation Prior Functioning  Cognitive/Linguistic Baseline: Baseline deficits Baseline deficit details: h/o dysarthria and dysphagia from CVA in January 2016   Cognition  Overall Cognitive Status: No family/caregiver present to determine baseline cognitive functioning Arousal/Alertness: Awake/alert Orientation Level: Oriented to person;Oriented to place;Disoriented to time;Disoriented to situation Attention: Sustained Sustained Attention: Appears intact Awareness: Impaired Awareness Impairment: Intellectual impairment (to dysarthria, dysphagia but aware of his right sided weakness) Problem Solving: Appears intact (for basic, need to use call bell for assistance)    Comprehension  Auditory Comprehension Overall Auditory Comprehension: Appears within functional limits for tasks assessed Yes/No Questions: Not tested Commands: Within Functional Limits (for two step functional commands during ADL) Conversation: Complex Interfering Components: Hearing;Processing speed EffectiveTechniques: Repetition;Increased volume;Slowed speech Visual Recognition/Discrimination Discrimination: Not tested Reading Comprehension Reading Status:  (pt read time on clock, did not assess furhter)    Expression Expression Primary Mode of Expression: Verbal Verbal Expression Overall Verbal Expression: Appears within functional limits for tasks assessed Initiation: No impairment Level of Generative/Spontaneous Verbalization: Sentence Repetition: Impaired Level of Impairment: Word level (articulation not aphasia) Naming: No  impairment Pragmatics: No impairment Interfering Components: Speech intelligibility Written Expression Dominant Hand: Left (pt reports he was right hand dominant before CVA, uncertain to facts) Written Expression: Not tested   Oral / Motor Oral Motor/Sensory Function Overall Oral Motor/Sensory Function: Impaired at baseline (right sided lingual, labial weakness) Motor Speech Overall Motor Speech: Impaired Respiration: Impaired Level of Impairment: Sentence Phonation: Low vocal intensity Resonance: Within functional limits Articulation: Impaired Level of Impairment: Phrase Intelligibility: Intelligibility reduced (multisyllabic words increased unintelligiblity) Word: 75-100% accurate Phrase: 75-100% accurate Sentence: 50-74% accurate Conversation: Not tested Motor Planning: Witnin functional limits Motor Speech Errors: Not applicable Effective Techniques: Slow rate;Over-articulate   GO     Luanna Salk, [facility] SLP [phone]

## 2014-10-10 ENCOUNTER — Inpatient Hospital Stay (HOSPITAL_COMMUNITY): Payer: Medicare Other

## 2014-10-10 ENCOUNTER — Telehealth: Payer: Self-pay | Admitting: *Deleted

## 2014-10-10 ENCOUNTER — Encounter: Payer: Self-pay | Admitting: *Deleted

## 2014-10-10 LAB — HEMOGLOBIN A1C
HEMOGLOBIN A1C: 5.6 % (ref 4.8–5.6)
Mean Plasma Glucose: 114 mg/dL

## 2014-10-10 MED ORDER — LORAZEPAM 2 MG/ML IJ SOLN
1.0000 mg | Freq: Once | INTRAMUSCULAR | Status: AC | PRN
  Administered 2014-10-10: 1 mg via INTRAVENOUS

## 2014-10-10 MED ORDER — SODIUM CHLORIDE 0.9 % IV SOLN
INTRAVENOUS | Status: DC
  Administered 2014-10-10 – 2014-10-11 (×2): via INTRAVENOUS

## 2014-10-10 NOTE — Progress Notes (Signed)
Patient's urine is back to a  Yellowish color. [name] has been notified. Will continue to assess patient as needed.

## 2014-10-10 NOTE — Progress Notes (Signed)
Spoke with MD- asked that lovenox be held in the morning.

## 2014-10-10 NOTE — Progress Notes (Signed)
RN noticed that patient had some bloody urine earlier in the night. RN assessed patient. Patient had second urine tonight which has become more pink in color then red. MD/NP on call will be notified.

## 2014-10-10 NOTE — Evaluation (Signed)
Physical Therapy Evaluation Patient Details Name: [name] MRN: [id number] DOB: 12/30/1925 Today's Date: 10/10/2014   History of Present Illness  81 y.o. male with history of recent stroke which left patient with right-sided weakness and dysphagia was brought to the ER from the nursing home after patient was found to be increasingly weak and confused over the last 3-4 days. As per patient's family patient was doing well until last month when patient suddenly became more weaker. Last week patient's primary care physician had changed patient's aspirin to Plavix. Over the last 4 days patient has become increasingly weak and unable to ambulate and confused. CT of the head did not show anything acute. In the ER patient was afebrile. UA showing features consistent with UTI.   Clinical Impression  Pt admitted with/for increasing weakness and confusion, thought to be due in part to UTI and dehydration.  Pt currently limited functionally due to the problems listed. ( See problems list.)   Pt will benefit from PT to maximize function and safety in order to get ready for next venue listed below.     Follow Up Recommendations SNF    Equipment Recommendations  None recommended by PT;Other (comment) (TBA last venue)    Recommendations for Other Services       Precautions / Restrictions Restrictions Weight Bearing Restrictions: No      Mobility  Bed Mobility Overal bed mobility: Needs Assistance;+2 for physical assistance Bed Mobility: Supine to Sit;Sit to Supine     Supine to sit: Mod assist Sit to supine: Max assist   General bed mobility comments: Truncal assist to push up to sit and stability to start w/shift.  Transfers Overall transfer level: Needs assistance Equipment used: 1 person hand held assist;2 person hand held assist;Ambulation equipment used Transfers: Sit to/from [employer] to Stand: Mod assist;Max assist;+2 safety/equipment;+2 physical assistance  (improvements made on repeat trials of sit to stand) Stand pivot transfers: Max assist;+2 safety/equipment       General transfer comment: R knee guarded, assisto to come forward, stay symmetrical in midline and power up.  W/shift assist for pivot transfer.  Ambulation/Gait                Stairs            Wheelchair Mobility    Modified Rankin (Stroke Patients Only)       Balance Overall balance assessment: Needs assistance Sitting-balance support: Single extremity supported;No upper extremity supported Sitting balance-Leahy Scale: Fair Sitting balance - Comments: worked on w/shift to scoot, worked on sitting in midline and with upright posture.   Standing balance support: No upper extremity supported Standing balance-Leahy Scale: Poor Standing balance comment: Stood x2 for balance/stability work and during Scientist, research (life sciences).  Noticeable improvement in knee control second trial.                             Pertinent Vitals/Pain Pain Assessment: No/denies pain    Home Living Family/patient expects to be discharged to:: Skilled nursing facility                 Additional Comments: pt has lived at their second home in [place] for past two months    Prior Function Level of Independence: Independent         Comments: retired [employer]; Hydrologist for, Engineer, building services Dominance   Dominant Hand: Left  Extremity/Trunk Assessment               Lower Extremity Assessment: RLE deficits/detail;LLE deficits/detail RLE Deficits / Details: moves synergistically, grossly weak in both flexion extension.  Unable to fully extend knee against gravity or in w/b. LLE Deficits / Details: mildly weak overall, but functional  Cervical / Trunk Assessment: Kyphotic  Communication   Communication: Expressive difficulties  Cognition Arousal/Alertness: Awake/alert Behavior During Therapy: WFL for tasks assessed/performed Overall  Cognitive Status: Impaired/Different from baseline Area of Impairment: Orientation;Attention;Memory;Following commands;Awareness;Problem solving Orientation Level: Place;Time;Situation Current Attention Level: Focused Memory: Decreased short-term memory Following Commands: Follows one step commands with increased time       General Comments: Confused and relatively calm.  Followed simple commands with incr time made easier by basic cues.    General Comments      Exercises        Assessment/Plan    PT Assessment Patient needs continued PT services  PT Diagnosis Hemiplegia non-dominant side;Generalized weakness   PT Problem List Decreased strength;Decreased activity tolerance;Decreased balance;Decreased mobility;Decreased coordination;Decreased cognition;Decreased knowledge of use of DME;Decreased knowledge of precautions;Impaired sensation  PT Treatment Interventions DME instruction;Gait training;Functional mobility training;Therapeutic activities;Therapeutic exercise;Balance training;Neuromuscular re-education;Patient/family education   PT Goals (Current goals can be found in the Care Plan section) Acute Rehab PT Goals Patient Stated Goal: wife wants patient to be able to progress more to be able to go home. PT Goal Formulation: Patient unable to participate in goal setting Time For Goal Achievement: 10/24/14 Potential to Achieve Goals: Fair    Frequency Min 3X/week   Barriers to discharge        Co-evaluation               End of Session   Activity Tolerance: Patient tolerated treatment well Patient left: in bed;with call bell/phone within reach;with family/visitor present Nurse Communication: Mobility status         Time: 7846-9629 PT Time Calculation (min) (ACUTE ONLY): 42 min   Charges:   PT Evaluation $Initial PT Evaluation Tier I: 1 Procedure PT Treatments $Therapeutic Activity: 23-37 mins   PT G Codes:        Emerald Gehres, Tessie Fass 10/10/2014,  5:01 PM 10/10/2014  Donnella Sham, [place] [phone]  (pager)

## 2014-10-10 NOTE — Progress Notes (Addendum)
[facility] Progress Note   [name] TDV:[id number] DOB: 08/05/1925 DOA: 10/08/2014 PCP: Simona Huh, MD  Brief narrative: [name] is a 81 y.o. male [name] is a 81 y.o. male with history of recent stroke which left patient with right-sided weakness and dysphagia was brought to the ER from the nursing home after patient was found to be increasingly weak and confused over the last 3-4 days.  Subjective: Sleepy this AM- still quite confused.   Assessment/Plan: Principal Problem:   Acute encephalopathy- h/o confusion - possible vascular dementia based upon MRI findings this admission - suspect acute confusion from UTI and dehydration - CT head negative- MRI negative for acute infarcts but has muliltpler old lacunar infarcts and advanced microvascular disease - drowsiness today due to Ativan given in the middle of the night  Active Problems: UTI - cont Rocephin- noted to have some blood tinged urine last night which has resolved - f/u cultures  Dehydration - based upon BUN/Cr ratio-  IVF expired midnight 3/16- will resume as he has not awoken to eat today    Essential hypertension - follow BP- mildly elevated- not on meds at home- avoid strict control in this elderly individual    Elevated troponin - second TNI is lower- no chest pain- EKG without ST abnormalities - ECHO ordered by admitted MD but I don't feel we need and ECHO for such a mild increase in TNI which is improving (NOTE : new TNI Assay that lab is using is highly sensitive resulting in a number of false positives)  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 - advised to take ASA daily when discharged in Dec- now on Plavix at facility for reasons unknown to me - has received ASA on 3/15 but resumed Plavix subsequently  - resumed Dysp diet with nectar thick liquids   Appt with PCP: Code Status: full code Family Communication:  Disposition Plan: return to SNF when confusion improves DVT prophylaxis:  Lovenox Consultants: Procedures:  Antibiotics: Anti-infectives    Start     Dose/Rate Route Frequency Ordered Stop   10/09/14 2000  cefTRIAXone (ROCEPHIN) 1 g in dextrose 5 % 50 mL IVPB - Premix     1 g 100 mL/hr over 30 Minutes Intravenous Every 24 hours 10/09/14 0020     10/08/14 2000  cefTRIAXone (ROCEPHIN) 1 g in dextrose 5 % 50 mL IVPB     1 g 100 mL/hr over 30 Minutes Intravenous  Once 10/08/14 1955 10/08/14 2121      Objective: Filed Weights   10/08/14 1638 10/09/14 0014  Weight: 60.328 kg (133 lb) 59.3 kg (130 lb 11.7 oz)    Intake/Output Summary (Last 24 hours) at 10/10/14 1113 Last data filed at 10/10/14 1027  Gross per 24 hour  Intake  977.5 ml  Output    125 ml  Net  852.5 ml     Vitals Filed Vitals:   10/10/14 0233 10/10/14 0240 10/10/14 0620 10/10/14 1029  BP: 167/74 163/70 154/78 166/80  Pulse: 79 67 71 75  Temp: 97.7 F (36.5 C)  98.4 F (36.9 C) 98.7 F (37.1 C)  TempSrc: Oral  Oral Oral  Resp: 20  17 18   Height:      Weight:      SpO2: 99%  97% 98%    Exam:  General:  Pt is sleepy today, pleasant but quite confused  HEENT: No icterus, No thrush  Cardiovascular: regular rate and rhythm, S1/S2 No murmur  Respiratory: clear to auscultation  bilaterally   Abdomen: Soft, +Bowel sounds, non tender, non distended, no guarding  MSK: No LE edema, cyanosis or clubbing  Data Reviewed: Basic Metabolic Panel:  Recent Labs Lab 10/08/14 1704 10/09/14 0040 10/09/14 0554  NA 135  --  137  K 4.0  --  4.1  CL 100  --  104  CO2 23  --  23  GLUCOSE 92  --  98  BUN 22  --  21  CREATININE 0.89 0.88 0.83  CALCIUM 9.6  --  9.3   Liver Function Tests:  Recent Labs Lab 10/08/14 1704 10/09/14 0554  AST 13 15  ALT 14 11  ALKPHOS 81 77  BILITOT 0.6 0.7  PROT 6.4 6.0  ALBUMIN 3.0* 2.9*   No results for input(s): LIPASE, AMYLASE in the last 168 hours.  Recent Labs Lab 10/08/14 1704  AMMONIA 48*   CBC:  Recent Labs Lab 10/08/14 1704  10/09/14 0040 10/09/14 0554  WBC 15.7* 14.4* 11.7*  NEUTROABS 11.9*  --  8.5*  HGB 12.0* 12.0* 11.5*  HCT 36.7* 36.1* 35.2*  MCV 85.5 86.0 85.4  PLT 442* 391 364   Cardiac Enzymes:  Recent Labs Lab 10/08/14 1704 10/09/14 0040 10/09/14 0554 10/09/14 1250  TROPONINI 0.14* 0.14* 0.10* 0.11*   BNP (last 3 results) No results for input(s): BNP in the last 8760 hours.  ProBNP (last 3 results) No results for input(s): PROBNP in the last 8760 hours.  CBG:  Recent Labs Lab 10/08/14 1735  GLUCAP 94    Recent Results (from the past 240 hour(s))  Blood culture (routine x 2)     Status: None (Preliminary result)   Collection Time: 10/08/14  8:16 PM  Result Value Ref Range Status   Specimen Description BLOOD ARM LEFT  Final   Special Requests BOTTLES DRAWN AEROBIC AND ANAEROBIC 5CC  Final   Culture   Final           BLOOD CULTURE RECEIVED NO GROWTH TO DATE CULTURE WILL BE HELD FOR 5 DAYS BEFORE ISSUING A FINAL NEGATIVE REPORT Performed at [facility]    Report Status PENDING  Incomplete  Blood culture (routine x 2)     Status: None (Preliminary result)   Collection Time: 10/08/14  8:25 PM  Result Value Ref Range Status   Specimen Description BLOOD HAND LEFT  Final   Special Requests BOTTLES DRAWN AEROBIC AND ANAEROBIC 5CC  Final   Culture   Final           BLOOD CULTURE RECEIVED NO GROWTH TO DATE CULTURE WILL BE HELD FOR 5 DAYS BEFORE ISSUING A FINAL NEGATIVE REPORT Performed at [facility]    Report Status PENDING  Incomplete  MRSA PCR Screening     Status: None   Collection Time: 10/09/14  4:44 PM  Result Value Ref Range Status   MRSA by PCR NEGATIVE NEGATIVE Final    Comment:        The GeneXpert MRSA Assay (FDA approved for NASAL specimens only), is one component of a comprehensive MRSA colonization surveillance program. It is not intended to diagnose MRSA infection nor to guide or monitor treatment for MRSA infections.       Studies:  Recent x-ray studies have been reviewed in detail by the Attending Physician  Scheduled Meds:  Scheduled Meds: . atorvastatin  20 mg Oral q1800  . cefTRIAXone (ROCEPHIN)  IV  1 g Intravenous Q24H  . clopidogrel  75 mg Oral Daily  .  enoxaparin (LOVENOX) injection  40 mg Subcutaneous Q24H  . polycarbophil  625 mg Oral BID  . saccharomyces boulardii  250 mg Oral BID  . sertraline  50 mg Oral Daily   Continuous Infusions:    Time spent on care of this patient: 30 min   [place], MD 10/10/2014, 11:13 AM  LOS: 2 days   [facility] Office  [phone] Pager - Text Page per [email or web address]  If 7PM-7AM, please contact night-coverage [email or web address]

## 2014-10-10 NOTE — Telephone Encounter (Signed)
Talked with pt and told him we rescheduled his follow up appointment with 11/09/14 at 11:45 am instead of with D. Jaynee Eagles. Verbalized understanding. I also sent a reminder letter for appointment per pt request.

## 2014-10-11 DIAGNOSIS — I69391 Dysphagia following cerebral infarction: Secondary | ICD-10-CM | POA: Diagnosis not present

## 2014-10-11 DIAGNOSIS — Z8546 Personal history of malignant neoplasm of prostate: Secondary | ICD-10-CM | POA: Diagnosis not present

## 2014-10-11 DIAGNOSIS — I69322 Dysarthria following cerebral infarction: Secondary | ICD-10-CM | POA: Diagnosis not present

## 2014-10-11 DIAGNOSIS — I6789 Other cerebrovascular disease: Secondary | ICD-10-CM | POA: Diagnosis not present

## 2014-10-11 DIAGNOSIS — F329 Major depressive disorder, single episode, unspecified: Secondary | ICD-10-CM | POA: Diagnosis not present

## 2014-10-11 DIAGNOSIS — N390 Urinary tract infection, site not specified: Secondary | ICD-10-CM | POA: Diagnosis not present

## 2014-10-11 DIAGNOSIS — I69351 Hemiplegia and hemiparesis following cerebral infarction affecting right dominant side: Secondary | ICD-10-CM | POA: Diagnosis not present

## 2014-10-11 DIAGNOSIS — I639 Cerebral infarction, unspecified: Secondary | ICD-10-CM | POA: Diagnosis not present

## 2014-10-11 DIAGNOSIS — N320 Bladder-neck obstruction: Secondary | ICD-10-CM | POA: Diagnosis not present

## 2014-10-11 DIAGNOSIS — R410 Disorientation, unspecified: Secondary | ICD-10-CM | POA: Diagnosis not present

## 2014-10-11 DIAGNOSIS — C61 Malignant neoplasm of prostate: Secondary | ICD-10-CM | POA: Diagnosis not present

## 2014-10-11 DIAGNOSIS — I10 Essential (primary) hypertension: Secondary | ICD-10-CM | POA: Diagnosis not present

## 2014-10-11 DIAGNOSIS — R1319 Other dysphagia: Secondary | ICD-10-CM | POA: Diagnosis not present

## 2014-10-11 DIAGNOSIS — R7989 Other specified abnormal findings of blood chemistry: Secondary | ICD-10-CM | POA: Diagnosis not present

## 2014-10-11 DIAGNOSIS — G8110 Spastic hemiplegia affecting unspecified side: Secondary | ICD-10-CM | POA: Diagnosis not present

## 2014-10-11 DIAGNOSIS — N3001 Acute cystitis with hematuria: Secondary | ICD-10-CM | POA: Diagnosis not present

## 2014-10-11 DIAGNOSIS — E860 Dehydration: Secondary | ICD-10-CM | POA: Diagnosis not present

## 2014-10-11 DIAGNOSIS — R531 Weakness: Secondary | ICD-10-CM | POA: Diagnosis not present

## 2014-10-11 DIAGNOSIS — G9340 Encephalopathy, unspecified: Secondary | ICD-10-CM | POA: Diagnosis not present

## 2014-10-11 DIAGNOSIS — N289 Disorder of kidney and ureter, unspecified: Secondary | ICD-10-CM | POA: Diagnosis not present

## 2014-10-11 DIAGNOSIS — F0390 Unspecified dementia without behavioral disturbance: Secondary | ICD-10-CM | POA: Diagnosis not present

## 2014-10-11 LAB — CBC
HCT: 35.2 % — ABNORMAL LOW (ref 39.0–52.0)
Hemoglobin: 11.5 g/dL — ABNORMAL LOW (ref 13.0–17.0)
MCH: 27.6 pg (ref 26.0–34.0)
MCHC: 32.7 g/dL (ref 30.0–36.0)
MCV: 84.4 fL (ref 78.0–100.0)
Platelets: 373 10*3/uL (ref 150–400)
RBC: 4.17 MIL/uL — ABNORMAL LOW (ref 4.22–5.81)
RDW: 13.3 % (ref 11.5–15.5)
WBC: 8.7 10*3/uL (ref 4.0–10.5)

## 2014-10-11 LAB — URINE CULTURE: Colony Count: >=100000

## 2014-10-11 LAB — BASIC METABOLIC PANEL
ANION GAP: 8 (ref 5–15)
BUN: 14 mg/dL (ref 6–23)
CO2: 24 mmol/L (ref 19–32)
Calcium: 9.1 mg/dL (ref 8.4–10.5)
Chloride: 102 mmol/L (ref 96–112)
Creatinine, Ser: 0.78 mg/dL (ref 0.50–1.35)
GFR calc Af Amer: >90 mL/min (ref >90)
GFR calc non Af Amer: 79 mL/min — ABNORMAL LOW (ref >90)
GLUCOSE: 89 mg/dL (ref 70–99)
Potassium: 3.9 mmol/L (ref 3.5–5.1)
Sodium: 134 mmol/L — ABNORMAL LOW (ref 135–145)

## 2014-10-11 MED ORDER — CEFPODOXIME PROXETIL 200 MG PO TABS
200.0000 mg | ORAL_TABLET | Freq: Two times a day (BID) | ORAL | Status: DC
  Administered 2014-10-11: 200 mg via ORAL
  Filled 2014-10-11 (×2): qty 1

## 2014-10-11 MED ORDER — CEFPODOXIME PROXETIL 200 MG PO TABS: 200.0000 mg | ORAL_TABLET | Freq: Two times a day (BID) | ORAL | Status: DC

## 2014-10-11 NOTE — Clinical Social Work Note (Signed)
Per MD patient ready to DC back to [place]. RN, patient/family Herbert Pun), and facility notified of patient's DC. RN given number for report. DC packet on patient's chart. Ambulance transport requested for patient. CSW signing off at this time.   Liz Beach MSW, [place], [place], [phone]

## 2014-10-11 NOTE — Care Management Note (Signed)
    Page 1 of 1   10/11/2014     4:25:32 PM CARE MANAGEMENT NOTE 10/11/2014  Patient:  [name], [name]   Account Number:  [id number]  Date Initiated:  10/11/2014  Documentation initiated by:  Tomi Bamberger  Subjective/Objective Assessment:   dx ams  admit- from snf     Action/Plan:   pt -snf   Anticipated DC Date:  10/11/2014   Anticipated DC Plan:  [place]  In-house referral  Clinical Social Worker      DC Planning Services  CM consult      Choice offered to / List presented to:             Status of service:  Completed, signed off Medicare Important Message given?  YES (If response is "NO", the following Medicare IM given date fields will be blank) Date Medicare IM given:  10/11/2014 Medicare IM given by:  Tomi Bamberger Date Additional Medicare IM given:   Additional Medicare IM given by:    Discharge Disposition:  [place]  Per UR Regulation:  Reviewed for med. necessity/level of care/duration of stay  If discussed at [place] of Stay Meetings, dates discussed:    Comments:  10/11/14 [place], BSN [phone] patient for dc to snf today.

## 2014-10-11 NOTE — Progress Notes (Signed)
Medicare Important Message given?  YES (If response is "NO", the following Medicare IM given date fields will be blank) Date Medicare IM given:  10/11/14 Medicare IM given by:  Kaly Mcquary 

## 2014-10-11 NOTE — Progress Notes (Signed)
NURSING PROGRESS NOTE  TRE SANKER [id number] Discharge Data: 10/11/2014 2:48 PM Attending Provider: Debbe Odea, MD BSJ:[id number],HUTMLY R, MD   Lorrene Reid to be D/C'd Rehab per MD order.    All IV's will be discontinued and monitored for bleeding.  All belongings will be returned to patient for patient to take home.  Last Documented Vital Signs:  Blood pressure 138/71, pulse 69, temperature 98.1 F (36.7 C), temperature source Oral, resp. rate 16, height 5\' 10"  (1.778 m), weight 59.3 kg (130 lb 11.7 oz), SpO2 97 %.  Joslyn Hy, MSN, RN, [employer]

## 2014-10-11 NOTE — Discharge Summary (Signed)
Physician Discharge Summary  [name] AUQ:[id number] DOB: 12-10-1925 DOA: 10/08/2014  PCP: Simona Huh, MD  Admit date: 10/08/2014 Discharge date: 10/11/2014  Time spent: 45 minutes  Recommendations for Outpatient Follow-up:  1. Stop Vantin on 3/20  Discharge Condition: stable Diet recommendation: heart healthy  Discharge Diagnoses:  Principal Problem:   Acute encephalopathy Active Problems:   Normocytic anemia   Essential hypertension   UTI (lower urinary tract infection)   Elevated troponin   History of present illness:  [name] is a 81 y.o. male [name] is a 81 y.o. male with history of recent stroke which left patient with right-sided weakness and dysphagia was brought to the ER from the nursing home after patient was found to be increasingly weak and confused over the last 3-4 days.  Hospital Course:  Principal Problem:  Acute encephalopathy- h/o confusion - possible vascular dementia based upon MRI findings this admission - suspect acute confusion from UTI and dehydration - CT head negative- MRI negative for acute infarcts but has muliltpler old lacunar infarcts and advanced microvascular disease - drowsiness on 3/16 due to Ativan given in the middle of the night for MRI - alert again today- much less confused- per wife, confusion had significantly improved by day 2- she has yet to see him today  Active Problems: UTI/ leukocytosis - has been on Rocephin- noted to have some blood tinged urine intermittantly - f/u cultures reveal Aerococcus Viridans- will follow sensitivities but he has improved with Rocephin- transitioned to Vantin - will complete 7 day course - WBC improved from 14.4 to 8.7  Dehydration - based upon BUN/Cr ratio and exam- resolved with IV hydatrion   Essential hypertension - follow BP- mildly elevated- not on meds at home- avoid strict control in this elderly individual   Elevated troponin - second TNI is lower- no  chest pain- EKG without ST abnormalities - ECHO ordered by admitted MD but I don't feel we need and ECHO for such a mild increase in TNI which is improving (NOTE : new TNI Assay that lab is using is highly sensitive resulting in a number of false positives)  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 - advised to take ASA daily when discharged in Dec- now on Plavix at facility for reasons unknown to me - has received ASA on 3/15 but resumed Plavix subsequently  - resumed Dysp diet with nectar thick liquids  Consultations:  none  Discharge Exam: Filed Weights   10/08/14 1638 10/09/14 0014  Weight: 60.328 kg (133 lb) 59.3 kg (130 lb 11.7 oz)   Filed Vitals:   10/11/14 0913  BP: 139/63  Pulse: 71  Temp: 97.9 F (36.6 C)  Resp: 16    General: AAO x 3, no distress Cardiovascular: RRR, no murmurs  Respiratory: clear to auscultation bilaterally GI: soft, non-tender, non-distended, bowel sound positive  Discharge Instructions You were cared for by a hospitalist during your hospital stay. If you have any questions about your discharge medications or the care you received while you were in the hospital after you are discharged, you can call the unit and asked to speak with the hospitalist on call if the hospitalist that took care of you is not available. Once you are discharged, your primary care physician will handle any further medical issues. Please note that NO REFILLS for any discharge medications will be authorized once you are discharged, as it is imperative that you return to your primary care physician (or establish a relationship with a  primary care physician if you do not have one) for your aftercare needs so that they can reassess your need for medications and monitor your lab values.      Discharge Instructions    Diet - low sodium heart healthy    Complete by:  As directed      Diet - low sodium heart healthy    Complete by:  As directed      Increase activity slowly    Complete by:  As  directed      Increase activity slowly    Complete by:  As directed             Medication List    TAKE these medications        acetaminophen 325 MG tablet  Commonly known as:  TYLENOL  Take 2 tablets (650 mg total) by mouth every 6 (six) hours as needed for mild pain.     atorvastatin 20 MG tablet  Commonly known as:  LIPITOR  Take 1 tablet (20 mg total) by mouth daily at 6 PM.     baclofen 5 mg Tabs tablet  Commonly known as:  LIORESAL  Take 0.5 tablets (5 mg total) by mouth 3 (three) times daily as needed (hiccups).     cefpodoxime 200 MG tablet  Commonly known as:  VANTIN  Take 1 tablet (200 mg total) by mouth every 12 (twelve) hours.     CHOLECALCIFEROL PO  Take 50,000 Units by mouth once a week.     clopidogrel 75 MG tablet  Commonly known as:  PLAVIX  Take 1 tablet (75 mg total) by mouth daily.     feeding supplement (ENSURE) Pudg  Take 1 Container by mouth 3 (three) times daily with meals.     ipratropium-albuterol 0.5-2.5 (3) MG/3ML Soln  Commonly known as:  DUONEB  Take 3 mLs by nebulization every 4 (four) hours as needed.     pantoprazole 40 MG tablet  Commonly known as:  PROTONIX  Take 1 tablet (40 mg total) by mouth daily.     polycarbophil 625 MG tablet  Commonly known as:  FIBERCON  Take 1 tablet (625 mg total) by mouth 2 (two) times daily.     saccharomyces boulardii 250 MG capsule  Commonly known as:  FLORASTOR  Take 1 capsule (250 mg total) by mouth 2 (two) times daily.     senna-docusate 8.6-50 MG per tablet  Commonly known as:  Senokot-S  Take 1 tablet by mouth at bedtime as needed for mild constipation.     sertraline 50 MG tablet  Commonly known as:  ZOLOFT  Take 50 mg by mouth daily.     white petrolatum Gel  Commonly known as:  VASELINE  Apply 1 application topically 2 (two) times daily.       No Known Allergies    The results of significant diagnostics from this hospitalization (including imaging, microbiology,  ancillary and laboratory) are listed below for reference.    Significant Diagnostic Studies: Dg Chest 2 View  10/08/2014   CLINICAL DATA:  Altered level of consciousness, hypertension, incontinence since 10/06/2014  EXAM: CHEST  2 VIEW  COMPARISON:  06/2014  FINDINGS: Heart size and vascular pattern normal. No consolidation or effusion. Lateral radiograph is of limited diagnostic utility given lack of patient cooperation. Mildly increased bibasilar markings less prominent when compared to the prior study, most consistent with mild atelectasis or scarring.  IMPRESSION: No active cardiopulmonary disease.   Electronically Signed  By: Skipper Cliche M.D.   On: 10/08/2014 18:03   Ct Head Wo Contrast  10/08/2014   CLINICAL DATA:  81 year old male with altered mental status. Image quality limited secondary to patient motion  EXAM: CT HEAD WITHOUT CONTRAST  TECHNIQUE: Contiguous axial images were obtained from the base of the skull through the vertex without intravenous contrast.  COMPARISON:  Most recent prior brain MRI 06/28/2014; Prior head CT also 06/28/2014  FINDINGS: Negative for acute intracranial hemorrhage, acute infarction, mass, mass effect, hydrocephalus or midline shift. Gray-white differentiation is preserved throughout. New stable atrophy. Confluent periventricular and subcortical white matter hypoattenuation is similar compared to prior and consistent with chronic microvascular ischemic white matter disease of moderate to advanced severity. Small lacunar infarcts are present within both basal ganglia and are unchanged. No focal calvarial or soft tissue abnormality. Normal aeration of the mastoid air cells and visualized paranasal sinuses.  IMPRESSION: 1. No acute intracranial abnormality. 2. Stable atrophy and advanced chronic microvascular ischemic white matter disease.   Electronically Signed   By: Jacqulynn Cadet M.D.   On: 10/08/2014 18:17   Mr Brain Wo Contrast  10/10/2014   CLINICAL DATA:   Initial evaluation for acute confusion, encephalopathy.  EXAM: MRI HEAD WITHOUT CONTRAST  TECHNIQUE: Multiplanar, multiecho pulse sequences of the brain and surrounding structures were obtained without intravenous contrast.  COMPARISON:  Prior CT from 10/08/2014 as well as previous MRI from 06/28/2014  FINDINGS: Study degraded by motion artifact.  Diffuse prominence of the CSF containing spaces compatible with generalized cerebral atrophy. Extensive scattered and confluent T2/FLAIR hyperintensity within the periventricular and deep white matter both cerebral hemispheres most consistent with chronic small vessel ischemic changes. Remote lacunar infarcts present within the bilateral basal ganglia. Prominent remote infarct present within left paramedian pons.  No abnormal foci of restricted diffusion to suggest acute intracranial infarct. Gray-white matter differentiation maintained. Normal intravascular flow voids present. No acute or chronic intracranial hemorrhage.  No mass lesion or midline shift. Ventricular prominence related to global parenchymal volume loss present without hydrocephalus. No extra-axial fluid collection.  Craniocervical junction within normal limits. Pituitary gland normal.  No acute abnormality seen about the orbits.  Paranasal sinuses and mastoid air cells are clear.  Bone marrow signal intensity normal. Scalp soft tissues within normal limits.  IMPRESSION: 1. No acute intracranial infarct or other abnormality identified. 2. Multiple remote lacunar infarcts involving the bilateral basal ganglia and left paramedian pons. 3. Generalized cerebral atrophy with advanced chronic microvascular ischemic disease.   Electronically Signed   By: Jeannine Boga M.D.   On: 10/10/2014 02:24    Microbiology: Recent Results (from the past 240 hour(s))  Urine culture     Status: None   Collection Time: 10/08/14  6:25 PM  Result Value Ref Range Status   Specimen Description URINE, CLEAN CATCH   Final   Special Requests ADDED [id number]  Final   Colony Count   Final    >=100,000 COLONIES/ML Performed at [facility]    Culture   Final    AEROCOCCUS VIRIDANS Performed at [facility]    Report Status 10/11/2014 FINAL  Final  Blood culture (routine x 2)     Status: None (Preliminary result)   Collection Time: 10/08/14  8:16 PM  Result Value Ref Range Status   Specimen Description BLOOD ARM LEFT  Final   Special Requests BOTTLES DRAWN AEROBIC AND ANAEROBIC 5CC  Final   Culture   Final  BLOOD CULTURE RECEIVED NO GROWTH TO DATE CULTURE WILL BE HELD FOR 5 DAYS BEFORE ISSUING A FINAL NEGATIVE REPORT Performed at [facility]    Report Status PENDING  Incomplete  Blood culture (routine x 2)     Status: None (Preliminary result)   Collection Time: 10/08/14  8:25 PM  Result Value Ref Range Status   Specimen Description BLOOD HAND LEFT  Final   Special Requests BOTTLES DRAWN AEROBIC AND ANAEROBIC 5CC  Final   Culture   Final           BLOOD CULTURE RECEIVED NO GROWTH TO DATE CULTURE WILL BE HELD FOR 5 DAYS BEFORE ISSUING A FINAL NEGATIVE REPORT Performed at [facility]    Report Status PENDING  Incomplete  MRSA PCR Screening     Status: None   Collection Time: 10/09/14  4:44 PM  Result Value Ref Range Status   MRSA by PCR NEGATIVE NEGATIVE Final    Comment:        The GeneXpert MRSA Assay (FDA approved for NASAL specimens only), is one component of a comprehensive MRSA colonization surveillance program. It is not intended to diagnose MRSA infection nor to guide or monitor treatment for MRSA infections.      Labs: Basic Metabolic Panel:  Recent Labs Lab 10/08/14 1704 10/09/14 0040 10/09/14 0554 10/11/14 0645  NA 135  --  137 134*  K 4.0  --  4.1 3.9  CL 100  --  104 102  CO2 23  --  23 24  GLUCOSE 92  --  98 89  BUN 22  --  21 14  CREATININE 0.89 0.88 0.83 0.78  CALCIUM 9.6  --  9.3 9.1   Liver Function  Tests:  Recent Labs Lab 10/08/14 1704 10/09/14 0554  AST 13 15  ALT 14 11  ALKPHOS 81 77  BILITOT 0.6 0.7  PROT 6.4 6.0  ALBUMIN 3.0* 2.9*   No results for input(s): LIPASE, AMYLASE in the last 168 hours.  Recent Labs Lab 10/08/14 1704  AMMONIA 48*   CBC:  Recent Labs Lab 10/08/14 1704 10/09/14 0040 10/09/14 0554 10/11/14 0645  WBC 15.7* 14.4* 11.7* 8.7  NEUTROABS 11.9*  --  8.5*  --   HGB 12.0* 12.0* 11.5* 11.5*  HCT 36.7* 36.1* 35.2* 35.2*  MCV 85.5 86.0 85.4 84.4  PLT 442* 391 364 373   Cardiac Enzymes:  Recent Labs Lab 10/08/14 1704 10/09/14 0040 10/09/14 0554 10/09/14 1250  TROPONINI 0.14* 0.14* 0.10* 0.11*   BNP: BNP (last 3 results) No results for input(s): BNP in the last 8760 hours.  ProBNP (last 3 results) No results for input(s): PROBNP in the last 8760 hours.  CBG:  Recent Labs Lab 10/08/14 1735  GLUCAP 94       SignedDebbe Odea, MD [facility] 10/11/2014, 12:34 PM

## 2014-10-15 ENCOUNTER — Other Ambulatory Visit: Payer: BLUE CROSS/BLUE SHIELD

## 2014-10-15 DIAGNOSIS — I6789 Other cerebrovascular disease: Secondary | ICD-10-CM | POA: Diagnosis not present

## 2014-10-15 DIAGNOSIS — N390 Urinary tract infection, site not specified: Secondary | ICD-10-CM | POA: Diagnosis not present

## 2014-10-15 DIAGNOSIS — F0390 Unspecified dementia without behavioral disturbance: Secondary | ICD-10-CM | POA: Diagnosis not present

## 2014-10-15 DIAGNOSIS — R1319 Other dysphagia: Secondary | ICD-10-CM | POA: Diagnosis not present

## 2014-10-15 LAB — CULTURE, BLOOD (ROUTINE X 2)
Culture: NO GROWTH
Culture: NO GROWTH

## 2014-10-18 DIAGNOSIS — N320 Bladder-neck obstruction: Secondary | ICD-10-CM | POA: Diagnosis not present

## 2014-10-18 DIAGNOSIS — C61 Malignant neoplasm of prostate: Secondary | ICD-10-CM | POA: Diagnosis not present

## 2014-10-18 DIAGNOSIS — N3001 Acute cystitis with hematuria: Secondary | ICD-10-CM | POA: Diagnosis not present

## 2014-11-02 ENCOUNTER — Ambulatory Visit: Payer: BLUE CROSS/BLUE SHIELD | Admitting: Neurology

## 2014-11-05 DIAGNOSIS — N3001 Acute cystitis with hematuria: Secondary | ICD-10-CM | POA: Diagnosis not present

## 2014-11-05 DIAGNOSIS — C61 Malignant neoplasm of prostate: Secondary | ICD-10-CM | POA: Diagnosis not present

## 2014-11-08 ENCOUNTER — Ambulatory Visit: Payer: Self-pay | Admitting: Neurology

## 2014-11-09 ENCOUNTER — Ambulatory Visit: Payer: Self-pay | Admitting: Neurology

## 2014-11-09 ENCOUNTER — Encounter: Payer: Self-pay | Admitting: Neurology

## 2014-11-09 ENCOUNTER — Ambulatory Visit (INDEPENDENT_AMBULATORY_CARE_PROVIDER_SITE_OTHER): Payer: Medicare Other | Admitting: Neurology

## 2014-11-09 VITALS — BP 115/63 | HR 72 | Resp 20 | Wt 132.0 lb

## 2014-11-09 DIAGNOSIS — G8110 Spastic hemiplegia affecting unspecified side: Secondary | ICD-10-CM

## 2014-11-09 DIAGNOSIS — I639 Cerebral infarction, unspecified: Secondary | ICD-10-CM

## 2014-11-09 NOTE — Progress Notes (Signed)
[facility]    Provider:  Dr Jaynee Eagles Referring Provider: Gaynelle Arabian, MD Primary Care Physician:  Simona Huh, MD  CC:  Stroke and confusion  HPI:  [name] is a 81 y.o. male here as a referral from Dr. Marisue Humble for stroke follow up Update 11/09/2014 : He returns for follow-up after last visit 1 month ago. He was hospitalized for a few days at [facility] and found to have some dehydration as well as urinary tract infection. MRI scan of the brain was obtained which are personally reviewed showed no acute infarct but old lacunar infarcts as well as left pontine infarct. He has made some slow improvement and his confusion and encephalopathy have resolved. He is able to walk with a hemiwalker with [facility] physical therapist. He has a right foot drop but can walk with her AFO. He has regained some strength in his hand but still has significant weakness. He plans to go home next week and home health has been set up. Family is requesting a prescription for portable wheelchair ramp. The patient, wife and daughter had lots of questions about his care and future risk for strokes and answered these. Last visit  10/10/2014 ( Dr Jaynee Eagles ) [name] is a 81 y.o. left handed male with history of HTN, prostate cancer, recent PNA with malaise X 1 week who was admitted on 06/27/14 with right sided weakness with right gaze preference and difficulty talking.  Patient was noted to be in A fib in setting of CAP. 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 Neurology followed for input and recommended ASA for thrombotic infarct due to small vessel disease.MBS revealed dysphagia and he was started on dysphagia 1 diet with nectar liquids but continues to have coughing episodes due to difficulty handling secretions  He was doing well, but over the weekend noticed a change in walking. Since then it has not been the  same, about 5 weeks ago. He is walking less. He is more confused. He has a dullness. He is at [facility] and he is not having mental stimulation. He has no social interaction. Since January he is having a cognitive decline. [place] and his wife provide the information. He didn't remember seeing his daughter. He is walking some but he can't go up 4 steps. He used to read the newspapers every day, he has lost interest. This has been going on for longer than 4 weeks. He was having difficulty swallowing, he is on a soft diet at the facility. The right arm is still weak, the leg has improved. He is on an SSRI for depression.   Reviewed notes, labs and imaging from outside physicians, which showed:  Ct Angio Head and Neck W/cm &/or Wo Cm 06/28/2014  CTA HEAD: Moderately motion degraded examination limits evaluation without large vessel occlusion. Mild luminal irregularity in a pattern suggesting atherosclerosis.  CTA NECK: Calcific atherosclerosis of the carotid bulbs without hemodynamically significant stenosis by NASCET criteria.  LEFT V1 and V2 intimal irregularity suggesting remote dissection without convincing evidence of acute vascular injury nor flow-limiting stenosis.     Dg Chest 2 View 06/27/2014  Right lower lobe and right middle lobe infiltrates consistent with pneumonia.    Ct Head (brain) Wo Contrast 06/27/2014  No evidence of acute intracranial abnormality. Atrophy, chronic small-vessel white matter ischemic changes and remote appearing right basal ganglia lacunar infarct.    MRI / MRA Brain Wo Contrast (personally  reviewed images and agree with results) 06/28/2014  1. Acute nonhemorrhagic left paramedian pontine infarct corresponds with the patient's symptoms.  2. No acute supratentorial infarcts.  3. Extensive atrophy and white matter disease likely reflects the sequela of chronic microvascular ischemia.  4. Atherosclerotic changes in the vertebral arteries  and basilar artery without a significant stenosis.  5. Mild distal small vessel attenuation throughout, worse in the posterior circulation.    Dg Chest Port 1 View 06/28/2014  Mildly worsening right basilar pneumonia noted.    Carotid Doppler There is 1-39% bilateral ICA stenosis. Vertebral artery flow is antegrade.   2-D echocardiogram 06/28/2014  - Left ventricle: The cavity size was normal. There was mild focal basal hypertrophy of the septum. Systolic function was normal. The estimated ejection fraction was in the range of 55% to 60%. Doppler parameters are consistent with abnormal left ventricular relaxation (grade 1 diastolic dysfunction).  - No cardiac source of emboli was indentified.   Review of Systems: Patient complains of symptoms per HPI as well as the following symptoms: Frequent urination, depression, daytime sleepiness, trouble swallowing, runny nose, gait difficulty and weakness Pertinent negatives per HPI. All others negative.   History   Social History  . Marital Status: Married    Spouse Name: Herbert Pun  . Number of Children: 2  . Years of Education: MBA   Occupational History  . Not on file.   Social History Main Topics  . Smoking status: Former Research scientist (life sciences)  . Smokeless tobacco: Not on file  . Alcohol Use: 0.0 oz/week    0 Standard drinks or equivalent per week     Comment: Occasionally  . Drug Use: No  . Sexual Activity: Not on file   Other Topics Concern  . Not on file   Social History Narrative   Live at [employer].   Caffeine: Drinks 2 diet pepsi per week.    Drinks coffee once per week.     Family History  Problem Relation Age of Onset  . CAD Father     Past Medical History  Diagnosis Date  . Hypertension   . Cancer   . Stroke     Past Surgical History  Procedure Laterality Date  . Appendectomy    . Kidney surgery      Current Outpatient Prescriptions  Medication Sig Dispense Refill  . acetaminophen  (TYLENOL) 325 MG tablet Take 2 tablets (650 mg total) by mouth every 6 (six) hours as needed for mild pain.    Marland Kitchen atorvastatin (LIPITOR) 20 MG tablet Take 1 tablet (20 mg total) by mouth daily at 6 PM.    . baclofen (LIORESAL) 5 mg TABS tablet Take 0.5 tablets (5 mg total) by mouth 3 (three) times daily as needed (hiccups).    . CHOLECALCIFEROL PO Take 50,000 Units by mouth once a week.    . clopidogrel (PLAVIX) 75 MG tablet Take 1 tablet (75 mg total) by mouth daily. 30 tablet 11  . escitalopram (LEXAPRO) 20 MG tablet Take 20 mg by mouth daily.    . feeding supplement, ENSURE, (ENSURE) PUDG Take 1 Container by mouth 3 (three) times daily with meals.  0  . ipratropium-albuterol (DUONEB) 0.5-2.5 (3) MG/3ML SOLN Take 3 mLs by nebulization every 4 (four) hours as needed. 360 mL   . nitrofurantoin (MACRODANTIN) 50 MG capsule Take 50 mg by mouth at bedtime.    . polycarbophil (FIBERCON) 625 MG tablet Take 1 tablet (625 mg total) by mouth 2 (two) times daily.    Marland Kitchen  saccharomyces boulardii (FLORASTOR) 250 MG capsule Take 1 capsule (250 mg total) by mouth 2 (two) times daily.    Marland Kitchen senna-docusate (SENOKOT-S) 8.6-50 MG per tablet Take 1 tablet by mouth at bedtime as needed for mild constipation.    . tamsulosin (FLOMAX) 0.4 MG CAPS capsule Take 0.4 mg by mouth daily.    . white petrolatum (VASELINE) GEL Apply 1 application topically 2 (two) times daily.  0   No current facility-administered medications for this visit.    Allergies as of 11/09/2014  . (No Known Allergies)    Vitals: BP 115/63 mmHg  Pulse 72  Resp 20  Wt 132 lb (59.875 kg) Last Weight:  Wt Readings from Last 1 Encounters:  11/09/14 132 lb (59.875 kg)   Last Height:   Ht Readings from Last 1 Encounters:  10/09/14 5\' 10"  (1.778 m)    Physical exam: Exam: Gen: NAD, sitting in electric wheelchair     Frail elderly caucasian male               CV:  No murmur or gallop . No Carotid Bruits. No peripheral edema, warm,  nontender Eyes: Conjunctivae clear without exudates or hemorrhage  Neuro: Detailed Neurologic Exam  Speech:    Minimal dysarthria Cognition:      The patient is oriented to person, follows one-step commands    recent and remote memory impaired;     Impaired attention, concentration,     fund of knowledge Cranial Nerves:    The pupils are equal, round, and reactive to light. Attempted to visualize fundi but pupils small. Visual fields are full to threat. No gaze preference, extraocular movements are full. Trigeminal sensation is intact and the muscles of mastication are normal.right facial droop.  The palate elevates in the midline. Hearing impaired. Voice is normal. Shoulder shrug is normal. The tongue has normal motion without fasciculations.   Coordination:    Attempted, cannot perform due to weakness  Gait:    Attempted, cannot perform due to weakness  Motor Observation:    no involuntary movements noted. Tone:    Right spasticity  Posture:    Posture is erect in wheelchair    Strength: Spatic right hemiplegia 2/5 strength RUE with significant grip weakness and 3/5 RLE with right foot drop and and ankle DF 0/5. Good strength on left side.  Sensation: right hemi-sensory loss     Reflex Exam:  DTR's: spastic on the right     Toes:    upgoing left Clonus:    Clonus is absent.     ASSESSMENT/PLAN GRIFFEN FRAYNE is a 81 y.o. left handed male with history of HTN, prostate cancer, recent PNA with malaise X 1 week who was admitted on 06/27/14 with right sided weakness with right gaze preference and difficulty talking.  Patient was noted to be in transient A fib in setting of CAP. MRI/MRA head with acute nonhemorrhagic left paramedian pontine with extensive atrophy and white matter disease.   Patient with subsequent residual significant right hemiparesis  PLAN :  I had a long d/w patient, wife and d aughter about his   stroke, risk for recurrent stroke/TIAs, personally  independently reviewed imaging studies and stroke evaluation results and answered questions.Continue Plavix  for secondary stroke prevention and maintain strict control of hypertension with blood pressure goal below 130/90,   and lipids with LDL cholesterol goal below 100 mg/dL. continue ongoing physical and occupational therapy and transition to home therapy when he goes home followed  by outpatient therapies. Patient was given a prescription for home ramp upon request Followup in the future with me in 6 months or call earlier if necessary.  Antony Contras, MD  [facility] [address] [place], [place] [address]  Phone [phone] Fax [phone]

## 2014-11-09 NOTE — Patient Instructions (Signed)
I had a long d/w patient, wife and d aughter about his   stroke, risk for recurrent stroke/TIAs, personally independently reviewed imaging studies and stroke evaluation results and answered questions.Continue Plavix  for secondary stroke prevention and maintain strict control of hypertension with blood pressure goal below 130/90,   and lipids with LDL cholesterol goal below 100 mg/dL. continue ongoing physical and occupational therapy and transition to home therapy when he goes home followed by outpatient therapies. Patient was given a prescription for home ramp upon request Followup in the future with me in 6 months or call earlier if necessary. Stroke Prevention Some medical conditions and behaviors are associated with an increased chance of having a stroke. You may prevent a stroke by making healthy choices and managing medical conditions. HOW CAN I REDUCE MY RISK OF HAVING A STROKE?   Stay physically active. Get at least 30 minutes of activity on most or all days.  Do not smoke. It may also be helpful to avoid exposure to secondhand smoke.  Limit alcohol use. Moderate alcohol use is considered to be:  No more than 2 drinks per day for men.  No more than 1 drink per day for nonpregnant women.  Eat healthy foods. This involves:  Eating 5 or more servings of fruits and vegetables a day.  Making dietary changes that address high blood pressure (hypertension), high cholesterol, diabetes, or obesity.  Manage your cholesterol levels.  Making food choices that are high in fiber and low in saturated fat, trans fat, and cholesterol may control cholesterol levels.  Take any prescribed medicines to control cholesterol as directed by your health care provider.  Manage your diabetes.  Controlling your carbohydrate and sugar intake is recommended to manage diabetes.  Take any prescribed medicines to control diabetes as directed by your health care provider.  Control your hypertension.  Making  food choices that are low in salt (sodium), saturated fat, trans fat, and cholesterol is recommended to manage hypertension.  Take any prescribed medicines to control hypertension as directed by your health care provider.  Maintain a healthy weight.  Reducing calorie intake and making food choices that are low in sodium, saturated fat, trans fat, and cholesterol are recommended to manage weight.  Stop drug abuse.  Avoid taking birth control pills.  Talk to your health care provider about the risks of taking birth control pills if you are over 53 years old, smoke, get migraines, or have ever had a blood clot.  Get evaluated for sleep disorders (sleep apnea).  Talk to your health care provider about getting a sleep evaluation if you snore a lot or have excessive sleepiness.  Take medicines only as directed by your health care provider.  For some people, aspirin or blood thinners (anticoagulants) are helpful in reducing the risk of forming abnormal blood clots that can lead to stroke. If you have the irregular heart rhythm of atrial fibrillation, you should be on a blood thinner unless there is a good reason you cannot take them.  Understand all your medicine instructions.  Make sure that other conditions (such as anemia or atherosclerosis) are addressed. SEEK IMMEDIATE MEDICAL CARE IF:   You have sudden weakness or numbness of the face, arm, or leg, especially on one side of the body.  Your face or eyelid droops to one side.  You have sudden confusion.  You have trouble speaking (aphasia) or understanding.  You have sudden trouble seeing in one or both eyes.  You have sudden trouble  walking.  You have dizziness.  You have a loss of balance or coordination.  You have a sudden, severe headache with no known cause.  You have new chest pain or an irregular heartbeat. Any of these symptoms may represent a serious problem that is an emergency. Do not wait to see if the symptoms  will go away. Get medical help at once. Call your local emergency services (911 in [place] Do not drive yourself to the hospital. Document Released: 08/20/2004 Document Revised: 11/27/2013 Document Reviewed: 01/13/2013 [facility] Patient Information 2015 [place], [place]. This information is not intended to replace advice given to you by your health care provider. Make sure you discuss any questions you have with your health care provider.

## 2014-11-15 DIAGNOSIS — R413 Other amnesia: Secondary | ICD-10-CM | POA: Diagnosis not present

## 2014-11-15 DIAGNOSIS — I10 Essential (primary) hypertension: Secondary | ICD-10-CM | POA: Diagnosis not present

## 2014-11-15 DIAGNOSIS — I69351 Hemiplegia and hemiparesis following cerebral infarction affecting right dominant side: Secondary | ICD-10-CM | POA: Diagnosis not present

## 2014-11-15 DIAGNOSIS — I69391 Dysphagia following cerebral infarction: Secondary | ICD-10-CM | POA: Diagnosis not present

## 2014-11-15 DIAGNOSIS — Z8546 Personal history of malignant neoplasm of prostate: Secondary | ICD-10-CM | POA: Diagnosis not present

## 2014-11-15 DIAGNOSIS — R1310 Dysphagia, unspecified: Secondary | ICD-10-CM | POA: Diagnosis not present

## 2014-11-19 DIAGNOSIS — R413 Other amnesia: Secondary | ICD-10-CM | POA: Diagnosis not present

## 2014-11-19 DIAGNOSIS — R1310 Dysphagia, unspecified: Secondary | ICD-10-CM | POA: Diagnosis not present

## 2014-11-19 DIAGNOSIS — I69391 Dysphagia following cerebral infarction: Secondary | ICD-10-CM | POA: Diagnosis not present

## 2014-11-19 DIAGNOSIS — Z8546 Personal history of malignant neoplasm of prostate: Secondary | ICD-10-CM | POA: Diagnosis not present

## 2014-11-19 DIAGNOSIS — I10 Essential (primary) hypertension: Secondary | ICD-10-CM | POA: Diagnosis not present

## 2014-11-19 DIAGNOSIS — I69351 Hemiplegia and hemiparesis following cerebral infarction affecting right dominant side: Secondary | ICD-10-CM | POA: Diagnosis not present

## 2014-11-21 DIAGNOSIS — R1310 Dysphagia, unspecified: Secondary | ICD-10-CM | POA: Diagnosis not present

## 2014-11-21 DIAGNOSIS — R413 Other amnesia: Secondary | ICD-10-CM | POA: Diagnosis not present

## 2014-11-21 DIAGNOSIS — I69351 Hemiplegia and hemiparesis following cerebral infarction affecting right dominant side: Secondary | ICD-10-CM | POA: Diagnosis not present

## 2014-11-21 DIAGNOSIS — Z8546 Personal history of malignant neoplasm of prostate: Secondary | ICD-10-CM | POA: Diagnosis not present

## 2014-11-21 DIAGNOSIS — I10 Essential (primary) hypertension: Secondary | ICD-10-CM | POA: Diagnosis not present

## 2014-11-21 DIAGNOSIS — I69391 Dysphagia following cerebral infarction: Secondary | ICD-10-CM | POA: Diagnosis not present

## 2014-11-22 DIAGNOSIS — I69391 Dysphagia following cerebral infarction: Secondary | ICD-10-CM | POA: Diagnosis not present

## 2014-11-22 DIAGNOSIS — Z8546 Personal history of malignant neoplasm of prostate: Secondary | ICD-10-CM | POA: Diagnosis not present

## 2014-11-22 DIAGNOSIS — R413 Other amnesia: Secondary | ICD-10-CM | POA: Diagnosis not present

## 2014-11-22 DIAGNOSIS — R1310 Dysphagia, unspecified: Secondary | ICD-10-CM | POA: Diagnosis not present

## 2014-11-22 DIAGNOSIS — I10 Essential (primary) hypertension: Secondary | ICD-10-CM | POA: Diagnosis not present

## 2014-11-22 DIAGNOSIS — I69351 Hemiplegia and hemiparesis following cerebral infarction affecting right dominant side: Secondary | ICD-10-CM | POA: Diagnosis not present

## 2014-11-23 DIAGNOSIS — I6350 Cerebral infarction due to unspecified occlusion or stenosis of unspecified cerebral artery: Secondary | ICD-10-CM | POA: Diagnosis not present

## 2014-11-23 DIAGNOSIS — E785 Hyperlipidemia, unspecified: Secondary | ICD-10-CM | POA: Diagnosis not present

## 2014-11-23 DIAGNOSIS — G8191 Hemiplegia, unspecified affecting right dominant side: Secondary | ICD-10-CM | POA: Diagnosis not present

## 2014-11-23 DIAGNOSIS — R471 Dysarthria and anarthria: Secondary | ICD-10-CM | POA: Diagnosis not present

## 2014-11-23 DIAGNOSIS — N390 Urinary tract infection, site not specified: Secondary | ICD-10-CM | POA: Diagnosis not present

## 2014-11-23 DIAGNOSIS — E559 Vitamin D deficiency, unspecified: Secondary | ICD-10-CM | POA: Diagnosis not present

## 2014-11-23 DIAGNOSIS — I10 Essential (primary) hypertension: Secondary | ICD-10-CM | POA: Diagnosis not present

## 2014-11-26 DIAGNOSIS — I69351 Hemiplegia and hemiparesis following cerebral infarction affecting right dominant side: Secondary | ICD-10-CM | POA: Diagnosis not present

## 2014-11-26 DIAGNOSIS — Z8546 Personal history of malignant neoplasm of prostate: Secondary | ICD-10-CM | POA: Diagnosis not present

## 2014-11-26 DIAGNOSIS — R413 Other amnesia: Secondary | ICD-10-CM | POA: Diagnosis not present

## 2014-11-26 DIAGNOSIS — R1310 Dysphagia, unspecified: Secondary | ICD-10-CM | POA: Diagnosis not present

## 2014-11-26 DIAGNOSIS — I69391 Dysphagia following cerebral infarction: Secondary | ICD-10-CM | POA: Diagnosis not present

## 2014-11-26 DIAGNOSIS — I10 Essential (primary) hypertension: Secondary | ICD-10-CM | POA: Diagnosis not present

## 2014-11-27 DIAGNOSIS — R413 Other amnesia: Secondary | ICD-10-CM | POA: Diagnosis not present

## 2014-11-27 DIAGNOSIS — Z8546 Personal history of malignant neoplasm of prostate: Secondary | ICD-10-CM | POA: Diagnosis not present

## 2014-11-27 DIAGNOSIS — I10 Essential (primary) hypertension: Secondary | ICD-10-CM | POA: Diagnosis not present

## 2014-11-27 DIAGNOSIS — I69351 Hemiplegia and hemiparesis following cerebral infarction affecting right dominant side: Secondary | ICD-10-CM | POA: Diagnosis not present

## 2014-11-27 DIAGNOSIS — R1310 Dysphagia, unspecified: Secondary | ICD-10-CM | POA: Diagnosis not present

## 2014-11-27 DIAGNOSIS — I69391 Dysphagia following cerebral infarction: Secondary | ICD-10-CM | POA: Diagnosis not present

## 2014-11-29 DIAGNOSIS — I10 Essential (primary) hypertension: Secondary | ICD-10-CM | POA: Diagnosis not present

## 2014-11-29 DIAGNOSIS — I69391 Dysphagia following cerebral infarction: Secondary | ICD-10-CM | POA: Diagnosis not present

## 2014-11-29 DIAGNOSIS — R413 Other amnesia: Secondary | ICD-10-CM | POA: Diagnosis not present

## 2014-11-29 DIAGNOSIS — R1310 Dysphagia, unspecified: Secondary | ICD-10-CM | POA: Diagnosis not present

## 2014-11-29 DIAGNOSIS — I69351 Hemiplegia and hemiparesis following cerebral infarction affecting right dominant side: Secondary | ICD-10-CM | POA: Diagnosis not present

## 2014-11-29 DIAGNOSIS — Z8546 Personal history of malignant neoplasm of prostate: Secondary | ICD-10-CM | POA: Diagnosis not present

## 2014-11-30 DIAGNOSIS — I10 Essential (primary) hypertension: Secondary | ICD-10-CM | POA: Diagnosis not present

## 2014-11-30 DIAGNOSIS — I69391 Dysphagia following cerebral infarction: Secondary | ICD-10-CM | POA: Diagnosis not present

## 2014-11-30 DIAGNOSIS — I69351 Hemiplegia and hemiparesis following cerebral infarction affecting right dominant side: Secondary | ICD-10-CM | POA: Diagnosis not present

## 2014-11-30 DIAGNOSIS — R413 Other amnesia: Secondary | ICD-10-CM | POA: Diagnosis not present

## 2014-11-30 DIAGNOSIS — R1310 Dysphagia, unspecified: Secondary | ICD-10-CM | POA: Diagnosis not present

## 2014-11-30 DIAGNOSIS — Z8546 Personal history of malignant neoplasm of prostate: Secondary | ICD-10-CM | POA: Diagnosis not present

## 2014-12-04 DIAGNOSIS — I69351 Hemiplegia and hemiparesis following cerebral infarction affecting right dominant side: Secondary | ICD-10-CM | POA: Diagnosis not present

## 2014-12-04 DIAGNOSIS — R1310 Dysphagia, unspecified: Secondary | ICD-10-CM | POA: Diagnosis not present

## 2014-12-04 DIAGNOSIS — I10 Essential (primary) hypertension: Secondary | ICD-10-CM | POA: Diagnosis not present

## 2014-12-04 DIAGNOSIS — Z8546 Personal history of malignant neoplasm of prostate: Secondary | ICD-10-CM | POA: Diagnosis not present

## 2014-12-04 DIAGNOSIS — I69391 Dysphagia following cerebral infarction: Secondary | ICD-10-CM | POA: Diagnosis not present

## 2014-12-04 DIAGNOSIS — R413 Other amnesia: Secondary | ICD-10-CM | POA: Diagnosis not present

## 2014-12-05 DIAGNOSIS — I69351 Hemiplegia and hemiparesis following cerebral infarction affecting right dominant side: Secondary | ICD-10-CM | POA: Diagnosis not present

## 2014-12-05 DIAGNOSIS — I10 Essential (primary) hypertension: Secondary | ICD-10-CM | POA: Diagnosis not present

## 2014-12-05 DIAGNOSIS — R413 Other amnesia: Secondary | ICD-10-CM | POA: Diagnosis not present

## 2014-12-05 DIAGNOSIS — R1310 Dysphagia, unspecified: Secondary | ICD-10-CM | POA: Diagnosis not present

## 2014-12-05 DIAGNOSIS — Z8546 Personal history of malignant neoplasm of prostate: Secondary | ICD-10-CM | POA: Diagnosis not present

## 2014-12-05 DIAGNOSIS — I69391 Dysphagia following cerebral infarction: Secondary | ICD-10-CM | POA: Diagnosis not present

## 2014-12-06 DIAGNOSIS — I10 Essential (primary) hypertension: Secondary | ICD-10-CM | POA: Diagnosis not present

## 2014-12-06 DIAGNOSIS — I69391 Dysphagia following cerebral infarction: Secondary | ICD-10-CM | POA: Diagnosis not present

## 2014-12-06 DIAGNOSIS — R1310 Dysphagia, unspecified: Secondary | ICD-10-CM | POA: Diagnosis not present

## 2014-12-06 DIAGNOSIS — Z8546 Personal history of malignant neoplasm of prostate: Secondary | ICD-10-CM | POA: Diagnosis not present

## 2014-12-06 DIAGNOSIS — I69351 Hemiplegia and hemiparesis following cerebral infarction affecting right dominant side: Secondary | ICD-10-CM | POA: Diagnosis not present

## 2014-12-06 DIAGNOSIS — R413 Other amnesia: Secondary | ICD-10-CM | POA: Diagnosis not present

## 2014-12-07 DIAGNOSIS — R1310 Dysphagia, unspecified: Secondary | ICD-10-CM | POA: Diagnosis not present

## 2014-12-07 DIAGNOSIS — Z8546 Personal history of malignant neoplasm of prostate: Secondary | ICD-10-CM | POA: Diagnosis not present

## 2014-12-07 DIAGNOSIS — I10 Essential (primary) hypertension: Secondary | ICD-10-CM | POA: Diagnosis not present

## 2014-12-07 DIAGNOSIS — I69391 Dysphagia following cerebral infarction: Secondary | ICD-10-CM | POA: Diagnosis not present

## 2014-12-07 DIAGNOSIS — I69351 Hemiplegia and hemiparesis following cerebral infarction affecting right dominant side: Secondary | ICD-10-CM | POA: Diagnosis not present

## 2014-12-07 DIAGNOSIS — R413 Other amnesia: Secondary | ICD-10-CM | POA: Diagnosis not present

## 2014-12-10 DIAGNOSIS — I10 Essential (primary) hypertension: Secondary | ICD-10-CM | POA: Diagnosis not present

## 2014-12-10 DIAGNOSIS — I69391 Dysphagia following cerebral infarction: Secondary | ICD-10-CM | POA: Diagnosis not present

## 2014-12-10 DIAGNOSIS — I69351 Hemiplegia and hemiparesis following cerebral infarction affecting right dominant side: Secondary | ICD-10-CM | POA: Diagnosis not present

## 2014-12-10 DIAGNOSIS — R1310 Dysphagia, unspecified: Secondary | ICD-10-CM | POA: Diagnosis not present

## 2014-12-10 DIAGNOSIS — Z8546 Personal history of malignant neoplasm of prostate: Secondary | ICD-10-CM | POA: Diagnosis not present

## 2014-12-10 DIAGNOSIS — R413 Other amnesia: Secondary | ICD-10-CM | POA: Diagnosis not present

## 2014-12-11 DIAGNOSIS — I10 Essential (primary) hypertension: Secondary | ICD-10-CM | POA: Diagnosis not present

## 2014-12-11 DIAGNOSIS — I69351 Hemiplegia and hemiparesis following cerebral infarction affecting right dominant side: Secondary | ICD-10-CM | POA: Diagnosis not present

## 2014-12-11 DIAGNOSIS — I69391 Dysphagia following cerebral infarction: Secondary | ICD-10-CM | POA: Diagnosis not present

## 2014-12-11 DIAGNOSIS — Z8546 Personal history of malignant neoplasm of prostate: Secondary | ICD-10-CM | POA: Diagnosis not present

## 2014-12-11 DIAGNOSIS — R1310 Dysphagia, unspecified: Secondary | ICD-10-CM | POA: Diagnosis not present

## 2014-12-11 DIAGNOSIS — R413 Other amnesia: Secondary | ICD-10-CM | POA: Diagnosis not present

## 2014-12-12 ENCOUNTER — Emergency Department (HOSPITAL_COMMUNITY): Payer: Medicare Other

## 2014-12-12 ENCOUNTER — Encounter (HOSPITAL_COMMUNITY): Payer: Self-pay

## 2014-12-12 ENCOUNTER — Inpatient Hospital Stay (HOSPITAL_COMMUNITY)
Admission: EM | Admit: 2014-12-12 | Discharge: 2014-12-14 | DRG: 689 | Disposition: A | Discharge status: Home / Self Care | Payer: Medicare Other | Attending: Internal Medicine | Admitting: Internal Medicine

## 2014-12-12 DIAGNOSIS — G9340 Encephalopathy, unspecified: Secondary | ICD-10-CM | POA: Diagnosis present

## 2014-12-12 DIAGNOSIS — R410 Disorientation, unspecified: Secondary | ICD-10-CM | POA: Diagnosis not present

## 2014-12-12 DIAGNOSIS — F0390 Unspecified dementia without behavioral disturbance: Secondary | ICD-10-CM | POA: Diagnosis present

## 2014-12-12 DIAGNOSIS — N3090 Cystitis, unspecified without hematuria: Secondary | ICD-10-CM | POA: Diagnosis not present

## 2014-12-12 DIAGNOSIS — I69322 Dysarthria following cerebral infarction: Secondary | ICD-10-CM

## 2014-12-12 DIAGNOSIS — R4182 Altered mental status, unspecified: Secondary | ICD-10-CM | POA: Diagnosis not present

## 2014-12-12 DIAGNOSIS — R1310 Dysphagia, unspecified: Secondary | ICD-10-CM | POA: Diagnosis not present

## 2014-12-12 DIAGNOSIS — I69391 Dysphagia following cerebral infarction: Secondary | ICD-10-CM | POA: Diagnosis not present

## 2014-12-12 DIAGNOSIS — J449 Chronic obstructive pulmonary disease, unspecified: Secondary | ICD-10-CM | POA: Diagnosis not present

## 2014-12-12 DIAGNOSIS — N3000 Acute cystitis without hematuria: Secondary | ICD-10-CM

## 2014-12-12 DIAGNOSIS — Z87891 Personal history of nicotine dependence: Secondary | ICD-10-CM | POA: Diagnosis not present

## 2014-12-12 DIAGNOSIS — I10 Essential (primary) hypertension: Secondary | ICD-10-CM

## 2014-12-12 DIAGNOSIS — Z8673 Personal history of transient ischemic attack (TIA), and cerebral infarction without residual deficits: Secondary | ICD-10-CM | POA: Diagnosis not present

## 2014-12-12 DIAGNOSIS — R413 Other amnesia: Secondary | ICD-10-CM | POA: Diagnosis not present

## 2014-12-12 DIAGNOSIS — G8191 Hemiplegia, unspecified affecting right dominant side: Secondary | ICD-10-CM | POA: Diagnosis present

## 2014-12-12 DIAGNOSIS — I69351 Hemiplegia and hemiparesis following cerebral infarction affecting right dominant side: Secondary | ICD-10-CM | POA: Diagnosis not present

## 2014-12-12 DIAGNOSIS — N390 Urinary tract infection, site not specified: Principal | ICD-10-CM

## 2014-12-12 DIAGNOSIS — G8190 Hemiplegia, unspecified affecting unspecified side: Secondary | ICD-10-CM | POA: Diagnosis not present

## 2014-12-12 DIAGNOSIS — Z8546 Personal history of malignant neoplasm of prostate: Secondary | ICD-10-CM | POA: Diagnosis not present

## 2014-12-12 LAB — COMPREHENSIVE METABOLIC PANEL
ALT: 20 U/L (ref 17–63)
AST: 18 U/L (ref 15–41)
Albumin: 3.9 g/dL (ref 3.5–5.0)
Alkaline Phosphatase: 65 U/L (ref 38–126)
Anion gap: 10 (ref 5–15)
BUN: 21 mg/dL — ABNORMAL HIGH (ref 6–20)
CALCIUM: 9.6 mg/dL (ref 8.9–10.3)
CO2: 25 mmol/L (ref 22–32)
Chloride: 103 mmol/L (ref 101–111)
Creatinine, Ser: 0.8 mg/dL (ref 0.61–1.24)
GFR calc Af Amer: >60 mL/min (ref >60)
GFR calc non Af Amer: >60 mL/min (ref >60)
Glucose, Bld: 101 mg/dL — ABNORMAL HIGH (ref 65–99)
Potassium: 4.1 mmol/L (ref 3.5–5.1)
SODIUM: 138 mmol/L (ref 135–145)
TOTAL PROTEIN: 6.9 g/dL (ref 6.5–8.1)
Total Bilirubin: 0.5 mg/dL (ref 0.3–1.2)

## 2014-12-12 LAB — CBC WITH DIFFERENTIAL/PLATELET
Basophils Absolute: 0 10*3/uL (ref 0.0–0.1)
Basophils Relative: 1 % (ref 0–1)
EOS ABS: 0.1 10*3/uL (ref 0.0–0.7)
Eosinophils Relative: 2 % (ref 0–5)
HCT: 39.8 % (ref 39.0–52.0)
HEMOGLOBIN: 12.7 g/dL — AB (ref 13.0–17.0)
Lymphocytes Relative: 22 % (ref 12–46)
Lymphs Abs: 1.7 10*3/uL (ref 0.7–4.0)
MCH: 27.8 pg (ref 26.0–34.0)
MCHC: 31.9 g/dL (ref 30.0–36.0)
MCV: 87.1 fL (ref 78.0–100.0)
MONOS PCT: 11 % (ref 3–12)
Monocytes Absolute: 0.8 10*3/uL (ref 0.1–1.0)
NEUTROS ABS: 4.9 10*3/uL (ref 1.7–7.7)
Neutrophils Relative %: 64 % (ref 43–77)
Platelets: 353 10*3/uL (ref 150–400)
RBC: 4.57 MIL/uL (ref 4.22–5.81)
RDW: 14.6 % (ref 11.5–15.5)
WBC: 7.6 10*3/uL (ref 4.0–10.5)

## 2014-12-12 LAB — URINALYSIS, ROUTINE W REFLEX MICROSCOPIC
Bilirubin Urine: NEGATIVE
GLUCOSE, UA: NEGATIVE mg/dL
HGB URINE DIPSTICK: NEGATIVE
Ketones, ur: NEGATIVE mg/dL
Nitrite: NEGATIVE
Protein, ur: NEGATIVE mg/dL
Specific Gravity, Urine: 1.021 (ref 1.005–1.030)
Urobilinogen, UA: 0.2 mg/dL (ref 0.0–1.0)
pH: 6 (ref 5.0–8.0)

## 2014-12-12 LAB — CBG MONITORING, ED: GLUCOSE-CAPILLARY: 91 mg/dL (ref 65–99)

## 2014-12-12 LAB — URINE MICROSCOPIC-ADD ON

## 2014-12-12 MED ORDER — ONDANSETRON HCL 4 MG PO TABS: 4.0000 mg | ORAL_TABLET | Freq: Four times a day (QID) | ORAL | Status: DC | PRN

## 2014-12-12 MED ORDER — TAMSULOSIN HCL 0.4 MG PO CAPS
0.4000 mg | ORAL_CAPSULE | Freq: Every day | ORAL | Status: DC
  Administered 2014-12-12 – 2014-12-13 (×2): 0.4 mg via ORAL
  Filled 2014-12-12 (×3): qty 1

## 2014-12-12 MED ORDER — RESOURCE THICKENUP CLEAR PO POWD
ORAL | Status: DC | PRN
  Filled 2014-12-12: qty 125

## 2014-12-12 MED ORDER — ENOXAPARIN SODIUM 40 MG/0.4ML ~~LOC~~ SOLN
40.0000 mg | SUBCUTANEOUS | Status: DC
  Administered 2014-12-12 – 2014-12-13 (×2): 40 mg via SUBCUTANEOUS
  Filled 2014-12-12 (×3): qty 0.4

## 2014-12-12 MED ORDER — ONDANSETRON HCL 4 MG/2ML IJ SOLN
4.0000 mg | Freq: Four times a day (QID) | INTRAMUSCULAR | Status: DC | PRN
  Administered 2014-12-14: 4 mg via INTRAVENOUS
  Filled 2014-12-12: qty 2

## 2014-12-12 MED ORDER — CLOPIDOGREL BISULFATE 75 MG PO TABS
75.0000 mg | ORAL_TABLET | Freq: Every day | ORAL | Status: DC
  Administered 2014-12-12 – 2014-12-14 (×3): 75 mg via ORAL
  Filled 2014-12-12 (×5): qty 1

## 2014-12-12 MED ORDER — SODIUM CHLORIDE 0.9 % IV SOLN
INTRAVENOUS | Status: DC
  Administered 2014-12-12: 18:00:00 via INTRAVENOUS

## 2014-12-12 MED ORDER — ACETAMINOPHEN 500 MG PO TABS
500.0000 mg | ORAL_TABLET | Freq: Four times a day (QID) | ORAL | Status: DC | PRN
  Administered 2014-12-14 (×2): 500 mg via ORAL
  Filled 2014-12-12 (×2): qty 1

## 2014-12-12 MED ORDER — ESCITALOPRAM OXALATE 20 MG PO TABS
20.0000 mg | ORAL_TABLET | Freq: Every day | ORAL | Status: DC
  Administered 2014-12-12 – 2014-12-14 (×3): 20 mg via ORAL
  Filled 2014-12-12 (×5): qty 1

## 2014-12-12 MED ORDER — SODIUM CHLORIDE 0.9 % IV BOLUS (SEPSIS): 1000.0000 mL | Freq: Once | INTRAVENOUS | Status: DC

## 2014-12-12 MED ORDER — ATORVASTATIN CALCIUM 20 MG PO TABS
20.0000 mg | ORAL_TABLET | Freq: Every day | ORAL | Status: DC
  Administered 2014-12-12 – 2014-12-13 (×2): 20 mg via ORAL
  Filled 2014-12-12 (×3): qty 1

## 2014-12-12 MED ORDER — ALBUTEROL SULFATE (2.5 MG/3ML) 0.083% IN NEBU: 2.5000 mg | INHALATION_SOLUTION | RESPIRATORY_TRACT | Status: DC | PRN

## 2014-12-12 MED ORDER — DEXTROSE 5 % IV SOLN
1.0000 g | Freq: Once | INTRAVENOUS | Status: AC
  Administered 2014-12-12: 1 g via INTRAVENOUS
  Filled 2014-12-12: qty 10

## 2014-12-12 MED ORDER — CEFTRIAXONE SODIUM IN DEXTROSE 20 MG/ML IV SOLN
1.0000 g | INTRAVENOUS | Status: DC
  Administered 2014-12-13: 1 g via INTRAVENOUS
  Filled 2014-12-12: qty 50

## 2014-12-12 NOTE — ED Provider Notes (Signed)
CSN: [id number]     Arrival date & time 12/12/14  1152 History   First MD Initiated Contact with Patient 12/12/14 1204     Chief Complaint  Patient presents with  . Altered Mental Status     (Consider location/radiation/quality/duration/timing/severity/associated sxs/prior Treatment) Patient is a 81 y.o. male presenting with altered mental status.  Altered Mental Status Presenting symptoms: combativeness, confusion and lethargy   Severity:  Moderate Episode history:  Continuous Duration:  3 days Timing:  Constant Progression:  Unchanged Chronicity:  New Context comment:  Recent CVA with persistent R sided deficits, prior UTI Associated symptoms: slurred speech   Associated symptoms: no abdominal pain, no fever, no nausea and no vomiting     Past Medical History  Diagnosis Date  . Hypertension   . Cancer   . Stroke    Past Surgical History  Procedure Laterality Date  . Appendectomy    . Kidney surgery     Family History  Problem Relation Age of Onset  . CAD Father    History  Substance Use Topics  . Smoking status: Former Research scientist (life sciences)  . Smokeless tobacco: Not on file  . Alcohol Use: 0.0 oz/week    0 Standard drinks or equivalent per week     Comment: Occasionally    Review of Systems  Constitutional: Negative for fever.  Gastrointestinal: Negative for nausea, vomiting and abdominal pain.  Psychiatric/Behavioral: Positive for confusion.  All other systems reviewed and are negative.     Allergies  Review of patient's allergies indicates no known allergies.  Home Medications   Prior to Admission medications   Medication Sig Start Date End Date Taking? Authorizing Provider  acetaminophen (TYLENOL) 500 MG tablet Take 500 mg by mouth every 6 (six) hours as needed for mild pain, moderate pain, fever or headache.   Yes Historical Provider, MD  atorvastatin (LIPITOR) 20 MG tablet Take 1 tablet (20 mg total) by mouth daily at 6 PM. 07/30/14  Yes Ivan Anchors Love, PA-C   clopidogrel (PLAVIX) 75 MG tablet Take 1 tablet (75 mg total) by mouth daily. Patient taking differently: Take 75 mg by mouth daily with breakfast.  10/03/14  Yes Britt Bottom, MD  escitalopram (LEXAPRO) 20 MG tablet Take 20 mg by mouth daily with breakfast.    Yes Historical Provider, MD  nitrofurantoin (MACRODANTIN) 50 MG capsule Take 50 mg by mouth at bedtime.   Yes Historical Provider, MD  tamsulosin (FLOMAX) 0.4 MG CAPS capsule Take 0.4 mg by mouth at bedtime.    Yes Historical Provider, MD  acetaminophen (TYLENOL) 325 MG tablet Take 2 tablets (650 mg total) by mouth every 6 (six) hours as needed for mild pain. Patient not taking: Reported on 12/12/2014 07/30/14   Ivan Anchors Love, PA-C  baclofen (LIORESAL) 5 mg TABS tablet Take 0.5 tablets (5 mg total) by mouth 3 (three) times daily as needed (hiccups). Patient not taking: Reported on 12/12/2014 07/30/14   Ivan Anchors Love, PA-C  feeding supplement, ENSURE, (ENSURE) PUDG Take 1 Container by mouth 3 (three) times daily with meals. Patient not taking: Reported on 12/12/2014 07/30/14   Ivan Anchors Love, PA-C  ipratropium-albuterol (DUONEB) 0.5-2.5 (3) MG/3ML SOLN Take 3 mLs by nebulization every 4 (four) hours as needed. Patient not taking: Reported on 12/12/2014 07/30/14   Ivan Anchors Love, PA-C  polycarbophil (FIBERCON) 625 MG tablet Take 1 tablet (625 mg total) by mouth 2 (two) times daily. Patient not taking: Reported on 12/12/2014 07/30/14   Bary Leriche,  PA-C  saccharomyces boulardii (FLORASTOR) 250 MG capsule Take 1 capsule (250 mg total) by mouth 2 (two) times daily. Patient not taking: Reported on 12/12/2014 07/30/14   Ivan Anchors Love, PA-C  senna-docusate (SENOKOT-S) 8.6-50 MG per tablet Take 1 tablet by mouth at bedtime as needed for mild constipation. Patient not taking: Reported on 12/12/2014 07/30/14   Ivan Anchors Love, PA-C  white petrolatum (VASELINE) GEL Apply 1 application topically 2 (two) times daily. Patient not taking: Reported on 12/12/2014 07/30/14    Ivan Anchors Love, PA-C   BP 126/63 mmHg  Pulse 65  Temp(Src) 97.4 F (36.3 C) (Oral)  Resp 12  SpO2 100% Physical Exam  Constitutional: He is oriented to person, place, and time. He appears well-developed and well-nourished.  HENT:  Head: Normocephalic and atraumatic.  Eyes: Conjunctivae and EOM are normal.  Neck: Normal range of motion. Neck supple.  Cardiovascular: Normal rate, regular rhythm and normal heart sounds.   Pulmonary/Chest: Effort normal and breath sounds normal. No respiratory distress.  Abdominal: He exhibits no distension. There is no tenderness. There is no rebound and no guarding.  Musculoskeletal: Normal range of motion.  Neurological: He is alert and oriented to person, place, and time. No cranial nerve deficit.  Spastic R side U and LE  Skin: Skin is warm and dry.  Vitals reviewed.   ED Course  Procedures (including critical care time) Labs Review Labs Reviewed  CBC WITH DIFFERENTIAL/PLATELET - Abnormal; Notable for the following:    Hemoglobin 12.7 (*)    All other components within normal limits  COMPREHENSIVE METABOLIC PANEL - Abnormal; Notable for the following:    Glucose, Bld 101 (*)    BUN 21 (*)    All other components within normal limits  URINALYSIS, ROUTINE W REFLEX MICROSCOPIC - Abnormal; Notable for the following:    APPearance CLOUDY (*)    Leukocytes, UA LARGE (*)    All other components within normal limits  CULTURE, BLOOD (ROUTINE X 2)  CULTURE, BLOOD (ROUTINE X 2)  URINE CULTURE  URINE MICROSCOPIC-ADD ON  CBG MONITORING, ED    Imaging Review Dg Chest 2 View  12/12/2014   CLINICAL DATA:  Altered mental status, history stroke, hypertension, former smoker  EXAM: CHEST  2 VIEW  COMPARISON:  10/08/2014  FINDINGS: Significantly rotated to the RIGHT.  Skin folds project over LEFT lung.  Normal heart size, mediastinal contours and pulmonary vascularity.  Emphysematous changes with biapical scarring.  No definite infiltrate, pleural effusion  or pneumothorax.  Bones demineralized.  IMPRESSION: COPD changes with biapical scarring.   Electronically Signed   By: Lavonia Dana M.D.   On: 12/12/2014 14:18   Ct Head Wo Contrast  12/12/2014   CLINICAL DATA:  Altered mental status, confusion, hypotension.  EXAM: CT HEAD WITHOUT CONTRAST  TECHNIQUE: Contiguous axial images were obtained from the base of the skull through the vertex without intravenous contrast.  COMPARISON:  MR brain 10/10/2014 and CT head 10/08/2014.  FINDINGS: No evidence of an acute infarct, acute hemorrhage, mass lesion, mass effect or hydrocephalus. Atrophy. Extensive periventricular low attenuation. Scattered remote infarcts in the basal ganglia, thalamus and brainstem. Visualized portions of the paranasal sinuses and mastoid air cells are clear.  IMPRESSION: 1. No acute intracranial abnormality. 2. Atrophy, chronic microvascular white matter ischemic changes and remote infarcts.   Electronically Signed   By: Lorin Picket M.D.   On: 12/12/2014 14:27     EKG Interpretation None      MDM  Final diagnoses:  Encephalopathy  Acute cystitis without hematuria    81 y.o. male with pertinent PMH of prior CVA with r sided deficit, prior UTI presents with confusion, lethargy, and combativeness.  On arrival pt has vitals and physical exam as above.  On initial exam, pt oriented x 3, lucid, without new focal neuro deficit.  Wu with UTI.  Discussed admission vs home therapy and with shared decision making agreed on home therapy.  Admitted in stable condition after rocephin.    I have reviewed all laboratory and imaging studies if ordered as above  1. Encephalopathy   2. Acute cystitis without hematuria         Debby Freiberg, MD 12/12/14 1549

## 2014-12-12 NOTE — ED Notes (Signed)
Patient confused and resistant. Unable to obtain 2nd set of blood cultures at this time.

## 2014-12-12 NOTE — ED Notes (Signed)
Pt presents with c/o family with c/o altered mental status. Family reports his blood pressure has been low at home and he has been acting differently. Family report that last time this happened, he had a UTI. Pt had a stroke in December, right sided deficits present from that stroke.

## 2014-12-12 NOTE — ED Notes (Signed)
cbg 91

## 2014-12-12 NOTE — ED Notes (Signed)
Wife at bedside.

## 2014-12-12 NOTE — H&P (Addendum)
History and Physical  [name] JSH:[id number] DOB: February 25, 1926 DOA: 12/12/2014  Referring physician: Dr. Debby Freiberg, EDP PCP: Simona Huh, MD  Outpatient Specialists:  1. Neurology: Dr. Roosvelt Harps 2. Urology: Dr. Stephan Minister.  Chief Complaint: Altered mental status and belligerence.  HPI: [name] is a 81 y.o. male with history of CVA with residual right hemiparesis, HTN, prostate cancer, pneumonia, UTIs-on nitrofurantoin prophylaxis, lives at home with spouse, ambulates with the help of a hemiwalker, presented to the [facility] ED on 12/12/14 with confusion and belligerence. Patient unable to provide any history secondary to altered mental status. As per spouse, patient has been more sleepy over the last week prior to admission. She discussed this with his PCP and was advised that his medications were not the cause for that. Over the last 48 hours, patient has become progressively confused and intermittently belligerent to her and home health staff. No reported fever, chills, dysuria, urinary frequency, foul smelling urine or pain. No cough, dyspnea, chest pain, nausea, vomiting or diarrhea. No new stroke symptoms reported. Spouse indicates that his presentation is similar to previous episodes during UTI. Patient also on regular diet and nectar thick liquids from stroke related dysphagia. In the ED, afebrile, transient soft blood pressures, UA shows significant leukocytosis but no comment regarding bacteria, chest x-ray without acute changes and CT head with no acute intracranial abnormality. Hospitalist admission requested.   Review of Systems: All systems reviewed and apart from history of presenting illness, are negative.  Past Medical History  Diagnosis Date  . Hypertension   . Cancer   . Stroke    Past Surgical History  Procedure Laterality Date  . Appendectomy    . Kidney surgery     Social History:  reports that he has quit smoking. He  does not have any smokeless tobacco history on file. He reports that he drinks alcohol. He reports that he does not use illicit drugs. Married. Lives with spouse. Ambulates with the help of a hemiwalker. Has not had any alcohol to drink since December 2015. Quit smoking in his 33s.  No Known Allergies  Family History  Problem Relation Age of Onset  . CAD Father    sisters have osteoporosis.  Prior to Admission medications   Medication Sig Start Date End Date Taking? Authorizing Provider  acetaminophen (TYLENOL) 500 MG tablet Take 500 mg by mouth every 6 (six) hours as needed for mild pain, moderate pain, fever or headache.   Yes Historical Provider, MD  atorvastatin (LIPITOR) 20 MG tablet Take 1 tablet (20 mg total) by mouth daily at 6 PM. 07/30/14  Yes Ivan Anchors Love, PA-C  clopidogrel (PLAVIX) 75 MG tablet Take 1 tablet (75 mg total) by mouth daily. Patient taking differently: Take 75 mg by mouth daily with breakfast.  10/03/14  Yes Britt Bottom, MD  escitalopram (LEXAPRO) 20 MG tablet Take 20 mg by mouth daily with breakfast.    Yes Historical Provider, MD  nitrofurantoin (MACRODANTIN) 50 MG capsule Take 50 mg by mouth at bedtime.   Yes Historical Provider, MD  tamsulosin (FLOMAX) 0.4 MG CAPS capsule Take 0.4 mg by mouth at bedtime.    Yes Historical Provider, MD   Physical Exam: Filed Vitals:   12/12/14 1202 12/12/14 1427  BP: 113/68 126/63  Pulse: 70 65  Temp: 97.4 F (36.3 C)   TempSrc: Oral   Resp: 20 12  SpO2: 100% 100%     General exam: Moderately built  and nourished pleasant elderly male patient, lying comfortably supine on the gurney in no obvious distress. Patient not fully cooperative with complete exam.  Head, eyes and ENT: Nontraumatic and normocephalic. Pupils equally reacting to light and accommodation. Oral mucosa slightly dry.  Neck: Supple. No JVD, carotid bruit or thyromegaly.  Lymphatics: No lymphadenopathy.  Respiratory system: Poor inspiratory effort but  seems clear to auscultation. No increased work of breathing.  Cardiovascular system: S1 and S2 heard, RRR. No JVD, murmurs, gallops, clicks or pedal edema.  Gastrointestinal system: Abdomen is nondistended, soft and nontender. Normal bowel sounds heard. No organomegaly or masses appreciated.  Central nervous system: Alert and oriented only to self. Facial asymmetry and dysarthria-old and unchanged per spouse  Extremities: 5 x 5 power in left limbs. Contracture of right upper extremity and patient not cooperating with power assessment of right limbs. Peripheral pulses symmetrically felt.   Skin: No rashes or acute findings.  Musculoskeletal system: Negative exam.  Psychiatry: Pleasant and not very cooperative at this time. However not agitated.   Labs on Admission:  Basic Metabolic Panel:  Recent Labs Lab 12/12/14 1322  NA 138  K 4.1  CL 103  CO2 25  GLUCOSE 101*  BUN 21*  CREATININE 0.80  CALCIUM 9.6   Liver Function Tests:  Recent Labs Lab 12/12/14 1322  AST 18  ALT 20  ALKPHOS 65  BILITOT 0.5  PROT 6.9  ALBUMIN 3.9   No results for input(s): LIPASE, AMYLASE in the last 168 hours. No results for input(s): AMMONIA in the last 168 hours. CBC:  Recent Labs Lab 12/12/14 1322  WBC 7.6  NEUTROABS 4.9  HGB 12.7*  HCT 39.8  MCV 87.1  PLT 353   Cardiac Enzymes: No results for input(s): CKTOTAL, CKMB, CKMBINDEX, TROPONINI in the last 168 hours.  BNP (last 3 results) No results for input(s): PROBNP in the last 8760 hours. CBG:  Recent Labs Lab 12/12/14 1318  GLUCAP 91    Radiological Exams on Admission: Dg Chest 2 View  12/12/2014   CLINICAL DATA:  Altered mental status, history stroke, hypertension, former smoker  EXAM: CHEST  2 VIEW  COMPARISON:  10/08/2014  FINDINGS: Significantly rotated to the RIGHT.  Skin folds project over LEFT lung.  Normal heart size, mediastinal contours and pulmonary vascularity.  Emphysematous changes with biapical scarring.   No definite infiltrate, pleural effusion or pneumothorax.  Bones demineralized.  IMPRESSION: COPD changes with biapical scarring.   Electronically Signed   By: Lavonia Dana M.D.   On: 12/12/2014 14:18   Ct Head Wo Contrast  12/12/2014   CLINICAL DATA:  Altered mental status, confusion, hypotension.  EXAM: CT HEAD WITHOUT CONTRAST  TECHNIQUE: Contiguous axial images were obtained from the base of the skull through the vertex without intravenous contrast.  COMPARISON:  MR brain 10/10/2014 and CT head 10/08/2014.  FINDINGS: No evidence of an acute infarct, acute hemorrhage, mass lesion, mass effect or hydrocephalus. Atrophy. Extensive periventricular low attenuation. Scattered remote infarcts in the basal ganglia, thalamus and brainstem. Visualized portions of the paranasal sinuses and mastoid air cells are clear.  IMPRESSION: 1. No acute intracranial abnormality. 2. Atrophy, chronic microvascular white matter ischemic changes and remote infarcts.   Electronically Signed   By: Lorin Picket M.D.   On: 12/12/2014 14:27    EKG: No EKG in Epic for today. Not pertinent for today's presentation.  Assessment/Plan Principal Problem:   Possible UTI (lower urinary tract infection) - Even though patient has no symptoms (  history not reliable since patient confused), afebrile and no leukocytosis, urine microscopy shows significant leukocytosis and patient has had similar presentation during previous episodes of UTI. - Continue IV Rocephin after urine cultures sent of from ED.  Active Problems:   Acute encephalopathy - May be related to possible UTI and monitor with treatment of same - No new focal deficits and CT head negative for acute findings - Minimize medications that can contribution due to altered mental status - If mental status does not improve with treatment for UTI, then will need further evaluation.    Essential hypertension - Brief soft blood pressures in the ED which have since normalized     CVA with residual Right hemiparesis - CT head without acute findings - Continue Plavix and statins    Dysphagia, post-stroke - As per spouse, patient on regular consistency diet and nectar thickened liquids-continue same and monitor     Code Status: Full  Family Communication: Discussed extensively with spouse at bedside  Disposition Plan: DC home when medically stable in the next 48 hours   Time spent: 34 minutes  Tirzah Fross, MD, FACP, FHM. [facility] Pager [phone]  If 7PM-7AM, please contact night-coverage [email or web address] Password [facility] 12/12/2014, 4:13 PM

## 2014-12-13 DIAGNOSIS — G8190 Hemiplegia, unspecified affecting unspecified side: Secondary | ICD-10-CM | POA: Diagnosis not present

## 2014-12-13 DIAGNOSIS — G9340 Encephalopathy, unspecified: Secondary | ICD-10-CM | POA: Diagnosis not present

## 2014-12-13 DIAGNOSIS — G8191 Hemiplegia, unspecified affecting right dominant side: Secondary | ICD-10-CM | POA: Diagnosis present

## 2014-12-13 DIAGNOSIS — N390 Urinary tract infection, site not specified: Secondary | ICD-10-CM | POA: Diagnosis not present

## 2014-12-13 DIAGNOSIS — Z87891 Personal history of nicotine dependence: Secondary | ICD-10-CM | POA: Diagnosis not present

## 2014-12-13 DIAGNOSIS — I10 Essential (primary) hypertension: Secondary | ICD-10-CM | POA: Diagnosis not present

## 2014-12-13 DIAGNOSIS — Z8673 Personal history of transient ischemic attack (TIA), and cerebral infarction without residual deficits: Secondary | ICD-10-CM | POA: Diagnosis not present

## 2014-12-13 DIAGNOSIS — F0390 Unspecified dementia without behavioral disturbance: Secondary | ICD-10-CM | POA: Diagnosis present

## 2014-12-13 DIAGNOSIS — R4182 Altered mental status, unspecified: Secondary | ICD-10-CM | POA: Diagnosis not present

## 2014-12-13 LAB — URINE CULTURE
Colony Count: NO GROWTH
Culture: NO GROWTH

## 2014-12-13 NOTE — Evaluation (Addendum)
Physical Therapy Evaluation Patient Details Name: [name] MRN: [id number] DOB: [birth year] Today's Date: 12/13/2014   History of Present Illness  81 y.o. male with history of CVA with residual right hemiparesis, HTN, prostate cancer, pneumonia, UTIs-on nitrofurantoin prophylaxis, lives at home with spouse, ambulates with the help of a hemiwalker, presented to the [facility] ED on 12/12/14 with confusion and belligerence. CT of head negative for acute changes. Dx of likely UTI.   Clinical Impression  Pt admitted with above diagnosis. Pt currently with functional limitations due to the deficits listed below (see PT Problem List). Max assist for bed to recliner transfer. Per wife, pt can return home where he has 24* care givers.   Pt will benefit from skilled PT to increase their independence and safety with mobility to allow discharge to the venue listed below.       Follow Up Recommendations Home health PT    Equipment Recommendations  None recommended by PT    Recommendations for Other Services OT consult     Precautions / Restrictions Precautions Precautions: Fall Precaution Comments: R hemiparesis Restrictions Weight Bearing Restrictions: No      Mobility  Bed Mobility Overal bed mobility: Needs Assistance Bed Mobility: Supine to Sit;Rolling Rolling: Max assist   Supine to sit: Max assist     General bed mobility comments: assist to initiate movement to roll L, max assist to raise trunk and advance BLEs  Transfers Overall transfer level: Needs assistance Equipment used: None Transfers: Stand Pivot Transfers   Stand pivot transfers: +2 physical assistance;+2 safety/equipment;Max assist for balance, used R AFO       General transfer comment: pivoted on LLE, pt didn't weightbear thru RLE  Ambulation/Gait                Stairs            Wheelchair Mobility    Modified Rankin (Stroke Patients Only)       Balance Overall balance  assessment: Needs assistance   Sitting balance-Leahy Scale: Fair       Standing balance-Leahy Scale: Zero                               Pertinent Vitals/Pain Pain Assessment: No/denies pain    Home Living Family/patient expects to be discharged to:: Private residence Living Arrangements: Spouse/significant other Available Help at Discharge: Family;Personal care attendant;Available 24 hours/day Type of Home: House Home Access: Ramped entrance     Home Layout: Two level;Able to live on main level with bedroom/bathroom Home Equipment: Wheelchair - manual;Shower seat, hemiwalker, R AFO      Prior Function Level of Independence: Needs assistance   Gait / Transfers Assistance Needed: assist with hemiwalker  ADL's / Homemaking Assistance Needed: wife assists  Comments: retired Harold: Left    Extremity/Trunk Assessment   Upper Extremity Assessment: RUE deficits/detail RUE Deficits / Details: flaccid         Lower Extremity Assessment: RLE deficits/detail RLE Deficits / Details: hip flexion 3/5, knee extension 1/5, knee contracted in flexion (-50* extension PROM), ankle 0/5, sensation intact to light touch    Cervical / Trunk Assessment: Kyphotic  Communication   Communication: No difficulties  Cognition Arousal/Alertness: Awake/alert   Overall Cognitive Status: Impaired/Different from baseline (admitted with confusion which is improving, pt correctly stated year and birthdate, did not know his location)  Area of Impairment: Awareness                    General Comments      Exercises        Assessment/Plan    PT Assessment Patient needs continued PT services  PT Diagnosis Generalized weakness;Hemiplegia non-dominant side;Difficulty walking   PT Problem List Decreased strength;Decreased range of motion;Decreased activity tolerance;Decreased balance;Decreased cognition;Decreased  mobility  PT Treatment Interventions Gait training;Functional mobility training;Therapeutic activities;Patient/family education;Therapeutic exercise   PT Goals (Current goals can be found in the Care Plan section) Acute Rehab PT Goals Patient Stated Goal: return home PT Goal Formulation: With patient Time For Goal Achievement: 12/27/14 Potential to Achieve Goals: Fair    Frequency Min 3X/week   Barriers to discharge        Co-evaluation               End of Session Equipment Utilized During Treatment: Gait belt Activity Tolerance: Patient tolerated treatment well Patient left: in chair;with call bell/phone within reach;with chair alarm set Nurse Communication: Mobility status    Functional Assessment Tool Used: clinical judgement Functional Limitation: Mobility: Walking and moving around Mobility: Walking and Moving Around Current Status [phone]): At least 60 percent but less than 80 percent impaired, limited or restricted Mobility: Walking and Moving Around Goal Status [phone]): At least 40 percent but less than 60 percent impaired, limited or restricted    Time: 0922-0954 PT Time Calculation (min) (ACUTE ONLY): 32 min   Charges:   PT Evaluation $Initial PT Evaluation Tier I: 1 Procedure PT Treatments $Therapeutic Activity: 8-22 mins   PT G Codes:   PT G-Codes **NOT FOR INPATIENT CLASS** Functional Assessment Tool Used: clinical judgement Functional Limitation: Mobility: Walking and moving around Mobility: Walking and Moving Around Current Status (C1660): At least 60 percent but less than 80 percent impaired, limited or restricted Mobility: Walking and Moving Around Goal Status [phone]): At least 40 percent but less than 60 percent impaired, limited or restricted    Philomena Doheny 12/13/2014, 10:04 AM [phone]

## 2014-12-13 NOTE — Progress Notes (Signed)
Patient's wife does not want OT to be consulted as she feels that this would cost additional charges during his admission. She states that he has PT, OT and Speech therapy already ordered for home and does not need these additional evaluations while here in the hospital.

## 2014-12-13 NOTE — Care Management Note (Addendum)
Case Management Note  Patient Details  Name: JACQUE GARRELS MRN: [id number] Date of Birth: 1925/12/20  Subjective/Objective:       81 yo male admitted with UTI from home             Action/Plan: Received HH orders and communicated to Crystal with [facility], faxed orders, and discharge summary. Spouse aware that orders initiated for home.   Spoke with patient's spouse and she stated that the patient lives at home with her and they have personal caregivers 24/7 with [employer]. She stated that the patient was receiving [place] services PT, OT, ST with [facility] prior to this admit. Contacted [facility] and confirmed that patient was receiving services. Spouse stated that they do not need any other DME.  Await [place] orders prior to discharge. Expected Discharge Date:   (UNKNOWN)               Expected Discharge Plan:  [place]  In-House Referral:     Discharge planning Services  CM Consult  Post Acute Care Choice:    Choice offered to:  Spouse  DME Arranged:    DME Agency:     HH Arranged:    HH Agency:  [facility]  Status of Service:  In process, will continue to follow  Medicare Important Message Given:  No Date Medicare IM Given:    Medicare IM give by:    Date Additional Medicare IM Given:    Additional Medicare Important Message give by:     If discussed at [place] of Stay Meetings, dates discussed:    Additional Comments:  Scot Dock, RN 12/13/2014, 3:54 PM

## 2014-12-13 NOTE — Progress Notes (Signed)
PROGRESS NOTE    [name] FVC:[id number] DOB: 1925-11-09 DOA: 12/12/2014 PCP: Simona Huh, MD  HPI/Brief narrative 81 y.o. male with history of CVA with residual right hemiparesis, HTN, prostate cancer, pneumonia, UTIs-on nitrofurantoin prophylaxis, lives at home with spouse, ambulates with the help of a hemiwalker, presented to the [facility] ED on 12/12/14 with confusion and belligerence. Admitted for acute encephalopathy possibly d/t UTI. CT Head: no acute findings.  Assessment/Plan:  Principal Problem:  Possible UTI (lower urinary tract infection) - Even though patient has no symptoms (history not reliable since patient was confused), afebrile and no leukocytosis, urine microscopy shows significant leukocytosis and patient has had similar presentation during previous episodes of UTI. - Continue IV Rocephin after urine cultures sent of from ED. - Urine culture pending  Active Problems:  Acute encephalopathy - May be related to possible UTI and monitor with treatment of same - No new focal deficits and CT head negative for acute findings - Minimize medications that can contribution due to altered mental status - Mental status changes improved/resolved   Essential hypertension - Brief soft blood pressures in the ED which have since normalized   CVA with residual Right hemiparesis - CT head without acute findings - Continue Plavix and statins   Dysphagia, post-stroke - As per spouse, patient on regular consistency diet and nectar thickened liquids-continue same and monitor - Tolerating same    Code Status: Full Family Communication: None at bedside but discussed with spouse through Social research officer, government. Disposition Plan: DC home pending urine culture results   Consultants:  None  Procedures:  None  Antibiotics:  IV Rocephin 5/18 >   Subjective: Patient seen this morning. Denied complaints. As per nursing, some agitation overnight but  none this morning. Denies dyspnea, dysuria or urinary frequency.  Objective: Filed Vitals:   12/12/14 1743 12/12/14 2100 12/13/14 0526 12/13/14 1449  BP: 131/71 114/43 127/61 125/72  Pulse: 70 89 77 88  Temp: 98.1 F (36.7 C) 97.5 F (36.4 C) 98.4 F (36.9 C) 98.1 F (36.7 C)  TempSrc: Oral Oral Oral Oral  Resp: 18 20 18 20   Weight:   56.1 kg (123 lb 10.9 oz)   SpO2: 98% 98% 97% 98%    Intake/Output Summary (Last 24 hours) at 12/13/14 1625 Last data filed at 12/13/14 0950  Gross per 24 hour  Intake 1060.83 ml  Output    250 ml  Net 810.83 ml   Filed Weights   12/13/14 0526  Weight: 56.1 kg (123 lb 10.9 oz)     Exam:  General exam: Pleasant elderly male sitting up in bed eating breakfast with his left hand this morning. Respiratory system: Clear. No increased work of breathing. Cardiovascular system: S1 & S2 heard, RRR. No JVD, murmurs, gallops, clicks or pedal edema. Gastrointestinal system: Abdomen is nondistended, soft and nontender. Normal bowel sounds heard. Central nervous system: Alert and oriented 2. No new focal neurological deficits. Has old facial asymmetry and? Dysarthria Extremities: Left limbs grade 5 x 5 power. Right limbs with old hemiparesis.   Data Reviewed: Basic Metabolic Panel:  Recent Labs Lab 12/12/14 1322  NA 138  K 4.1  CL 103  CO2 25  GLUCOSE 101*  BUN 21*  CREATININE 0.80  CALCIUM 9.6   Liver Function Tests:  Recent Labs Lab 12/12/14 1322  AST 18  ALT 20  ALKPHOS 65  BILITOT 0.5  PROT 6.9  ALBUMIN 3.9   No results for input(s): LIPASE, AMYLASE in  the last 168 hours. No results for input(s): AMMONIA in the last 168 hours. CBC:  Recent Labs Lab 12/12/14 1322  WBC 7.6  NEUTROABS 4.9  HGB 12.7*  HCT 39.8  MCV 87.1  PLT 353   Cardiac Enzymes: No results for input(s): CKTOTAL, CKMB, CKMBINDEX, TROPONINI in the last 168 hours. BNP (last 3 results) No results for input(s): PROBNP in the last 8760  hours. CBG:  Recent Labs Lab 12/12/14 1318  GLUCAP 91    Recent Results (from the past 240 hour(s))  Blood culture (routine x 2)     Status: None (Preliminary result)   Collection Time: 12/12/14  4:20 PM  Result Value Ref Range Status   Specimen Description BLOOD LFA  Final   Special Requests BOTTLES DRAWN AEROBIC AND ANAEROBIC 5CC  Final   Culture   Final           BLOOD CULTURE RECEIVED NO GROWTH TO DATE CULTURE WILL BE HELD FOR 5 DAYS BEFORE ISSUING A FINAL NEGATIVE REPORT Performed at [facility]    Report Status PENDING  Incomplete          Studies: Dg Chest 2 View  12/12/2014   CLINICAL DATA:  Altered mental status, history stroke, hypertension, former smoker  EXAM: CHEST  2 VIEW  COMPARISON:  10/08/2014  FINDINGS: Significantly rotated to the RIGHT.  Skin folds project over LEFT lung.  Normal heart size, mediastinal contours and pulmonary vascularity.  Emphysematous changes with biapical scarring.  No definite infiltrate, pleural effusion or pneumothorax.  Bones demineralized.  IMPRESSION: COPD changes with biapical scarring.   Electronically Signed   By: Lavonia Dana M.D.   On: 12/12/2014 14:18   Ct Head Wo Contrast  12/12/2014   CLINICAL DATA:  Altered mental status, confusion, hypotension.  EXAM: CT HEAD WITHOUT CONTRAST  TECHNIQUE: Contiguous axial images were obtained from the base of the skull through the vertex without intravenous contrast.  COMPARISON:  MR brain 10/10/2014 and CT head 10/08/2014.  FINDINGS: No evidence of an acute infarct, acute hemorrhage, mass lesion, mass effect or hydrocephalus. Atrophy. Extensive periventricular low attenuation. Scattered remote infarcts in the basal ganglia, thalamus and brainstem. Visualized portions of the paranasal sinuses and mastoid air cells are clear.  IMPRESSION: 1. No acute intracranial abnormality. 2. Atrophy, chronic microvascular white matter ischemic changes and remote infarcts.   Electronically Signed   By:  Lorin Picket M.D.   On: 12/12/2014 14:27        Scheduled Meds: . atorvastatin  20 mg Oral q1800  . cefTRIAXone (ROCEPHIN)  IV  1 g Intravenous Q24H  . clopidogrel  75 mg Oral Q breakfast  . enoxaparin (LOVENOX) injection  40 mg Subcutaneous Q24H  . escitalopram  20 mg Oral Q breakfast  . sodium chloride  1,000 mL Intravenous Once  . tamsulosin  0.4 mg Oral QHS   Continuous Infusions:    Principal Problem:   UTI (lower urinary tract infection) Active Problems:   Acute encephalopathy   Essential hypertension   Right hemiparesis   Dysphagia, post-stroke   Dysarthria due to recent cerebral infarction    Time spent: 20 minutes.    Vernell Leep, MD, FACP, FHM. [facility] Pager [phone]  If 7PM-7AM, please contact night-coverage [email or web address] Password [email or web address] 12/13/2014, 4:25 PM    LOS: 0 days

## 2014-12-13 NOTE — Progress Notes (Signed)
OT Cancellation Note  Patient Details Name: [name] MRN: [id number] DOB: [birth year]   Cancelled Treatment:    Reason Eval/Treat Not Completed: Other (comment) (Note in this patient's chart that wife does not want OT acute consult at this time. ) Please refer to Joycelyn Schmid, RN note for more information regarding this. Please re-order OT as/if needed. Thank you for the original order.  Thurza Kwiecinski , MS, OTR/L, CLT Pager: [phone]  12/13/2014, 3:12 PM

## 2014-12-14 DIAGNOSIS — G8190 Hemiplegia, unspecified affecting unspecified side: Secondary | ICD-10-CM

## 2014-12-14 NOTE — Discharge Instructions (Signed)
Confusion Confusion is the inability to think with your usual speed or clarity. Confusion may come on quickly or slowly over time. How quickly the confusion comes on depends on the cause. Confusion can be due to any number of causes. CAUSES   Concussion, head injury, or head trauma.  Seizures.  Stroke.  Fever.  Brain tumor.  Age related decreased brain function (dementia).  Heightened emotional states like rage or terror.  Mental illness in which the person loses the ability to determine what is real and what is not (hallucinations).  Infections such as a urinary tract infection (UTI).  Toxic effects from alcohol, drugs, or prescription medicines.  Dehydration and an imbalance of salts in the body (electrolytes).  Lack of sleep.  Low blood sugar (diabetes).  Low levels of oxygen from conditions such as chronic lung disorders.  Drug interactions or other medicine side effects.  Nutritional deficiencies, especially niacin, thiamine, vitamin C, or vitamin B.  Sudden drop in body temperature (hypothermia).  Change in routine, such as when traveling or hospitalized. SIGNS AND SYMPTOMS  People often describe their thinking as cloudy or unclear when they are confused. Confusion can also include feeling disoriented. That means you are unaware of where or who you are. You may also not know what the date or time is. If confused, you may also have difficulty paying attention, remembering, and making decisions. Some people also act aggressively when they are confused.  DIAGNOSIS  The medical evaluation of confusion may include:  Blood and urine tests.  X-rays.  Brain and nervous system tests.  Analyzing your brain waves (electroencephalogram or EEG).  Magnetic resonance imaging (MRI) of your head.  Computed tomography (CT) scan of your head.  Mental status tests in which your health care provider may ask many questions. Some of these questions may seem silly or strange,  but they are a very important test to help diagnose and treat confusion. TREATMENT  An admission to the hospital may not be needed, but a person with confusion should not be left alone. Stay with a family member or friend until the confusion clears. Avoid alcohol, pain relievers, or sedative drugs until you have fully recovered. Do not drive until directed by your health care provider. HOME CARE INSTRUCTIONS  What family and friends can do:  To find out if someone is confused, ask the person to state his or her name, age, and the date. If the person is unsure or answers incorrectly, he or she is confused.  Always introduce yourself, no matter how well the person knows you.  Often remind the person of his or her location.  Place a calendar and clock near the confused person.  Help the person with his or her medicines. You may want to use a pill box, an alarm as a reminder, or give the person each dose as prescribed.  Talk about current events and plans for the day.  Try to keep the environment calm, quiet, and peaceful.  Make sure the person keeps follow-up visits with his or her health care provider. PREVENTION  Ways to prevent confusion:  Avoid alcohol.  Eat a balanced diet.  Get enough sleep.  Take medicine only as directed by your health care provider.  Do not become isolated. Spend time with other people and make plans for your days.  Keep careful watch on your blood sugar levels if you are diabetic. SEEK IMMEDIATE MEDICAL CARE IF:   You develop severe headaches, repeated vomiting, seizures, blackouts, or   slurred speech.  There is increasing confusion, weakness, numbness, restlessness, or personality changes.  You develop a loss of balance, have marked dizziness, feel uncoordinated, or fall.  You have delusions, hallucinations, or develop severe anxiety.  Your family members think you need to be rechecked. Document Released: 08/20/2004 Document Revised: 11/27/2013  Document Reviewed: 08/18/2013 ExitCare Patient Information 2015 [employer]. This information is not intended to replace advice given to you by your health care provider. Make sure you discuss any questions you have with your health care provider.  

## 2014-12-14 NOTE — Progress Notes (Signed)
Physical Therapy Treatment Patient Details Name: [name] MRN: [id number] DOB: [birth year] Today's Date: 12/14/2014    History of Present Illness 81 y.o. male with history of CVA with residual right hemiparesis, HTN, prostate cancer, pneumonia, UTIs-on nitrofurantoin prophylaxis, lives at home with spouse, ambulates with the help of a hemiwalker, presented to the [facility] ED on 12/12/14 with confusion and belligerence. CT of head negative for acute changes. Dx of likely UTI.     PT Comments    Pt progressing;  aware of note from  [place] regarding pt wife not wanting OT eval, note also refers to PT(?), pt already eval'd by PT before note written and no family present for PT today; concerned without PT in hospital pt will regress with mobility; will follow for now, please update chart if family wishes otherwise;  Follow Up Recommendations  Home health PT;Supervision/Assistance - 24 hour     Equipment Recommendations  None recommended by PT    Recommendations for Other Services  (pt does not want OT per notes)     Precautions / Restrictions Precautions Precautions: Fall Precaution Comments: R hemiparesis, flexor tone UE/LE Required Braces or Orthoses: Other Brace/Splint Other Brace/Splint: R AFO Restrictions Weight Bearing Restrictions: No    Mobility  Bed Mobility Overal bed mobility: Needs Assistance Bed Mobility: Supine to Sit     Supine to sit: Mod assist     General bed mobility comments: assist/facilitation to iniate movement LEs to EOB, facilitation to bring trunk forward, requires multi-modal cues and incr time  Transfers Overall transfer level: Needs assistance Equipment used: Hemi-walker Transfers: Sit to/from [facility] Sit to Stand: Mod assist Stand pivot transfers: +2 safety/equipment;+2 physical assistance;Min assist;Mod assist       General transfer comment: sit to stand transfer repeated for instruction, activity  tolerance; multi-modal cues for safety, technique, sequencing for standpivot;  pt has difficulty WBing on RLE d/t flexor tone  Ambulation/Gait                 Stairs            Wheelchair Mobility    Modified Rankin (Stroke Patients Only)       Balance Overall balance assessment: Needs assistance Sitting-balance support: Feet supported;Single extremity supported Sitting balance-Leahy Scale: Fair Sitting balance - Comments: pt requires assist for reciprocal scooting in sitting Postural control: Posterior lean;Right lateral lean Standing balance support: During functional activity;Single extremity supported Standing balance-Leahy Scale: Poor (zero to poor, pt variable) Standing balance comment: difficulty to work in static stand d/t pt attempting to move fwd; R lateral and posterior lean                    Cognition Arousal/Alertness: Awake/alert Behavior During Therapy: WFL for tasks assessed/performed;Flat affect Overall Cognitive Status: No family/caregiver present to determine baseline cognitive functioning Area of Impairment: Safety/judgement         Safety/Judgement: Decreased awareness of safety     General Comments: pt is oriented at time of PT session but NT reports he is intermittently confused;     Exercises      General Comments        Pertinent Vitals/Pain Pain Assessment: Faces Faces Pain Scale: Hurts little more Pain Location: RUE and RLE with imposed movement, gentle ROM Pain Descriptors / Indicators: Grimacing Pain Intervention(s): Limited activity within patient's tolerance;Monitored during session;Repositioned    Home Living  Prior Function            PT Goals (current goals can now be found in the care plan section) Acute Rehab PT Goals Patient Stated Goal: return home Time For Goal Achievement: 12/27/14 Potential to Achieve Goals: Fair Progress towards PT goals: Progressing toward  goals    Frequency  Min 3X/week    PT Plan Current plan remains appropriate    Co-evaluation             End of Session Equipment Utilized During Treatment: Gait belt;Other (comment) (R AFO) Activity Tolerance: Patient tolerated treatment well Patient left: in chair;with call bell/phone within reach;with chair alarm set     Time: 1002-1027 PT Time Calculation (min) (ACUTE ONLY): 25 min  Charges:  $Therapeutic Activity: 23-37 mins                    G Codes:      Augustin Bun 12-28-14, 11:51 AM

## 2014-12-14 NOTE — Discharge Summary (Addendum)
Physician Discharge Summary  [name]:[id number] DOB: 05-01-1926 DOA: 12/12/2014  PCP: Simona Huh, MD  Admit date: 12/12/2014 Discharge date: 12/14/2014  Time spent: Less than 30 minutes  Recommendations for Outpatient Follow-up:  1. Dr. Gaynelle Arabian, PCP in 5-7 days. Please follow final blood culture results that were sent from the hospital.  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  Filed Weights   12/13/14 0526 12/14/14 0451  Weight: 56.1 kg (123 lb 10.9 oz) 56.6 kg (124 lb 12.5 oz)    History of present illness:  81 y.o. male with history of CVA with residual right hemiparesis, HTN, prostate cancer, pneumonia, UTIs-on nitrofurantoin prophylaxis, lives at home with spouse, ambulates with the help of a hemiwalker, presented to the [facility] ED on 12/12/14 with confusion and belligerence. Admitted for acute encephalopathy possibly d/t UTI. CT Head: no acute findings.  Hospital Course:   Even though patient had no typical UTI symptoms (history not reliable since patient was confused), afebrile and no leukocytosis, urine microscopy shows significant leukocytosis and patient has had similar presentation during previous episodes of UTI. Hence he was hospitalized and started empirically on IV Rocephin pending urine culture results. Urine cultures today shows no growth and hence unlikely to have UTI-Rocephin discontinued. Etiology of his mental status changes is not entirely clear. However patient's mental status has significantly improved as per spouse but not yet at baseline. Advised spouse that we could proceed with further evaluation i.e. MRI brain to rule  out new strokes which may also contribute to mental status changes. Patient's spouse however declined this offer and stated that she would take him home with home health therapies and follow-up with his PCP. Advised her to seek immediate medical attention if there was any decline in his condition and she verbalized understanding. ? Vascular or senile dementia. His hypertension is controlled. Patient has history of CVA with residual right hemiparesis/contractures and is on Plavix and statins for same. He follows with Dr. Leonie Man, at [facility]. Patient has been on regular consistency diet and nectar thickened liquids at home which he tolerated in the hospital as well.   Consultations:  None  Procedures:  None    Discharge Exam:  Complaints:  Patient denied complaints. As per spouse, mental status has significantly improved compared to yesterday.  Filed Vitals:   12/13/14 1449 12/13/14 2143 12/14/14 0451 12/14/14 1354  BP: 125/72 136/85 162/73 131/57  Pulse: 88 77 73 75  Temp: 98.1 F (36.7 C) 99 F (37.2 C) 97.3 F (36.3 C) 98 F (36.7 C)  TempSrc: Oral Oral Oral Oral  Resp: 20 20 20 18   Weight:   56.6 kg (124 lb 12.5 oz)   SpO2: 98% 96% 97% 99%    General exam: Pleasant elderly male sitting up in bed eating breakfast with his left hand this morning. Respiratory system: Clear. No increased work of breathing. Cardiovascular system: S1 & S2 heard, RRR. No JVD, murmurs, gallops, clicks or pedal edema. Gastrointestinal system: Abdomen is nondistended, soft and nontender. Normal bowel sounds heard. Central nervous system: Alert and oriented to person, place and partly to time. No new focal neurological deficits. Has old facial asymmetry and? Dysarthria Extremities: Left limbs grade  5 x 5 power. Right limbs with old hemiparesis and some contractures.  Discharge Instructions      Discharge Instructions    Call MD for:  extreme fatigue    Complete by:  As directed      Call  MD for:  persistant dizziness or light-headedness    Complete by:  As directed      Call MD for:  severe uncontrolled pain    Complete by:  As directed      Call MD for:  temperature >100.4    Complete by:  As directed      Call MD for:    Complete by:  As directed   Worsening confusion.     Diet - low sodium heart healthy    Complete by:  As directed   Nectar thickened liquids as before.     Increase activity slowly    Complete by:  As directed             Medication List    TAKE these medications        acetaminophen 500 MG tablet  Commonly known as:  TYLENOL  Take 500 mg by mouth every 6 (six) hours as needed for mild pain, moderate pain, fever or headache.     atorvastatin 20 MG tablet  Commonly known as:  LIPITOR  Take 1 tablet (20 mg total) by mouth daily at 6 PM.     clopidogrel 75 MG tablet  Commonly known as:  PLAVIX  Take 1 tablet (75 mg total) by mouth daily.     escitalopram 20 MG tablet  Commonly known as:  LEXAPRO  Take 20 mg by mouth daily with breakfast.     nitrofurantoin 50 MG capsule  Commonly known as:  MACRODANTIN  Take 50 mg by mouth at bedtime.     tamsulosin 0.4 MG Caps capsule  Commonly known as:  FLOMAX  Take 0.4 mg by mouth at bedtime.       Follow-up Information    Follow up with Simona Huh, MD. Schedule an appointment as soon as possible for a visit in 5 days.   Specialty:  Family Medicine   Why:  Hospital follow-up.   Contact information:   [address] [place] [address] [phone]        The results of significant diagnostics from this hospitalization (including imaging, microbiology, ancillary and laboratory) are listed below for reference.    Significant Diagnostic Studies: Dg Chest 2 View  12/12/2014   CLINICAL DATA:  Altered mental status, history stroke, hypertension, former smoker  EXAM: CHEST  2 VIEW  COMPARISON:  10/08/2014  FINDINGS: Significantly rotated to the RIGHT.  Skin folds  project over LEFT lung.  Normal heart size, mediastinal contours and pulmonary vascularity.  Emphysematous changes with biapical scarring.  No definite infiltrate, pleural effusion or pneumothorax.  Bones demineralized.  IMPRESSION: COPD changes with biapical scarring.   Electronically Signed   By: Lavonia Dana M.D.   On: 12/12/2014 14:18   Ct Head Wo Contrast  12/12/2014   CLINICAL DATA:  Altered mental status, confusion, hypotension.  EXAM: CT HEAD WITHOUT CONTRAST  TECHNIQUE: Contiguous axial images were obtained from the base of the skull through the vertex without intravenous contrast.  COMPARISON:  MR brain 10/10/2014 and CT head 10/08/2014.  FINDINGS: No evidence of an acute infarct, acute hemorrhage, mass lesion, mass effect or hydrocephalus. Atrophy. Extensive periventricular low attenuation. Scattered remote infarcts in the basal ganglia,  thalamus and brainstem. Visualized portions of the paranasal sinuses and mastoid air cells are clear.  IMPRESSION: 1. No acute intracranial abnormality. 2. Atrophy, chronic microvascular white matter ischemic changes and remote infarcts.   Electronically Signed   By: Lorin Picket M.D.   On: 12/12/2014 14:27    Microbiology: Recent Results (from the past 240 hour(s))  Urine culture     Status: None   Collection Time: 12/12/14  1:26 PM  Result Value Ref Range Status   Specimen Description URINE, CLEAN CATCH  Final   Special Requests NONE  Final   Colony Count NO GROWTH Performed at [facility]   Final   Culture NO GROWTH Performed at [facility]   Final   Report Status 12/13/2014 FINAL  Final  Blood culture (routine x 2)     Status: None (Preliminary result)   Collection Time: 12/12/14  4:20 PM  Result Value Ref Range Status   Specimen Description BLOOD LFA  Final   Special Requests BOTTLES DRAWN AEROBIC AND ANAEROBIC 5CC  Final   Culture   Final           BLOOD CULTURE RECEIVED NO GROWTH TO DATE CULTURE WILL BE HELD FOR 5 DAYS  BEFORE ISSUING A FINAL NEGATIVE REPORT Performed at [facility]    Report Status PENDING  Incomplete     Labs: Basic Metabolic Panel:  Recent Labs Lab 12/12/14 1322  NA 138  K 4.1  CL 103  CO2 25  GLUCOSE 101*  BUN 21*  CREATININE 0.80  CALCIUM 9.6   Liver Function Tests:  Recent Labs Lab 12/12/14 1322  AST 18  ALT 20  ALKPHOS 65  BILITOT 0.5  PROT 6.9  ALBUMIN 3.9   No results for input(s): LIPASE, AMYLASE in the last 168 hours. No results for input(s): AMMONIA in the last 168 hours. CBC:  Recent Labs Lab 12/12/14 1322  WBC 7.6  NEUTROABS 4.9  HGB 12.7*  HCT 39.8  MCV 87.1  PLT 353   Cardiac Enzymes: No results for input(s): CKTOTAL, CKMB, CKMBINDEX, TROPONINI in the last 168 hours. BNP: BNP (last 3 results) No results for input(s): BNP in the last 8760 hours.  ProBNP (last 3 results) No results for input(s): PROBNP in the last 8760 hours.  CBG:  Recent Labs Lab 12/12/14 1318  GLUCAP 91      Signed:  Waylynn Benefiel, MD, FACP, FHM. [facility] Pager [phone]  If 7PM-7AM, please contact night-coverage [email or web address] Password [email or web address] 12/14/2014, 2:17 PM

## 2014-12-14 NOTE — Progress Notes (Signed)
Pt left at this time with his wife and caregiver. Pt alert and without c/o. Discharge instructions given/explained with family verbalizing understanding. Family made aware of pt appointment this Monday with PCP at 1400 on 12/17/14.

## 2014-12-15 DIAGNOSIS — I69391 Dysphagia following cerebral infarction: Secondary | ICD-10-CM | POA: Diagnosis not present

## 2014-12-15 DIAGNOSIS — I69351 Hemiplegia and hemiparesis following cerebral infarction affecting right dominant side: Secondary | ICD-10-CM | POA: Diagnosis not present

## 2014-12-15 DIAGNOSIS — Z8546 Personal history of malignant neoplasm of prostate: Secondary | ICD-10-CM | POA: Diagnosis not present

## 2014-12-15 DIAGNOSIS — R1310 Dysphagia, unspecified: Secondary | ICD-10-CM | POA: Diagnosis not present

## 2014-12-15 DIAGNOSIS — I10 Essential (primary) hypertension: Secondary | ICD-10-CM | POA: Diagnosis not present

## 2014-12-15 DIAGNOSIS — R413 Other amnesia: Secondary | ICD-10-CM | POA: Diagnosis not present

## 2014-12-17 DIAGNOSIS — I10 Essential (primary) hypertension: Secondary | ICD-10-CM | POA: Diagnosis not present

## 2014-12-17 DIAGNOSIS — R1310 Dysphagia, unspecified: Secondary | ICD-10-CM | POA: Diagnosis not present

## 2014-12-17 DIAGNOSIS — R413 Other amnesia: Secondary | ICD-10-CM | POA: Diagnosis not present

## 2014-12-17 DIAGNOSIS — Z8546 Personal history of malignant neoplasm of prostate: Secondary | ICD-10-CM | POA: Diagnosis not present

## 2014-12-17 DIAGNOSIS — I69391 Dysphagia following cerebral infarction: Secondary | ICD-10-CM | POA: Diagnosis not present

## 2014-12-17 DIAGNOSIS — I69351 Hemiplegia and hemiparesis following cerebral infarction affecting right dominant side: Secondary | ICD-10-CM | POA: Diagnosis not present

## 2014-12-17 DIAGNOSIS — I639 Cerebral infarction, unspecified: Secondary | ICD-10-CM | POA: Diagnosis not present

## 2014-12-18 DIAGNOSIS — Z8546 Personal history of malignant neoplasm of prostate: Secondary | ICD-10-CM | POA: Diagnosis not present

## 2014-12-18 DIAGNOSIS — I10 Essential (primary) hypertension: Secondary | ICD-10-CM | POA: Diagnosis not present

## 2014-12-18 DIAGNOSIS — I69351 Hemiplegia and hemiparesis following cerebral infarction affecting right dominant side: Secondary | ICD-10-CM | POA: Diagnosis not present

## 2014-12-18 DIAGNOSIS — I69391 Dysphagia following cerebral infarction: Secondary | ICD-10-CM | POA: Diagnosis not present

## 2014-12-18 DIAGNOSIS — R413 Other amnesia: Secondary | ICD-10-CM | POA: Diagnosis not present

## 2014-12-18 DIAGNOSIS — R1310 Dysphagia, unspecified: Secondary | ICD-10-CM | POA: Diagnosis not present

## 2014-12-18 LAB — CULTURE, BLOOD (ROUTINE X 2): CULTURE: NO GROWTH

## 2014-12-19 DIAGNOSIS — I69351 Hemiplegia and hemiparesis following cerebral infarction affecting right dominant side: Secondary | ICD-10-CM | POA: Diagnosis not present

## 2014-12-19 DIAGNOSIS — R1310 Dysphagia, unspecified: Secondary | ICD-10-CM | POA: Diagnosis not present

## 2014-12-19 DIAGNOSIS — Z8546 Personal history of malignant neoplasm of prostate: Secondary | ICD-10-CM | POA: Diagnosis not present

## 2014-12-19 DIAGNOSIS — I69391 Dysphagia following cerebral infarction: Secondary | ICD-10-CM | POA: Diagnosis not present

## 2014-12-19 DIAGNOSIS — I10 Essential (primary) hypertension: Secondary | ICD-10-CM | POA: Diagnosis not present

## 2014-12-19 DIAGNOSIS — R413 Other amnesia: Secondary | ICD-10-CM | POA: Diagnosis not present

## 2014-12-20 DIAGNOSIS — I10 Essential (primary) hypertension: Secondary | ICD-10-CM | POA: Diagnosis not present

## 2014-12-20 DIAGNOSIS — Z8546 Personal history of malignant neoplasm of prostate: Secondary | ICD-10-CM | POA: Diagnosis not present

## 2014-12-20 DIAGNOSIS — I69351 Hemiplegia and hemiparesis following cerebral infarction affecting right dominant side: Secondary | ICD-10-CM | POA: Diagnosis not present

## 2014-12-20 DIAGNOSIS — I69391 Dysphagia following cerebral infarction: Secondary | ICD-10-CM | POA: Diagnosis not present

## 2014-12-20 DIAGNOSIS — R1310 Dysphagia, unspecified: Secondary | ICD-10-CM | POA: Diagnosis not present

## 2014-12-20 DIAGNOSIS — R413 Other amnesia: Secondary | ICD-10-CM | POA: Diagnosis not present

## 2014-12-21 DIAGNOSIS — R1310 Dysphagia, unspecified: Secondary | ICD-10-CM | POA: Diagnosis not present

## 2014-12-21 DIAGNOSIS — I69351 Hemiplegia and hemiparesis following cerebral infarction affecting right dominant side: Secondary | ICD-10-CM | POA: Diagnosis not present

## 2014-12-21 DIAGNOSIS — R413 Other amnesia: Secondary | ICD-10-CM | POA: Diagnosis not present

## 2014-12-21 DIAGNOSIS — Z8546 Personal history of malignant neoplasm of prostate: Secondary | ICD-10-CM | POA: Diagnosis not present

## 2014-12-21 DIAGNOSIS — I10 Essential (primary) hypertension: Secondary | ICD-10-CM | POA: Diagnosis not present

## 2014-12-21 DIAGNOSIS — I69391 Dysphagia following cerebral infarction: Secondary | ICD-10-CM | POA: Diagnosis not present

## 2014-12-25 DIAGNOSIS — R1310 Dysphagia, unspecified: Secondary | ICD-10-CM | POA: Diagnosis not present

## 2014-12-25 DIAGNOSIS — I69391 Dysphagia following cerebral infarction: Secondary | ICD-10-CM | POA: Diagnosis not present

## 2014-12-25 DIAGNOSIS — R413 Other amnesia: Secondary | ICD-10-CM | POA: Diagnosis not present

## 2014-12-25 DIAGNOSIS — I69351 Hemiplegia and hemiparesis following cerebral infarction affecting right dominant side: Secondary | ICD-10-CM | POA: Diagnosis not present

## 2014-12-25 DIAGNOSIS — Z8546 Personal history of malignant neoplasm of prostate: Secondary | ICD-10-CM | POA: Diagnosis not present

## 2014-12-25 DIAGNOSIS — I10 Essential (primary) hypertension: Secondary | ICD-10-CM | POA: Diagnosis not present

## 2014-12-26 DIAGNOSIS — I10 Essential (primary) hypertension: Secondary | ICD-10-CM | POA: Diagnosis not present

## 2014-12-26 DIAGNOSIS — I69351 Hemiplegia and hemiparesis following cerebral infarction affecting right dominant side: Secondary | ICD-10-CM | POA: Diagnosis not present

## 2014-12-26 DIAGNOSIS — I69391 Dysphagia following cerebral infarction: Secondary | ICD-10-CM | POA: Diagnosis not present

## 2014-12-26 DIAGNOSIS — Z8546 Personal history of malignant neoplasm of prostate: Secondary | ICD-10-CM | POA: Diagnosis not present

## 2014-12-26 DIAGNOSIS — R1310 Dysphagia, unspecified: Secondary | ICD-10-CM | POA: Diagnosis not present

## 2014-12-26 DIAGNOSIS — R413 Other amnesia: Secondary | ICD-10-CM | POA: Diagnosis not present

## 2014-12-27 ENCOUNTER — Emergency Department (HOSPITAL_COMMUNITY): Payer: Medicare Other

## 2014-12-27 ENCOUNTER — Emergency Department (HOSPITAL_COMMUNITY)
Admission: EM | Admit: 2014-12-27 | Discharge: 2014-12-27 | Disposition: A | Discharge status: Home / Self Care | Payer: Medicare Other | Attending: Emergency Medicine | Admitting: Emergency Medicine

## 2014-12-27 ENCOUNTER — Encounter (HOSPITAL_COMMUNITY): Payer: Self-pay | Admitting: Emergency Medicine

## 2014-12-27 DIAGNOSIS — R911 Solitary pulmonary nodule: Secondary | ICD-10-CM | POA: Diagnosis not present

## 2014-12-27 DIAGNOSIS — M47812 Spondylosis without myelopathy or radiculopathy, cervical region: Secondary | ICD-10-CM | POA: Diagnosis not present

## 2014-12-27 DIAGNOSIS — I639 Cerebral infarction, unspecified: Secondary | ICD-10-CM | POA: Diagnosis not present

## 2014-12-27 DIAGNOSIS — W1839XA Other fall on same level, initial encounter: Secondary | ICD-10-CM | POA: Insufficient documentation

## 2014-12-27 DIAGNOSIS — R413 Other amnesia: Secondary | ICD-10-CM | POA: Diagnosis not present

## 2014-12-27 DIAGNOSIS — M545 Low back pain: Secondary | ICD-10-CM | POA: Diagnosis not present

## 2014-12-27 DIAGNOSIS — M5032 Other cervical disc degeneration, mid-cervical region: Secondary | ICD-10-CM | POA: Diagnosis not present

## 2014-12-27 DIAGNOSIS — S32000A Wedge compression fracture of unspecified lumbar vertebra, initial encounter for closed fracture: Secondary | ICD-10-CM | POA: Diagnosis not present

## 2014-12-27 DIAGNOSIS — G8191 Hemiplegia, unspecified affecting right dominant side: Secondary | ICD-10-CM | POA: Insufficient documentation

## 2014-12-27 DIAGNOSIS — Z87891 Personal history of nicotine dependence: Secondary | ICD-10-CM | POA: Insufficient documentation

## 2014-12-27 DIAGNOSIS — S0990XA Unspecified injury of head, initial encounter: Secondary | ICD-10-CM | POA: Diagnosis not present

## 2014-12-27 DIAGNOSIS — F0390 Unspecified dementia without behavioral disturbance: Secondary | ICD-10-CM | POA: Diagnosis not present

## 2014-12-27 DIAGNOSIS — Y9389 Activity, other specified: Secondary | ICD-10-CM | POA: Insufficient documentation

## 2014-12-27 DIAGNOSIS — Z859 Personal history of malignant neoplasm, unspecified: Secondary | ICD-10-CM | POA: Insufficient documentation

## 2014-12-27 DIAGNOSIS — Z8673 Personal history of transient ischemic attack (TIA), and cerebral infarction without residual deficits: Secondary | ICD-10-CM | POA: Insufficient documentation

## 2014-12-27 DIAGNOSIS — I10 Essential (primary) hypertension: Secondary | ICD-10-CM | POA: Insufficient documentation

## 2014-12-27 DIAGNOSIS — Z79899 Other long term (current) drug therapy: Secondary | ICD-10-CM | POA: Diagnosis not present

## 2014-12-27 DIAGNOSIS — Y92002 Bathroom of unspecified non-institutional (private) residence single-family (private) house as the place of occurrence of the external cause: Secondary | ICD-10-CM | POA: Insufficient documentation

## 2014-12-27 DIAGNOSIS — M5134 Other intervertebral disc degeneration, thoracic region: Secondary | ICD-10-CM | POA: Diagnosis not present

## 2014-12-27 DIAGNOSIS — S32020A Wedge compression fracture of second lumbar vertebra, initial encounter for closed fracture: Secondary | ICD-10-CM | POA: Diagnosis not present

## 2014-12-27 DIAGNOSIS — R1310 Dysphagia, unspecified: Secondary | ICD-10-CM | POA: Diagnosis not present

## 2014-12-27 DIAGNOSIS — I69351 Hemiplegia and hemiparesis following cerebral infarction affecting right dominant side: Secondary | ICD-10-CM | POA: Diagnosis not present

## 2014-12-27 DIAGNOSIS — M549 Dorsalgia, unspecified: Secondary | ICD-10-CM

## 2014-12-27 DIAGNOSIS — IMO0001 Reserved for inherently not codable concepts without codable children: Secondary | ICD-10-CM

## 2014-12-27 DIAGNOSIS — Z7902 Long term (current) use of antithrombotics/antiplatelets: Secondary | ICD-10-CM | POA: Insufficient documentation

## 2014-12-27 DIAGNOSIS — Y998 Other external cause status: Secondary | ICD-10-CM | POA: Insufficient documentation

## 2014-12-27 DIAGNOSIS — Z8546 Personal history of malignant neoplasm of prostate: Secondary | ICD-10-CM | POA: Diagnosis not present

## 2014-12-27 DIAGNOSIS — M4312 Spondylolisthesis, cervical region: Secondary | ICD-10-CM | POA: Diagnosis not present

## 2014-12-27 DIAGNOSIS — I69391 Dysphagia following cerebral infarction: Secondary | ICD-10-CM | POA: Diagnosis not present

## 2014-12-27 DIAGNOSIS — S3992XA Unspecified injury of lower back, initial encounter: Secondary | ICD-10-CM | POA: Diagnosis present

## 2014-12-27 MED ORDER — ONDANSETRON 8 MG PO TBDP
8.0000 mg | ORAL_TABLET | Freq: Once | ORAL | Status: AC
  Administered 2014-12-27: 8 mg via ORAL
  Filled 2014-12-27: qty 1

## 2014-12-27 MED ORDER — TRAMADOL-ACETAMINOPHEN 37.5-325 MG PO TABS: 1.0000 | ORAL_TABLET | Freq: Four times a day (QID) | ORAL | Status: DC | PRN

## 2014-12-27 NOTE — Discharge Instructions (Signed)
Back Pain, Adult Low back pain is very common. About 1 in 5 people have back pain.The cause of low back pain is rarely dangerous. The pain often gets better over time.About half of people with a sudden onset of back pain feel better in just 2 weeks. About 8 in 10 people feel better by 6 weeks.  CAUSES Some common causes of back pain include:  Strain of the muscles or ligaments supporting the spine.  Wear and tear (degeneration) of the spinal discs.  Arthritis.  Direct injury to the back. DIAGNOSIS Most of the time, the direct cause of low back pain is not known.However, back pain can be treated effectively even when the exact cause of the pain is unknown.Answering your caregiver's questions about your overall health and symptoms is one of the most accurate ways to make sure the cause of your pain is not dangerous. If your caregiver needs more information, he or she may order lab work or imaging tests (X-rays or MRIs).However, even if imaging tests show changes in your back, this usually does not require surgery. HOME CARE INSTRUCTIONS For many people, back pain returns.Since low back pain is rarely dangerous, it is often a condition that people can learn to [facility] their own.   Remain active. It is stressful on the back to sit or stand in one place. Do not sit, drive, or stand in one place for more than 30 minutes at a time. Take short walks on level surfaces as soon as pain allows.Try to increase the length of time you walk each day.  Do not stay in bed.Resting more than 1 or 2 days can delay your recovery.  Do not avoid exercise or work.Your body is made to move.It is not dangerous to be active, even though your back may hurt.Your back will likely heal faster if you return to being active before your pain is gone.  Pay attention to your body when you bend and lift. Many people have less discomfortwhen lifting if they bend their knees, keep the load close to their bodies,and  avoid twisting. Often, the most comfortable positions are those that put less stress on your recovering back.  Find a comfortable position to sleep. Use a firm mattress and lie on your side with your knees slightly bent. If you lie on your back, put a pillow under your knees.  Only take over-the-counter or prescription medicines as directed by your caregiver. Over-the-counter medicines to reduce pain and inflammation are often the most helpful.Your caregiver may prescribe muscle relaxant drugs.These medicines help dull your pain so you can more quickly return to your normal activities and healthy exercise.  Put ice on the injured area.  Put ice in a plastic bag.  Place a towel between your skin and the bag.  Leave the ice on for 15-20 minutes, 03-04 times a day for the first 2 to 3 days. After that, ice and heat may be alternated to reduce pain and spasms.  Ask your caregiver about trying back exercises and gentle massage. This may be of some benefit.  Avoid feeling anxious or stressed.Stress increases muscle tension and can worsen back pain.It is important to recognize when you are anxious or stressed and learn ways to manage it.Exercise is a great option. SEEK MEDICAL CARE IF:  You have pain that is not relieved with rest or medicine.  You have pain that does not improve in 1 week.  You have new symptoms.  You are generally not feeling well. SEEK  IMMEDIATE MEDICAL CARE IF:   You have pain that radiates from your back into your legs.  You develop new bowel or bladder control problems.  You have unusual weakness or numbness in your arms or legs.  You develop nausea or vomiting.  You develop abdominal pain.  You feel faint. Document Released: 07/13/2005 Document Revised: 01/12/2012 Document Reviewed: 11/14/2013 [facility] Patient Information 2015 [place], [place]. This information is not intended to replace advice given to you by your health care provider. Make sure you  discuss any questions you have with your health care provider.  Head Injury You have received a head injury. It does not appear serious at this time. Headaches and vomiting are common following head injury. It should be easy to awaken from sleeping. Sometimes it is necessary for you to stay in the emergency department for a while for observation. Sometimes admission to the hospital may be needed. After injuries such as yours, most problems occur within the first 24 hours, but side effects may occur up to 7-10 days after the injury. It is important for you to carefully monitor your condition and contact your health care provider or seek immediate medical care if there is a change in your condition. WHAT ARE THE TYPES OF HEAD INJURIES? Head injuries can be as minor as a bump. Some head injuries can be more severe. More severe head injuries include:  A jarring injury to the brain (concussion).  A bruise of the brain (contusion). This mean there is bleeding in the brain that can cause swelling.  A cracked skull (skull fracture).  Bleeding in the brain that collects, clots, and forms a bump (hematoma). WHAT CAUSES A HEAD INJURY? A serious head injury is most likely to happen to someone who is in a car wreck and is not wearing a seat belt. Other causes of major head injuries include bicycle or motorcycle accidents, sports injuries, and falls. HOW ARE HEAD INJURIES DIAGNOSED? A complete history of the event leading to the injury and your current symptoms will be helpful in diagnosing head injuries. Many times, pictures of the brain, such as CT or MRI are needed to see the extent of the injury. Often, an overnight hospital stay is necessary for observation.  WHEN SHOULD I SEEK IMMEDIATE MEDICAL CARE?  You should get help right away if:  You have confusion or drowsiness.  You feel sick to your stomach (nauseous) or have continued, forceful vomiting.  You have dizziness or unsteadiness that is getting  worse.  You have severe, continued headaches not relieved by medicine. Only take over-the-counter or prescription medicines for pain, fever, or discomfort as directed by your health care provider.  You do not have normal function of the arms or legs or are unable to walk.  You notice changes in the black spots in the center of the colored part of your eye (pupil).  You have a clear or bloody fluid coming from your nose or ears.  You have a loss of vision. During the next 24 hours after the injury, you must stay with someone who can watch you for the warning signs. This person should contact local emergency services (911 in the [place] if you have seizures, you become unconscious, or you are unable to wake up. HOW CAN I PREVENT A HEAD INJURY IN THE FUTURE? The most important factor for preventing major head injuries is avoiding motor vehicle accidents. To minimize the potential for damage to your head, it is crucial to wear seat belts  while riding in motor vehicles. Wearing helmets while bike riding and playing collision sports (like football) is also helpful. Also, avoiding dangerous activities around the house will further help reduce your risk of head injury.  WHEN CAN I RETURN TO NORMAL ACTIVITIES AND ATHLETICS? You should be reevaluated by your health care provider before returning to these activities. If you have any of the following symptoms, you should not return to activities or contact sports until 1 week after the symptoms have stopped:  Persistent headache.  Dizziness or vertigo.  Poor attention and concentration.  Confusion.  Memory problems.  Nausea or vomiting.  Fatigue or tire easily.  Irritability.  Intolerant of bright lights or loud noises.  Anxiety or depression.  Disturbed sleep. MAKE SURE YOU:   Understand these instructions.  Will watch your condition.  Will get help right away if you are not doing well or get worse. Document Released: 07/13/2005  Document Revised: 07/18/2013 Document Reviewed: 03/20/2013 [facility] Patient Information 2015 [place], [place]. This information is not intended to replace advice given to you by your health care provider. Make sure you discuss any questions you have with your health care provider.  Pulmonary Nodule A pulmonary nodule is a small, round growth of tissue in the lung. Pulmonary nodules can range in size from less than 1/5 inch (4 mm) to a little bigger than an inch (25 mm). Most pulmonary nodules are detected when imaging tests of the lung are being performed for a different problem. Pulmonary nodules are usually not cancerous (benign). However, some pulmonary nodules are cancerous (malignant). Follow-up treatment or testing is based on the size of the pulmonary nodule and your risk of getting lung cancer.  CAUSES Benign pulmonary nodules can be caused by various things. Some of the causes include:   Bacterial, fungal, or viral infections. This is usually an old infection that is no longer active, but it can sometimes be a current, active infection.  A benign mass of tissue.  Inflammation from conditions such as rheumatoid arthritis.   Abnormal blood vessels in the lungs. Malignant pulmonary nodules can result from lung cancer or from cancers that spread to the lung from other places in the body. SIGNS AND SYMPTOMS Pulmonary nodules usually do not cause symptoms. DIAGNOSIS Most often, pulmonary nodules are found incidentally when an X-ray or CT scan is performed to look for some other problem in the lung area. To help determine whether a pulmonary nodule is benign or malignant, your health care provider will take a medical history and order a variety of tests. Tests done may include:   Blood tests.  A skin test called a tuberculin test. This test is used to determine if you have been exposed to the germ that causes tuberculosis.   Chest X-rays. If possible, a new X-ray may be compared with  X-rays you have had in the past.   CT scan. This test shows smaller pulmonary nodules more clearly than an X-ray.   Positron emission tomography (PET) scan. In this test, a safe amount of a radioactive substance is injected into the bloodstream. Then, the scan takes a picture of the pulmonary nodule. The radioactive substance is eliminated from your body in your urine.   Biopsy. A tiny piece of the pulmonary nodule is removed so it can be checked under a microscope. TREATMENT  Pulmonary nodules that are benign normally do not require any treatment because they usually do not cause symptoms or breathing problems. Your health care provider may want  to monitor the pulmonary nodule through follow-up CT scans. The frequency of these CT scans will vary based on the size of the nodule and the risk factors for lung cancer. For example, CT scans will need to be done more frequently if the pulmonary nodule is larger and if you have a history of smoking and a family history of cancer. Further testing or biopsies may be done if any follow-up CT scan shows that the size of the pulmonary nodule has increased. HOME CARE INSTRUCTIONS  Only take over-the-counter or prescription medicines as directed by your health care provider.  Keep all follow-up appointments with your health care provider. SEEK MEDICAL CARE IF:  You have trouble breathing when you are active.   You feel sick or unusually tired.   You do not feel like eating.   You lose weight without trying to.   You develop chills or night sweats.  SEEK IMMEDIATE MEDICAL CARE IF:  You cannot catch your breath, or you begin wheezing.   You cannot stop coughing.   You cough up blood.   You become dizzy or feel like you are going to pass out.   You have sudden chest pain.   You have a fever or persistent symptoms for more than 2-3 days.   You have a fever and your symptoms suddenly get worse. MAKE SURE YOU:  Understand these  instructions.  Will watch your condition.  Will get help right away if you are not doing well or get worse. Document Released: 05/10/2009 Document Revised: 03/15/2013 Document Reviewed: 01/02/2013 [facility] Patient Information 2015 [place], [place]. This information is not intended to replace advice given to you by your health care provider. Make sure you discuss any questions you have with your health care provider.  Vertebral Fracture You have a fracture of one or more vertebra. These are the bony parts that form the spine. Minor vertebral fractures happen when people fall. Osteoporosis is associated with many of these fractures. Hospital care may not be necessary for minor compression fractures that are stable. However, multiple fractures of the spine or unstable injuries can cause severe pain and even damage the spinal cord. A spinal cord injury may cause paralysis, numbness, or loss of normal bowel and bladder control.  Normally there is pain and stiffness in the back for 3 to 6 weeks after a vertebral fracture. Bed rest for several days, pain medicine, and a slow return to activity is often the only treatment that is needed depending on the location of the fracture. Neck and back braces may be helpful in reducing pain and increasing mobility. When your pain allows, you should begin walking or swimming to help maintain your endurance. Exercises to improve motion and to strengthen the back may also be useful after the initial pain improves. Treatment for osteoporosis may be essential for full recovery. This will help reduce your risk of vertebral fractures with a future fall. During the first few days after a spine fracture you may feel nauseated or vomit. If this is severe, hospital care with IV fluids will be needed.  Arrange for follow-up care as recommended to assure proper long-term care and prevention of further spine injury.  SEEK IMMEDIATE MEDICAL CARE IF:  You have increasing pain,  vomiting, or are unable to move around at all.  You develop numbness, tingling, weakness, or paralysis of any part of your body.  You develop a loss of normal bowel or bladder control.  You have difficulty breathing, cough, fever,  chest or abdominal pain. MAKE SURE YOU:   Understand these instructions.  Will watch your condition.  Will get help right away if you are not doing well or get worse. Document Released: 08/20/2004 Document Revised: 10/05/2011 Document Reviewed: 03/05/2009 [facility] Patient Information 2015 [place], [place]. This information is not intended to replace advice given to you by your health care provider. Make sure you discuss any questions you have with your health care provider.

## 2014-12-27 NOTE — ED Provider Notes (Signed)
CSN: [id number]     Arrival date & time 12/27/14  1513 History   First MD Initiated Contact with Patient 12/27/14 1526     Chief Complaint  Patient presents with  . Fall  . Back Pain     (Consider location/radiation/quality/duration/timing/severity/associated sxs/prior Treatment) HPI   [name] is a 81 y.o. male who is with his aide, when he fell. He was inside of the bathroom door closed, and heard him fall. He has been trying to get up from the commode when he fell. He has a chronic gait disability from his stroke. He complains of lower back pain since the fall. He also has a headache since the fall. Wife is with him and states that it is not unusual for him to fall because of his gait problem.  Level V Caveat- Dementia   Past Medical History  Diagnosis Date  . Hypertension   . Cancer   . Stroke    Past Surgical History  Procedure Laterality Date  . Appendectomy    . Kidney surgery     Family History  Problem Relation Age of Onset  . CAD Father    History  Substance Use Topics  . Smoking status: Former Research scientist (life sciences)  . Smokeless tobacco: Not on file  . Alcohol Use: 0.0 oz/week    0 Standard drinks or equivalent per week     Comment: Occasionally    Review of Systems  Unable to perform ROS     Allergies  Review of patient's allergies indicates no known allergies.  Home Medications   Prior to Admission medications   Medication Sig Start Date End Date Taking? Authorizing Provider  acetaminophen (TYLENOL) 500 MG tablet Take 500 mg by mouth every 6 (six) hours as needed for mild pain, moderate pain, fever or headache.   Yes Historical Provider, MD  atorvastatin (LIPITOR) 20 MG tablet Take 1 tablet (20 mg total) by mouth daily at 6 PM. 07/30/14  Yes Ivan Anchors Love, PA-C  clopidogrel (PLAVIX) 75 MG tablet Take 1 tablet (75 mg total) by mouth daily. Patient taking differently: Take 75 mg by mouth daily with breakfast.  10/03/14  Yes Britt Bottom, MD  escitalopram  (LEXAPRO) 20 MG tablet Take 20 mg by mouth daily with breakfast.    Yes Historical Provider, MD  nitrofurantoin (MACRODANTIN) 50 MG capsule Take 50 mg by mouth at bedtime.   Yes Historical Provider, MD  tamsulosin (FLOMAX) 0.4 MG CAPS capsule Take 0.4 mg by mouth at bedtime.    Yes Historical Provider, MD   BP 139/59 mmHg  Pulse 84  Temp(Src) 97.4 F (36.3 C) (Oral)  Resp 16  SpO2 95% Physical Exam  Constitutional: He appears well-developed.  Frail, elderly  HENT:  Head: Normocephalic and atraumatic.  Right Ear: External ear normal.  Left Ear: External ear normal.  No abnormality to the scalp or Face.  Eyes: Conjunctivae and EOM are normal. Pupils are equal, round, and reactive to light.  Neck: Normal range of motion and phonation normal. Neck supple.  Cardiovascular: Normal rate, regular rhythm and normal heart sounds.   Pulmonary/Chest: Effort normal and breath sounds normal. He exhibits no bony tenderness.  Abdominal: Soft. There is no tenderness.  Musculoskeletal:  Mild lower back tenderness, without contusion or deformity.  Neurological: He is alert. No cranial nerve deficit or sensory deficit.  Right hemiparesis with spasticity  Skin: Skin is warm, dry and intact.  Psychiatric: He has a normal mood and affect. His behavior  is normal.  Nursing note and vitals reviewed.   ED Course  Procedures (including critical care time)  Medications  ondansetron (ZOFRAN-ODT) disintegrating tablet 8 mg (8 mg Oral Given 12/27/14 1647)    Patient Vitals for the past 24 hrs:  BP Temp Temp src Pulse Resp SpO2  12/27/14 1512 139/59 mmHg 97.4 F (36.3 C) Oral 84 16 95 %    6:08 PM Reevaluation with update and discussion. After initial assessment and treatment, an updated evaluation reveals no change in clinical status; all findings discussed with patient and wife, all questions answered.Richarda Blade    Labs Review Labs Reviewed - No data to display  Imaging Review Dg Thoracic  Spine 2 View  12/27/2014   CLINICAL DATA:  81 year old male with a history of lower thoracic back pain  EXAM: THORACIC SPINE - 2 VIEW  COMPARISON:  None.  FINDINGS: Thoracic Spine:  Thoracic vertebral elements maintain normal anatomic alignment, with no evidence of subluxation. No anterolisthesis or retrolisthesis.  No acute fracture line identified.  Vertebral body heights relatively maintained. Configuration of the vertebral bodies is unchanged from the comparison plain film 12/12/2014  Disc space narrowing throughout the thoracic spine with mild endplate changes.  Osteopenia. Anterior osteophyte production present throughout the thoracic spine.  Unremarkable appearance of the visualized thorax.  IMPRESSION: No radiographic evidence of acute fracture or malalignment of the thoracic spine.  Signed,  Dulcy Fanny. Earleen Newport, DO  [facility]  [facility]   Electronically Signed   By: Corrie Mckusick D.O.   On: 12/27/2014 16:25   Dg Lumbar Spine Complete  12/27/2014   CLINICAL DATA:  Acute lower back pain after falling at home. Initial encounter.  EXAM: LUMBAR SPINE - COMPLETE 4+ VIEW  COMPARISON:  CT scan of August 09, 2012.  FINDINGS: Mild levoscoliosis of lumbar spine is noted. Mild wedge compression deformity of L4 vertebral body is noted which is unchanged compared to prior exam and consistent with old fracture. However, new mild wedge compression deformity of L2 vertebral body is noted concerning for fracture of indeterminate age. Severe degenerative disc disease is noted at L5-S1. Degenerative change posterior facet joints is seen at L5-S1. Atherosclerotic calcifications of abdominal aorta are noted.  IMPRESSION: New mild wedge compression deformity of L2 vertebral body is noted concerning for fracture of indeterminate age. MRI may be performed further evaluation.   Electronically Signed   By: Marijo Conception, M.D.   On: 12/27/2014 16:27   Ct Head Wo  Contrast  12/27/2014   CLINICAL DATA:  Questionable fall. History of right-sided deficits from prior stroke.  EXAM: CT HEAD WITHOUT CONTRAST  CT CERVICAL SPINE WITHOUT CONTRAST  TECHNIQUE: Multidetector CT imaging of the head and cervical spine was performed following the standard protocol without intravenous contrast. Multiplanar CT image reconstructions of the cervical spine were also generated.  COMPARISON:  Head CT - 12/12/2014; CTA head and neck- 06/28/2014  FINDINGS: CT HEAD FINDINGS  Advanced atrophy with diffuse sulcal prominence. Grossly unchanged extensive periventricular hypodensities compatible with microvascular ischemic disease. Old lacunar infarct within the left midbrain (image 11, series 4). Given extensive background parenchymal abnormalities, there is no CT evidence of superimposed acute large territory infarct. No intraparenchymal or extra-axial mass or hemorrhage. Unchanged size and configuration of the ventricles and basilar cisterns. No midline shift. Intracranial atherosclerosis. There is underpneumatization of the right frontal sinus. The remaining paranasal sinuses and mastoid air cells are normally aerated. No air-fluid levels. Debris is seen  within the right external auditory canal. Regional soft tissues appear normal. Post bilateral cataract surgery.  CT CERVICAL SPINE FINDINGS  C1 to the superior endplate of T2 is imaged.  There is mild (approximately 2 mm) of anterolisthesis of C5 upon C6. The dens is normally positioned between the lateral masses of C1. There is mild degenerative change of the atlanto dental articulation. Normal atlanto axial articulations. The bilateral facets are normally aligned.  No fracture or static subluxation of the cervical spine. Cervical vertebral body heights are preserved. Prevertebral soft tissues are normal.  There is mild-to-moderate multilevel cervical spine DDD, worse at C3-C4 and C4-C5 with disc space height loss, endplate irregularity and  sclerosis.  Atherosclerotic plaque within the bilateral carotid bulbs. No bulky cervical lymphadenopathy on this noncontrast examination.  Limited visualization of the lung apices demonstrates biapical pleural parenchymal thickening. Pleural plaques are noted within the bilateral lung apices. Note is made of a punctate (approximately 0.8 x 0.5 cm) nodule within the imaged right lung apex (image 75, series 6), grossly unchanged since CTA obtain 06/2014.  IMPRESSION:: IMPRESSION: 1. Advanced atrophy and microvascular ischemic disease without acute intracranial process. 2. No fracture or static subluxation of the cervical spine. 3. Advanced centrilobular emphysematous change within the imaged lung apices. 4. 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 5. Unchanged punctate (approximately 0.8 cm) nodule within the imaged right lung apex, grossly unchanged since 06/2014 examination. This examination documents 6 months of stability. Given risk factors for bronchogenic carcinoma, a follow-up chest CT in 3 - 18months is recommended to ensure continued stability. This recommendation follows the consensus statement: Guidelines for Management of Small Pulmonary Nodules Detected on CT Scans: A Statement from the [employer] as published in Radiology 2005; 237:395-400.   Electronically Signed   By: Sandi Mariscal M.D.   On: 12/27/2014 17:22   Ct Cervical Spine Wo Contrast  12/27/2014   CLINICAL DATA:  Questionable fall. History of right-sided deficits from prior stroke.  EXAM: CT HEAD WITHOUT CONTRAST  CT CERVICAL SPINE WITHOUT CONTRAST  TECHNIQUE: Multidetector CT imaging of the head and cervical spine was performed following the standard protocol without intravenous contrast. Multiplanar CT image reconstructions of the cervical spine were also generated.  COMPARISON:  Head CT - 12/12/2014; CTA head and neck- 06/28/2014  FINDINGS: CT HEAD FINDINGS  Advanced atrophy with diffuse sulcal  prominence. Grossly unchanged extensive periventricular hypodensities compatible with microvascular ischemic disease. Old lacunar infarct within the left midbrain (image 11, series 4). Given extensive background parenchymal abnormalities, there is no CT evidence of superimposed acute large territory infarct. No intraparenchymal or extra-axial mass or hemorrhage. Unchanged size and configuration of the ventricles and basilar cisterns. No midline shift. Intracranial atherosclerosis. There is underpneumatization of the right frontal sinus. The remaining paranasal sinuses and mastoid air cells are normally aerated. No air-fluid levels. Debris is seen within the right external auditory canal. Regional soft tissues appear normal. Post bilateral cataract surgery.  CT CERVICAL SPINE FINDINGS  C1 to the superior endplate of T2 is imaged.  There is mild (approximately 2 mm) of anterolisthesis of C5 upon C6. The dens is normally positioned between the lateral masses of C1. There is mild degenerative change of the atlanto dental articulation. Normal atlanto axial articulations. The bilateral facets are normally aligned.  No fracture or static subluxation of the cervical spine. Cervical vertebral body heights are preserved. Prevertebral soft tissues are normal.  There is mild-to-moderate multilevel cervical spine DDD, worse at C3-C4 and  C4-C5 with disc space height loss, endplate irregularity and sclerosis.  Atherosclerotic plaque within the bilateral carotid bulbs. No bulky cervical lymphadenopathy on this noncontrast examination.  Limited visualization of the lung apices demonstrates biapical pleural parenchymal thickening. Pleural plaques are noted within the bilateral lung apices. Note is made of a punctate (approximately 0.8 x 0.5 cm) nodule within the imaged right lung apex (image 75, series 6), grossly unchanged since CTA obtain 06/2014.  IMPRESSION:: IMPRESSION: 1. Advanced atrophy and microvascular ischemic disease  without acute intracranial process. 2. No fracture or static subluxation of the cervical spine. 3. Advanced centrilobular emphysematous change within the imaged lung apices. 4. 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 5. Unchanged punctate (approximately 0.8 cm) nodule within the imaged right lung apex, grossly unchanged since 06/2014 examination. This examination documents 6 months of stability. Given risk factors for bronchogenic carcinoma, a follow-up chest CT in 3 - 83months is recommended to ensure continued stability. This recommendation follows the consensus statement: Guidelines for Management of Small Pulmonary Nodules Detected on CT Scans: A Statement from the [employer] as published in Radiology 2005; 237:395-400.   Electronically Signed   By: Sandi Mariscal M.D.   On: 12/27/2014 17:22     EKG Interpretation None      MDM   Final diagnoses:  Back pain  Lumbar compression fracture, closed, initial encounter  Head injury, initial encounter  Lung nodule < 6cm on CT    Mechanical fall, without serious injury. Lumbar compression fracture, age-indeterminate, possibly injured today. Doubt retropulsion of fragment. Doubt causative factors beyond his gait disability.  Incidental persistent pulmonary nodule is stable for outpatient follow-up.  Nursing Notes Reviewed/ Care Coordinated Applicable Imaging Reviewed Interpretation of Laboratory Data incorporated into ED treatment  The patient appears reasonably screened and/or stabilized for discharge and I doubt any other medical condition or other [facility] requiring further screening, evaluation, or treatment in the ED at this time prior to discharge.  Plan: Home Medications- Ultracet; Home Treatments- rest; return here if the recommended treatment, does not improve the symptoms; Recommended follow up- PCP 1 week. Repeat CT 3-6 mos.     Daleen Bo, MD 12/27/14 [phone]

## 2014-12-27 NOTE — ED Notes (Signed)
Wentz at bedside.

## 2014-12-27 NOTE — Progress Notes (Signed)
CSW met with pt at bedside. Wife was present. Patient confirms that he presents to [facility] due to fall. He also confirms that he lives at home in [place] with his wife.  Wife informed CSW the pt has a caregiver 24/7. She states today when the pt was going to the bathroom his caregiver stated to inform when he was ready to get up. However, wife states that pt did not inform the caregiver, which resulted in him falling.  Wife states that the pt does not fall often. She states that this is the pt's x1 falling.   Wife states that the pt receives assistance with completing his ADL's.   Wife is the pt's primary support. [phone]   Willette Brace [phone] ED CSW 12/27/2014 9:45 PM

## 2014-12-27 NOTE — ED Notes (Signed)
Per EMS pt comes from home c/o lower back pain after falling when standing up from toilet.  Pt has right sided deficits from previous stroke in December.  Pt' home health aid was outside of bathroom waiting for pt to ask for help when he was finished, but pt didn't and fell.  Pt denies LOC. Pt takes Plavix. Pt is A&O.

## 2014-12-27 NOTE — ED Notes (Signed)
Pt wife refuses urinalysis stating "he does not need that; I can absolutely guarantee he does not have one of those."

## 2014-12-27 NOTE — Progress Notes (Signed)
Per chart review patient admitted to [facility] ED from 05/18 to 05/20.  Patient has 24/7 care with private duty agency [employer].  Patient also active with [facility] for PT, OT and speech therapy per chart review.

## 2014-12-28 DIAGNOSIS — Z8546 Personal history of malignant neoplasm of prostate: Secondary | ICD-10-CM | POA: Diagnosis not present

## 2014-12-28 DIAGNOSIS — R413 Other amnesia: Secondary | ICD-10-CM | POA: Diagnosis not present

## 2014-12-28 DIAGNOSIS — I69351 Hemiplegia and hemiparesis following cerebral infarction affecting right dominant side: Secondary | ICD-10-CM | POA: Diagnosis not present

## 2014-12-28 DIAGNOSIS — R1310 Dysphagia, unspecified: Secondary | ICD-10-CM | POA: Diagnosis not present

## 2014-12-28 DIAGNOSIS — I69391 Dysphagia following cerebral infarction: Secondary | ICD-10-CM | POA: Diagnosis not present

## 2014-12-28 DIAGNOSIS — I10 Essential (primary) hypertension: Secondary | ICD-10-CM | POA: Diagnosis not present

## 2014-12-31 DIAGNOSIS — R413 Other amnesia: Secondary | ICD-10-CM | POA: Diagnosis not present

## 2014-12-31 DIAGNOSIS — Z8546 Personal history of malignant neoplasm of prostate: Secondary | ICD-10-CM | POA: Diagnosis not present

## 2014-12-31 DIAGNOSIS — I10 Essential (primary) hypertension: Secondary | ICD-10-CM | POA: Diagnosis not present

## 2014-12-31 DIAGNOSIS — I69351 Hemiplegia and hemiparesis following cerebral infarction affecting right dominant side: Secondary | ICD-10-CM | POA: Diagnosis not present

## 2014-12-31 DIAGNOSIS — I69391 Dysphagia following cerebral infarction: Secondary | ICD-10-CM | POA: Diagnosis not present

## 2014-12-31 DIAGNOSIS — R1310 Dysphagia, unspecified: Secondary | ICD-10-CM | POA: Diagnosis not present

## 2015-01-01 DIAGNOSIS — Z8546 Personal history of malignant neoplasm of prostate: Secondary | ICD-10-CM | POA: Diagnosis not present

## 2015-01-01 DIAGNOSIS — R413 Other amnesia: Secondary | ICD-10-CM | POA: Diagnosis not present

## 2015-01-01 DIAGNOSIS — I69351 Hemiplegia and hemiparesis following cerebral infarction affecting right dominant side: Secondary | ICD-10-CM | POA: Diagnosis not present

## 2015-01-01 DIAGNOSIS — R1310 Dysphagia, unspecified: Secondary | ICD-10-CM | POA: Diagnosis not present

## 2015-01-01 DIAGNOSIS — I69391 Dysphagia following cerebral infarction: Secondary | ICD-10-CM | POA: Diagnosis not present

## 2015-01-01 DIAGNOSIS — I10 Essential (primary) hypertension: Secondary | ICD-10-CM | POA: Diagnosis not present

## 2015-01-01 DIAGNOSIS — R350 Frequency of micturition: Secondary | ICD-10-CM | POA: Diagnosis not present

## 2015-01-03 DIAGNOSIS — R413 Other amnesia: Secondary | ICD-10-CM | POA: Diagnosis not present

## 2015-01-03 DIAGNOSIS — I69391 Dysphagia following cerebral infarction: Secondary | ICD-10-CM | POA: Diagnosis not present

## 2015-01-03 DIAGNOSIS — I69351 Hemiplegia and hemiparesis following cerebral infarction affecting right dominant side: Secondary | ICD-10-CM | POA: Diagnosis not present

## 2015-01-03 DIAGNOSIS — R1310 Dysphagia, unspecified: Secondary | ICD-10-CM | POA: Diagnosis not present

## 2015-01-03 DIAGNOSIS — Z8546 Personal history of malignant neoplasm of prostate: Secondary | ICD-10-CM | POA: Diagnosis not present

## 2015-01-03 DIAGNOSIS — I10 Essential (primary) hypertension: Secondary | ICD-10-CM | POA: Diagnosis not present

## 2015-01-04 DIAGNOSIS — I10 Essential (primary) hypertension: Secondary | ICD-10-CM | POA: Diagnosis not present

## 2015-01-04 DIAGNOSIS — R1310 Dysphagia, unspecified: Secondary | ICD-10-CM | POA: Diagnosis not present

## 2015-01-04 DIAGNOSIS — R413 Other amnesia: Secondary | ICD-10-CM | POA: Diagnosis not present

## 2015-01-04 DIAGNOSIS — Z8546 Personal history of malignant neoplasm of prostate: Secondary | ICD-10-CM | POA: Diagnosis not present

## 2015-01-04 DIAGNOSIS — I69391 Dysphagia following cerebral infarction: Secondary | ICD-10-CM | POA: Diagnosis not present

## 2015-01-04 DIAGNOSIS — I69351 Hemiplegia and hemiparesis following cerebral infarction affecting right dominant side: Secondary | ICD-10-CM | POA: Diagnosis not present

## 2015-01-07 DIAGNOSIS — Z8546 Personal history of malignant neoplasm of prostate: Secondary | ICD-10-CM | POA: Diagnosis not present

## 2015-01-07 DIAGNOSIS — I69351 Hemiplegia and hemiparesis following cerebral infarction affecting right dominant side: Secondary | ICD-10-CM | POA: Diagnosis not present

## 2015-01-07 DIAGNOSIS — I69391 Dysphagia following cerebral infarction: Secondary | ICD-10-CM | POA: Diagnosis not present

## 2015-01-07 DIAGNOSIS — R413 Other amnesia: Secondary | ICD-10-CM | POA: Diagnosis not present

## 2015-01-07 DIAGNOSIS — I10 Essential (primary) hypertension: Secondary | ICD-10-CM | POA: Diagnosis not present

## 2015-01-07 DIAGNOSIS — R1310 Dysphagia, unspecified: Secondary | ICD-10-CM | POA: Diagnosis not present

## 2015-01-08 DIAGNOSIS — I69391 Dysphagia following cerebral infarction: Secondary | ICD-10-CM | POA: Diagnosis not present

## 2015-01-08 DIAGNOSIS — I10 Essential (primary) hypertension: Secondary | ICD-10-CM | POA: Diagnosis not present

## 2015-01-08 DIAGNOSIS — Z8546 Personal history of malignant neoplasm of prostate: Secondary | ICD-10-CM | POA: Diagnosis not present

## 2015-01-08 DIAGNOSIS — R1310 Dysphagia, unspecified: Secondary | ICD-10-CM | POA: Diagnosis not present

## 2015-01-08 DIAGNOSIS — R413 Other amnesia: Secondary | ICD-10-CM | POA: Diagnosis not present

## 2015-01-08 DIAGNOSIS — I69351 Hemiplegia and hemiparesis following cerebral infarction affecting right dominant side: Secondary | ICD-10-CM | POA: Diagnosis not present

## 2015-01-09 DIAGNOSIS — I10 Essential (primary) hypertension: Secondary | ICD-10-CM | POA: Diagnosis not present

## 2015-01-09 DIAGNOSIS — I69391 Dysphagia following cerebral infarction: Secondary | ICD-10-CM | POA: Diagnosis not present

## 2015-01-09 DIAGNOSIS — R413 Other amnesia: Secondary | ICD-10-CM | POA: Diagnosis not present

## 2015-01-09 DIAGNOSIS — R1310 Dysphagia, unspecified: Secondary | ICD-10-CM | POA: Diagnosis not present

## 2015-01-09 DIAGNOSIS — Z8546 Personal history of malignant neoplasm of prostate: Secondary | ICD-10-CM | POA: Diagnosis not present

## 2015-01-09 DIAGNOSIS — I69351 Hemiplegia and hemiparesis following cerebral infarction affecting right dominant side: Secondary | ICD-10-CM | POA: Diagnosis not present

## 2015-01-10 DIAGNOSIS — Z8546 Personal history of malignant neoplasm of prostate: Secondary | ICD-10-CM | POA: Diagnosis not present

## 2015-01-10 DIAGNOSIS — I69391 Dysphagia following cerebral infarction: Secondary | ICD-10-CM | POA: Diagnosis not present

## 2015-01-10 DIAGNOSIS — I69351 Hemiplegia and hemiparesis following cerebral infarction affecting right dominant side: Secondary | ICD-10-CM | POA: Diagnosis not present

## 2015-01-10 DIAGNOSIS — R413 Other amnesia: Secondary | ICD-10-CM | POA: Diagnosis not present

## 2015-01-10 DIAGNOSIS — R1310 Dysphagia, unspecified: Secondary | ICD-10-CM | POA: Diagnosis not present

## 2015-01-10 DIAGNOSIS — I10 Essential (primary) hypertension: Secondary | ICD-10-CM | POA: Diagnosis not present

## 2015-01-14 DIAGNOSIS — I10 Essential (primary) hypertension: Secondary | ICD-10-CM | POA: Diagnosis not present

## 2015-01-14 DIAGNOSIS — I69351 Hemiplegia and hemiparesis following cerebral infarction affecting right dominant side: Secondary | ICD-10-CM | POA: Diagnosis not present

## 2015-01-14 DIAGNOSIS — I69391 Dysphagia following cerebral infarction: Secondary | ICD-10-CM | POA: Diagnosis not present

## 2015-01-14 DIAGNOSIS — R1310 Dysphagia, unspecified: Secondary | ICD-10-CM | POA: Diagnosis not present

## 2015-01-14 DIAGNOSIS — R413 Other amnesia: Secondary | ICD-10-CM | POA: Diagnosis not present

## 2015-01-17 ENCOUNTER — Other Ambulatory Visit: Payer: Self-pay | Admitting: Urology

## 2015-01-17 DIAGNOSIS — C61 Malignant neoplasm of prostate: Secondary | ICD-10-CM

## 2015-01-18 DIAGNOSIS — R413 Other amnesia: Secondary | ICD-10-CM | POA: Diagnosis not present

## 2015-01-18 DIAGNOSIS — I69351 Hemiplegia and hemiparesis following cerebral infarction affecting right dominant side: Secondary | ICD-10-CM | POA: Diagnosis not present

## 2015-01-18 DIAGNOSIS — D480 Neoplasm of uncertain behavior of bone and articular cartilage: Secondary | ICD-10-CM | POA: Diagnosis not present

## 2015-01-18 DIAGNOSIS — I69391 Dysphagia following cerebral infarction: Secondary | ICD-10-CM | POA: Diagnosis not present

## 2015-01-18 DIAGNOSIS — G4710 Hypersomnia, unspecified: Secondary | ICD-10-CM | POA: Diagnosis not present

## 2015-01-18 DIAGNOSIS — I10 Essential (primary) hypertension: Secondary | ICD-10-CM | POA: Diagnosis not present

## 2015-01-18 DIAGNOSIS — R1310 Dysphagia, unspecified: Secondary | ICD-10-CM | POA: Diagnosis not present

## 2015-01-22 DIAGNOSIS — R413 Other amnesia: Secondary | ICD-10-CM | POA: Diagnosis not present

## 2015-01-22 DIAGNOSIS — I10 Essential (primary) hypertension: Secondary | ICD-10-CM | POA: Diagnosis not present

## 2015-01-22 DIAGNOSIS — I69351 Hemiplegia and hemiparesis following cerebral infarction affecting right dominant side: Secondary | ICD-10-CM | POA: Diagnosis not present

## 2015-01-22 DIAGNOSIS — R1310 Dysphagia, unspecified: Secondary | ICD-10-CM | POA: Diagnosis not present

## 2015-01-22 DIAGNOSIS — I69391 Dysphagia following cerebral infarction: Secondary | ICD-10-CM | POA: Diagnosis not present

## 2015-01-24 DIAGNOSIS — I10 Essential (primary) hypertension: Secondary | ICD-10-CM | POA: Diagnosis not present

## 2015-01-24 DIAGNOSIS — L821 Other seborrheic keratosis: Secondary | ICD-10-CM | POA: Diagnosis not present

## 2015-01-24 DIAGNOSIS — R413 Other amnesia: Secondary | ICD-10-CM | POA: Diagnosis not present

## 2015-01-24 DIAGNOSIS — I69351 Hemiplegia and hemiparesis following cerebral infarction affecting right dominant side: Secondary | ICD-10-CM | POA: Diagnosis not present

## 2015-01-24 DIAGNOSIS — R1310 Dysphagia, unspecified: Secondary | ICD-10-CM | POA: Diagnosis not present

## 2015-01-24 DIAGNOSIS — I69391 Dysphagia following cerebral infarction: Secondary | ICD-10-CM | POA: Diagnosis not present

## 2015-01-24 DIAGNOSIS — D485 Neoplasm of uncertain behavior of skin: Secondary | ICD-10-CM | POA: Diagnosis not present

## 2015-01-24 DIAGNOSIS — L570 Actinic keratosis: Secondary | ICD-10-CM | POA: Diagnosis not present

## 2015-01-24 DIAGNOSIS — Z85828 Personal history of other malignant neoplasm of skin: Secondary | ICD-10-CM | POA: Diagnosis not present

## 2015-01-29 DIAGNOSIS — I69351 Hemiplegia and hemiparesis following cerebral infarction affecting right dominant side: Secondary | ICD-10-CM | POA: Diagnosis not present

## 2015-01-29 DIAGNOSIS — R1310 Dysphagia, unspecified: Secondary | ICD-10-CM | POA: Diagnosis not present

## 2015-01-29 DIAGNOSIS — I69391 Dysphagia following cerebral infarction: Secondary | ICD-10-CM | POA: Diagnosis not present

## 2015-01-29 DIAGNOSIS — R413 Other amnesia: Secondary | ICD-10-CM | POA: Diagnosis not present

## 2015-01-29 DIAGNOSIS — I10 Essential (primary) hypertension: Secondary | ICD-10-CM | POA: Diagnosis not present

## 2015-01-31 DIAGNOSIS — I69391 Dysphagia following cerebral infarction: Secondary | ICD-10-CM | POA: Diagnosis not present

## 2015-01-31 DIAGNOSIS — I10 Essential (primary) hypertension: Secondary | ICD-10-CM | POA: Diagnosis not present

## 2015-01-31 DIAGNOSIS — R1310 Dysphagia, unspecified: Secondary | ICD-10-CM | POA: Diagnosis not present

## 2015-01-31 DIAGNOSIS — I69351 Hemiplegia and hemiparesis following cerebral infarction affecting right dominant side: Secondary | ICD-10-CM | POA: Diagnosis not present

## 2015-01-31 DIAGNOSIS — R413 Other amnesia: Secondary | ICD-10-CM | POA: Diagnosis not present

## 2015-02-01 DIAGNOSIS — R413 Other amnesia: Secondary | ICD-10-CM | POA: Diagnosis not present

## 2015-02-01 DIAGNOSIS — R1310 Dysphagia, unspecified: Secondary | ICD-10-CM | POA: Diagnosis not present

## 2015-02-01 DIAGNOSIS — I10 Essential (primary) hypertension: Secondary | ICD-10-CM | POA: Diagnosis not present

## 2015-02-01 DIAGNOSIS — I69351 Hemiplegia and hemiparesis following cerebral infarction affecting right dominant side: Secondary | ICD-10-CM | POA: Diagnosis not present

## 2015-02-01 DIAGNOSIS — I69391 Dysphagia following cerebral infarction: Secondary | ICD-10-CM | POA: Diagnosis not present

## 2015-02-05 DIAGNOSIS — I69391 Dysphagia following cerebral infarction: Secondary | ICD-10-CM | POA: Diagnosis not present

## 2015-02-05 DIAGNOSIS — I69351 Hemiplegia and hemiparesis following cerebral infarction affecting right dominant side: Secondary | ICD-10-CM | POA: Diagnosis not present

## 2015-02-05 DIAGNOSIS — R413 Other amnesia: Secondary | ICD-10-CM | POA: Diagnosis not present

## 2015-02-05 DIAGNOSIS — R1310 Dysphagia, unspecified: Secondary | ICD-10-CM | POA: Diagnosis not present

## 2015-02-05 DIAGNOSIS — I10 Essential (primary) hypertension: Secondary | ICD-10-CM | POA: Diagnosis not present

## 2015-02-08 DIAGNOSIS — R1310 Dysphagia, unspecified: Secondary | ICD-10-CM | POA: Diagnosis not present

## 2015-02-08 DIAGNOSIS — I10 Essential (primary) hypertension: Secondary | ICD-10-CM | POA: Diagnosis not present

## 2015-02-08 DIAGNOSIS — I69391 Dysphagia following cerebral infarction: Secondary | ICD-10-CM | POA: Diagnosis not present

## 2015-02-08 DIAGNOSIS — R413 Other amnesia: Secondary | ICD-10-CM | POA: Diagnosis not present

## 2015-02-08 DIAGNOSIS — I69351 Hemiplegia and hemiparesis following cerebral infarction affecting right dominant side: Secondary | ICD-10-CM | POA: Diagnosis not present

## 2015-02-12 DIAGNOSIS — R1310 Dysphagia, unspecified: Secondary | ICD-10-CM | POA: Diagnosis not present

## 2015-02-12 DIAGNOSIS — R413 Other amnesia: Secondary | ICD-10-CM | POA: Diagnosis not present

## 2015-02-12 DIAGNOSIS — I10 Essential (primary) hypertension: Secondary | ICD-10-CM | POA: Diagnosis not present

## 2015-02-12 DIAGNOSIS — I69391 Dysphagia following cerebral infarction: Secondary | ICD-10-CM | POA: Diagnosis not present

## 2015-02-12 DIAGNOSIS — I69351 Hemiplegia and hemiparesis following cerebral infarction affecting right dominant side: Secondary | ICD-10-CM | POA: Diagnosis not present

## 2015-02-15 DIAGNOSIS — R413 Other amnesia: Secondary | ICD-10-CM | POA: Diagnosis not present

## 2015-02-15 DIAGNOSIS — I69391 Dysphagia following cerebral infarction: Secondary | ICD-10-CM | POA: Diagnosis not present

## 2015-02-15 DIAGNOSIS — R1310 Dysphagia, unspecified: Secondary | ICD-10-CM | POA: Diagnosis not present

## 2015-02-15 DIAGNOSIS — I10 Essential (primary) hypertension: Secondary | ICD-10-CM | POA: Diagnosis not present

## 2015-02-15 DIAGNOSIS — I69351 Hemiplegia and hemiparesis following cerebral infarction affecting right dominant side: Secondary | ICD-10-CM | POA: Diagnosis not present

## 2015-02-18 DIAGNOSIS — I69351 Hemiplegia and hemiparesis following cerebral infarction affecting right dominant side: Secondary | ICD-10-CM | POA: Diagnosis not present

## 2015-02-18 DIAGNOSIS — R1310 Dysphagia, unspecified: Secondary | ICD-10-CM | POA: Diagnosis not present

## 2015-02-18 DIAGNOSIS — I69391 Dysphagia following cerebral infarction: Secondary | ICD-10-CM | POA: Diagnosis not present

## 2015-02-18 DIAGNOSIS — R413 Other amnesia: Secondary | ICD-10-CM | POA: Diagnosis not present

## 2015-02-18 DIAGNOSIS — I10 Essential (primary) hypertension: Secondary | ICD-10-CM | POA: Diagnosis not present

## 2015-02-20 DIAGNOSIS — I10 Essential (primary) hypertension: Secondary | ICD-10-CM | POA: Diagnosis not present

## 2015-02-20 DIAGNOSIS — I69351 Hemiplegia and hemiparesis following cerebral infarction affecting right dominant side: Secondary | ICD-10-CM | POA: Diagnosis not present

## 2015-02-20 DIAGNOSIS — R1310 Dysphagia, unspecified: Secondary | ICD-10-CM | POA: Diagnosis not present

## 2015-02-20 DIAGNOSIS — I69391 Dysphagia following cerebral infarction: Secondary | ICD-10-CM | POA: Diagnosis not present

## 2015-02-20 DIAGNOSIS — R413 Other amnesia: Secondary | ICD-10-CM | POA: Diagnosis not present

## 2015-02-21 DIAGNOSIS — I10 Essential (primary) hypertension: Secondary | ICD-10-CM | POA: Diagnosis not present

## 2015-02-21 DIAGNOSIS — I69391 Dysphagia following cerebral infarction: Secondary | ICD-10-CM | POA: Diagnosis not present

## 2015-02-21 DIAGNOSIS — R413 Other amnesia: Secondary | ICD-10-CM | POA: Diagnosis not present

## 2015-02-21 DIAGNOSIS — R1310 Dysphagia, unspecified: Secondary | ICD-10-CM | POA: Diagnosis not present

## 2015-02-21 DIAGNOSIS — I69351 Hemiplegia and hemiparesis following cerebral infarction affecting right dominant side: Secondary | ICD-10-CM | POA: Diagnosis not present

## 2015-02-22 ENCOUNTER — Encounter (HOSPITAL_COMMUNITY)
Admission: RE | Admit: 2015-02-22 | Discharge: 2015-02-22 | Disposition: A | Discharge status: Home / Self Care | Payer: Medicare Other | Source: Ambulatory Visit | Attending: Urology | Admitting: Urology

## 2015-02-22 DIAGNOSIS — M545 Low back pain: Secondary | ICD-10-CM | POA: Diagnosis not present

## 2015-02-22 DIAGNOSIS — C61 Malignant neoplasm of prostate: Secondary | ICD-10-CM | POA: Diagnosis not present

## 2015-02-22 DIAGNOSIS — M79604 Pain in right leg: Secondary | ICD-10-CM | POA: Diagnosis not present

## 2015-02-22 DIAGNOSIS — M79605 Pain in left leg: Secondary | ICD-10-CM | POA: Insufficient documentation

## 2015-02-22 MED ORDER — TECHNETIUM TC 99M MEDRONATE IV KIT
25.9000 | PACK | Freq: Once | INTRAVENOUS | Status: AC | PRN
  Administered 2015-02-22: 25.9 via INTRAVENOUS

## 2015-02-25 DIAGNOSIS — F329 Major depressive disorder, single episode, unspecified: Secondary | ICD-10-CM | POA: Diagnosis not present

## 2015-02-25 DIAGNOSIS — I10 Essential (primary) hypertension: Secondary | ICD-10-CM | POA: Diagnosis not present

## 2015-02-25 DIAGNOSIS — I639 Cerebral infarction, unspecified: Secondary | ICD-10-CM | POA: Diagnosis not present

## 2015-02-25 DIAGNOSIS — G4710 Hypersomnia, unspecified: Secondary | ICD-10-CM | POA: Diagnosis not present

## 2015-02-27 DIAGNOSIS — R1310 Dysphagia, unspecified: Secondary | ICD-10-CM | POA: Diagnosis not present

## 2015-02-27 DIAGNOSIS — I69391 Dysphagia following cerebral infarction: Secondary | ICD-10-CM | POA: Diagnosis not present

## 2015-02-27 DIAGNOSIS — R413 Other amnesia: Secondary | ICD-10-CM | POA: Diagnosis not present

## 2015-02-27 DIAGNOSIS — I10 Essential (primary) hypertension: Secondary | ICD-10-CM | POA: Diagnosis not present

## 2015-02-27 DIAGNOSIS — I69351 Hemiplegia and hemiparesis following cerebral infarction affecting right dominant side: Secondary | ICD-10-CM | POA: Diagnosis not present

## 2015-02-28 DIAGNOSIS — I69351 Hemiplegia and hemiparesis following cerebral infarction affecting right dominant side: Secondary | ICD-10-CM | POA: Diagnosis not present

## 2015-02-28 DIAGNOSIS — R1310 Dysphagia, unspecified: Secondary | ICD-10-CM | POA: Diagnosis not present

## 2015-02-28 DIAGNOSIS — R413 Other amnesia: Secondary | ICD-10-CM | POA: Diagnosis not present

## 2015-02-28 DIAGNOSIS — I69391 Dysphagia following cerebral infarction: Secondary | ICD-10-CM | POA: Diagnosis not present

## 2015-02-28 DIAGNOSIS — I10 Essential (primary) hypertension: Secondary | ICD-10-CM | POA: Diagnosis not present

## 2015-03-01 DIAGNOSIS — C61 Malignant neoplasm of prostate: Secondary | ICD-10-CM | POA: Diagnosis not present

## 2015-03-01 DIAGNOSIS — N3001 Acute cystitis with hematuria: Secondary | ICD-10-CM | POA: Diagnosis not present

## 2015-03-04 DIAGNOSIS — R1310 Dysphagia, unspecified: Secondary | ICD-10-CM | POA: Diagnosis not present

## 2015-03-04 DIAGNOSIS — I10 Essential (primary) hypertension: Secondary | ICD-10-CM | POA: Diagnosis not present

## 2015-03-04 DIAGNOSIS — R413 Other amnesia: Secondary | ICD-10-CM | POA: Diagnosis not present

## 2015-03-04 DIAGNOSIS — I69351 Hemiplegia and hemiparesis following cerebral infarction affecting right dominant side: Secondary | ICD-10-CM | POA: Diagnosis not present

## 2015-03-04 DIAGNOSIS — I69391 Dysphagia following cerebral infarction: Secondary | ICD-10-CM | POA: Diagnosis not present

## 2015-03-07 DIAGNOSIS — I69391 Dysphagia following cerebral infarction: Secondary | ICD-10-CM | POA: Diagnosis not present

## 2015-03-07 DIAGNOSIS — I69351 Hemiplegia and hemiparesis following cerebral infarction affecting right dominant side: Secondary | ICD-10-CM | POA: Diagnosis not present

## 2015-03-07 DIAGNOSIS — R413 Other amnesia: Secondary | ICD-10-CM | POA: Diagnosis not present

## 2015-03-07 DIAGNOSIS — I10 Essential (primary) hypertension: Secondary | ICD-10-CM | POA: Diagnosis not present

## 2015-03-07 DIAGNOSIS — R1310 Dysphagia, unspecified: Secondary | ICD-10-CM | POA: Diagnosis not present

## 2015-03-11 DIAGNOSIS — I69351 Hemiplegia and hemiparesis following cerebral infarction affecting right dominant side: Secondary | ICD-10-CM | POA: Diagnosis not present

## 2015-03-11 DIAGNOSIS — I69391 Dysphagia following cerebral infarction: Secondary | ICD-10-CM | POA: Diagnosis not present

## 2015-03-11 DIAGNOSIS — R1310 Dysphagia, unspecified: Secondary | ICD-10-CM | POA: Diagnosis not present

## 2015-03-11 DIAGNOSIS — I10 Essential (primary) hypertension: Secondary | ICD-10-CM | POA: Diagnosis not present

## 2015-03-11 DIAGNOSIS — R413 Other amnesia: Secondary | ICD-10-CM | POA: Diagnosis not present

## 2015-03-12 ENCOUNTER — Other Ambulatory Visit: Payer: Self-pay | Admitting: Neurology

## 2015-03-12 ENCOUNTER — Telehealth: Payer: Self-pay | Admitting: Neurology

## 2015-03-12 DIAGNOSIS — I6300 Cerebral infarction due to thrombosis of unspecified precerebral artery: Secondary | ICD-10-CM

## 2015-03-12 NOTE — Telephone Encounter (Signed)
[name] with [facility] called requesting order for outpatient PT. He is being discharged from in home PT this week. She is requesting he start next week if possible. She can be reached at [phone].

## 2015-03-12 NOTE — Telephone Encounter (Signed)
Okay I will order it 

## 2015-03-12 NOTE — Telephone Encounter (Signed)
Ltt voice message for [name] to call back concerning getting orders for outpatient PT. If [name] calls back please tell her to fax form to [phone] attention Dr. Leonie Man to get the order approve and fill out

## 2015-03-14 DIAGNOSIS — I69351 Hemiplegia and hemiparesis following cerebral infarction affecting right dominant side: Secondary | ICD-10-CM | POA: Diagnosis not present

## 2015-03-14 DIAGNOSIS — I10 Essential (primary) hypertension: Secondary | ICD-10-CM | POA: Diagnosis not present

## 2015-03-14 DIAGNOSIS — R413 Other amnesia: Secondary | ICD-10-CM | POA: Diagnosis not present

## 2015-03-14 DIAGNOSIS — I69391 Dysphagia following cerebral infarction: Secondary | ICD-10-CM | POA: Diagnosis not present

## 2015-03-14 DIAGNOSIS — R1310 Dysphagia, unspecified: Secondary | ICD-10-CM | POA: Diagnosis not present

## 2015-03-19 ENCOUNTER — Other Ambulatory Visit: Payer: Self-pay

## 2015-03-19 DIAGNOSIS — I639 Cerebral infarction, unspecified: Secondary | ICD-10-CM

## 2015-03-19 NOTE — Telephone Encounter (Signed)
Lt message for Mickel Baas regarding PTs outpatient referral for therapy. Referral was sent to outpatient ambulatory referral today.

## 2015-03-21 NOTE — Telephone Encounter (Signed)
Pt has appointments set up for ambulatory referral for March 26, 2015.

## 2015-03-26 ENCOUNTER — Encounter: Payer: Self-pay | Admitting: Occupational Therapy

## 2015-03-26 ENCOUNTER — Ambulatory Visit: Payer: Medicare Other | Attending: Neurology | Admitting: Occupational Therapy

## 2015-03-26 ENCOUNTER — Ambulatory Visit: Payer: Medicare Other

## 2015-03-26 DIAGNOSIS — R5381 Other malaise: Secondary | ICD-10-CM | POA: Diagnosis not present

## 2015-03-26 DIAGNOSIS — R269 Unspecified abnormalities of gait and mobility: Secondary | ICD-10-CM

## 2015-03-26 DIAGNOSIS — G8110 Spastic hemiplegia affecting unspecified side: Secondary | ICD-10-CM | POA: Insufficient documentation

## 2015-03-26 DIAGNOSIS — Z7409 Other reduced mobility: Secondary | ICD-10-CM | POA: Diagnosis not present

## 2015-03-26 DIAGNOSIS — R2681 Unsteadiness on feet: Secondary | ICD-10-CM

## 2015-03-26 DIAGNOSIS — R531 Weakness: Secondary | ICD-10-CM

## 2015-03-26 DIAGNOSIS — M25511 Pain in right shoulder: Secondary | ICD-10-CM | POA: Insufficient documentation

## 2015-03-26 DIAGNOSIS — R4189 Other symptoms and signs involving cognitive functions and awareness: Secondary | ICD-10-CM | POA: Diagnosis not present

## 2015-03-26 DIAGNOSIS — M6289 Other specified disorders of muscle: Secondary | ICD-10-CM | POA: Diagnosis not present

## 2015-03-26 DIAGNOSIS — M79641 Pain in right hand: Secondary | ICD-10-CM | POA: Insufficient documentation

## 2015-03-26 DIAGNOSIS — IMO0001 Reserved for inherently not codable concepts without codable children: Secondary | ICD-10-CM

## 2015-03-26 DIAGNOSIS — Z789 Other specified health status: Secondary | ICD-10-CM

## 2015-03-26 NOTE — Therapy (Signed)
[facility] [address] [place], [place], [address] Phone: [phone]   Fax:  [phone]  Occupational Therapy Evaluation  Patient Details  Name: [name] MRN: [id number] Date of Birth: 11/23/1925 Referring Provider:  Garvin Fila, MD  Encounter Date: 03/26/2015      OT End of Session - 03/26/15 1744    Visit Number 1   Number of Visits 16   Date for OT Re-Evaluation 05/21/15   Authorization Type medicare will need g code and progress note every 10th visit   Authorization Time Period 05/21/2015   OT Start Time 1315   OT Stop Time 1400   OT Time Calculation (min) 45 min   Activity Tolerance Patient tolerated treatment well      Past Medical History  Diagnosis Date  . Hypertension   . Cancer   . Stroke     Past Surgical History  Procedure Laterality Date  . Appendectomy    . Kidney surgery      There were no vitals filed for this visit.  Visit Diagnosis:  Spastic hemiplegia affecting nondominant side - Plan: Ot plan of care cert/re-cert  Pain in joint, shoulder region, right - Plan: Ot plan of care cert/re-cert  Pain, hand joint, right - Plan: Ot plan of care cert/re-cert  Impaired functional mobility and activity tolerance - Plan: Ot plan of care cert/re-cert  Impaired activities of daily living - Plan: Ot plan of care cert/re-cert  Impaired cognition - Plan: Ot plan of care cert/re-cert      Subjective Assessment - 03/26/15 1328    Subjective  My fingers hurt in this splint   Patient is accompained by: Family member  wife and PCA   Pertinent History L CVA, small vessel disease and atrophy, see epic snapshot   Patient Stated Goals I just want to get better   Currently in Pain? Yes   Pain Score --  pt unable to rate due to cognitive deficts.    Pain Location Finger (Comment which one)  all in r hand   Pain Orientation Right   Pain Descriptors / Indicators --  pt unable to  describe due to cognitive deficits   Pain Type Chronic pain   Pain Frequency Intermittent   Aggravating Factors  grabbing or squeezing his hand, hand splint   Pain Relieving Factors taking splint off, avoiding grabbing           [facility] OT Assessment - 03/26/15 0001    Assessment   Diagnosis L CVA   Onset Date 06/28/15   Prior Therapy inpt rehab, SNF and then HHPT, HHOT and HHST   Precautions   Precautions None   Restrictions   Weight Bearing Restrictions No   Balance Screen   Has the patient fallen in the past 6 months Yes   How many times? [place] expects to be discharged to: Private residence   Living Arrangements Spouse/significant other  also PCA 24/7   Available Help at Discharge Available 24 hours/day   [place] Two level  pt and wife live on first floor only   Runner, broadcasting/film/video Handicapped height  uses 3 in 1 on top of toilet   Additional Comments Pt has zero entry  shower and shower seat with grab bars and toilet has grab bars as well.   Prior Function   Level of Independence Independent   Vocation Retired  ADL   Eating/Feeding Minimal assistance   Grooming Supervision/safety   Upper Body Bathing Maximal assistance   Lower Body Bathing + 1 Total assistance   Upper Body Dressing + 1 Total assistance   Lower Body Dressing +1 Total aassistance   Toilet Tranfer Maximal assistance   Toileting - Clothing Manipulation + 1 Total assistance   Where Assessed - Toileting Clothing Manipulationn + 1 Total assistance   Tub/Shower Transfer + 1 Total assistance   IADL   Shopping Completely unable to shop   Light Housekeeping Does not participate in any housekeeping tasks   Meal Prep Needs to have meals prepared and served   [employer] on family or friends for transportation   Medication Management Is not capable of dispensing or managing own medication   Financial Management Dependent    Mobility   Mobility Status History of falls   Written Expression   Dominant Hand Left   Vision - History   Baseline Vision Wears glasses only for reading   Vision Assessment   Comment Vision appears intact   Activity Tolerance   Activity Tolerance Tolerates 10-20 min activity with muiltiple rests   Cognition   Overall Cognitive Status Impaired/Different from baseline  impaired memory, attention, insight/safety, problem solving   Sensation   Light Touch Appears Intact   Hot/Cold Appears Intact   Proprioception Appears Intact   Coordination   Gross Motor Movements are Fluid and Coordinated No   Fine Motor Movements are Fluid and Coordinated No   Tone   Assessment Location Right Upper Extremity   ROM / Strength   AROM / PROM / Strength AROM;PROM;Strength   AROM   Overall AROM  Deficits   Overall AROM Comments pt has some active movement for shoulder flexion, abduction, very limited elbow extension and flexion. Pt with significant contractures that impact any possible underlying volitional movement.    PROM   Overall PROM  Deficits   Overall PROM Comments Severe contractures at wrist (flexion contracture) and elbow (approximately (pt only has -70* of elbow flexion) and shoulder (shoulder flexion to 60* abduction to 70*)   Strength   Overall Strength Deficits   Overall Strength Comments unable to assess due to severe tone and contracture   Hand Function   Comment pt has no volitional movement in R hand   RUE Tone   RUE Tone Severe                           OT Short Term Goals - 03/26/15 1731    OT SHORT TERM GOAL #1   Title Pt ,family and PCA will be mod I with HEP - 04/23/2015   Status New   OT SHORT TERM GOAL #2   Title Pt will tolerate new splint to assist with spasticity and ROM per wearing schedule   Status New   OT SHORT TERM GOAL #3   Title Wife and PCA will demonstrate understanding of wear/care of splint as well as wearing schedule.    Status New    OT SHORT TERM GOAL #4   Title Pt will be supervision with simple grooming activities (vc's to intiate)   Status New   OT SHORT TERM GOAL #5   Title Pt will be max for UB bathing   Status New   Additional Short Term Goals   Additional Short Term Goals Yes   OT SHORT TERM GOAL #6   Title Pt will be mod  a for toilet transfers   Status New           OT Long Term Goals - 2015/04/08 1734    OT LONG TERM GOAL #1   Title Pt, wife and PCA will be mod I with upgraded HEP -05/21/2015   Status New   OT LONG TERM GOAL #2   Title Pt will be mod a for UB bathing   Status New   OT LONG TERM GOAL #3   Title Pt will be mod a for LB bathing   Status New   OT LONG TERM GOAL #4   Title Pt will be min a for standing balance to allow PCA to hike pants using grab bar.   Status New   OT LONG TERM GOAL #5   Title Pt will demonstrate improved PROM in RUE to ease in caregiver's ability to complete UB ADL's   Status New   Long Term Additional Goals   Additional Long Term Goals Yes   OT LONG TERM GOAL #6   Title Pt will be min a for toilet transfers   Status New   OT LONG TERM GOAL #7   Title Pt will be mod a for shower transfers               Plan - Apr 08, 2015 1738    Clinical Impression Statement Pt is an 81 year old male s/p L CVA on 12.2.2015. Pt was in inpt rehab and then transferred to SNF. Pt then returned home and has been getting [facility] therapies and has has one fall since returning home.  Pt presents today with the following deficits that impact his ability to do basic ADL tasks: hemiplegia of non dominant RUE, spasticity, contracture of R shoulder, elbow, and hand, pain in RUE, decreased balance, safety risk, impaired cognition. Pt will beneift from skilled OT to addres these deficits to maximize his ability participate in basic self care tasks and to ease burden on caregivers.    Pt will benefit from skilled therapeutic intervention in order to improve on the following deficits (Retired)  Abnormal gait;Decreased activity tolerance;Decreased balance;Decreased cognition;Decreased mobility;Decreased range of motion;Decreased safety awareness;Decreased strength;Impaired UE functional use;Impaired tone;Pain   Rehab Potential Fair   OT Frequency 2x / week   OT Duration 8 weeks   OT Treatment/Interventions Self-care/ADL training;Ultrasound;Traction;Moist Heat;Fluidtherapy;DME and/or AE instruction;Neuromuscular education;Therapeutic exercise;Functional Mobility Training;Manual Therapy;Passive range of motion;Splinting;Therapeutic activities;Balance training;Patient/family education   Plan Initiate stretching program   Consulted and Agree with Plan of Care Patient;Family member/caregiver   Family Member Consulted wife          G-Codes - April 08, 2015 1748    Functional Limitation Self care   Self Care Current Status [phone]) 100 percent impaired, limited or restricted   Self Care Goal Status (Z6010) At least 60 percent but less than 80 percent impaired, limited or restricted      Problem List Patient Active Problem List   Diagnosis Date Noted  . Sepsis 10/08/2014  . Acute encephalopathy 10/08/2014  . UTI (lower urinary tract infection) 10/08/2014  . Elevated troponin 10/08/2014  . Right hemiparesis 07/05/2014  . Dysphagia, post-stroke 07/05/2014  . Dysarthria due to recent cerebral infarction 07/05/2014  . Left pontine stroke 07/02/2014  . Embolic stroke involving left carotid artery 06/28/2014  . Stroke 06/28/2014  . CAP (community acquired pneumonia) 06/28/2014  . Normocytic anemia 06/28/2014  . Essential hypertension 06/28/2014  . Hypokalemia 06/28/2014    Quay Burow, OTR/L 04/08/15, 5:52 PM  [facility] [address] [place], [place], [address] Phone: [phone]   Fax:  [phone]

## 2015-03-27 NOTE — Therapy (Signed)
[facility] [address] [place], [place], [address] Phone: [phone]   Fax:  [phone]  Physical Therapy Evaluation  Patient Details  Name: [name] MRN: [id number] Date of Birth: [birth year] Referring Provider:  Garvin Fila, MD  Encounter Date: 03/26/2015      PT End of Session - 03/27/15 1222    Visit Number 1   Number of Visits 17   Date for PT Re-Evaluation 05/25/15   Authorization Type G-code every 10th visit   PT Start Time 1409  pt's OT visit went over in time   PT Stop Time 1444   PT Time Calculation (min) 35 min   Equipment Utilized During Treatment Gait belt   Activity Tolerance Other (comment)  cues to attend to task   Behavior During Therapy Agitated  intermittent agitation with caregiver and spouse      Past Medical History  Diagnosis Date  . Hypertension   . Cancer   . Stroke     Past Surgical History  Procedure Laterality Date  . Appendectomy    . Kidney surgery      There were no vitals filed for this visit.  Visit Diagnosis:  Abnormality of gait - Plan: PT plan of care cert/re-cert  Right sided weakness - Plan: PT plan of care cert/re-cert  Unsteadiness - Plan: PT plan of care cert/re-cert      Subjective Assessment - 03/26/15 1415    Subjective Pt, wife, and caregiver present. Pt's wife assisted in providing history 2/2 pt's impaired cognition. Pt had a CVA in 06/2014, inpatient rehab at [facility], and SNF at [facility]. Pt was able to ambulate with hemi-walker in 10/2014 ( before d/c from SNF). However, pt feels he's regressed since SNF d/c and he is unable to walk.  Pt very fatigued from medication and has spasms and was not able to always participate in [place]. Pt wears a R knee ext brace at night  to keep  knee in extension, pt aslo has a R ant. AFO.  Pt 's caregiver transfers pt into bed and car. Pt's wife stated pt will talk a lot in order to not perform exercises  during therapy.   Patient is accompained by: Family member  caregiver: [name] and pt's wife: Herbert Pun   Pertinent History HTN, hx of CA,    Patient Stated Goals I want to get back to normal.    Currently in Pain? No/denies            [facility] PT Assessment - 03/26/15 1426    Assessment   Medical Diagnosis L CVA   Onset Date/Surgical Date 06/26/14   Prior Therapy acute, SNF and HHPT   Precautions   Precautions Fall   Restrictions   Weight Bearing Restrictions No   Balance Screen   Has the patient fallen in the past 6 months Yes   How many times? 1  per pt's wife and caregiver   Has the patient had a decrease in activity level because of a fear of falling?  Yes   Is the patient reluctant to leave their home because of a fear of falling?  No   Home Ecologist residence   Living Arrangements Spouse/significant other;Other (Comment)  [name] and 24/7 caregivers   Available Help at Discharge Family;Available 24 hours/day  [place]-primary caregiver   Type of [place] entrance   Home Layout Two level;Able to live on main  level with bedroom/bathroom   Alternate Level Stairs-Number of Steps pt lives on first floor   Home Equipment Other (comment);Wheelchair - [employer];Shower Nature conservation officer, AFO   Prior Function   Level of Independence Independent   Vocation Retired   Charity fundraiser Status Impaired/Different from baseline   Area of Impairment Memory  confusion   Behaviors Verbal agitation  Intermittent bouts of agitation with spouse and caregiver   Sensation   Light Touch Appears Intact   Additional Comments Denied N/T   Coordination   Gross Motor Movements are Fluid and Coordinated No   Fine Motor Movements are Fluid and Coordinated No   Coordination and Movement Description due to R sided hemiparesis   Posture/Postural Control   Posture/Postural Control Postural limitations   Postural  Limitations Rounded Shoulders;Forward head;Flexed trunk   Tone   Assessment Location --  not assessed during eval 2/2 time constraints   ROM / Strength   AROM / PROM / Strength AROM;Strength   AROM   Overall AROM  Deficits   Overall AROM Comments R knee ext lacking 36 degrees, and L knee ext lacking 16 degrees 2/2 contratures.   Strength   Overall Strength Deficits   Overall Strength Comments 3/5, knee ext: 3+/5, knee flex: 3+/5, DF: 1/5 with ant. AFO donned   Flexibility   Soft Tissue Assessment /Muscle Length yes   Hamstrings decreased flexibility   Transfers   Transfers Sit to Stand;Stand to Sit   Sit to Stand 3: Mod assist;With armrests  to/from w/c   Sit to Stand Details Tactile cues for weight shifting;Verbal cues for sequencing;Verbal cues for technique;Visual cues/gestures for sequencing   Stand to Sit 4: Min assist;With armrests;Other (comment)  to w/c   Stand to Sit Details (indicate cue type and reason) Tactile cues for weight beaing;Verbal cues for sequencing;Verbal cues for technique;Verbal cues for precautions/safety   Stand to Sit Details cues to improve eccentric control.   Number of Reps --  x1   Ambulation/Gait   Ambulation/Gait No  unable to stand without assist   Wheelchair Mobility   Wheelchair Mobility No   Balance   Balance Assessed Yes   Static Standing Balance   Static Standing - Balance Support No upper extremity supported   Static Standing - Level of Assistance 3: Mod assist   Static Standing - Comment/# of Minutes With feet apart: pt required mod A to maintain balance for 10 seconds before requiring return to w/c.                           PT Education - 03/27/15 1222    Education provided Yes   Education Details PT duration/frequency   Person(s) Educated Patient;Spouse;Caregiver(s)   Methods Explanation   Comprehension Verbalized understanding          PT Short Term Goals - 03/27/15 1228    PT SHORT TERM GOAL #1    Title Pt will be ind. in performing with caregiver assist in order to improve strength, flexibility, and balance. Target date: 04/23/2015   Status New   PT SHORT TERM GOAL #2   Title Perform BERG, TUG, and gait speed when pt able to stand/amb. for longer durations (if appropriate). Target date: 04/23/15   Status New   PT SHORT TERM GOAL #3   Title Pt will stand with no UE and min guard for 1 minute in order to perform ADLs. Target date: 04/23/15.  Status New   PT SHORT TERM GOAL #4   Title Pt will ambulate 27' with LRAD and min A to improve functional mobiltiy. Target date: 04/23/15.   Status New           PT Long Term Goals - 04-19-15 1231    PT LONG TERM GOAL #1   Title Pt will verbalize understanding of CVA risk factors/sign/symptoms to decrease risk of another CVA. Target date: 05/21/15.   Status New   PT LONG TERM GOAL #2   Title Pt will ambulate 65' with min guard and LRAD to improve functional mobility. Target date: 05/21/15.   Status New   PT LONG TERM GOAL #3   Title Pt will reach 5" outside BOS with supervision, while standing with no UE support, to perform ADLs. Target date: 05/21/15.   Status New   PT LONG TERM GOAL #4   Title Pt will propel manual w/c 150' with L UE/L LE with supervision to improve functional mobility. Target date: 05/21/15.   Status New               Plan - April 19, 2015 1223    Clinical Impression Statement Pt is an 81y/o male presenting with R-sided weakness s/p CVA in 06/2014, decreased endurance, decreased flexibility, impaired cognition, requires assistance during all transfers, impaired balance, and inability to ambulate 2/2 weakness and B knee contractures.  pt would benefit from skilled PT to improve safety and ind. during functional mobility.   Pt will benefit from skilled therapeutic intervention in order to improve on the following deficits Abnormal gait;Decreased endurance;Decreased activity tolerance;Decreased balance;Decreased knowledge  of use of DME;Decreased strength;Impaired UE functional use;Decreased cognition;Decreased coordination;Decreased safety awareness;Impaired flexibility;Postural dysfunction;Decreased range of motion;Decreased mobility   Rehab Potential Fair   Clinical Impairments Affecting Rehab Potential impaired cognition and B knee contractures   PT Frequency 2x / week   PT Duration 8 weeks   PT Treatment/Interventions ADLs/Self Care Home Management;Neuromuscular re-education;Biofeedback;Electrical Stimulation;Therapeutic activities;Therapeutic exercise;Balance training;Manual techniques;Vestibular;Wheelchair mobility training;Orthotic Fit/Training;Functional mobility training;Patient/family education;Gait training;Stair training;DME Instruction   PT Next Visit Plan Assess LE tone, and w/c mobility. Provide strength/flexibility/balance HEP.   Consulted and Agree with Plan of Care Patient;Family member/caregiver          G-Codes - 2015/04/19 1235    Functional Assessment Tool Used Standing balance: 10 seconds with no UE support with mod A, sit<>stand transfer: min-mod A    Functional Limitation Mobility: Walking and moving around   Mobility: Walking and Moving Around Current Status [phone]) At least 80 percent but less than 100 percent impaired, limited or restricted   Mobility: Walking and Moving Around Goal Status [phone]) At least 40 percent but less than 60 percent impaired, limited or restricted       Problem List Patient Active Problem List   Diagnosis Date Noted  . Sepsis 10/08/2014  . Acute encephalopathy 10/08/2014  . UTI (lower urinary tract infection) 10/08/2014  . Elevated troponin 10/08/2014  . Right hemiparesis 07/05/2014  . Dysphagia, post-stroke 07/05/2014  . Dysarthria due to recent cerebral infarction 07/05/2014  . Left pontine stroke 07/02/2014  . Embolic stroke involving left carotid artery 06/28/2014  . Stroke 06/28/2014  . CAP (community acquired pneumonia) 06/28/2014  .  Normocytic anemia 06/28/2014  . Essential hypertension 06/28/2014  . Hypokalemia 06/28/2014    Inella Kuwahara L 04-19-2015, 12:37 PM  [facility] [address] [place], [place], [address] Phone: [phone]   Fax:  [phone]    Anderson Malta  Sabra Heck, PT,DPT 03/27/2015 12:37 PM Phone: [phone] Fax: [phone]

## 2015-04-02 ENCOUNTER — Ambulatory Visit: Payer: Medicare Other | Attending: Neurology | Admitting: Physical Therapy

## 2015-04-02 DIAGNOSIS — M25511 Pain in right shoulder: Secondary | ICD-10-CM | POA: Diagnosis not present

## 2015-04-02 DIAGNOSIS — R269 Unspecified abnormalities of gait and mobility: Secondary | ICD-10-CM | POA: Diagnosis not present

## 2015-04-02 DIAGNOSIS — Z7409 Other reduced mobility: Secondary | ICD-10-CM | POA: Insufficient documentation

## 2015-04-02 DIAGNOSIS — M6289 Other specified disorders of muscle: Secondary | ICD-10-CM | POA: Insufficient documentation

## 2015-04-02 DIAGNOSIS — R2681 Unsteadiness on feet: Secondary | ICD-10-CM | POA: Insufficient documentation

## 2015-04-02 DIAGNOSIS — G8110 Spastic hemiplegia affecting unspecified side: Secondary | ICD-10-CM | POA: Diagnosis not present

## 2015-04-02 DIAGNOSIS — R5381 Other malaise: Secondary | ICD-10-CM | POA: Insufficient documentation

## 2015-04-02 DIAGNOSIS — M79641 Pain in right hand: Secondary | ICD-10-CM | POA: Diagnosis not present

## 2015-04-02 DIAGNOSIS — R4189 Other symptoms and signs involving cognitive functions and awareness: Secondary | ICD-10-CM | POA: Insufficient documentation

## 2015-04-02 NOTE — Therapy (Signed)
[facility] [address] [place], [place], [address] Phone: [phone]   Fax:  [phone]  Physical Therapy Treatment  Patient Details  Name: [name] MRN: [id number] Date of Birth: [birth year] Referring Provider:  Gaynelle Arabian, MD  Encounter Date: 04/02/2015      PT End of Session - 04/02/15 1113    Visit Number 2   Number of Visits 17   Date for PT Re-Evaluation 05/25/15   Authorization Type G-code every 10th visit   PT Start Time 1105   PT Stop Time 1148  pt needing to use bathroom during session for apporx. 13 minutes   PT Time Calculation (min) 43 min   Equipment Utilized During Treatment Gait belt   Activity Tolerance Other (comment)  session shortened due to pt needing to use bathroom   Behavior During Therapy [facility] for tasks assessed/performed  needs redirection at times      Past Medical History  Diagnosis Date  . Hypertension   . Cancer   . Stroke     Past Surgical History  Procedure Laterality Date  . Appendectomy    . Kidney surgery      There were no vitals filed for this visit.  Visit Diagnosis:  Spastic hemiplegia affecting nondominant side      Subjective Assessment - 04/02/15 1110    Subjective "I think I need to go to the bathroom." patient states upon arrival to session.  Caregiver assisted pt to bathroom.   Patient is accompained by: Family member  caregiver and wife   Pertinent History HTN, hx of CA,    Patient Stated Goals I want to get back to normal.    Currently in Pain? No/denies                         [facility] Adult PT Treatment/Exercise - 04/02/15 1301    Transfers   Transfers Sit to Stand;Stand to Sit   Sit to Stand 3: Mod assist;With armrests   Sit to Stand Details Tactile cues for weight shifting;Tactile cues for initiation;Tactile cues for sequencing;Tactile cues for placement;Visual cues/gestures for sequencing;Verbal cues for  sequencing;Verbal cues for technique;Verbal cues for precautions/safety;Manual facilitation for weight shifting;Manual facilitation for placement;Manual facilitation for weight bearing   Sit to Stand Details (indicate cue type and reason) poor pivot on LE's, poor upright posture, pushes posteriorly   Stand to Sit 3: Mod assist;With upper extremity assist;To chair/3-in-1   Stand to Sit Details leans posteriorly, poor control of descent   Stand Pivot Transfers 3: Mod assist   Ambulation/Gait   Ambulation/Gait No   Wheelchair Mobility   Wheelchair Mobility Yes   Wheelchair Assistance 3: Mod assist;Other (comment)   Wheelchair Assistance Details Tactile cues for initiation;Tactile cues for sequencing;Tactile cues for posture;Verbal cues for sequencing;Verbal cues for technique;Verbal cues for safe use of DME/AE;Other (comment)  assist to steer w/c   Wheelchair Propulsion Left upper extremity;Left lower extremity   Wheelchair Parts Management Needs assistance   Distance 120   Comments unable to steer w/c with LLE   Knee/Hip Exercises: Stretches   Passive Hamstring Stretch Both;60 seconds;4 reps;Other (comment)  performed in sitting and supine-   Passive Hamstring Stretch Limitations see eval                PT Education - 04/02/15 1310    Education provided Yes   Education Details Importance of stretching frequently and allowing caregivers to assist in stretching,  w/c mobility   Person(s) Educated Patient;Spouse;Caregiver(s)   Methods Explanation;Demonstration   Comprehension Need further instruction          PT Short Term Goals - 03/27/15 1228    PT SHORT TERM GOAL #1   Title Pt will be ind. in performing with caregiver assist in order to improve strength, flexibility, and balance. Target date: 04/23/2015   Status New   PT SHORT TERM GOAL #2   Title Perform BERG, TUG, and gait speed when pt able to stand/amb. for longer durations (if appropriate). Target date: 04/23/15    Status New   PT SHORT TERM GOAL #3   Title Pt will stand with no UE and min guard for 1 minute in order to perform ADLs. Target date: 04/23/15.   Status New   PT SHORT TERM GOAL #4   Title Pt will ambulate 8' with LRAD and min A to improve functional mobiltiy. Target date: 04/23/15.   Status New           PT Long Term Goals - 03/27/15 1231    PT LONG TERM GOAL #1   Title Pt will verbalize understanding of CVA risk factors/sign/symptoms to decrease risk of another CVA. Target date: 05/21/15.   Status New   PT LONG TERM GOAL #2   Title Pt will ambulate 23' with min guard and LRAD to improve functional mobility. Target date: 05/21/15.   Status New   PT LONG TERM GOAL #3   Title Pt will reach 5" outside BOS with supervision, while standing with no UE support, to perform ADLs. Target date: 05/21/15.   Status New   PT LONG TERM GOAL #4   Title Pt will propel manual w/c 150' with L UE/L LE with supervision to improve functional mobility. Target date: 05/21/15.   Status New               Plan - 04/02/15 1314    Clinical Impression Statement Pt's wife and caregiver report that pt does not allow them to perform stretches at home.  They were not able to pick up R knee brace for positioning due to orthotist being closed this week.  Pt currently uses knee immobilizer at night for positioning.  Continue PT per POC.   Pt will benefit from skilled therapeutic intervention in order to improve on the following deficits Abnormal gait;Decreased endurance;Decreased activity tolerance;Decreased balance;Decreased knowledge of use of DME;Decreased strength;Impaired UE functional use;Decreased cognition;Decreased coordination;Decreased safety awareness;Impaired flexibility;Postural dysfunction;Decreased range of motion;Decreased mobility   Rehab Potential Fair   Clinical Impairments Affecting Rehab Potential impaired cognition and B knee contractures   PT Frequency 2x / week   PT Duration 8 weeks    PT Treatment/Interventions ADLs/Self Care Home Management;Neuromuscular re-education;Biofeedback;Electrical Stimulation;Therapeutic activities;Therapeutic exercise;Balance training;Manual techniques;Vestibular;Wheelchair mobility training;Orthotic Fit/Training;Functional mobility training;Patient/family education;Gait training;Stair training;DME Instruction   PT Next Visit Plan Provide strength/flexibility/balance HEP.  Sitting and supine passive hamstring stretch.   Consulted and Agree with Plan of Care Patient;Family member/caregiver        Problem List Patient Active Problem List   Diagnosis Date Noted  . Sepsis 10/08/2014  . Acute encephalopathy 10/08/2014  . UTI (lower urinary tract infection) 10/08/2014  . Elevated troponin 10/08/2014  . Right hemiparesis 07/05/2014  . Dysphagia, post-stroke 07/05/2014  . Dysarthria due to recent cerebral infarction 07/05/2014  . Left pontine stroke 07/02/2014  . Embolic stroke involving left carotid artery 06/28/2014  . Stroke 06/28/2014  . CAP (community acquired pneumonia) 06/28/2014  . Normocytic anemia  06/28/2014  . Essential hypertension 06/28/2014  . Hypokalemia 06/28/2014    Narda Bonds 04/02/2015, 1:16 PM  [facility] [address] [place], [place], [address] Phone: [phone]   Fax:  [place], [place] 04/02/2015 1:17 PM Phone: [phone] Fax: [phone]

## 2015-04-02 NOTE — Patient Instructions (Addendum)
Leg Extension (Hamstring)   Sit toward front edge of chair, with leg out straight, heel on floor, toes pointing toward body. Keeping back straight, bend forward at hip, breathing out through pursed lips. Return, breathing in. Repeat ___ times. Repeat with other leg. Do ___ sessions per day. Variation: Perform from standing position, with support.  Copyright  VHI. All rights reserved.  Flexion: Stretch - Hamstrings (Supine)

## 2015-04-04 ENCOUNTER — Ambulatory Visit: Payer: Medicare Other | Admitting: Occupational Therapy

## 2015-04-04 ENCOUNTER — Encounter: Payer: Self-pay | Admitting: Occupational Therapy

## 2015-04-04 DIAGNOSIS — G8110 Spastic hemiplegia affecting unspecified side: Secondary | ICD-10-CM

## 2015-04-04 DIAGNOSIS — R531 Weakness: Secondary | ICD-10-CM

## 2015-04-04 DIAGNOSIS — Z7409 Other reduced mobility: Secondary | ICD-10-CM

## 2015-04-04 NOTE — Therapy (Signed)
[facility] [address] [place], [place], [address] Phone: [phone]   Fax:  [phone]  Occupational Therapy Treatment  Patient Details  Name: [name] MRN: [id number] Date of Birth: [birth year] Referring Provider:  Gaynelle Arabian, MD  Encounter Date: 04/04/2015      OT End of Session - 04/04/15 1644    Visit Number 1   Number of Visits 16   Date for OT Re-Evaluation 05/21/15   Authorization Type medicare will need g code and progress note every 10th visit   Authorization Time Period 05/21/2015   Authorization - Visit Number 2   Authorization - Number of Visits 10  G-code   OT Start Time 1320   OT Stop Time 1405   OT Time Calculation (min) 45 min   Activity Tolerance No increased pain  pt limited by behavior   Behavior During Therapy --  perservates, agitated at times      Past Medical History  Diagnosis Date  . Hypertension   . Cancer   . Stroke     Past Surgical History  Procedure Laterality Date  . Appendectomy    . Kidney surgery      There were no vitals filed for this visit.  Visit Diagnosis:  Spastic hemiplegia affecting nondominant side  Right sided weakness  Impaired functional mobility and activity tolerance      Subjective Assessment - 04/04/15 1630    Subjective  Pt denied pain during therapy.  "It almost hurts"  "I'll do what you tell me to"     Patient is accompained by: Family member   Pertinent History L CVA, small vessel disease and atrophy, see epic snapshot   Patient Stated Goals I just want to get better   Currently in Pain? No/denies                      OT Treatments/Exercises (OP) - 04/04/15 0001    ADLs   Functional Mobility Functional transfers: Transferred with mod A/cues w/c>mat and mat>w/c, mod A/max cues to scoot forward, supine>sitting with min A/mod cues, sitting>supine with mod A/cues for adjustments.     Looked at fit of pt's  current splint.  Splint does not fit well due to contractures and limited wrist ROM and encourages deviation.  Pt may benefit from custom resting hand splint with wrist positioned in flex.           OT Education - 04/04/15 1640    Education provided Yes   Education Details HEP for stretching (PROM wrist, finger, elbow ext and shoulder flex and supination with caregiver assist; self stretch elbow ext as able); importance of regular stretching   Person(s) Educated Patient;Caregiver(s);Spouse   Methods Explanation;Demonstration;Tactile cues;Verbal cues;Handout   Comprehension Verbalized understanding;Returned demonstration;Verbal cues required;Tactile cues required;Need further instruction          OT Short Term Goals - 03/26/15 1731    OT SHORT TERM GOAL #1   Title Pt ,family and PCA will be mod I with HEP - 04/23/2015   Status New   OT SHORT TERM GOAL #2   Title Pt will tolerate new splint to assist with spasticity and ROM per wearing schedule   Status New   OT SHORT TERM GOAL #3   Title Wife and PCA will demonstrate understanding of wear/care of splint as well as wearing schedule.    Status New   OT SHORT TERM GOAL #4   Title Pt will be  supervision with simple grooming activities (vc's to intiate)   Status New   OT SHORT TERM GOAL #5   Title Pt will be max for UB bathing   Status New   Additional Short Term Goals   Additional Short Term Goals Yes   OT SHORT TERM GOAL #6   Title Pt will be mod a for toilet transfers   Status New           OT Long Term Goals - 03/26/15 1734    OT LONG TERM GOAL #1   Title Pt, wife and PCA will be mod I with upgraded HEP -05/21/2015   Status New   OT LONG TERM GOAL #2   Title Pt will be mod a for UB bathing   Status New   OT LONG TERM GOAL #3   Title Pt will be mod a for LB bathing   Status New   OT LONG TERM GOAL #4   Title Pt will be min a for standing balance to allow PCA to hike pants using grab bar.   Status New   OT LONG  TERM GOAL #5   Title Pt will demonstrate improved PROM in RUE to ease in caregiver's ability to complete UB ADL's   Status New   Long Term Additional Goals   Additional Long Term Goals Yes   OT LONG TERM GOAL #6   Title Pt will be min a for toilet transfers   Status New   OT LONG TERM GOAL #7   Title Pt will be mod a for shower transfers               Plan - 04/04/15 1631    Clinical Impression Statement Cognitive deficits and contractures are barriers to progress.  Pt gets agiated easily and perseverates on anticipated pain/past therapy.  Wife/caregiver verbalized understanding of education provided.   Clinical Impairments Affecting Rehab Potential cognition   Plan review HEP with pt/caregivers, consider custom resting hand splint and soft elbow splint    OT Home Exercise Plan Education provided:  04/04/15 initiated PROM HEP   Consulted and Agree with Plan of Care Family member/caregiver;Patient   Family Member Consulted wife        Problem List Patient Active Problem List   Diagnosis Date Noted  . Sepsis 10/08/2014  . Acute encephalopathy 10/08/2014  . UTI (lower urinary tract infection) 10/08/2014  . Elevated troponin 10/08/2014  . Right hemiparesis 07/05/2014  . Dysphagia, post-stroke 07/05/2014  . Dysarthria due to recent cerebral infarction 07/05/2014  . Left pontine stroke 07/02/2014  . Embolic stroke involving left carotid artery 06/28/2014  . Stroke 06/28/2014  . CAP (community acquired pneumonia) 06/28/2014  . Normocytic anemia 06/28/2014  . Essential hypertension 06/28/2014  . Hypokalemia 06/28/2014    [facility] 04/04/2015, 4:49 PM  [facility] [address] [place], [place], [address] Phone: [phone]   Fax:  [place], OTR/L 04/04/2015 4:49 PM

## 2015-04-04 NOTE — Patient Instructions (Addendum)
   1.  With wrist bent down, gentle stretch out fingers.  Hold 10sec.  Repeat 10 times, 2 times/day.      2.  PROM: Wrist  Extension   Grasp  hand and slowly bend wrist back until stretch is felt. Relax.   Hold __10__ sec.  Repeat _10___ times per set.    Do _2__ sessions per day.   3.  Supination (Passive)   Keep elbow bent at right angle and held firmly at side. Use other hand to turn forearm until palm faces upward. Hold __10__ seconds. Repeat __10_ times. Do _2___ sessions per day.  Copyright  VHI. All rights reserved.    4. Extension: Stretch - elbow (Sitting)   Position Helper: Support right arm at elbow. Motion -Helper straighten elbow fully. CAUTION: Do not force movement if painful. Hold 10 seconds. Repeat 10 times.  Do 2 sessions per day. (can do lying down with helper).  Also have Mr. Edgell perform in sitting reaching down toward the floor stretching out elbow with supervision.   Copyright  VHI. All rights reserved.  5.  Flexion: ROM (Supine)   Position (A) Helper: Hold left arm close to side of trunk. Motion (B) - Lift arm over head in line with trunk, thumb turned up and wrist/elbow straight as able. CAUTION: Do not push into shoulder joint. Do not force movement if painful. Repeat 10 times. 2 sessions per day.

## 2015-04-05 ENCOUNTER — Encounter: Payer: Self-pay | Admitting: Occupational Therapy

## 2015-04-05 ENCOUNTER — Ambulatory Visit: Payer: Medicare Other

## 2015-04-05 ENCOUNTER — Ambulatory Visit: Payer: Medicare Other | Admitting: Occupational Therapy

## 2015-04-05 DIAGNOSIS — R531 Weakness: Secondary | ICD-10-CM

## 2015-04-05 DIAGNOSIS — R2681 Unsteadiness on feet: Secondary | ICD-10-CM

## 2015-04-05 DIAGNOSIS — R269 Unspecified abnormalities of gait and mobility: Secondary | ICD-10-CM

## 2015-04-05 DIAGNOSIS — G8110 Spastic hemiplegia affecting unspecified side: Secondary | ICD-10-CM | POA: Diagnosis not present

## 2015-04-05 DIAGNOSIS — Z7409 Other reduced mobility: Secondary | ICD-10-CM

## 2015-04-05 DIAGNOSIS — M25511 Pain in right shoulder: Secondary | ICD-10-CM

## 2015-04-05 DIAGNOSIS — M79641 Pain in right hand: Secondary | ICD-10-CM

## 2015-04-05 DIAGNOSIS — R4189 Other symptoms and signs involving cognitive functions and awareness: Secondary | ICD-10-CM

## 2015-04-05 NOTE — Therapy (Signed)
[facility] [address] [place], [place], [address] Phone: [phone]   Fax:  [phone]  Occupational Therapy Treatment  Patient Details  Name: [name] MRN: [id number] Date of Birth: 1926-03-03 Referring Provider:  Gaynelle Arabian, MD  Encounter Date: 04/05/2015      OT End of Session - 04/05/15 1250    Visit Number 2   Number of Visits 16   Date for OT Re-Evaluation 05/21/15   Authorization Type medicare will need g code and progress note every 10th visit   Authorization Time Period 05/21/2015   Authorization - Visit Number 2   Authorization - Number of Visits 10   OT Start Time 4680   OT Stop Time 1231   OT Time Calculation (min) 46 min   Activity Tolerance Patient tolerated treatment well      Past Medical History  Diagnosis Date  . Hypertension   . Cancer   . Stroke     Past Surgical History  Procedure Laterality Date  . Appendectomy    . Kidney surgery      There were no vitals filed for this visit.  Visit Diagnosis:  Spastic hemiplegia affecting nondominant side  Impaired functional mobility and activity tolerance  Pain in joint, shoulder region, right  Pain, hand joint, right  Impaired cognition      Subjective Assessment - 04/05/15 1150    Patient is accompained by: Family member  wife and caregiver   Pertinent History L CVA, small vessel disease and atrophy, see epic snapshot   Patient Stated Goals I just want to get better   Currently in Pain? Yes   Pain Score --  pt unable to rate due to poor cognition   Pain Location Hand   Pain Orientation Right   Pain Descriptors / Indicators --  pt unabel to describe beyond "pressure"   Pain Type Chronic pain   Pain Onset More than a month ago   Pain Frequency Constant   Aggravating Factors  splint, grabbing or squeezing hand   Pain Relieving Factors taking splint off,                       OT  Treatments/Exercises (OP) - 04/05/15 0001    Exercises   Exercises Shoulder   Shoulder Exercises: Supine   Other Supine Exercises Caregiver had been instructed in PROM/stretching program last session however needed reinforcement.  Required min vc's initially and made some adjustments to allow improved pt tolerance. Caregiver able to return demonstrate.  Discussed importance of only stretching one joint at time, hand placement, and stretching to pt's tolerance. Pt with significant contracture in all joints therefore discussed given that and severely impaired cognitive status that the goal is not for functional use of the arm but to decrease tightness and pain as well as prevent further contracture for ease of ADL's and pt comfort.    Neurological Re-education Exercises   Other Exercises 1 Neuro re ed to address trunk alignment, sidelying to sit, transfers, learning forward..                OT Education - 04/05/15 1246    Education provided Yes   Education Details HEP for stretching of RUE   Person(s) Educated Spouse;Caregiver(s)   Methods Explanation;Demonstration;Tactile cues;Verbal cues   Comprehension Verbalized understanding;Returned demonstration  caregiver only          OT Short Term Goals - 04/05/15 1247    OT  SHORT TERM GOAL #1   Title Pt ,family and PCA will be mod I with HEP - 04/23/2015   Status Achieved   OT SHORT TERM GOAL #2   Title Pt will tolerate new splint to assist with spasticity and ROM per wearing schedule   Status On-going   OT SHORT TERM GOAL #3   Title Wife and PCA will demonstrate understanding of wear/care of splint as well as wearing schedule.    Status On-going   OT SHORT TERM GOAL #4   Title Pt will be supervision with simple grooming activities (vc's to intiate)   Status On-going   OT SHORT TERM GOAL #5   Title Pt will be max for UB bathing   Status On-going   OT SHORT TERM GOAL #6   Title Pt will be mod a for toilet transfers   Status  On-going           OT Long Term Goals - 04/05/15 1247    OT LONG TERM GOAL #1   Title Pt, wife and PCA will be mod I with upgraded HEP -05/21/2015   Status On-going   OT LONG TERM GOAL #2   Title Pt will be mod a for UB bathing   Status On-going   OT LONG TERM GOAL #3   Title Pt will be mod a for LB bathing   Status On-going   OT LONG TERM GOAL #4   Title Pt will be min a for standing balance to allow PCA to hike pants using grab bar.   Status On-going   OT LONG TERM GOAL #5   Title Pt will demonstrate improved PROM in RUE to ease in caregiver's ability to complete UB ADL's   Status On-going   OT LONG TERM GOAL #6   Title Pt will be min a for toilet transfers   Status On-going   OT LONG TERM GOAL #7   Title Pt will be mod a for shower transfers   Status On-going               Plan - 04/05/15 1248    Clinical Impression Statement Pt/caregiver progressing slowly toward goals. Pt with severe contractures in RUE and will not tolerate aggressive splinting/bracing therefore significant instruction given on HEP    Pt will benefit from skilled therapeutic intervention in order to improve on the following deficits (Retired) Abnormal gait;Decreased activity tolerance;Decreased balance;Decreased cognition;Decreased mobility;Decreased range of motion;Decreased safety awareness;Decreased strength;Impaired UE functional use;Impaired tone;Pain   Rehab Potential Fair   Clinical Impairments Affecting Rehab Potential cognition   OT Frequency 2x / week   OT Duration 8 weeks   OT Treatment/Interventions Self-care/ADL training;Ultrasound;Traction;Moist Heat;Fluidtherapy;DME and/or AE instruction;Neuromuscular education;Therapeutic exercise;Functional Mobility Training;Manual Therapy;Passive range of motion;Splinting;Therapeutic activities;Balance training;Patient/family education   Plan look at splinting options, possible soft elbow splint   OT Home Exercise Plan Education provided:   04/04/15 initiated PROM HEP, reinforced 03/05/2015   Consulted and Agree with Plan of Care Family member/caregiver;Patient   Family Member Consulted wife, caregiver        Problem List Patient Active Problem List   Diagnosis Date Noted  . Sepsis 10/08/2014  . Acute encephalopathy 10/08/2014  . UTI (lower urinary tract infection) 10/08/2014  . Elevated troponin 10/08/2014  . Right hemiparesis 07/05/2014  . Dysphagia, post-stroke 07/05/2014  . Dysarthria due to recent cerebral infarction 07/05/2014  . Left pontine stroke 07/02/2014  . Embolic stroke involving left carotid artery 06/28/2014  . Stroke 06/28/2014  . CAP (community  acquired pneumonia) 06/28/2014  . Normocytic anemia 06/28/2014  . Essential hypertension 06/28/2014  . Hypokalemia 06/28/2014    Quay Burow, OTR/L 04/05/2015, 12:51 PM  [facility] [address] [place], [place], [address] Phone: [phone]   Fax:  [phone]

## 2015-04-05 NOTE — Therapy (Signed)
[facility] [address] [place], [place], [address] Phone: [phone]   Fax:  [phone]  Physical Therapy Treatment  Patient Details  Name: [name] MRN: [id number] Date of Birth: 13-Jul-1926 Referring Provider:  Gaynelle Arabian, MD  Encounter Date: 04/05/2015      PT End of Session - 04/05/15 1214    Visit Number 3   Number of Visits 17   Date for PT Re-Evaluation 05/25/15   Authorization Type G-code every 10th visit   PT Start Time 1100   PT Stop Time 1143   PT Time Calculation (min) 43 min   Equipment Utilized During Treatment Gait belt   Activity Tolerance Patient tolerated treatment well   Behavior During Therapy --  perseverates      Past Medical History  Diagnosis Date  . Hypertension   . Cancer   . Stroke     Past Surgical History  Procedure Laterality Date  . Appendectomy    . Kidney surgery      There were no vitals filed for this visit.  Visit Diagnosis:  Unsteadiness  Abnormality of gait  Right sided weakness      Subjective Assessment - 04/05/15 1227    Subjective Pt denied falls since last visit. Pt very vocal about dislike for R UE splint, despite education from  PT and caregivers on benefits of splint.   Patient is accompained by: Family member   Pertinent History HTN, hx of CA,    Patient Stated Goals I want to get back to normal.    Currently in Pain? Yes  unable to rate   Pain Location Hand   Pain Orientation Right   Pain Descriptors / Indicators --  "it just doesn't feel right"   Pain Type Chronic pain   Pain Onset More than a month ago   Pain Frequency Constant   Aggravating Factors  splint   Pain Relieving Factors taking splint off of hand       Therex: Pt performed stretching HEP (hamstring and heel cords-with and without sheet). Cues and demonstration for technique. Please see pt instructions for details.  Neuro re-ed: Pt performed sit<>stand with L  UE assist and progressed from min A to min guard (x5 reps). Extensive cues and demonstration for technique. Please see pt instructions for details. Static standing with feet apart and lateral wt. Shifting. Cues for technique. Pt performed ant. Scooting forward in chair to stand.  Pt required seated rest breaks 2/2 fatigue.                          PT Education - 04/05/15 1214    Education provided Yes   Education Details HEP for stretching and balance.   Person(s) Educated Patient;Spouse;Caregiver(s)   Methods Explanation;Demonstration;Tactile cues;Verbal cues;Handout   Comprehension Verbalized understanding;Returned demonstration;Need further instruction          PT Short Term Goals - 04/05/15 1217    PT SHORT TERM GOAL #1   Title Pt will be ind. in performing with caregiver assist in order to improve strength, flexibility, and balance. Target date: 04/23/2015   Status On-going   PT SHORT TERM GOAL #2   Title Perform BERG, TUG, and gait speed when pt able to stand/amb. for longer durations (if appropriate). Target date: 04/23/15   Status On-going   PT SHORT TERM GOAL #3   Title Pt will stand with no UE and min guard for 1 minute  in order to perform ADLs. Target date: 04/23/15.   Status On-going   PT SHORT TERM GOAL #4   Title Pt will ambulate 35' with LRAD and min A to improve functional mobiltiy. Target date: 04/23/15.   Status On-going           PT Long Term Goals - 04/05/15 1218    PT LONG TERM GOAL #1   Title Pt will verbalize understanding of CVA risk factors/sign/symptoms to decrease risk of another CVA. Target date: 05/21/15.   Status On-going   PT LONG TERM GOAL #2   Title Pt will ambulate 58' with min guard and LRAD to improve functional mobility. Target date: 05/21/15.   Status On-going   PT LONG TERM GOAL #3   Title Pt will reach 5" outside BOS with supervision, while standing with no UE support, to perform ADLs. Target date: 05/21/15.    Status On-going   PT LONG TERM GOAL #4   Title Pt will propel manual w/c 150' with L UE/L LE with supervision to improve functional mobility. Target date: 05/21/15.   Status On-going               Plan - 04/05/15 1215    Clinical Impression Statement Pt unable to keep R foot flat during standing, due to decreased flexibilty of R heel cord. Pt required frequent seated rest reaks due to fatigue and cues to attend to task, as he would perservate on R UE splint. Pt noted to experience posterior trunk lean in standing and required cues to shift weight forward. Continue with POC.   Pt will benefit from skilled therapeutic intervention in order to improve on the following deficits Abnormal gait;Decreased endurance;Decreased activity tolerance;Decreased balance;Decreased knowledge of use of DME;Decreased strength;Impaired UE functional use;Decreased cognition;Decreased coordination;Decreased safety awareness;Impaired flexibility;Postural dysfunction;Decreased range of motion;Decreased mobility   Rehab Potential Fair   Clinical Impairments Affecting Rehab Potential impaired cognition and B knee contractures   PT Frequency 2x / week   PT Duration 8 weeks   PT Treatment/Interventions ADLs/Self Care Home Management;Neuromuscular re-education;Biofeedback;Electrical Stimulation;Therapeutic activities;Therapeutic exercise;Balance training;Manual techniques;Vestibular;Wheelchair mobility training;Orthotic Fit/Training;Functional mobility training;Patient/family education;Gait training;Stair training;DME Instruction   PT Next Visit Plan B LE adductor stretch, and B LE strengthening HEP   PT Home Exercise Plan Stretch and balance HEP   Consulted and Agree with Plan of Care Patient;Family member/caregiver        Problem List Patient Active Problem List   Diagnosis Date Noted  . Sepsis 10/08/2014  . Acute encephalopathy 10/08/2014  . UTI (lower urinary tract infection) 10/08/2014  . Elevated troponin  10/08/2014  . Right hemiparesis 07/05/2014  . Dysphagia, post-stroke 07/05/2014  . Dysarthria due to recent cerebral infarction 07/05/2014  . Left pontine stroke 07/02/2014  . Embolic stroke involving left carotid artery 06/28/2014  . Stroke 06/28/2014  . CAP (community acquired pneumonia) 06/28/2014  . Normocytic anemia 06/28/2014  . Essential hypertension 06/28/2014  . Hypokalemia 06/28/2014    Cornell Gaber L 04/05/2015, 12:28 PM  [facility] [address] [place], [place], [address] Phone: [phone]   Fax:  [phone]     Geoffry Paradise, PT,DPT 04/05/2015 12:28 PM Phone: [phone] Fax: [phone]

## 2015-04-05 NOTE — Patient Instructions (Signed)
Reviewed and slightly modified home stretching program for RUE.

## 2015-04-05 NOTE — Patient Instructions (Addendum)
HIP: Hamstrings - Short Sitting   Rest right leg on raised surface. Keep knee straight. Lift chest. Hold _1-2 minutes. Repeat with left leg on stool. _2-3__ reps per set, _2-3__ sets per day, _7__ days per week  Copyright  VHI. All rights reserved.   Perform at kitchen sink with wheelchair behind you for safety:  Feet Apart, Varied Arm Positions - Eyes Open   With eyes open, feet shoulder width apart, left hand on sink, look straight ahead at a stationary object. Hold __1-2 minutes. Repeat _3___ times per session. Do _2-3___ sessions per day.   Dorsiflexion: Stretch - Heel Cord / Gastrocnemius   Position (A) Patient: Lie or sit with right leg straight. Helper: Cup right heel. Make sure grip is firm. Motion (B) - Helper uses forearm to apply pressure to entire sole of foot, stretching foot toward shin. CAUTION: Stretch should be felt in calf. Do not allow foot to twist. Hold _30-45__ seconds. Repeat _3__ times. Do ___ sessions per day. Variation: Contract method: Resist ___ seconds. (see card G.G.-14)   Copyright  VHI. All rights reserved.

## 2015-04-09 ENCOUNTER — Ambulatory Visit: Payer: Medicare Other

## 2015-04-09 ENCOUNTER — Encounter: Payer: Self-pay | Admitting: Occupational Therapy

## 2015-04-09 ENCOUNTER — Ambulatory Visit: Payer: Medicare Other | Admitting: Occupational Therapy

## 2015-04-09 DIAGNOSIS — Z7409 Other reduced mobility: Secondary | ICD-10-CM

## 2015-04-09 DIAGNOSIS — G8110 Spastic hemiplegia affecting unspecified side: Secondary | ICD-10-CM | POA: Diagnosis not present

## 2015-04-09 DIAGNOSIS — R269 Unspecified abnormalities of gait and mobility: Secondary | ICD-10-CM

## 2015-04-09 DIAGNOSIS — R531 Weakness: Secondary | ICD-10-CM

## 2015-04-09 DIAGNOSIS — M79641 Pain in right hand: Secondary | ICD-10-CM

## 2015-04-09 NOTE — Patient Instructions (Signed)
Butterfly, Supine   Caregiver will Lie on back, feet together. Lower one knee toward floor. Hold _30__ seconds. Bring back to the middle and then bring your other knee towards the floor. Hold for 30 seconds. Repeat _3__ times per session. Do _2__ sessions per day.  Copyright  VHI. All rights reserved.   Leg Raise: Straight - Hook-Lying (Single Leg)   Lying down, keep right leg straight, with arm support, then Lift straight leg. Repeat 10__ times per set. Repeat with other leg. Do _2_ sets per session. Do _4_ sessions per week.  http://tub.exer.us/187   Copyright  VHI. All rights reserved.   HIP / KNEE: Flexion, Heel Slides - Supine   Slide right heel up toward buttocks, keeping leg in straight line. _2__ reps per set, _10__ sets per day, _4__ days per week Use towel or pillowcase under heel as needed. Perform in seated position, with left leg and towel placed on floor to reduce friction. Copyright  VHI. All rights reserved.   KNEE: Extension, Long Arc Quad (Band)  Sit up tall and straighten left leg. Hold _2__ seconds. Don't use a band. _10__ reps per set, _2__ sets per day, _4Bridging   Slowly raise buttocks from floor, keeping stomach tight. Repeat __10__ times per set. Do __1__ sets per session. Do __2__ sessions per day.  http://orth.exer.us/1096   Copyright  VHI. All rights reserved.  __ days per week  Copyright  VHI. All rights reserved.

## 2015-04-09 NOTE — Therapy (Signed)
[facility] [address] [place], [place], [address] Phone: [phone]   Fax:  [phone]  Physical Therapy Treatment  Patient Details  Name: [name] MRN: [id number] Date of Birth: 1926-04-10 Referring Provider:  Gaynelle Arabian, MD  Encounter Date: 04/09/2015      PT End of Session - 04/09/15 1259    Visit Number 4   Number of Visits 17   Date for PT Re-Evaluation 05/25/15   Authorization Type G-code every 10th visit   PT Start Time 1151  with OT   PT Stop Time 1230   PT Time Calculation (min) 39 min   Equipment Utilized During Treatment Gait belt   Activity Tolerance Patient tolerated treatment well   Behavior During Therapy [facility] for tasks assessed/performed      Past Medical History  Diagnosis Date  . Hypertension   . Cancer   . Stroke     Past Surgical History  Procedure Laterality Date  . Appendectomy    . Kidney surgery      There were no vitals filed for this visit.  Visit Diagnosis:  Right sided weakness  Abnormality of gait      Subjective Assessment - 04/09/15 1154    Subjective Pt denied falls or changes since last visit.   Patient is accompained by: --  caregiver   Pertinent History HTN, hx of CA,    Patient Stated Goals I want to get back to normal.    Currently in Pain? No/denies       Therex: Pt performed new stretching and strengthening HEP with assist and cues for technique. Please see pt instructions for details. Pt also performed seated B hip abd x5 with cues for technique.  Pt perform sq pivot transfers to/from w/c<>mat with mod A due to R-sided weakness. Cues for technique and to shift weight ant.                          PT Education - 04/09/15 1259    Education provided Yes   Education Details Stretching and strengthening HEP.   Person(s) Educated Patient;Caregiver(s)   Methods Explanation;Demonstration;Tactile cues;Verbal  cues;Handout   Comprehension Verbalized understanding;Returned demonstration;Need further instruction          PT Short Term Goals - 04/05/15 1217    PT SHORT TERM GOAL #1   Title Pt will be ind. in performing with caregiver assist in order to improve strength, flexibility, and balance. Target date: 04/23/2015   Status On-going   PT SHORT TERM GOAL #2   Title Perform BERG, TUG, and gait speed when pt able to stand/amb. for longer durations (if appropriate). Target date: 04/23/15   Status On-going   PT SHORT TERM GOAL #3   Title Pt will stand with no UE and min guard for 1 minute in order to perform ADLs. Target date: 04/23/15.   Status On-going   PT SHORT TERM GOAL #4   Title Pt will ambulate 72' with LRAD and min A to improve functional mobiltiy. Target date: 04/23/15.   Status On-going           PT Long Term Goals - 04/05/15 1218    PT LONG TERM GOAL #1   Title Pt will verbalize understanding of CVA risk factors/sign/symptoms to decrease risk of another CVA. Target date: 05/21/15.   Status On-going   PT LONG TERM GOAL #2   Title Pt will ambulate 28' with min guard  and LRAD to improve functional mobility. Target date: 05/21/15.   Status On-going   PT LONG TERM GOAL #3   Title Pt will reach 5" outside BOS with supervision, while standing with no UE support, to perform ADLs. Target date: 05/21/15.   Status On-going   PT LONG TERM GOAL #4   Title Pt will propel manual w/c 150' with L UE/L LE with supervision to improve functional mobility. Target date: 05/21/15.   Status On-going               Plan - 04/09/15 1259    Clinical Impression Statement Pt demonstrated progress, as he was able to perform new strengthening and stretching HEP without rest breaks today. PT demonstrated HEP for pt's caregiver, in order for pt to receive assist at home prn. Pt demonstrated better attention to task. Continue with POC.   Pt will benefit from skilled therapeutic intervention in order to  improve on the following deficits Abnormal gait;Decreased endurance;Decreased activity tolerance;Decreased balance;Decreased knowledge of use of DME;Decreased strength;Impaired UE functional use;Decreased cognition;Decreased coordination;Decreased safety awareness;Impaired flexibility;Postural dysfunction;Decreased range of motion;Decreased mobility   Rehab Potential Fair   Clinical Impairments Affecting Rehab Potential impaired cognition and B knee contractures   PT Frequency 2x / week   PT Duration 8 weeks   PT Treatment/Interventions ADLs/Self Care Home Management;Neuromuscular re-education;Biofeedback;Electrical Stimulation;Therapeutic activities;Therapeutic exercise;Balance training;Manual techniques;Vestibular;Wheelchair mobility training;Orthotic Fit/Training;Functional mobility training;Patient/family education;Gait training;Stair training;DME Instruction   PT Next Visit Plan Review HEP prn, cervical retraction and scap retraction to improve upright posture.    PT Home Exercise Plan Stretch and balance HEP   Consulted and Agree with Plan of Care Patient;Family member/caregiver        Problem List Patient Active Problem List   Diagnosis Date Noted  . Sepsis 10/08/2014  . Acute encephalopathy 10/08/2014  . UTI (lower urinary tract infection) 10/08/2014  . Elevated troponin 10/08/2014  . Right hemiparesis 07/05/2014  . Dysphagia, post-stroke 07/05/2014  . Dysarthria due to recent cerebral infarction 07/05/2014  . Left pontine stroke 07/02/2014  . Embolic stroke involving left carotid artery 06/28/2014  . Stroke 06/28/2014  . CAP (community acquired pneumonia) 06/28/2014  . Normocytic anemia 06/28/2014  . Essential hypertension 06/28/2014  . Hypokalemia 06/28/2014    Tahani Potier L 04/09/2015, 1:02 PM  [facility] [address] [place], [place], [address] Phone: [phone]   Fax:  [phone]     Geoffry Paradise, PT,DPT 04/09/2015 1:02 PM Phone: [phone] Fax: [phone]

## 2015-04-09 NOTE — Therapy (Signed)
[facility] [address] [place], [place], [address] Phone: [phone]   Fax:  [phone]  Occupational Therapy Treatment  Patient Details  Name: [name] MRN: [id number] Date of Birth: [birth year] Referring Provider:  Gaynelle Arabian, MD  Encounter Date: 04/09/2015      OT End of Session - 04/09/15 1247    Visit Number 4   Number of Visits 16   Date for OT Re-Evaluation 05/21/15   Authorization Type medicare will need g code and progress note every 10th visit   Authorization Time Period 05/21/2015   Authorization - Visit Number 4   Authorization - Number of Visits 10   OT Start Time 1100   OT Stop Time 1145   OT Time Calculation (min) 45 min   Activity Tolerance Patient tolerated treatment well   Behavior During Therapy [facility] for tasks assessed/performed      Past Medical History  Diagnosis Date  . Hypertension   . Cancer   . Stroke     Past Surgical History  Procedure Laterality Date  . Appendectomy    . Kidney surgery      There were no vitals filed for this visit.  Visit Diagnosis:  Spastic hemiplegia affecting nondominant side  Pain, hand joint, right  Impaired functional mobility and activity tolerance      Subjective Assessment - 04/09/15 1210    Subjective  "You are right on the edge of it"  during stretching of right wrist - patient anticipating when stretching would become painful   Patient is accompained by: --  paid caregiver   Pertinent History L CVA, small vessel disease and atrophy, see epic snapshot   Patient Stated Goals I just want to get better   Currently in Pain? No/denies   Pain Score 0-No pain                      OT Treatments/Exercises (OP) - 04/09/15 0001    ADLs   Functional Mobility Transferred from wheelchair to mat table via squat pivot and max assist.  Did best with emphasis on postural control and forward weight shift.     Exercises   Exercises Shoulder;Elbow;Wrist;Hand   Shoulder Exercises: Supine   Other Supine Exercises Provided gentle stretch to reduce humeral adduction and internal rotation.  Used breathing to help deepen the stretch for shoulder girdle   Elbow Exercises   Other elbow exercises Patient guided to roll onto right side and provided gentle stretch and active relaxation to elbow flexion.     Other elbow exercises Provided gentle stretch to encourage less forearm pronationas related to forearm and wrist relationship   Wrist Exercises   Other wrist exercises Provided gentle stretch with slight traction to align wrist better and to consider long term splinting options to prevent further deformity and to improve patients comfort,a dn reduce potential for skin breakdown.  Stretched with emphasis on aligning hand onto forearm with reduced ulnar deviation.  Stretched digits compossitely with MCP flexion to achieve DIP/PIP extension.     Neurological Re-education Exercises   Other Exercises 1 Patient has goals relating to transfer training with reduced assistance.  Patient with strong preference for backward weight shift during sit to squat.  Emphasis today on forward translation of body weight over feet.  Patient needed assistance to maintain right foot on ground during sit to squat transition.     Other Exercises 2 Worked on rolling from supine to sidelying with  emphasis on trunk rotation, and flexion.  Patient able to incorporate right shoulder into rolling activity with minimal assistance after initial demonstration thus activating more oblique muscle action to aide with rolling.     Splinting   Splinting Discussed with primary OT, and began inital template for possible dorsal wrist and hand orthosis.                  OT Education - 04/09/15 1246    Education provided Yes   Education Details Leaning forward during transfers, and maintaining feet on ground.   Person(s) Educated Associate Professor)    Methods Explanation;Demonstration;Verbal cues   Comprehension Verbal cues required;Tactile cues required          OT Short Term Goals - 04/05/15 1247    OT SHORT TERM GOAL #1   Title Pt ,family and PCA will be mod I with HEP - 04/23/2015   Status Achieved   OT SHORT TERM GOAL #2   Title Pt will tolerate new splint to assist with spasticity and ROM per wearing schedule   Status On-going   OT SHORT TERM GOAL #3   Title Wife and PCA will demonstrate understanding of wear/care of splint as well as wearing schedule.    Status On-going   OT SHORT TERM GOAL #4   Title Pt will be supervision with simple grooming activities (vc's to intiate)   Status On-going   OT SHORT TERM GOAL #5   Title Pt will be max for UB bathing   Status On-going   OT SHORT TERM GOAL #6   Title Pt will be mod a for toilet transfers   Status On-going           OT Long Term Goals - 04/05/15 1247    OT LONG TERM GOAL #1   Title Pt, wife and PCA will be mod I with upgraded HEP -05/21/2015   Status On-going   OT LONG TERM GOAL #2   Title Pt will be mod a for UB bathing   Status On-going   OT LONG TERM GOAL #3   Title Pt will be mod a for LB bathing   Status On-going   OT LONG TERM GOAL #4   Title Pt will be min a for standing balance to allow PCA to hike pants using grab bar.   Status On-going   OT LONG TERM GOAL #5   Title Pt will demonstrate improved PROM in RUE to ease in caregiver's ability to complete UB ADL's   Status On-going   OT LONG TERM GOAL #6   Title Pt will be min a for toilet transfers   Status On-going   OT LONG TERM GOAL #7   Title Pt will be mod a for shower transfers   Status On-going               Plan - 04/09/15 1247    Clinical Impression Statement Patient / caregiver progressing toward goals.  Patient with significant cognitive impairment, yet able to participate and follow instructions during OT session.    Pt will benefit from skilled therapeutic intervention in  order to improve on the following deficits (Retired) Abnormal gait;Decreased activity tolerance;Decreased balance;Decreased cognition;Decreased mobility;Decreased range of motion;Decreased safety awareness;Decreased strength;Impaired UE functional use;Impaired tone;Pain   Rehab Potential Fair   Clinical Impairments Affecting Rehab Potential cognition   OT Frequency 2x / week   OT Duration 8 weeks   OT Treatment/Interventions Self-care/ADL training;Ultrasound;Traction;Moist Heat;Fluidtherapy;DME and/or AE instruction;Neuromuscular education;Therapeutic exercise;Functional Mobility Training;Manual  Therapy;Passive range of motion;Splinting;Therapeutic activities;Balance training;Patient/family education   Plan fabricate dorsal wrist and hand splint, transfers weight shift forward   OT Home Exercise Plan Education provided:  04/04/15 initiated PROM HEP, reinforced 03/05/2015   Consulted and Agree with Plan of Care Family member/caregiver;Patient        Problem List Patient Active Problem List   Diagnosis Date Noted  . Sepsis 10/08/2014  . Acute encephalopathy 10/08/2014  . UTI (lower urinary tract infection) 10/08/2014  . Elevated troponin 10/08/2014  . Right hemiparesis 07/05/2014  . Dysphagia, post-stroke 07/05/2014  . Dysarthria due to recent cerebral infarction 07/05/2014  . Left pontine stroke 07/02/2014  . Embolic stroke involving left carotid artery 06/28/2014  . Stroke 06/28/2014  . CAP (community acquired pneumonia) 06/28/2014  . Normocytic anemia 06/28/2014  . Essential hypertension 06/28/2014  . Hypokalemia 06/28/2014    Mariah Milling, OTR/L 04/09/2015, 12:52 PM  [facility] [address] [place], [place], [address] Phone: [phone]   Fax:  [phone]

## 2015-04-12 ENCOUNTER — Ambulatory Visit: Payer: Medicare Other

## 2015-04-12 ENCOUNTER — Ambulatory Visit: Payer: Medicare Other | Admitting: Occupational Therapy

## 2015-04-12 DIAGNOSIS — M79641 Pain in right hand: Secondary | ICD-10-CM

## 2015-04-12 DIAGNOSIS — R2681 Unsteadiness on feet: Secondary | ICD-10-CM

## 2015-04-12 DIAGNOSIS — R4189 Other symptoms and signs involving cognitive functions and awareness: Secondary | ICD-10-CM

## 2015-04-12 DIAGNOSIS — R269 Unspecified abnormalities of gait and mobility: Secondary | ICD-10-CM

## 2015-04-12 DIAGNOSIS — G8110 Spastic hemiplegia affecting unspecified side: Secondary | ICD-10-CM | POA: Diagnosis not present

## 2015-04-12 DIAGNOSIS — R531 Weakness: Secondary | ICD-10-CM

## 2015-04-12 DIAGNOSIS — M25511 Pain in right shoulder: Secondary | ICD-10-CM

## 2015-04-12 DIAGNOSIS — Z7409 Other reduced mobility: Secondary | ICD-10-CM

## 2015-04-12 NOTE — Patient Instructions (Signed)
[name], PT. [employer]  Axial Extension (Chin Tuck)   Gently pull chin in while lengthening back of neck, caregiver can place hand on back of his neck to encourage pt to push head into hand. Hold __2__ seconds while counting out loud. Repeat _10___ times. Do _2-3___ sessions per day.  http://gt2.exer.us/553   Copyright  VHI. All rights reserved.   Flexibility: Upper Trapezius Stretch   Caregiver will gently grasp right side of head and gently apply pressure to bring patient's left ear to left shoulder. Tilt head away until a gentle stretch is felt. Hold _30___ seconds. Repeat __3__ times per set. Do __1__ sets per session. Do _2-3___ sessions per day.  http://orth.exer.us/340   Copyright  VHI. All rights reserved.    Scapular Retraction: Elbow Flexion (Seated)   With elbows bent to 90, pinch shoulder blades together, keeping elbows bent. Repeat _10___ times per set. Do __3__ sets per session. Do __1__ sessions per day.  http://orth.exer.us/948   Copyright  VHI. All rights reserved.   Take 30 seconds to 1 minute in between each set.

## 2015-04-12 NOTE — Therapy (Signed)
[facility] [address] [place], [place], [address] Phone: [phone]   Fax:  [phone]  Occupational Therapy Treatment  Patient Details  Name: [name] MRN: [id number] Date of Birth: 02/18/1926 Referring Provider:  Gaynelle Arabian, MD  Encounter Date: 04/12/2015      OT End of Session - 04/12/15 1306    Visit Number 5   Number of Visits 16   Date for OT Re-Evaluation 05/21/15   Authorization Type medicare will need g code and progress note every 10th visit   Authorization Time Period 05/21/2015   Authorization - Visit Number 5   Authorization - Number of Visits 10   OT Start Time 1108   OT Stop Time 1144   OT Time Calculation (min) 36 min   Activity Tolerance Patient tolerated treatment well   Behavior During Therapy [facility] for tasks assessed/performed      Past Medical History  Diagnosis Date  . Hypertension   . Cancer   . Stroke     Past Surgical History  Procedure Laterality Date  . Appendectomy    . Kidney surgery      There were no vitals filed for this visit.  Visit Diagnosis:  Pain, hand joint, right  Impaired functional mobility and activity tolerance  Pain in joint, shoulder region, right  Impaired cognition  Right sided weakness      Subjective Assessment - 04/12/15 1301    Patient is accompained by: --  caregiver   Patient Stated Goals I just want to get better   Currently in Pain? No/denies       Treatment: Gentle P/ROM stretching to right elbow , wrist and forearm. Pt was issued a beanbag elbow splint for increased extension at elbow, caregiver was instructed in application.She returned demonstration. Therapist to explore options for splinting right hand and wrist.                         OT Short Term Goals - 04/05/15 1247    OT SHORT TERM GOAL #1   Title Pt ,family and PCA will be mod I with HEP - 04/23/2015   Status Achieved   OT SHORT  TERM GOAL #2   Title Pt will tolerate new splint to assist with spasticity and ROM per wearing schedule   Status On-going   OT SHORT TERM GOAL #3   Title Wife and PCA will demonstrate understanding of wear/care of splint as well as wearing schedule.    Status On-going   OT SHORT TERM GOAL #4   Title Pt will be supervision with simple grooming activities (vc's to intiate)   Status On-going   OT SHORT TERM GOAL #5   Title Pt will be max for UB bathing   Status On-going   OT SHORT TERM GOAL #6   Title Pt will be mod a for toilet transfers   Status On-going           OT Long Term Goals - 04/05/15 1247    OT LONG TERM GOAL #1   Title Pt, wife and PCA will be mod I with upgraded HEP -05/21/2015   Status On-going   OT LONG TERM GOAL #2   Title Pt will be mod a for UB bathing   Status On-going   OT LONG TERM GOAL #3   Title Pt will be mod a for LB bathing   Status On-going   OT LONG TERM GOAL #4  Title Pt will be min a for standing balance to allow PCA to hike pants using grab bar.   Status On-going   OT LONG TERM GOAL #5   Title Pt will demonstrate improved PROM in RUE to ease in caregiver's ability to complete UB ADL's   Status On-going   OT LONG TERM GOAL #6   Title Pt will be min a for toilet transfers   Status On-going   OT LONG TERM GOAL #7   Title Pt will be mod a for shower transfers   Status On-going               Plan - 04/12/15 1304    Clinical Impression Statement Pt is progressing towards goals, pt's caregiver verbalizes understanding of education.   Pt will benefit from skilled therapeutic intervention in order to improve on the following deficits (Retired) Abnormal gait;Decreased activity tolerance;Decreased balance;Decreased cognition;Decreased mobility;Decreased range of motion;Decreased safety awareness;Decreased strength;Impaired UE functional use;Impaired tone;Pain   Rehab Potential Fair   Clinical Impairments Affecting Rehab Potential cognition    OT Frequency 2x / week   OT Duration 8 weeks   OT Treatment/Interventions Self-care/ADL training;Ultrasound;Traction;Moist Heat;Fluidtherapy;DME and/or AE instruction;Neuromuscular education;Therapeutic exercise;Functional Mobility Training;Manual Therapy;Passive range of motion;Splinting;Therapeutic activities;Balance training;Patient/family education   Plan investigate splint options for RUE, transfers/ weight shift forwards   OT Home Exercise Plan Education provided:  04/04/15 initiated PROM HEP, reinforced 03/05/2015   Consulted and Agree with Plan of Care Patient;Family member/caregiver   Family Member Consulted  caregiver        Problem List Patient Active Problem List   Diagnosis Date Noted  . Sepsis 10/08/2014  . Acute encephalopathy 10/08/2014  . UTI (lower urinary tract infection) 10/08/2014  . Elevated troponin 10/08/2014  . Right hemiparesis 07/05/2014  . Dysphagia, post-stroke 07/05/2014  . Dysarthria due to recent cerebral infarction 07/05/2014  . Left pontine stroke 07/02/2014  . Embolic stroke involving left carotid artery 06/28/2014  . Stroke 06/28/2014  . CAP (community acquired pneumonia) 06/28/2014  . Normocytic anemia 06/28/2014  . Essential hypertension 06/28/2014  . Hypokalemia 06/28/2014    RINE,KATHRYN 04/12/2015, 1:09 PM Theone Murdoch, OTR/L [phone] Phone: [phone] 1:09 PM 04/12/2015 [facility] [address] [place], [place], [address] Phone: [phone]   Fax:  [phone]

## 2015-04-12 NOTE — Therapy (Signed)
[facility] [address] [place], [place], [address] Phone: [phone]   Fax:  [phone]  Physical Therapy Treatment  Patient Details  Name: [name] MRN: [id number] Date of Birth: 1925-10-28 Referring Provider:  Gaynelle Arabian, MD  Encounter Date: 04/12/2015      PT End of Session - 04/12/15 1258    Visit Number 5   Number of Visits 17   Date for PT Re-Evaluation 05/25/15   Authorization Type G-code every 10th visit   PT Start Time 1148   PT Stop Time 1231   PT Time Calculation (min) 43 min   Equipment Utilized During Treatment Gait belt   Activity Tolerance Patient tolerated treatment well   Behavior During Therapy [facility] for tasks assessed/performed      Past Medical History  Diagnosis Date  . Hypertension   . Cancer   . Stroke     Past Surgical History  Procedure Laterality Date  . Appendectomy    . Kidney surgery      There were no vitals filed for this visit.  Visit Diagnosis:  Unsteadiness  Right sided weakness  Abnormality of gait      Subjective Assessment - 04/12/15 1151    Subjective Pt denied falls or changes. Pt brought new knee immobilzer to wear at night, pt to start wearing one hour per day, adding one hour each day until he reaches 8 hours, so pt can wear it on R LE at night.   Patient is accompained by: --  caregiver   Pertinent History HTN, hx of CA,    Patient Stated Goals I want to get back to normal.    Currently in Pain? No/denies         Therex: Seated: pt performed stretching and strengthening HEP with cues and demonstration for technique. Please see pt instructions for details.   Neuro re-ed: Pt performed sit to stand and static standing (1 minute) in // bars with 1 UE assist and min A. Cues to improve anterior weight shifting.  Pt educated pt and had pt perform upright, midline, posture when seated vs. Lateral trunk/cervical lean to the R-side.  Performed x5 reps.                        PT Education - 04/12/15 1257    Education provided Yes   Education Details Stretching/strengthening HEP. PT applied R knee immobilizer and educated pt on the importance of wearing brace prescribed to improve R knee ext.   Person(s) Educated Patient;Caregiver(s)   Methods Explanation;Demonstration;Tactile cues;Verbal cues;Handout   Comprehension Verbalized understanding;Returned demonstration;Need further instruction          PT Short Term Goals - 04/05/15 1217    PT SHORT TERM GOAL #1   Title Pt will be ind. in performing with caregiver assist in order to improve strength, flexibility, and balance. Target date: 04/23/2015   Status On-going   PT SHORT TERM GOAL #2   Title Perform BERG, TUG, and gait speed when pt able to stand/amb. for longer durations (if appropriate). Target date: 04/23/15   Status On-going   PT SHORT TERM GOAL #3   Title Pt will stand with no UE and min guard for 1 minute in order to perform ADLs. Target date: 04/23/15.   Status On-going   PT SHORT TERM GOAL #4   Title Pt will ambulate 52' with LRAD and min A to improve functional mobiltiy. Target date: 04/23/15.  Status On-going           PT Long Term Goals - 04/05/15 1218    PT LONG TERM GOAL #1   Title Pt will verbalize understanding of CVA risk factors/sign/symptoms to decrease risk of another CVA. Target date: 05/21/15.   Status On-going   PT LONG TERM GOAL #2   Title Pt will ambulate 24' with min guard and LRAD to improve functional mobility. Target date: 05/21/15.   Status On-going   PT LONG TERM GOAL #3   Title Pt will reach 5" outside BOS with supervision, while standing with no UE support, to perform ADLs. Target date: 05/21/15.   Status On-going   PT LONG TERM GOAL #4   Title Pt will propel manual w/c 150' with L UE/L LE with supervision to improve functional mobility. Target date: 05/21/15.   Status On-going                Plan - 04/12/15 1258    Clinical Impression Statement Pt demonstrated progress as he was able to participate in entire PT session without rest breaks. Pt demonstrated improved R upper trap flexibility after R upper trap stretch. Pt would continue to benefit from skilled PT to improve safety during functional mobility.   Pt will benefit from skilled therapeutic intervention in order to improve on the following deficits Abnormal gait;Decreased endurance;Decreased activity tolerance;Decreased balance;Decreased knowledge of use of DME;Decreased strength;Impaired UE functional use;Decreased cognition;Decreased coordination;Decreased safety awareness;Impaired flexibility;Postural dysfunction;Decreased range of motion;Decreased mobility   Rehab Potential Fair   Clinical Impairments Affecting Rehab Potential impaired cognition and B knee contractures   PT Frequency 2x / week   PT Duration 8 weeks   PT Treatment/Interventions ADLs/Self Care Home Management;Neuromuscular re-education;Biofeedback;Electrical Stimulation;Therapeutic activities;Therapeutic exercise;Balance training;Manual techniques;Vestibular;Wheelchair mobility training;Orthotic Fit/Training;Functional mobility training;Patient/family education;Gait training;Stair training;DME Instruction   PT Next Visit Plan Review HEP prn, dynamic standing balance and potentially gait in // bars.   PT Home Exercise Plan Stretch and balance HEP   Consulted and Agree with Plan of Care Patient;Family member/caregiver   Family Member Consulted caregiver        Problem List Patient Active Problem List   Diagnosis Date Noted  . Sepsis 10/08/2014  . Acute encephalopathy 10/08/2014  . UTI (lower urinary tract infection) 10/08/2014  . Elevated troponin 10/08/2014  . Right hemiparesis 07/05/2014  . Dysphagia, post-stroke 07/05/2014  . Dysarthria due to recent cerebral infarction 07/05/2014  . Left pontine stroke 07/02/2014  . Embolic stroke involving left  carotid artery 06/28/2014  . Stroke 06/28/2014  . CAP (community acquired pneumonia) 06/28/2014  . Normocytic anemia 06/28/2014  . Essential hypertension 06/28/2014  . Hypokalemia 06/28/2014    Miller,Jennifer L 04/12/2015, 1:05 PM  [facility] [address] [place], [place], [address] Phone: [phone]   Fax:  [phone]     Geoffry Paradise, PT,DPT 04/12/2015 1:05 PM Phone: [phone] Fax: [phone]

## 2015-04-12 NOTE — Patient Instructions (Signed)
Gently stretch right wrist and elbow as previously instructed Apply soft elbow brace for 1-2 hours at a time initially, remove if increased pain or pressure areas. If wearing brace while in w/c position brace on arm rest with a towel under hand Annye Asa You may increase wear time of splint to 2-3 hours at a time if you are not experiencing any problems.

## 2015-04-16 ENCOUNTER — Encounter: Payer: Self-pay | Admitting: Occupational Therapy

## 2015-04-16 ENCOUNTER — Ambulatory Visit: Payer: Medicare Other

## 2015-04-16 ENCOUNTER — Ambulatory Visit: Payer: Medicare Other | Admitting: Occupational Therapy

## 2015-04-16 DIAGNOSIS — M79641 Pain in right hand: Secondary | ICD-10-CM

## 2015-04-16 DIAGNOSIS — G8110 Spastic hemiplegia affecting unspecified side: Secondary | ICD-10-CM

## 2015-04-16 DIAGNOSIS — Z7409 Other reduced mobility: Secondary | ICD-10-CM

## 2015-04-16 DIAGNOSIS — R2681 Unsteadiness on feet: Secondary | ICD-10-CM

## 2015-04-16 DIAGNOSIS — Z789 Other specified health status: Secondary | ICD-10-CM

## 2015-04-16 DIAGNOSIS — R531 Weakness: Secondary | ICD-10-CM

## 2015-04-16 DIAGNOSIS — R4189 Other symptoms and signs involving cognitive functions and awareness: Secondary | ICD-10-CM

## 2015-04-16 DIAGNOSIS — IMO0001 Reserved for inherently not codable concepts without codable children: Secondary | ICD-10-CM

## 2015-04-16 DIAGNOSIS — R269 Unspecified abnormalities of gait and mobility: Secondary | ICD-10-CM

## 2015-04-16 DIAGNOSIS — M25511 Pain in right shoulder: Secondary | ICD-10-CM

## 2015-04-16 NOTE — Therapy (Signed)
[facility] [address] [place], [place], [address] Phone: [phone]   Fax:  [phone]  Physical Therapy Treatment  Patient Details  Name: [name] MRN: [id number] Date of Birth: [birth year] Referring Provider:  Gaynelle Arabian, MD  Encounter Date: 04/16/2015      PT End of Session - 04/16/15 1511    Visit Number 6   Number of Visits 17   Date for PT Re-Evaluation 05/25/15   Authorization Type G-code every 10th visit   PT Start Time 0934  pt late   PT Stop Time 1015   PT Time Calculation (min) 41 min   Equipment Utilized During Treatment Gait belt   Activity Tolerance Patient limited by fatigue   Behavior During Therapy [facility] for tasks assessed/performed      Past Medical History  Diagnosis Date  . Hypertension   . Cancer   . Stroke     Past Surgical History  Procedure Laterality Date  . Appendectomy    . Kidney surgery      There were no vitals filed for this visit.  Visit Diagnosis:  Right sided weakness  Unsteadiness  Abnormality of gait      Subjective Assessment - 04/16/15 0936    Subjective Pt denied falls since last visit. Pt's caregiver reported pt did not perform HEP this weekend with his other caregivers per pt's wife.    Patient is accompained by: --  caregiver   Pertinent History HTN, hx of CA,    Patient Stated Goals I want to get back to normal.    Currently in Pain? No/denies     Therex: Pt and PT performed/reviewed cervical/shoulder HEP, as pt reported pt did not allow weekend caregiver to perform HEP. PT educated pt on the importance of performing HEP as prescribed, pt agreeable. Please see pt instructions for details.  Neuro re-ed: In //bars with 2 PT's to guard pt and to provide tactile cues/assist to shift pt's hips in anterior direction (to obtain midline), performed with min-mod A. Performed x5 reps, with 1-2 minutes of static standing. Extensive cues to improve  anterior weight shifting during sit to stand and to improve upright posture vs. Trunk flexion during static standing. Pt required seated rest breaks after each static standing bout 2/2 fatigue. Pt unable to keep R heel on floor during standing due to decr. Flexibility in R heel cord.                      PT Education - 04/16/15 1510    Education provided Yes   Education Details Reviewed cervical/shoulder HEP.   Person(s) Educated Patient;Caregiver(s)   Methods Explanation;Demonstration;Tactile cues;Verbal cues   Comprehension Verbalized understanding;Returned demonstration;Need further instruction          PT Short Term Goals - 04/05/15 1217    PT SHORT TERM GOAL #1   Title Pt will be ind. in performing with caregiver assist in order to improve strength, flexibility, and balance. Target date: 04/23/2015   Status On-going   PT SHORT TERM GOAL #2   Title Perform BERG, TUG, and gait speed when pt able to stand/amb. for longer durations (if appropriate). Target date: 04/23/15   Status On-going   PT SHORT TERM GOAL #3   Title Pt will stand with no UE and min guard for 1 minute in order to perform ADLs. Target date: 04/23/15.   Status On-going   PT SHORT TERM GOAL #4   Title Pt  will ambulate 77' with LRAD and min A to improve functional mobiltiy. Target date: 04/23/15.   Status On-going           PT Long Term Goals - 04/05/15 1218    PT LONG TERM GOAL #1   Title Pt will verbalize understanding of CVA risk factors/sign/symptoms to decrease risk of another CVA. Target date: 05/21/15.   Status On-going   PT LONG TERM GOAL #2   Title Pt will ambulate 99' with min guard and LRAD to improve functional mobility. Target date: 05/21/15.   Status On-going   PT LONG TERM GOAL #3   Title Pt will reach 5" outside BOS with supervision, while standing with no UE support, to perform ADLs. Target date: 05/21/15.   Status On-going   PT LONG TERM GOAL #4   Title Pt will propel manual  w/c 150' with L UE/L LE with supervision to improve functional mobility. Target date: 05/21/15.   Status On-going               Plan - 04/16/15 1511    Clinical Impression Statement Pt required increased cues today during cervical/shoulder HEP, this could be due to pt had R UE brace donned which could have limited ability to perform HEP. Pt required extensive cues and demonstration to improve ant. weight shifting during sit to stand and during static standing. Pt required frequent seated rest breaks 2/2 fatigue during standing. Continue with POC.   Pt will benefit from skilled therapeutic intervention in order to improve on the following deficits Abnormal gait;Decreased endurance;Decreased activity tolerance;Decreased balance;Decreased knowledge of use of DME;Decreased strength;Impaired UE functional use;Decreased cognition;Decreased coordination;Decreased safety awareness;Impaired flexibility;Postural dysfunction;Decreased range of motion;Decreased mobility   Rehab Potential Fair   Clinical Impairments Affecting Rehab Potential impaired cognition and B knee contractures   PT Frequency 2x / week   PT Duration 8 weeks   PT Treatment/Interventions ADLs/Self Care Home Management;Neuromuscular re-education;Biofeedback;Electrical Stimulation;Therapeutic activities;Therapeutic exercise;Balance training;Manual techniques;Vestibular;Wheelchair mobility training;Orthotic Fit/Training;Functional mobility training;Patient/family education;Gait training;Stair training;DME Instruction   PT Next Visit Plan R heel cord stretching, static/dynamic standing balance and potentially gait in // bars.   PT Home Exercise Plan Stretch and balance HEP   Consulted and Agree with Plan of Care Patient;Family member/caregiver   Family Member Consulted caregiver        Problem List Patient Active Problem List   Diagnosis Date Noted  . Sepsis 10/08/2014  . Acute encephalopathy 10/08/2014  . UTI (lower urinary  tract infection) 10/08/2014  . Elevated troponin 10/08/2014  . Right hemiparesis 07/05/2014  . Dysphagia, post-stroke 07/05/2014  . Dysarthria due to recent cerebral infarction 07/05/2014  . Left pontine stroke 07/02/2014  . Embolic stroke involving left carotid artery 06/28/2014  . Stroke 06/28/2014  . CAP (community acquired pneumonia) 06/28/2014  . Normocytic anemia 06/28/2014  . Essential hypertension 06/28/2014  . Hypokalemia 06/28/2014    Miller,Jennifer L 04/16/2015, 3:17 PM  [facility] [address] [place], [place], [address] Phone: [phone]   Fax:  [phone]     Geoffry Paradise, PT,DPT 04/16/2015 3:17 PM Phone: [phone] Fax: [phone]

## 2015-04-16 NOTE — Therapy (Signed)
[facility] [address] [place], [place], [address] Phone: [phone]   Fax:  [phone]  Occupational Therapy Treatment  Patient Details  Name: [name] MRN: [id number] Date of Birth: 03-Feb-1926 Referring Provider:  Gaynelle Arabian, MD  Encounter Date: 04/16/2015      OT End of Session - 04/16/15 1138    Visit Number 6   Number of Visits 16   Date for OT Re-Evaluation 05/21/15   Authorization Type medicare will need g code and progress note every 10th visit   Authorization Time Period 05/21/2015   Authorization - Visit Number 6   Authorization - Number of Visits 10   OT Start Time 1017   OT Stop Time 1100   OT Time Calculation (min) 43 min   Activity Tolerance Patient tolerated treatment well      Past Medical History  Diagnosis Date  . Hypertension   . Cancer   . Stroke     Past Surgical History  Procedure Laterality Date  . Appendectomy    . Kidney surgery      There were no vitals filed for this visit.  Visit Diagnosis:  Pain, hand joint, right  Pain in joint, shoulder region, right  Impaired functional mobility and activity tolerance  Impaired cognition  Spastic hemiplegia affecting nondominant side  Impaired activities of daily living      Subjective Assessment - 04/16/15 1119    Subjective  I feel like I can trust you when you touch my arm   Pertinent History L CVA, small vessel disease and atrophy, see epic snapshot   Patient Stated Goals I just want to get better   Currently in Pain? Yes   Pain Score 5    Pain Location Head   Pain Orientation Right   Pain Descriptors / Indicators Aching;Sharp;Sore   Pain Type Chronic pain   Pain Onset More than a month ago   Pain Frequency Intermittent   Aggravating Factors  when people stretch it or pull on it.    Pain Relieving Factors gentle stretching; splint that intitally came with was d/c in process of making custom splint.                       OT Treatments/Exercises (OP) - 04/16/15 0001    Splinting   Splinting Began fabrication today of hand splint for R hand. Pt has both severe tone as well as contractures in wrist , hand and fingers, Pt with severely contractured wrist into wrist flexion as well as ulnar deviation, fingers in fleixon as well.  Began custom fabrication for dorsal splint that will prevent further wrist flexion contracture, hold wrist in less ulnar deviation and encourage finger extension with volar finger pad. Pt is highly resistant to stretching or repositioning of R hand due to pain and tightness as wel as pins and needles however is easily distracted and redirected during treatment session.  PCA states that pt is tolerating stretching program at home with her but she does not think the weekend aid is doing the program on the weekends. Strongly encouraged her to comminicate that weekend aid should come into learn how to do program.  PCA agreed to have a discussion with weekend aide and pt in agreement.                   OT Short Term Goals - 04/16/15 1135    OT SHORT TERM GOAL #1  Title Pt ,family and PCA will be mod I with HEP - 04/23/2015   Status Achieved   OT SHORT TERM GOAL #2   Title Pt will tolerate new splint to assist with spasticity and ROM per wearing schedule   Status On-going   OT SHORT TERM GOAL #3   Title Wife and PCA will demonstrate understanding of wear/care of splint as well as wearing schedule.    Status On-going   OT SHORT TERM GOAL #4   Title Pt will be supervision with simple grooming activities (vc's to intiate)   Status On-going   OT SHORT TERM GOAL #5   Title Pt will be max for UB bathing   Status On-going   OT SHORT TERM GOAL #6   Title Pt will be mod a for toilet transfers   Status On-going           OT Long Term Goals - 04/16/15 1135    OT LONG TERM GOAL #1   Title Pt, wife and PCA will be mod I with upgraded HEP -05/21/2015    Status On-going   OT LONG TERM GOAL #2   Title Pt will be mod a for UB bathing   Status On-going   OT LONG TERM GOAL #3   Title Pt will be mod a for LB bathing   Status On-going   OT LONG TERM GOAL #4   Title Pt will be min a for standing balance to allow PCA to hike pants using grab bar.   Status On-going   OT LONG TERM GOAL #5   Title Pt will demonstrate improved PROM in RUE to ease in caregiver's ability to complete UB ADL's   Status On-going   OT LONG TERM GOAL #6   Title Pt will be min a for toilet transfers   Status On-going   OT LONG TERM GOAL #7   Title Pt will be mod a for shower transfers   Status On-going               Plan - 04/16/15 1135    Clinical Impression Statement Pt is slowly progressing toward goals - PCA states pt is toleratiing stretching program with her at home.  Offered to train weekend aide as well.    Pt will benefit from skilled therapeutic intervention in order to improve on the following deficits (Retired) Abnormal gait;Decreased activity tolerance;Decreased balance;Decreased cognition;Decreased mobility;Decreased range of motion;Decreased safety awareness;Decreased strength;Impaired UE functional use;Impaired tone;Pain   Rehab Potential Fair   Clinical Impairments Affecting Rehab Potential cognition   OT Frequency 2x / week   OT Duration 8 weeks   OT Treatment/Interventions Self-care/ADL training;Ultrasound;Traction;Moist Heat;Fluidtherapy;DME and/or AE instruction;Neuromuscular education;Therapeutic exercise;Functional Mobility Training;Manual Therapy;Passive range of motion;Splinting;Therapeutic activities;Balance training;Patient/family education   Plan complete splint, educated pt and aide on wear and care of splint as well as building tolerance   OT Home Exercise Plan Education provided:  04/04/15 initiated PROM HEP, reinforced 03/05/2015   Consulted and Agree with Plan of Care Patient;Family member/caregiver   Family Member Consulted   caregiver        Problem List Patient Active Problem List   Diagnosis Date Noted  . Sepsis 10/08/2014  . Acute encephalopathy 10/08/2014  . UTI (lower urinary tract infection) 10/08/2014  . Elevated troponin 10/08/2014  . Right hemiparesis 07/05/2014  . Dysphagia, post-stroke 07/05/2014  . Dysarthria due to recent cerebral infarction 07/05/2014  . Left pontine stroke 07/02/2014  . Embolic stroke involving left carotid artery 06/28/2014  .  Stroke 06/28/2014  . CAP (community acquired pneumonia) 06/28/2014  . Normocytic anemia 06/28/2014  . Essential hypertension 06/28/2014  . Hypokalemia 06/28/2014    Quay Burow, OTR/L 04/16/2015, 11:41 AM  [facility] [address] [place], [place], [address] Phone: [phone]   Fax:  [phone]

## 2015-04-16 NOTE — Patient Instructions (Signed)
Axial Extension (Chin Tuck)   Gently pull chin in while lengthening back of neck, caregiver can place hand on back of his neck to encourage pt to push head into hand. Hold __2__ seconds while counting out loud. Repeat _10___ times. Do _2-3___ sessions per day.  http://gt2.exer.us/553   Copyright  VHI. All rights reserved.   Flexibility: Upper Trapezius Stretch   Caregiver will gently grasp right side of head and gently apply pressure to bring patient's left ear to left shoulder. Tilt head away until a gentle stretch is felt. Hold _30___ seconds. Repeat __3__ times per set. Do __1__ sets per session. Do _2-3___ sessions per day.  http://orth.exer.us/340   Copyright  VHI. All rights reserved.    Scapular Retraction: Elbow Flexion (Seated)   With elbows bent to 90, pinch shoulder blades together, keeping elbows bent. Repeat _10___ times per set. Do __3__ sets per session. Do __1__ sessions per day.  http://orth.exer.us/948   Copyright  VHI. All rights reserved.

## 2015-04-18 ENCOUNTER — Ambulatory Visit: Payer: Medicare Other | Admitting: Occupational Therapy

## 2015-04-18 ENCOUNTER — Ambulatory Visit: Payer: Medicare Other | Admitting: Physical Therapy

## 2015-04-18 ENCOUNTER — Encounter: Payer: Self-pay | Admitting: Occupational Therapy

## 2015-04-18 DIAGNOSIS — R4189 Other symptoms and signs involving cognitive functions and awareness: Secondary | ICD-10-CM

## 2015-04-18 DIAGNOSIS — M25511 Pain in right shoulder: Secondary | ICD-10-CM

## 2015-04-18 DIAGNOSIS — IMO0001 Reserved for inherently not codable concepts without codable children: Secondary | ICD-10-CM

## 2015-04-18 DIAGNOSIS — R269 Unspecified abnormalities of gait and mobility: Secondary | ICD-10-CM

## 2015-04-18 DIAGNOSIS — G8110 Spastic hemiplegia affecting unspecified side: Secondary | ICD-10-CM | POA: Diagnosis not present

## 2015-04-18 DIAGNOSIS — M79641 Pain in right hand: Secondary | ICD-10-CM

## 2015-04-18 DIAGNOSIS — Z789 Other specified health status: Secondary | ICD-10-CM

## 2015-04-18 DIAGNOSIS — Z7409 Other reduced mobility: Secondary | ICD-10-CM

## 2015-04-18 NOTE — Therapy (Signed)
[facility] [address] [place], [place], [address] Phone: [phone]   Fax:  [phone]  Physical Therapy Treatment  Patient Details  Name: [name] MRN: [id number] Date of Birth: 1926/06/08 Referring Provider:  Gaynelle Arabian, MD  Encounter Date: 04/18/2015      PT End of Session - 04/18/15 1208    Visit Number 7   Number of Visits 17   Date for PT Re-Evaluation 05/25/15   Authorization Type G-code every 10th visit   PT Start Time 0933   PT Stop Time 1016   PT Time Calculation (min) 43 min   Equipment Utilized During Treatment Gait belt   Activity Tolerance Patient limited by fatigue   Behavior During Therapy [facility] for tasks assessed/performed      Past Medical History  Diagnosis Date  . Hypertension   . Cancer   . Stroke     Past Surgical History  Procedure Laterality Date  . Appendectomy    . Kidney surgery      There were no vitals filed for this visit.  Visit Diagnosis:  Abnormality of gait      Subjective Assessment - 04/18/15 1206    Subjective "I feel like a rubberband" in standing frame on RLE.  Denies falls or changes.   Patient is accompained by: Family member  wife and caregiver   Pertinent History HTN, hx of CA,    Patient Stated Goals I want to get back to normal.    Currently in Pain? No/denies     Standing in standing frame x 6 minutes without R knee brace or AFO for RLE stretching.  Pt able to tolerated time with encouragement and distraction with conversation.  Pt needs cues for avoid posterior pushing.  In parallel bars, sit<>stand with +2 total assist (pt=50%) with pt pulling on parallel bar with L UE.  Pt needed cues and assist to lean forward for transfer, foot placement and remain forward upon upright.   Gait in parallel bars x 4 reps x 8' with +2 total assist (pt=40%) with cues to keep L UE stretched forward to promote forward lean.  Needs assist with R LE  placement and to promote extension in R knee and hip.  Poor weight shift to both sides.        PT Education - 04/18/15 1207    Education provided Yes   Education Details Purpose of standing frame and stretching   Person(s) Educated Patient   Methods Explanation;Demonstration   Comprehension Verbalized understanding;Need further instruction          PT Short Term Goals - 04/05/15 1217    PT SHORT TERM GOAL #1   Title Pt will be ind. in performing with caregiver assist in order to improve strength, flexibility, and balance. Target date: 04/23/2015   Status On-going   PT SHORT TERM GOAL #2   Title Perform BERG, TUG, and gait speed when pt able to stand/amb. for longer durations (if appropriate). Target date: 04/23/15   Status On-going   PT SHORT TERM GOAL #3   Title Pt will stand with no UE and min guard for 1 minute in order to perform ADLs. Target date: 04/23/15.   Status On-going   PT SHORT TERM GOAL #4   Title Pt will ambulate 71' with LRAD and min A to improve functional mobiltiy. Target date: 04/23/15.   Status On-going           PT Long Term Goals -  04/05/15 1218    PT LONG TERM GOAL #1   Title Pt will verbalize understanding of CVA risk factors/sign/symptoms to decrease risk of another CVA. Target date: 05/21/15.   Status On-going   PT LONG TERM GOAL #2   Title Pt will ambulate 48' with min guard and LRAD to improve functional mobility. Target date: 05/21/15.   Status On-going   PT LONG TERM GOAL #3   Title Pt will reach 5" outside BOS with supervision, while standing with no UE support, to perform ADLs. Target date: 05/21/15.   Status On-going   PT LONG TERM GOAL #4   Title Pt will propel manual w/c 150' with L UE/L LE with supervision to improve functional mobility. Target date: 05/21/15.   Status On-going               Plan - 04/18/15 1208    Clinical Impression Statement Pt tolerated standing frame today with reinforcement for purpose and distraction  with conversation.  Continues with decreased R knee extension and leans/pushes posteriorly with transfers and standing.  Continue PT per POC.   Pt will benefit from skilled therapeutic intervention in order to improve on the following deficits Abnormal gait;Decreased endurance;Decreased activity tolerance;Decreased balance;Decreased knowledge of use of DME;Decreased strength;Impaired UE functional use;Decreased cognition;Decreased coordination;Decreased safety awareness;Impaired flexibility;Postural dysfunction;Decreased range of motion;Decreased mobility   Rehab Potential Fair   Clinical Impairments Affecting Rehab Potential impaired cognition and B knee contractures   PT Frequency 2x / week   PT Duration 8 weeks   PT Treatment/Interventions ADLs/Self Care Home Management;Neuromuscular re-education;Biofeedback;Electrical Stimulation;Therapeutic activities;Therapeutic exercise;Balance training;Manual techniques;Vestibular;Wheelchair mobility training;Orthotic Fit/Training;Functional mobility training;Patient/family education;Gait training;Stair training;DME Instruction   PT Next Visit Plan standing frame for stretching, standing and gait in parallel bars.   PT Home Exercise Plan Stretch and balance HEP   Consulted and Agree with Plan of Care Patient;Family member/caregiver   Family Member Consulted caregiver and wife        Problem List Patient Active Problem List   Diagnosis Date Noted  . Sepsis 10/08/2014  . Acute encephalopathy 10/08/2014  . UTI (lower urinary tract infection) 10/08/2014  . Elevated troponin 10/08/2014  . Right hemiparesis 07/05/2014  . Dysphagia, post-stroke 07/05/2014  . Dysarthria due to recent cerebral infarction 07/05/2014  . Left pontine stroke 07/02/2014  . Embolic stroke involving left carotid artery 06/28/2014  . Stroke 06/28/2014  . CAP (community acquired pneumonia) 06/28/2014  . Normocytic anemia 06/28/2014  . Essential hypertension 06/28/2014  .  Hypokalemia 06/28/2014    Narda Bonds 04/18/2015, 12:13 PM  [facility] [address] [place], [place], [address] Phone: [phone]   Fax:  [place], [place] 04/18/2015 12:17 PM Phone: [phone] Fax: [phone]

## 2015-04-18 NOTE — Therapy (Signed)
[facility] [address] [place], [place], [address] Phone: [phone]   Fax:  [phone]  Occupational Therapy Treatment  Patient Details  Name: [name] MRN: [id number] Date of Birth: Jun 04, 1926 Referring Provider:  Gaynelle Arabian, MD  Encounter Date: 04/18/2015      OT End of Session - 04/18/15 1122    Visit Number 7   Number of Visits 16   Date for OT Re-Evaluation 05/21/15   Authorization Type medicare will need g code and progress note every 10th visit   Authorization Time Period 05/21/2015   Authorization - Visit Number 7   Authorization - Number of Visits 10   OT Start Time 1017   OT Stop Time 1100   OT Time Calculation (min) 43 min   Activity Tolerance Patient tolerated treatment well      Past Medical History  Diagnosis Date  . Hypertension   . Cancer   . Stroke     Past Surgical History  Procedure Laterality Date  . Appendectomy    . Kidney surgery      There were no vitals filed for this visit.  Visit Diagnosis:  Pain, hand joint, right  Pain in joint, shoulder region, right  Impaired functional mobility and activity tolerance  Impaired cognition  Spastic hemiplegia affecting nondominant side  Impaired activities of daily living      Subjective Assessment - 04/18/15 1114    Subjective  "that is stinging like needles" (in refernce to splint on dorsal surface of R hand)   Patient is accompained by: Family member  wife, PCA   Pertinent History L CVA, small vessel disease and atrophy, see epic snapshot   Patient Stated Goals I just want to get better                      OT Treatments/Exercises (OP) - 04/18/15 0001    Splinting   Splinting Continued with fabrication of modified dorsal hand splint.  Pt was able to initially tolerate splinting material on dorsal surface when warm and compliant however despite numerous attempts to adjust splint, pt with  significant parasthesias on dorsal aspect of R hand that are aggravated by hard splint material. Pt unable to tolerated dorsal resting hand splint. Pt does not tolerate discomfort well  and this is futher exacerbated by cognitive deficits including decreased insight (but if I wear that splint I can't use my hand) and low frustration tolerance.  Made pattern for modified ventral surface splint with cone roll for fingers and modified to conform to existing contractures (severe wrist flexion and ulnar deviation).  Will fabricate new splint next session as pt tolerates.                   OT Short Term Goals - 04/18/15 1119    OT SHORT TERM GOAL #1   Title Pt ,family and PCA will be mod I with HEP - 04/23/2015   Status Achieved   OT SHORT TERM GOAL #2   Title Pt will tolerate new splint to assist with spasticity and ROM per wearing schedule   Status On-going   OT SHORT TERM GOAL #3   Title Wife and PCA will demonstrate understanding of wear/care of splint as well as wearing schedule.    Status On-going   OT SHORT TERM GOAL #4   Title Pt will be supervision with simple grooming activities (vc's to intiate)   Status On-going   OT  SHORT TERM GOAL #5   Title Pt will be max for UB bathing   Status On-going   OT SHORT TERM GOAL #6   Title Pt will be mod a for toilet transfers   Status On-going           OT Long Term Goals - 04/18/15 1120    OT LONG TERM GOAL #1   Title Pt, wife and PCA will be mod I with upgraded HEP -05/21/2015   Status On-going   OT LONG TERM GOAL #2   Title Pt will be mod a for UB bathing   Status On-going   OT LONG TERM GOAL #3   Title Pt will be mod a for LB bathing   Status On-going   OT LONG TERM GOAL #4   Title Pt will be min a for standing balance to allow PCA to hike pants using grab bar.   Status On-going   OT LONG TERM GOAL #5   Title Pt will demonstrate improved PROM in RUE to ease in caregiver's ability to complete UB ADL's   Status On-going    OT LONG TERM GOAL #6   Title Pt will be min a for toilet transfers   Status On-going   OT LONG TERM GOAL #7   Title Pt will be mod a for shower transfers   Status On-going               Plan - 04/18/15 1120    Clinical Impression Statement Pt with poor tolerance for positioning devices including splint for R hand. Pt unable to tolerate dorsal resting hand splint despite good fit.  Continue to work with pt to fabricate splint he can tolerate given his numeorus deficits. Wife and pt in agreement.   Pt will benefit from skilled therapeutic intervention in order to improve on the following deficits (Retired) Abnormal gait;Decreased activity tolerance;Decreased balance;Decreased cognition;Decreased mobility;Decreased range of motion;Decreased safety awareness;Decreased strength;Impaired UE functional use;Impaired tone;Pain   Rehab Potential Fair   Clinical Impairments Affecting Rehab Potential cognition   OT Frequency 2x / week   OT Duration 8 weeks   OT Treatment/Interventions Self-care/ADL training;Ultrasound;Traction;Moist Heat;Fluidtherapy;DME and/or AE instruction;Neuromuscular education;Therapeutic exercise;Functional Mobility Training;Manual Therapy;Passive range of motion;Splinting;Therapeutic activities;Balance training;Patient/family education   Plan fabricate ventral modified resting hand splint as pt can tolerate.    OT Home Exercise Plan Education provided:  04/04/15 initiated PROM HEP, reinforced 03/05/2015   Consulted and Agree with Plan of Care Patient;Family member/caregiver   Family Member Consulted  caregiver, wife        Problem List Patient Active Problem List   Diagnosis Date Noted  . Sepsis 10/08/2014  . Acute encephalopathy 10/08/2014  . UTI (lower urinary tract infection) 10/08/2014  . Elevated troponin 10/08/2014  . Right hemiparesis 07/05/2014  . Dysphagia, post-stroke 07/05/2014  . Dysarthria due to recent cerebral infarction 07/05/2014  . Left pontine  stroke 07/02/2014  . Embolic stroke involving left carotid artery 06/28/2014  . Stroke 06/28/2014  . CAP (community acquired pneumonia) 06/28/2014  . Normocytic anemia 06/28/2014  . Essential hypertension 06/28/2014  . Hypokalemia 06/28/2014    Quay Burow, OTR/L 04/18/2015, 11:23 AM  [facility] [address] [place], [place], [address] Phone: [phone]   Fax:  [phone]

## 2015-04-22 DIAGNOSIS — L089 Local infection of the skin and subcutaneous tissue, unspecified: Secondary | ICD-10-CM | POA: Diagnosis not present

## 2015-04-22 DIAGNOSIS — T148 Other injury of unspecified body region: Secondary | ICD-10-CM | POA: Diagnosis not present

## 2015-04-23 ENCOUNTER — Ambulatory Visit: Payer: Medicare Other | Admitting: Occupational Therapy

## 2015-04-23 ENCOUNTER — Ambulatory Visit: Payer: Medicare Other | Admitting: Physical Therapy

## 2015-04-23 ENCOUNTER — Encounter: Payer: Self-pay | Admitting: Occupational Therapy

## 2015-04-23 DIAGNOSIS — R4189 Other symptoms and signs involving cognitive functions and awareness: Secondary | ICD-10-CM

## 2015-04-23 DIAGNOSIS — G8110 Spastic hemiplegia affecting unspecified side: Secondary | ICD-10-CM | POA: Diagnosis not present

## 2015-04-23 DIAGNOSIS — IMO0001 Reserved for inherently not codable concepts without codable children: Secondary | ICD-10-CM

## 2015-04-23 DIAGNOSIS — Z7409 Other reduced mobility: Secondary | ICD-10-CM

## 2015-04-23 DIAGNOSIS — R269 Unspecified abnormalities of gait and mobility: Secondary | ICD-10-CM

## 2015-04-23 DIAGNOSIS — Z789 Other specified health status: Secondary | ICD-10-CM

## 2015-04-23 NOTE — Patient Instructions (Signed)
Toilet Transfers:  Do this the same way every time [name] transfers to the toilet!!  1.  "[name], scoot forward in your wheelchair (He can do this if the chair is stable - DO NOT HELP unless he has tried a few times and can't)  2.  "I need to put my foot between yours." (Check to make sure his feet are back far enough to help him stand and that your foot is between his)  3.  "Grab the grab bar" (this encourages him to lean forward)  4. "Can you stand up?"  (lightly put your hands on his left arm but DO NOT LIFT - he can stand using the grab bar"  5. "Step around to the toilet"  (just guide if he is going to the right - do not push or pull. If he is going to the left he needs just a little bit of help.If you pull he will push back against you!)  6. "Can you stand now so I can help you with your pants?"  (Transfer him to toilet and let him sit, THEN have him stand and help with his pants.  When he is done, have him stand and help with his pants and SIT BACK DOWN. Then transfer back to wheelchair).    This should be done in four steps: - Transfer to toilet - Stand to get pants down - Stand to get pants up and sit back down - Transfer back to chair

## 2015-04-23 NOTE — Therapy (Signed)
[facility] [address] [place], [place], [address] Phone: [phone]   Fax:  [phone]  Occupational Therapy Treatment  Patient Details  Name: [name] MRN: [id number] Date of Birth: January 02, 1926 Referring Provider:  Gaynelle Arabian, MD  Encounter Date: 04/23/2015      OT End of Session - 04/23/15 1049    Visit Number 8   Number of Visits 16   Date for OT Re-Evaluation 05/21/15   Authorization Type medicare will need g code and progress note every 10th visit   Authorization Time Period 05/21/2015   Authorization - Visit Number 8   Authorization - Number of Visits 10   OT Start Time 0845   OT Stop Time 0932   OT Time Calculation (min) 47 min   Activity Tolerance Patient tolerated treatment well      Past Medical History  Diagnosis Date  . Hypertension   . Cancer   . Stroke     Past Surgical History  Procedure Laterality Date  . Appendectomy    . Kidney surgery      There were no vitals filed for this visit.  Visit Diagnosis:  Impaired functional mobility and activity tolerance  Spastic hemiplegia affecting nondominant side  Impaired cognition  Impaired activities of daily living      Subjective Assessment - 04/23/15 0933    Subjective  I watched the debate last night   Patient is accompained by: --  PCA with pt today   Pertinent History L CVA, small vessel disease and atrophy, see epic snapshot   Patient Stated Goals I just want to get better   Currently in Pain? No/denies  no pain at rest/pt does have pain in RUE wiith ROM                      OT Treatments/Exercises (OP) - 04/23/15 0001    ADLs   Functional Mobility Practiced toilet transfers with pt and pt's PCA.  With step by step cuing and attention to foot position, alignment and proper postioining pt is able to transfer to and from the toilet with light min a. Once pt is seated on toilet pt is able to stand  using grab bar with light contact guard for pants manipulation.  Practiced transfer with PCA several times and practiced cueing as well. Given that pt has at least three PCA"s instuction sheet made for bathroom and PCA will post near toilet. PCA also will instruct weekend PCA.  PCA today able to return demonstrate after practice and able to verbalize steps.  Recommend that grab bar at home be replaced with longer bar (30inches) to enourage forward lean and 33 inches from floor to encourage full standing. Also recommend taking wheelchair to [facility] to have brakes fixed and to remove arm trough and replace right arm rest pad.  All instructions and recommendations provided in writing and PCA has agreed to take wheelchair to [facility]. PCA also verbalized understanding of all information and will share with wife.                 OT Education - 04/23/15 1045    Education provided Yes   Education Details toilet transfers, equipment recommendations   Person(s) Educated Patient;Caregiver(s)   Methods Explanation;Demonstration;Tactile cues;Verbal cues;Handout   Comprehension Verbalized understanding;Returned demonstration  pt and caregiver together           OT Short Term Goals - 04/23/15 1046  OT SHORT TERM GOAL #1   Title Pt ,family and PCA will be mod I with HEP - 04/23/2015   Status Achieved   OT SHORT TERM GOAL #2   Title Pt will tolerate new splint to assist with spasticity and ROM per wearing schedule   Status On-going  contnue to work toward fabrication of splint pt can tolerate   OT SHORT TERM GOAL #3   Title Wife and PCA will demonstrate understanding of wear/care of splint as well as wearing schedule.    Status On-going  see above   OT SHORT TERM GOAL #4   Title Pt will be supervision with simple grooming activities (vc's to intiate)   Status Achieved   OT SHORT TERM GOAL #5   Title Pt will be max for UB bathing   Status Achieved   OT SHORT TERM GOAL #6   Title  Pt will be mod a for toilet transfers   Status Achieved           OT Long Term Goals - 04/23/15 1047    OT LONG TERM GOAL #1   Title Pt, wife and PCA will be mod I with upgraded HEP -05/21/2015   Status On-going   OT LONG TERM GOAL #2   Title Pt will be mod a for UB bathing   Status On-going   OT LONG TERM GOAL #3   Title Pt will be mod a for LB bathing   Status On-going   OT LONG TERM GOAL #4   Title Pt will be min a for standing balance to allow PCA to hike pants using grab bar.   Status On-going   OT LONG TERM GOAL #5   Title Pt will demonstrate improved PROM in RUE to ease in caregiver's ability to complete UB ADL's   Status On-going   OT LONG TERM GOAL #6   Title Pt will be min a for toilet transfers   Status On-going   OT LONG TERM GOAL #7   Title Pt will be mod a for shower transfers   Status On-going               Plan - 04/23/15 1047    Clinical Impression Statement Pt is making slow progress toward goals.  Recommendations made today for altering how they are performing toilet transfers that allows pt to be more independent.    Pt will benefit from skilled therapeutic intervention in order to improve on the following deficits (Retired) Abnormal gait;Decreased activity tolerance;Decreased balance;Decreased cognition;Decreased mobility;Decreased range of motion;Decreased safety awareness;Decreased strength;Impaired UE functional use;Impaired tone;Pain   Rehab Potential Fair   Clinical Impairments Affecting Rehab Potential cognition, severity of defiicits   OT Frequency 2x / week   OT Duration 8 weeks   OT Treatment/Interventions Self-care/ADL training;Ultrasound;Traction;Moist Heat;Fluidtherapy;DME and/or AE instruction;Neuromuscular education;Therapeutic exercise;Functional Mobility Training;Manual Therapy;Passive range of motion;Splinting;Therapeutic activities;Balance training;Patient/family education   Plan fabricate ventral modified resting hand splitn as  pt can tolerate   OT Home Exercise Plan Education provided:  04/04/15 initiated PROM HEP, reinforced 03/05/2015; toilet transfer training and equipment recoomendations 04/23/2015   Consulted and Agree with Plan of Care Patient;Family member/caregiver   Family Member Consulted PCA        Problem List Patient Active Problem List   Diagnosis Date Noted  . Sepsis 10/08/2014  . Acute encephalopathy 10/08/2014  . UTI (lower urinary tract infection) 10/08/2014  . Elevated troponin 10/08/2014  . Right hemiparesis 07/05/2014  . Dysphagia, post-stroke 07/05/2014  . Dysarthria  due to recent cerebral infarction 07/05/2014  . Left pontine stroke 07/02/2014  . Embolic stroke involving left carotid artery 06/28/2014  . Stroke 06/28/2014  . CAP (community acquired pneumonia) 06/28/2014  . Normocytic anemia 06/28/2014  . Essential hypertension 06/28/2014  . Hypokalemia 06/28/2014    Quay Burow, OTR/L 04/23/2015, 10:51 AM  [facility] [address] [place], [place], [address] Phone: [phone]   Fax:  [phone]

## 2015-04-24 NOTE — Therapy (Signed)
[facility] [address] [place], [place], [address] Phone: [phone]   Fax:  [phone]  Physical Therapy Treatment  Patient Details  Name: [name] MRN: [id number] Date of Birth: [birth year] Referring Provider:  Gaynelle Arabian, MD  Encounter Date: 04/23/2015      PT End of Session - 04/24/15 0827    Visit Number 8   Number of Visits 17   Date for PT Re-Evaluation 05/25/15   Authorization Type G-code every 10th visit   PT Start Time 1019   PT Stop Time 1103   PT Time Calculation (min) 44 min   Equipment Utilized During Treatment Gait belt   Activity Tolerance Patient tolerated treatment well   Behavior During Therapy [facility] for tasks assessed/performed      Past Medical History  Diagnosis Date  . Hypertension   . Cancer   . Stroke     Past Surgical History  Procedure Laterality Date  . Appendectomy    . Kidney surgery      There were no vitals filed for this visit.  Visit Diagnosis:  Abnormality of gait      Subjective Assessment - 04/24/15 0825    Subjective Pt and caregiver deny changes other than pt did not wear his R knee brace last night.  Pt does not remember taking it off.   Patient is accompained by: --  caregiver   Pertinent History HTN, hx of CA,    Patient Stated Goals I want to get back to normal.    Currently in Pain? No/denies     Pt placed in standing frame x 8 minutes with UE support for bil LE stretching prior to attempted gait.   Gait in parallel bars x 8'x3 with +2 total assist (pt=45%) with poor R knee extension, posterior lean and lean to R side.  Gait with hemiwalker x 55' with +2 total assist (pt=65%).  Cues for sequence and posture and R knee extension.  PTA assisted with R knee extension at times.  Poor R foot placement.  Caregiver pulled chair behind pt as well.         PT Education - 04/24/15 [phone]    Education provided Yes   Education Details purpose of  standing frame and knee brace;not attempting ambulating at home with or without family   Person(s) Educated Patient;Caregiver(s)   Methods Explanation;Demonstration   Comprehension Verbalized understanding          PT Short Term Goals - 04/05/15 1217    PT SHORT TERM GOAL #1   Title Pt will be ind. in performing with caregiver assist in order to improve strength, flexibility, and balance. Target date: 04/23/2015   Status On-going   PT SHORT TERM GOAL #2   Title Perform BERG, TUG, and gait speed when pt able to stand/amb. for longer durations (if appropriate). Target date: 04/23/15   Status On-going   PT SHORT TERM GOAL #3   Title Pt will stand with no UE and min guard for 1 minute in order to perform ADLs. Target date: 04/23/15.   Status On-going   PT SHORT TERM GOAL #4   Title Pt will ambulate 21' with LRAD and min A to improve functional mobiltiy. Target date: 04/23/15.   Status On-going           PT Long Term Goals - 04/05/15 1218    PT LONG TERM GOAL #1   Title Pt will verbalize understanding of CVA risk factors/sign/symptoms to  decrease risk of another CVA. Target date: 05/21/15.   Status On-going   PT LONG TERM GOAL #2   Title Pt will ambulate 1' with min guard and LRAD to improve functional mobility. Target date: 05/21/15.   Status On-going   PT LONG TERM GOAL #3   Title Pt will reach 5" outside BOS with supervision, while standing with no UE support, to perform ADLs. Target date: 05/21/15.   Status On-going   PT LONG TERM GOAL #4   Title Pt will propel manual w/c 150' with L UE/L LE with supervision to improve functional mobility. Target date: 05/21/15.   Status On-going               Plan - 04/24/15 0827    Clinical Impression Statement Pt able to ambulate today after stretching in standing frame and ambulation in bars.  Cooperative during treatment.  Continue PT per POC.   Pt will benefit from skilled therapeutic intervention in order to improve on the  following deficits Abnormal gait;Decreased endurance;Decreased activity tolerance;Decreased balance;Decreased knowledge of use of DME;Decreased strength;Impaired UE functional use;Decreased cognition;Decreased coordination;Decreased safety awareness;Impaired flexibility;Postural dysfunction;Decreased range of motion;Decreased mobility   Rehab Potential Fair   Clinical Impairments Affecting Rehab Potential impaired cognition and B knee contractures   PT Frequency 2x / week   PT Duration 8 weeks   PT Treatment/Interventions ADLs/Self Care Home Management;Neuromuscular re-education;Biofeedback;Electrical Stimulation;Therapeutic activities;Therapeutic exercise;Balance training;Manual techniques;Vestibular;Wheelchair mobility training;Orthotic Fit/Training;Functional mobility training;Patient/family education;Gait training;Stair training;DME Instruction   PT Next Visit Plan standing frame for stretching, standing and gait in parallel bars/gait with hemiwalker.   PT Home Exercise Plan Stretch and balance HEP   Consulted and Agree with Plan of Care Patient;Family member/caregiver   Family Member Consulted caregiver        Problem List Patient Active Problem List   Diagnosis Date Noted  . Sepsis 10/08/2014  . Acute encephalopathy 10/08/2014  . UTI (lower urinary tract infection) 10/08/2014  . Elevated troponin 10/08/2014  . Right hemiparesis 07/05/2014  . Dysphagia, post-stroke 07/05/2014  . Dysarthria due to recent cerebral infarction 07/05/2014  . Left pontine stroke 07/02/2014  . Embolic stroke involving left carotid artery 06/28/2014  . Stroke 06/28/2014  . CAP (community acquired pneumonia) 06/28/2014  . Normocytic anemia 06/28/2014  . Essential hypertension 06/28/2014  . Hypokalemia 06/28/2014    Narda Bonds 04/24/2015, 8:29 AM  [facility] [address] [place], [place], [address] Phone: [phone]   Fax:   [place] 04/24/2015 8:29 AM Phone: [phone] Fax: [phone]

## 2015-04-26 ENCOUNTER — Ambulatory Visit: Payer: Medicare Other

## 2015-04-26 ENCOUNTER — Ambulatory Visit: Payer: Medicare Other | Admitting: Occupational Therapy

## 2015-04-26 DIAGNOSIS — R2681 Unsteadiness on feet: Secondary | ICD-10-CM

## 2015-04-26 DIAGNOSIS — R531 Weakness: Secondary | ICD-10-CM

## 2015-04-26 DIAGNOSIS — M79641 Pain in right hand: Secondary | ICD-10-CM

## 2015-04-26 DIAGNOSIS — R269 Unspecified abnormalities of gait and mobility: Secondary | ICD-10-CM

## 2015-04-26 DIAGNOSIS — G8110 Spastic hemiplegia affecting unspecified side: Secondary | ICD-10-CM | POA: Diagnosis not present

## 2015-04-26 NOTE — Therapy (Signed)
[facility] [address] [place], [place], [address] Phone: [phone]   Fax:  [phone]  Occupational Therapy Treatment  Patient Details  Name: [name] MRN: [id number] Date of Birth: April 14, 1926 Referring Provider:  Gaynelle Arabian, MD  Encounter Date: 04/26/2015      OT End of Session - 04/26/15 1254    Visit Number 9   Date for OT Re-Evaluation 05/21/15   Authorization Type medicare will need g code and progress note every 10th visit   Authorization Time Period 05/21/2015   Authorization - Visit Number 9   Authorization - Number of Visits 10   OT Start Time 1106   OT Stop Time 1149   OT Time Calculation (min) 43 min   Activity Tolerance Patient tolerated treatment well   Behavior During Therapy [facility] for tasks assessed/performed      Past Medical History  Diagnosis Date  . Hypertension   . Cancer   . Stroke     Past Surgical History  Procedure Laterality Date  . Appendectomy    . Kidney surgery      There were no vitals filed for this visit.  Visit Diagnosis:  Pain, hand joint, right  Right sided weakness      Subjective Assessment - 04/26/15 1250    Pertinent History L CVA, small vessel disease and atrophy, see epic snapshot   Patient Stated Goals I just want to get better   Currently in Pain? Yes   Pain Score 4    Pain Location Hand   Pain Orientation Right   Pain Descriptors / Indicators Aching;Stabbing   Pain Type Chronic pain   Pain Frequency Intermittent   Aggravating Factors  movements   Pain Relieving Factors repositioning   Multiple Pain Sites No       Treatment: Therapist initiated fabrication and fitting of  custom volar forearm based roll splint for improved positioning. (A second occupational therapist assisted with fitting/ positioning due to pt spasticity and anticipation of pain. This therapist performed gentle stretching to hand while other therapist made  adjustments to splint). Splint was not issued due to time constraints and the need for caregiver education due to the complexity of application due to pt spasticity. Pt's caregiver reports that the brakes were fixed on w/c and arm trough was removed for improved positioning.                         OT Short Term Goals - 04/23/15 1046    OT SHORT TERM GOAL #1   Title Pt ,family and PCA will be mod I with HEP - 04/23/2015   Status Achieved   OT SHORT TERM GOAL #2   Title Pt will tolerate new splint to assist with spasticity and ROM per wearing schedule   Status On-going  contnue to work toward fabrication of splint pt can tolerate   OT SHORT TERM GOAL #3   Title Wife and PCA will demonstrate understanding of wear/care of splint as well as wearing schedule.    Status On-going  see above   OT SHORT TERM GOAL #4   Title Pt will be supervision with simple grooming activities (vc's to intiate)   Status Achieved   OT SHORT TERM GOAL #5   Title Pt will be max for UB bathing   Status Achieved   OT SHORT TERM GOAL #6   Title Pt will be mod a for toilet transfers  Status Achieved           OT Long Term Goals - 04/23/15 1047    OT LONG TERM GOAL #1   Title Pt, wife and PCA will be mod I with upgraded HEP -05/21/2015   Status On-going   OT LONG TERM GOAL #2   Title Pt will be mod a for UB bathing   Status On-going   OT LONG TERM GOAL #3   Title Pt will be mod a for LB bathing   Status On-going   OT LONG TERM GOAL #4   Title Pt will be min a for standing balance to allow PCA to hike pants using grab bar.   Status On-going   OT LONG TERM GOAL #5   Title Pt will demonstrate improved PROM in RUE to ease in caregiver's ability to complete UB ADL's   Status On-going   OT LONG TERM GOAL #6   Title Pt will be min a for toilet transfers   Status On-going   OT LONG TERM GOAL #7   Title Pt will be mod a for shower transfers   Status On-going                Plan - 04/26/15 1252    Clinical Impression Statement Pt is progressing towards goals. Therapist initatiated fabrication of custom roll splint for pt. Splint was not issued due to time constraints and cargiver practice/ education needed.   Pt will benefit from skilled therapeutic intervention in order to improve on the following deficits (Retired) Abnormal gait;Decreased activity tolerance;Decreased balance;Decreased cognition;Decreased mobility;Decreased range of motion;Decreased safety awareness;Decreased strength;Impaired UE functional use;Impaired tone;Pain   Rehab Potential Fair   OT Frequency 2x / week   OT Duration 8 weeks   Plan G code next vist !!!!    complete roll splint fitting and issue along with caregiver eduction.   OT Home Exercise Plan Education provided:  04/04/15 initiated PROM HEP, reinforced 03/05/2015; toilet transfer training and equipment recoomendations 04/23/2015   Consulted and Agree with Plan of Care Patient   Family Member Consulted PCA        Problem List Patient Active Problem List   Diagnosis Date Noted  . Sepsis 10/08/2014  . Acute encephalopathy 10/08/2014  . UTI (lower urinary tract infection) 10/08/2014  . Elevated troponin 10/08/2014  . Right hemiparesis 07/05/2014  . Dysphagia, post-stroke 07/05/2014  . Dysarthria due to recent cerebral infarction 07/05/2014  . Left pontine stroke 07/02/2014  . Embolic stroke involving left carotid artery 06/28/2014  . Stroke 06/28/2014  . CAP (community acquired pneumonia) 06/28/2014  . Normocytic anemia 06/28/2014  . Essential hypertension 06/28/2014  . Hypokalemia 06/28/2014    RINE,KATHRYN 04/26/2015, 12:56 PM Theone Murdoch, OTR/L [phone] Phone: [phone] 12:56 PM 04/26/2015 [facility] [address] [place], [place], [address] Phone: [phone]   Fax:  [phone]

## 2015-04-26 NOTE — Patient Instructions (Signed)
  Mr. [name] is demonstrating progress, as he partially met his short term standing balance goal and walking goal. He was also able to walk longer distances with hemi-walker and perform a timed-up-and-go test (which includes turns and sit to stand transfers), which he was not able to attempt during the first few sessions. Mr. [name] also expressed that he feels that he is making progress.

## 2015-04-26 NOTE — Therapy (Signed)
[facility] [address] [place], [place], [address] Phone: [phone]   Fax:  [phone]  Physical Therapy Treatment  Patient Details  Name: [name] MRN: [id number] Date of Birth: 1926-05-28 Referring Provider:  Gaynelle Arabian, MD  Encounter Date: 04/26/2015      PT End of Session - 04/26/15 1121    Visit Number 9   Number of Visits 17   Date for PT Re-Evaluation 05/25/15   Authorization Type G-code every 10th visit   PT Start Time 1017   PT Stop Time 1100   PT Time Calculation (min) 43 min   Equipment Utilized During Treatment Gait belt   Activity Tolerance Patient tolerated treatment well   Behavior During Therapy [facility] for tasks assessed/performed      Past Medical History  Diagnosis Date  . Hypertension   . Cancer   . Stroke     Past Surgical History  Procedure Laterality Date  . Appendectomy    . Kidney surgery      There were no vitals filed for this visit.  Visit Diagnosis:  Abnormality of gait  Unsteadiness      Subjective Assessment - 04/26/15 1020    Subjective Pt denied falls or changes since last visit.    Patient is accompained by: --  caregiver   Pertinent History HTN, hx of CA,    Patient Stated Goals I want to get back to normal.    Currently in Pain? No/denies                         [facility] Adult PT Treatment/Exercise - 04/26/15 1022    Transfers   Transfers Sit to Stand;Stand to Sit   Sit to Stand 4: Min assist   Sit to Stand Details Tactile cues for sequencing;Tactile cues for placement;Verbal cues for technique;Verbal cues for sequencing   Sit to Stand Details (indicate cue type and reason) Cues to improve ant. wt. shifting.   Stand to Sit 4: Min guard   Stand to Sit Details (indicate cue type and reason) Verbal cues for technique   Stand to Sit Details Cues to improve eccentric control.   Number of Reps Other reps (comment)  4 reps   Ambulation/Gait   Ambulation/Gait Yes   Ambulation/Gait Assistance 4: Min assist;3: Mod assist   Ambulation/Gait Assistance Details VC's, tactile cues and demonstration to improve upright posture, shift weight ant., improve lateral wt. shifting to R LE, and improve narrow BOS. Performed with +2 assist for safety, tech to guard, and PT to faciliate R LE movement and weight shifting. Pt required more assist during last 2 bouts of amb. 2/2 fatigue. Pt required seated rest breaks after each bout of amb.   Ambulation Distance (Feet) --  40x'2, 25'   Assistive device Hemi-walker   Gait Pattern Step-to pattern;Decreased dorsiflexion - right;Right flexed knee in stance;Narrow base of support;Trunk flexed  pt's R heel cord contracture does not allow R heel strike   Ambulation Surface Level;Indoor   Gait velocity 0.27f/sec.  with hemi-walker, with min-mod A (+2 assist for safety)   Balance   Balance Assessed Yes   Static Standing Balance   Static Standing - Balance Support Left upper extremity supported   Static Standing - Level of Assistance 4: Min assist;Other (comment)  min guard    Static Standing - Comment/# of Minutes In // bars with L UE support on bar, cues to improve ant. wt. shifting  and upright posture. Pt performed for one minute.   Standardized Balance Assessment   Standardized Balance Assessment Timed Up and Go Test   Timed Up and Go Test   TUG Normal TUG   Normal TUG (seconds) 82.1  with hemi-walker and min-mod A with +2 assist for safety                PT Education - 04/26/15 1118    Education provided Yes   Education Details Pt reiterated the importance of performing HEP at home to improve flexilbilty, strength, and balance. PT reiterated that pt should not attempt amb. at home, as he requires 2 people to assist him during therapy. PT also encouraged pt to wear R knee immobilizer as prescribed by MD, pt's caregiver reports pt is wearing it 8 hours per night. PT wrote  progress note to pt's wife, Herbert Pun, per pt and pt's caregiver request.   Person(s) Educated Patient;Caregiver(s)   Methods Explanation   Comprehension Verbalized understanding          PT Short Term Goals - 04/26/15 1123    PT SHORT TERM GOAL #1   Title Pt will be ind. in performing with caregiver assist in order to improve strength, flexibility, and balance. Target date: 04/23/2015   Status On-going   PT SHORT TERM GOAL #2   Title Perform BERG, TUG, and gait speed when pt able to stand/amb. for longer durations (if appropriate). Target date: 04/23/15   Status Partially Met   PT SHORT TERM GOAL #3   Title Pt will stand with no UE and min guard for 1 minute in order to perform ADLs. Target date: 04/23/15.   Status Partially Met   PT SHORT TERM GOAL #4   Title Pt will ambulate 7' with LRAD and min A to improve functional mobiltiy. Target date: 04/23/15.   Status Partially Met           PT Long Term Goals - 04/26/15 1124    PT LONG TERM GOAL #1   Title Pt will verbalize understanding of CVA risk factors/sign/symptoms to decrease risk of another CVA. Target date: 05/21/15.   Status On-going   PT LONG TERM GOAL #2   Title Pt will ambulate 36' with min guard and LRAD to improve functional mobility. Target date: 05/21/15.   Status On-going   PT LONG TERM GOAL #3   Title Pt will reach 5" outside BOS with supervision, while standing with no UE support, to perform ADLs. Target date: 05/21/15.   Status On-going   PT LONG TERM GOAL #4   Title Pt will propel manual w/c 150' with L UE/L LE with supervision to improve functional mobility. Target date: 05/21/15.   Status On-going               Plan - 04/26/15 1121    Clinical Impression Statement Pt demonstrated progress as he partially met STGs 2, 3 and 4. PT will assess HEP next visit. Pt's TUG and gait speed indicate pt is at a falls risk. Pt would continue to benefit from skilled PT to improve safety during functional mobility.    Pt will benefit from skilled therapeutic intervention in order to improve on the following deficits Abnormal gait;Decreased endurance;Decreased activity tolerance;Decreased balance;Decreased knowledge of use of DME;Decreased strength;Impaired UE functional use;Decreased cognition;Decreased coordination;Decreased safety awareness;Impaired flexibility;Postural dysfunction;Decreased range of motion;Decreased mobility   Rehab Potential Fair   Clinical Impairments Affecting Rehab Potential impaired cognition and B knee contractures   PT Frequency  2x / week   PT Duration 8 weeks   PT Treatment/Interventions ADLs/Self Care Home Management;Neuromuscular re-education;Biofeedback;Electrical Stimulation;Therapeutic activities;Therapeutic exercise;Balance training;Manual techniques;Vestibular;Wheelchair mobility training;Orthotic Fit/Training;Functional mobility training;Patient/family education;Gait training;Stair training;DME Instruction   PT Next Visit Plan G-CODE, and assess HEP with caregiver's assist (STG 1).   PT Home Exercise Plan Stretch and balance HEP   Consulted and Agree with Plan of Care Patient   Family Member Consulted caregiver        Problem List Patient Active Problem List   Diagnosis Date Noted  . Sepsis 10/08/2014  . Acute encephalopathy 10/08/2014  . UTI (lower urinary tract infection) 10/08/2014  . Elevated troponin 10/08/2014  . Right hemiparesis 07/05/2014  . Dysphagia, post-stroke 07/05/2014  . Dysarthria due to recent cerebral infarction 07/05/2014  . Left pontine stroke 07/02/2014  . Embolic stroke involving left carotid artery 06/28/2014  . Stroke 06/28/2014  . CAP (community acquired pneumonia) 06/28/2014  . Normocytic anemia 06/28/2014  . Essential hypertension 06/28/2014  . Hypokalemia 06/28/2014    Miller,Jennifer L 04/26/2015, 11:25 AM  [facility] [address] [place], [place], [address] Phone:  [phone]   Fax:  [phone]     Geoffry Paradise, PT,DPT 04/26/2015 11:25 AM Phone: [phone] Fax: [phone]

## 2015-04-30 ENCOUNTER — Ambulatory Visit: Payer: Medicare Other | Admitting: Occupational Therapy

## 2015-04-30 ENCOUNTER — Ambulatory Visit: Payer: Medicare Other | Attending: Neurology

## 2015-04-30 ENCOUNTER — Encounter: Payer: Self-pay | Admitting: Occupational Therapy

## 2015-04-30 DIAGNOSIS — M25511 Pain in right shoulder: Secondary | ICD-10-CM | POA: Insufficient documentation

## 2015-04-30 DIAGNOSIS — R5381 Other malaise: Secondary | ICD-10-CM | POA: Insufficient documentation

## 2015-04-30 DIAGNOSIS — R269 Unspecified abnormalities of gait and mobility: Secondary | ICD-10-CM | POA: Diagnosis not present

## 2015-04-30 DIAGNOSIS — R4189 Other symptoms and signs involving cognitive functions and awareness: Secondary | ICD-10-CM | POA: Insufficient documentation

## 2015-04-30 DIAGNOSIS — R2681 Unsteadiness on feet: Secondary | ICD-10-CM | POA: Insufficient documentation

## 2015-04-30 DIAGNOSIS — Z7409 Other reduced mobility: Secondary | ICD-10-CM | POA: Insufficient documentation

## 2015-04-30 DIAGNOSIS — G8110 Spastic hemiplegia affecting unspecified side: Secondary | ICD-10-CM | POA: Diagnosis not present

## 2015-04-30 DIAGNOSIS — M6289 Other specified disorders of muscle: Secondary | ICD-10-CM | POA: Insufficient documentation

## 2015-04-30 DIAGNOSIS — R531 Weakness: Secondary | ICD-10-CM

## 2015-04-30 DIAGNOSIS — M79641 Pain in right hand: Secondary | ICD-10-CM | POA: Diagnosis not present

## 2015-04-30 NOTE — Patient Instructions (Signed)
Hand Splint    Wearing Schedule: Day Night As needed 2 hours per session. 2-3 sessions per day., after meal times Check skin condition after 30  minutes for changes in color, irritation or swelling.  Copyright  VHI. All rights reserved.

## 2015-04-30 NOTE — Patient Instructions (Signed)
HIP: Hamstrings - Short Sitting   Rest right leg on raised surface. Keep knee straight. Lift chest. Hold _1-2 minutes. Repeat with left leg on stool. _2-3__ reps per set, _2-3__ sets per day, _7__ days per week  Copyright  VHI. All rights reserved.   Perform at kitchen sink with wheelchair behind you for safety:  Feet Apart, Varied Arm Positions - Eyes Open   With eyes open, feet shoulder width apart, left hand on sink, look straight ahead at a stationary object. Hold __1-2 minutes. Repeat _3___ times per session. Do _2-3___ sessions per day.   Dorsiflexion: Stretch - Heel Cord / Gastrocnemius   Position (A) Patient: Lie or sit with right leg straight. Helper: Cup right heel. Make sure grip is firm. Motion (B) - Helper uses forearm to apply pressure to entire sole of foot, stretching foot toward shin. CAUTION: Stretch should be felt in calf. Do not allow foot to twist. Hold _30-45__ seconds. Repeat _3__ times. Do ___ sessions per day. Variation: Contract method: Resist ___ seconds. (see card G.G.-14)  Butterfly, Supine   Caregiver will Lie on back, feet together. Lower one knee toward floor. Hold _30__ seconds. Bring back to the middle and then bring your other knee towards the floor. Hold for 30 seconds. Repeat _3__ times per session. Do _2__ sessions per day.  Copyright  VHI. All rights reserved.   Leg Raise: Straight - Hook-Lying (Single Leg)   Lying down, keep right leg straight, with arm support, then Lift straight leg. Repeat 10__ times per set. Repeat with other leg. Do _2_ sets per session. Do _4_ sessions per week.  http://tub.exer.us/187   Copyright  VHI. All rights reserved.   HIP / KNEE: Flexion, Heel Slides - Supine   Slide right heel up toward buttocks, keeping leg in straight line. _2__ reps per set, _10__ sets per day, _4__ days per week Use towel or pillowcase under heel as needed. Perform in seated position, with left leg and towel placed on floor to  reduce friction. Copyright  VHI. All rights reserved.   KNEE: Extension, Long Arc Quad (Band)  Sit up tall and straighten left leg. Hold _2__ seconds. Don't use a band. _10__ reps per set, _2__ sets per day, _4Bridging   Slowly raise buttocks from floor, keeping stomach tight. Repeat __10__ times per set. Do __1__ sets per session. Do __2__ sessions per day.  http://orth.exer.us/1096   Copyright  VHI. All rights reserved.  __ days per week  Axial Extension (Chin Tuck)   Gently pull chin in while lengthening back of neck, caregiver can place hand on back of his neck to encourage pt to push head into hand. Hold __2__ seconds while counting out loud. Repeat _10___ times. Do _2-3___ sessions per day.  http://gt2.exer.us/553   Copyright  VHI. All rights reserved.   Flexibility: Upper Trapezius Stretch   Caregiver will gently grasp right side of head and gently apply pressure to bring patient's left ear to left shoulder. Tilt head away until a gentle stretch is felt. Hold _30___ seconds. Repeat __3__ times per set. Do __1__ sets per session. Do _2-3___ sessions per day.  http://orth.exer.us/340   Copyright  VHI. All rights reserved.    Scapular Retraction: Elbow Flexion (Seated)   With elbows bent to 90, pinch shoulder blades together, keeping elbows bent. Repeat _10___ times per set. Do __3__ sets per session. Do __1__ sessions per day.  http://orth.exer.us/948

## 2015-04-30 NOTE — Therapy (Addendum)
[facility] [address] [place], [place], [address] Phone: [phone]   Fax:  [phone]  Physical Therapy Treatment  Patient Details  Name: [name] MRN: [id number] Date of Birth: 14-Nov-1925 Referring Provider:  Gaynelle Arabian, MD  Encounter Date: 04/30/2015      PT End of Session - 04/30/15 1332    Visit Number 10   Number of Visits 17   Date for PT Re-Evaluation 05/25/15   Authorization Type G-code every 10th visit   PT Start Time 1018   PT Stop Time 1103   PT Time Calculation (min) 45 min   Equipment Utilized During Treatment Gait belt   Activity Tolerance Patient tolerated treatment well   Behavior During Therapy [facility] for tasks assessed/performed      Past Medical History  Diagnosis Date  . Hypertension   . Cancer ([place])   . Stroke [facility])     Past Surgical History  Procedure Laterality Date  . Appendectomy    . Kidney surgery      There were no vitals filed for this visit.  Visit Diagnosis:  Unsteadiness  Right sided weakness      Subjective Assessment - 04/30/15 1023    Subjective Pt denied falls or changes since last visit. Pt coughing upon entering gym. PT had speech therapist assist in mixing nectar thick water for pt.    Pertinent History HTN, hx of CA,    Patient Stated Goals I want to get back to normal.    Currently in Pain? No/denies       Therex: Pt performed seated HEP, with cues and demonstration for caregiver and pt during stretches. Please see pt instructions for details. PT will finish assessing supine therex next session.  Neuro re-ed: Sit<>stand with one UE assist and min A to stand and min guard to sit. Cues to improve anterior wt shifting. Pt stood with feet apart and no UE support with min A: 60 seconds. Cues for upright posture.                          PT Education - 04/30/15 1331    Education provided Yes   Education Details PT and  caregiver performed all seated HEP, and seated gastroc stretch vs. supine gastroc stretch. PT will assess caregiver's ability to perform supine HEP next session. PT reiterated the importance of educating all caregivers on how to assist pt in HEP.   Person(s) Educated Patient;Child(ren);Caregiver(s)  dtr-Suzanne and caregiver: Cristie Hem   Methods Explanation;Demonstration;Tactile cues;Verbal cues;Handout   Comprehension Returned demonstration;Verbalized understanding          PT Short Term Goals - 04/30/15 1335    PT SHORT TERM GOAL #1   Title Pt will be ind. in performing with caregiver assist in order to improve strength, flexibility, and balance. Target date: 04/23/2015   Status Partially Met   PT SHORT TERM GOAL #2   Title Perform BERG, TUG, and gait speed when pt able to stand/amb. for longer durations (if appropriate). Target date: 04/23/15   Status Partially Met   PT SHORT TERM GOAL #3   Title Pt will stand with no UE and min guard for 1 minute in order to perform ADLs. Target date: 04/23/15.   Status Partially Met   PT SHORT TERM GOAL #4   Title Pt will ambulate 45' with LRAD and min A to improve functional mobiltiy. Target date: 04/23/15.   Status Partially Met  PT Long Term Goals - 04/26/15 1124    PT LONG TERM GOAL #1   Title Pt will verbalize understanding of CVA risk factors/sign/symptoms to decrease risk of another CVA. Target date: 05/21/15.   Status On-going   PT LONG TERM GOAL #2   Title Pt will ambulate 23' with min guard and LRAD to improve functional mobility. Target date: 05/21/15.   Status On-going   PT LONG TERM GOAL #3   Title Pt will reach 5" outside BOS with supervision, while standing with no UE support, to perform ADLs. Target date: 05/21/15.   Status On-going   PT LONG TERM GOAL #4   Title Pt will propel manual w/c 150' with L UE/L LE with supervision to improve functional mobility. Target date: 05/21/15.   Status On-going                Plan - 05/16/2015 1333    Clinical Impression Statement Pt partially met STG 1, as new caregiver and pt required tactile and VC's during several exercises. Pt required encouragement to allow this caregiver to participate as he reported "she's only here for a little bit, because the other one is out". PT and pt's dtr had to explain the importance of performing HEP with all caregivers, as he has different ones during the weekend and weekday. Continue with POC.   Pt will benefit from skilled therapeutic intervention in order to improve on the following deficits Abnormal gait;Decreased endurance;Decreased activity tolerance;Decreased balance;Decreased knowledge of use of DME;Decreased strength;Impaired UE functional use;Decreased cognition;Decreased coordination;Decreased safety awareness;Impaired flexibility;Postural dysfunction;Decreased range of motion;Decreased mobility   Rehab Potential Fair   Clinical Impairments Affecting Rehab Potential impaired cognition and B knee contractures   PT Frequency 2x / week   PT Duration 8 weeks   PT Treatment/Interventions ADLs/Self Care Home Management;Neuromuscular re-education;Biofeedback;Electrical Stimulation;Therapeutic activities;Therapeutic exercise;Balance training;Manual techniques;Vestibular;Wheelchair mobility training;Orthotic Fit/Training;Functional mobility training;Patient/family education;Gait training;Stair training;DME Instruction   PT Next Visit Plan Finish assessing supine HEP.   PT Home Exercise Plan Stretch, strengthening, and balance HEP   Consulted and Agree with Plan of Care Patient;Family member/caregiver;Other (Comment)   Family Member [employer], Alex-caregiver          G-Codes - May 16, 2015 1335    Functional Assessment Tool Used Standing balance: 60 seconds with no UE support with min A, sit<>stand transfer: min A   Functional Limitation Mobility: Walking and moving around   Mobility: Walking and Moving Around Current  Status [phone]) At least 60 percent but less than 80 percent impaired, limited or restricted   Mobility: Walking and Moving Around Goal Status [phone]) At least 40 percent but less than 60 percent impaired, limited or restricted      Problem List Patient Active Problem List   Diagnosis Date Noted  . Sepsis ([place]) 10/08/2014  . Acute encephalopathy 10/08/2014  . UTI (lower urinary tract infection) 10/08/2014  . Elevated troponin 10/08/2014  . Right hemiparesis ([place]) 07/05/2014  . Dysphagia, post-stroke 07/05/2014  . Dysarthria due to recent cerebral infarction 07/05/2014  . Left pontine stroke ([place]) 07/02/2014  . Embolic stroke involving left carotid artery ([place]) 06/28/2014  . Stroke ([place]) 06/28/2014  . CAP (community acquired pneumonia) 06/28/2014  . Normocytic anemia 06/28/2014  . Essential hypertension 06/28/2014  . Hypokalemia 06/28/2014    Mylan Lengyel L 2015/05/16, 1:37 PM  [facility] [address] [place], [place], [address] Phone: [phone]   Fax:  [phone]      Physical Therapy Progress Note  Dates of Reporting Period: 03/26/15 to 04/30/15  Objective Reports of Subjective Statement: Pt is able to ambulate (unable to during first visit) and stand for longer periods of time.  Objective Measurements: Pt ambulated 40' with hemi-walker and min-mod A. Pt able to stand for 60 seconds and no UE, with min A.  Goal Update:      PT Short Term Goals - 04/30/15 1335    PT SHORT TERM GOAL #1   Title Pt will be ind. in performing with caregiver assist in order to improve strength, flexibility, and balance. Target date: 04/23/2015   Status Partially Met   PT SHORT TERM GOAL #2   Title Perform BERG, TUG, and gait speed when pt able to stand/amb. for longer durations (if appropriate). Target date: 04/23/15   Status Partially Met   PT SHORT TERM GOAL #3   Title Pt will stand with no UE and min guard for 1 minute  in order to perform ADLs. Target date: 04/23/15.   Status Partially Met   PT SHORT TERM GOAL #4   Title Pt will ambulate 21' with LRAD and min A to improve functional mobiltiy. Target date: 04/23/15.   Status Partially Met       Plan: Continue with progressing gait, strength, endurance, flexibility and safety.  Reason Skilled Services are Required: To improve safety during functional mobility and to improve IND.    Geoffry Paradise, PT,DPT 04/30/2015 1:40 PM Phone: [phone] Fax: [phone]

## 2015-04-30 NOTE — Therapy (Signed)
[facility] [address] [place], [place], [address] Phone: [phone]   Fax:  [phone]  Occupational Therapy Treatment  Patient Details  Name: [name] MRN: [id number] Date of Birth: September 12, 1925 Referring Provider:  Gaynelle Arabian, MD  Encounter Date: 04/30/2015      OT End of Session - 04/30/15 1250    Visit Number 10   Number of Visits 16   Date for OT Re-Evaluation 05/21/15   Authorization Type medicare will need g code and progress note every 10th visit   Authorization Time Period 05/21/2015   Authorization - Visit Number 10   Authorization - Number of Visits 10   OT Start Time 1104   OT Stop Time 1147   OT Time Calculation (min) 43 min   Activity Tolerance Patient tolerated treatment well   Behavior During Therapy [facility] for tasks assessed/performed      Past Medical History  Diagnosis Date  . Hypertension   . Cancer ([place])   . Stroke [facility])     Past Surgical History  Procedure Laterality Date  . Appendectomy    . Kidney surgery      There were no vitals filed for this visit.  Visit Diagnosis:  Pain, hand joint, right  Spastic hemiplegia affecting nondominant side (HCC)      Subjective Assessment - 04/30/15 1244    Subjective  I want to know how long I will wear this brace   Patient is accompained by: Family member  and caregiver   Pertinent History L CVA, small vessel disease and atrophy, see epic snapshot   Patient Stated Goals I just want to get better   Currently in Pain? No/denies   Pain Score 0-No pain                      OT Treatments/Exercises (OP) - 04/30/15 0001    Splinting   Splinting Completed custom resting hand splint to prevent further deformity in right hand and wrist, and to protect skin in palm of hand.  Patient's daughter and paid caregiver present today - spent ample time ensuring both could don and doff splint without discomfort for patient.   Disussed and agrred upon wearing schedule with patient in presence of daughter and caregiver.  Patient challenges all braces, and it was felt to be an important step in allowing him to have some decision making power in splint schedule.  Agreed to 2-3 times / day after meals, and for 2 hours at a time.  Instructed all that patient needed to wear splint today for 30 minutes and then skin checked for redness, edema, irritation.  If no problem, patient can begin wearing schedule after dinner this evening.                  OT Education - 04/30/15 1249    Education provided Yes   Education Details splint wear and care, reviewed short term goals, gave written copt of short term goals to daughter   Person(s) Educated Patient;Child(ren);Caregiver(s)   Methods Explanation   Comprehension Verbal cues required          OT Short Term Goals - 04/30/15 1254    OT SHORT TERM GOAL #1   Title Pt ,family and PCA will be mod I with HEP - 04/23/2015   Status Achieved   OT SHORT TERM GOAL #2   Title Pt will tolerate new splint to assist with spasticity and ROM per wearing  schedule   Status On-going  splint finalized 2023/05/15 - tolerates donning and doffing.  reassess next visit   OT SHORT TERM GOAL #3   Title Wife and PCA will demonstrate understanding of wear/care of splint as well as wearing schedule.    Status Achieved  daughter and caregiver   OT SHORT TERM GOAL #4   Title Pt will be supervision with simple grooming activities (vc's to intiate)   Status Achieved   OT SHORT TERM GOAL #5   Title Pt will be max for UB bathing   Status Achieved   OT SHORT TERM GOAL #6   Title Pt will be mod a for toilet transfers   Status Achieved           OT Long Term Goals - 04/23/15 1047    OT LONG TERM GOAL #1   Title Pt, wife and PCA will be mod I with upgraded HEP -05/21/2015   Status On-going   OT LONG TERM GOAL #2   Title Pt will be mod a for UB bathing   Status On-going   OT LONG TERM GOAL #3    Title Pt will be mod a for LB bathing   Status On-going   OT LONG TERM GOAL #4   Title Pt will be min a for standing balance to allow PCA to hike pants using grab bar.   Status On-going   OT LONG TERM GOAL #5   Title Pt will demonstrate improved PROM in RUE to ease in caregiver's ability to complete UB ADL's   Status On-going   OT LONG TERM GOAL #6   Title Pt will be min a for toilet transfers   Status On-going   OT LONG TERM GOAL #7   Title Pt will be mod a for shower transfers   Status On-going               Plan - 05-15-15 1251    Clinical Impression Statement Patient has met all short term goals at this time, still working to address long term goals.  Patient has custom fabricated splint for right hand which he can have put on and removed without discomfort.  Patient is playing more of an active role in simple self care activities, and has experienced improved functional mobility.     Pt will benefit from skilled therapeutic intervention in order to improve on the following deficits (Retired) Abnormal gait;Decreased activity tolerance;Decreased balance;Decreased cognition;Decreased mobility;Decreased range of motion;Decreased safety awareness;Decreased strength;Impaired UE functional use;Impaired tone;Pain   Rehab Potential Fair   Clinical Impairments Affecting Rehab Potential cognition, severity of defiicits   OT Frequency 2x / week   OT Duration 8 weeks   OT Treatment/Interventions Self-care/ADL training;Ultrasound;Traction;Moist Heat;Fluidtherapy;DME and/or AE instruction;Neuromuscular education;Therapeutic exercise;Functional Mobility Training;Manual Therapy;Passive range of motion;Splinting;Therapeutic activities;Balance training;Patient/family education   Plan Review long term goals, check how splint schedule / fit working.     OT Home Exercise Plan Education provided:  04/04/15 initiated PROM HEP, reinforced 03/05/2015; toilet transfer training and equipment recoomendations  04/23/2015.  Family indicates ROM program is being performed twice daily.    Consulted and Agree with Plan of Care Patient;Family member/caregiver   Family Member Consulted daughter          G-Codes - 05/15/2015 1256    Functional Limitation Self care   Self Care Current Status [phone]) At least 80 percent but less than 100 percent impaired, limited or restricted   Self Care Goal Status (U0454) At least 60 percent but  less than 80 percent impaired, limited or restricted      Problem List Patient Active Problem List   Diagnosis Date Noted  . Sepsis ([place]) 10/08/2014  . Acute encephalopathy 10/08/2014  . UTI (lower urinary tract infection) 10/08/2014  . Elevated troponin 10/08/2014  . Right hemiparesis ([place]) 07/05/2014  . Dysphagia, post-stroke 07/05/2014  . Dysarthria due to recent cerebral infarction 07/05/2014  . Left pontine stroke ([place]) 07/02/2014  . Embolic stroke involving left carotid artery ([place]) 06/28/2014  . Stroke ([place]) 06/28/2014  . CAP (community acquired pneumonia) 06/28/2014  . Normocytic anemia 06/28/2014  . Essential hypertension 06/28/2014  . Hypokalemia 06/28/2014   Occupational Therapy Progress Note  Dates of Reporting Period: 03/26/2015 to 04/30/2015  Objective Reports of Subjective Statement: Patient participating more in daily self care activities as evidenced by short term goal accomplishment.  Patient with improved functional mobility, even walking short distances in therapy, which has improved transfers, e.g. Toilet transfer.    Objective Measurements: Pain reduced with donning and doffing custom forearm based hand splint, as evidenced by no crying out, or angry statements when applying removing splint.    Goal Update: see above  Plan: see above  Reason Skilled Services are Required: see above - working toward long term goals / short term goals met.    Mariah Milling, OTR/L 04/30/2015, 12:58 PM  [facility] [address] [place], [place], [address] Phone: [phone]   Fax:  [phone]

## 2015-05-02 ENCOUNTER — Encounter: Payer: Self-pay | Admitting: Occupational Therapy

## 2015-05-02 ENCOUNTER — Ambulatory Visit: Payer: Medicare Other

## 2015-05-02 ENCOUNTER — Ambulatory Visit: Payer: Medicare Other | Admitting: Occupational Therapy

## 2015-05-02 DIAGNOSIS — Z7409 Other reduced mobility: Secondary | ICD-10-CM

## 2015-05-02 DIAGNOSIS — R2681 Unsteadiness on feet: Secondary | ICD-10-CM | POA: Diagnosis not present

## 2015-05-02 DIAGNOSIS — R269 Unspecified abnormalities of gait and mobility: Secondary | ICD-10-CM | POA: Diagnosis not present

## 2015-05-02 DIAGNOSIS — M79641 Pain in right hand: Secondary | ICD-10-CM

## 2015-05-02 DIAGNOSIS — M25511 Pain in right shoulder: Secondary | ICD-10-CM | POA: Diagnosis not present

## 2015-05-02 DIAGNOSIS — R4189 Other symptoms and signs involving cognitive functions and awareness: Secondary | ICD-10-CM

## 2015-05-02 DIAGNOSIS — M6289 Other specified disorders of muscle: Secondary | ICD-10-CM | POA: Diagnosis not present

## 2015-05-02 DIAGNOSIS — IMO0001 Reserved for inherently not codable concepts without codable children: Secondary | ICD-10-CM

## 2015-05-02 DIAGNOSIS — G8110 Spastic hemiplegia affecting unspecified side: Secondary | ICD-10-CM

## 2015-05-02 DIAGNOSIS — Z789 Other specified health status: Secondary | ICD-10-CM

## 2015-05-02 DIAGNOSIS — R531 Weakness: Secondary | ICD-10-CM

## 2015-05-02 NOTE — Patient Instructions (Signed)
We want to encourage [name] to do as much of his own bathing as possible - it will improve his endurance, his balance and his independence.  In the shower he can wash: 1. His face 2. His chest 3. His belly  4. His privates 5. The fronts of his legs to mid shin 6. His right arm (helper may need to hold his arm out to the side if it is tight but let him try first 7.  His left am (place washcloth on his lap and have him move his arm on the wash cloth vs the washcloth on his arm.  He can also use his left arm to wash his arm pit and the front of his shoulder  Helper should assist with: 1. His back 2. Washing his hair 3. His bottom 4. Back of his legs 5. His feet

## 2015-05-02 NOTE — Patient Instructions (Addendum)
  HIP: Hamstrings - Short Sitting   Rest right leg on raised surface. Keep knee straight. Lift chest. Hold _1-2 minutes. Repeat with left leg on stool. _2-3__ reps per set, _2-3__ sets per day, _7__ days per week  Copyright  VHI. All rights reserved.   Perform at kitchen sink with wheelchair behind you for safety:  Feet Apart, Varied Arm Positions - Eyes Open   With eyes open, feet shoulder width apart, left hand on sink, look straight ahead at a stationary object. Hold __1-2 minutes. Repeat _3___ times per session. Do _2-3___ sessions per day.   Dorsiflexion: Stretch - Heel Cord / Gastrocnemius   Position (A) Patient: Lie or sit with right leg straight. Helper: Cup right heel. Make sure grip is firm. Motion (B) - Helper uses forearm to apply pressure to entire sole of foot, stretching foot toward shin. CAUTION: Stretch should be felt in calf. Do not allow foot to twist. Hold _30-45__ seconds. Repeat _3__ times. Do ___ sessions per day. Variation: Contract method: Resist ___ seconds. (see card G.G.-14)  Butterfly, Supine   Caregiver will Lie on back, feet together. Lower one knee toward floor. Hold _30__ seconds. Bring back to the middle and then bring your other knee towards the floor. Hold for 30 seconds. Repeat _3__ times per session. Do _2__ sessions per day.  Copyright  VHI. All rights reserved.   Leg Raise: Straight - Hook-Lying (Single Leg)   Lying down, keep right leg straight, with arm support, then Lift straight leg. Repeat 10__ times per set. Repeat with other leg. Do _2_ sets per session. Do _4_ sessions per week.  http://tub.exer.us/187   Copyright  VHI. All rights reserved.   HIP / KNEE: Flexion, Heel Slides - Supine   Slide right heel up toward buttocks, keeping leg in straight line. _2__ reps per set, _10__ sets per day, _4__ days per week Use towel or pillowcase under heel as needed. Perform in seated position, with left leg and towel placed on floor  to reduce friction. Copyright  VHI. All rights reserved.   KNEE: Extension, Long Arc Quad (Band)  Sit up tall and straighten left leg. Hold _2__ seconds. Don't use a band. _10__ reps per set, _2__ sets per day, _4Bridging   Slowly raise buttocks from floor, keeping stomach tight. Repeat __10__ times per set. Do __1__ sets per session. Do __2__ sessions per day.  http://orth.exer.us/1096   Copyright  VHI. All rights reserved.  __ days per week  Axial Extension (Chin Tuck)   Gently pull chin in while lengthening back of neck, caregiver can place hand on back of his neck to encourage pt to push head into hand. Hold __2__ seconds while counting out loud. Repeat _10___ times. Do _2-3___ sessions per day.  http://gt2.exer.us/553   Copyright  VHI. All rights reserved.   Flexibility: Upper Trapezius Stretch   Caregiver will gently grasp right side of head and gently apply pressure to bring patient's left ear to left shoulder. Tilt head away until a gentle stretch is felt. Hold _30___ seconds. Repeat __3__ times per set. Do __1__ sets per session. Do _2-3___ sessions per day.  http://orth.exer.us/340   Copyright  VHI. All rights reserved.    Scapular Retraction: Elbow Flexion (Seated)   With elbows bent to 90, pinch shoulder blades together, keeping elbows bent. Repeat _10___ times per set. Do __3__ sets per session. Do __1__ sessions per day.  http://orth.exer.us/948

## 2015-05-02 NOTE — Therapy (Signed)
[facility] [address] [place], [place], [address] Phone: [phone]   Fax:  [phone]  Physical Therapy Treatment  Patient Details  Name: [name] MRN: [id number] Date of Birth: [birth year] Referring Provider:  Gaynelle Arabian, MD  Encounter Date: 05/02/2015      PT End of Session - 05/02/15 1212    Visit Number 11   Number of Visits 17   Date for PT Re-Evaluation 05/25/15   Authorization Type G-code every 10th visit   PT Start Time 1015   PT Stop Time 1100   PT Time Calculation (min) 45 min   Equipment Utilized During Treatment Gait belt   Activity Tolerance Patient tolerated treatment well   Behavior During Therapy [facility] for tasks assessed/performed      Past Medical History  Diagnosis Date  . Hypertension   . Cancer ([place])   . Stroke [facility])     Past Surgical History  Procedure Laterality Date  . Appendectomy    . Kidney surgery      There were no vitals filed for this visit.  Visit Diagnosis:  Right sided weakness  Abnormality of gait      Subjective Assessment - 05/02/15 1018    Subjective Pt denied falls or changes since last visit.   Patient is accompained by: Family member  caregiver-[name]   Pertinent History HTN, hx of CA,    Patient Stated Goals I want to get back to normal.    Currently in Pain? No/denies          Therex: Pt and pt's caregiver performed supine HEP with cues and demonstration for technique. PT also re-printed HEP per pt request, with total number of reps/hold time. Please see pt instructions for details.               [facility] Adult PT Treatment/Exercise - 05/02/15 0001    Bed Mobility   Bed Mobility Left Sidelying to Sit   Left Sidelying to Sit 4: Min guard;HOB flat  cues for sequencing and min guard for safety.   Transfers   Games developer Transfers   Stand Pivot Transfers --   Squat Pivot Transfers 4: Min assist   Squat Pivot  Transfer Details (indicate cue type and reason) Cues for sequencing.                PT Education - 05/02/15 1209    Education provided Yes   Education Details PT re-educated pt and caregiver on proper technique and reps/holds during supine HEP. PT spent increased time during session reiterating the importance of performing HEP as prescribed, as pt continually asked for a total number or reps/time for holds for exercises.   Person(s) Educated Patient;Caregiver(s)   Methods Explanation;Demonstration;Tactile cues;Verbal cues;Handout   Comprehension Returned demonstration;Verbalized understanding;Need further instruction          PT Short Term Goals - 04/30/15 1335    PT SHORT TERM GOAL #1   Title Pt will be ind. in performing with caregiver assist in order to improve strength, flexibility, and balance. Target date: 04/23/2015   Status Partially Met   PT SHORT TERM GOAL #2   Title Perform BERG, TUG, and gait speed when pt able to stand/amb. for longer durations (if appropriate). Target date: 04/23/15   Status Partially Met   PT SHORT TERM GOAL #3   Title Pt will stand with no UE and min guard for 1 minute in order to perform ADLs. Target date: 04/23/15.  Status Partially Met   PT SHORT TERM GOAL #4   Title Pt will ambulate 55' with LRAD and min A to improve functional mobiltiy. Target date: 04/23/15.   Status Partially Met           PT Long Term Goals - 04/26/15 1124    PT LONG TERM GOAL #1   Title Pt will verbalize understanding of CVA risk factors/sign/symptoms to decrease risk of another CVA. Target date: 05/21/15.   Status On-going   PT LONG TERM GOAL #2   Title Pt will ambulate 4' with min guard and LRAD to improve functional mobility. Target date: 05/21/15.   Status On-going   PT LONG TERM GOAL #3   Title Pt will reach 5" outside BOS with supervision, while standing with no UE support, to perform ADLs. Target date: 05/21/15.   Status On-going   PT LONG TERM GOAL #4    Title Pt will propel manual w/c 150' with L UE/L LE with supervision to improve functional mobility. Target date: 05/21/15.   Status On-going               Plan - 05/02/15 1215    Clinical Impression Statement Pt demonstrating progress as he was able to perform L sidelying to sit transfer with min guard and cues for technique. Pt continues to require education on the importance of performing HEP and to allow for sustained 30-45 second holds during HEP vs. 1-2 seconds, as pt's caregiver reports pt does not allow for sustained holds at home. Continue with POC.   Pt will benefit from skilled therapeutic intervention in order to improve on the following deficits Abnormal gait;Decreased endurance;Decreased activity tolerance;Decreased balance;Decreased knowledge of use of DME;Decreased strength;Impaired UE functional use;Decreased cognition;Decreased coordination;Decreased safety awareness;Impaired flexibility;Postural dysfunction;Decreased range of motion;Decreased mobility   Rehab Potential Fair   Clinical Impairments Affecting Rehab Potential impaired cognition and B knee contractures   PT Frequency 2x / week   PT Duration 8 weeks   PT Treatment/Interventions ADLs/Self Care Home Management;Neuromuscular re-education;Biofeedback;Electrical Stimulation;Therapeutic activities;Therapeutic exercise;Balance training;Manual techniques;Vestibular;Wheelchair mobility training;Orthotic Fit/Training;Functional mobility training;Patient/family education;Gait training;Stair training;DME Instruction   PT Next Visit Plan Gait training with hemi-walker.   PT Home Exercise Plan Stretch, strengthening, and balance HEP   Consulted and Agree with Plan of Care Patient;Other (Comment)   Family Member Consulted caregiver        Problem List Patient Active Problem List   Diagnosis Date Noted  . Sepsis ([place]) 10/08/2014  . Acute encephalopathy 10/08/2014  . UTI (lower urinary tract infection) 10/08/2014  .  Elevated troponin 10/08/2014  . Right hemiparesis ([place]) 07/05/2014  . Dysphagia, post-stroke 07/05/2014  . Dysarthria due to recent cerebral infarction 07/05/2014  . Left pontine stroke ([place]) 07/02/2014  . Embolic stroke involving left carotid artery ([place]) 06/28/2014  . Stroke ([place]) 06/28/2014  . CAP (community acquired pneumonia) 06/28/2014  . Normocytic anemia 06/28/2014  . Essential hypertension 06/28/2014  . Hypokalemia 06/28/2014    Ishana Blades L 05/02/2015, 12:17 PM  [facility] [address] [place], [place], [address] Phone: [phone]   Fax:  [phone]     Geoffry Paradise, PT,DPT 05/02/2015 12:17 PM Phone: [phone] Fax: [phone]

## 2015-05-02 NOTE — Therapy (Signed)
[facility] [address] [place], [place], [address] Phone: [phone]   Fax:  [phone]  Occupational Therapy Treatment  Patient Details  Name: [name] MRN: [id number] Date of Birth: 12-01-1925 Referring Provider:  Gaynelle Arabian, MD  Encounter Date: 05/02/2015      OT End of Session - 05/02/15 1200    Visit Number 11   Number of Visits 16   Date for OT Re-Evaluation 05/21/15   Authorization Type medicare will need g code and progress note every 10th visit   Authorization Time Period 05/21/2015   Authorization - Visit Number 11   Authorization - Number of Visits 20   OT Start Time 1101   OT Stop Time 1142   OT Time Calculation (min) 41 min   Activity Tolerance Patient tolerated treatment well      Past Medical History  Diagnosis Date  . Hypertension   . Cancer ([place])   . Stroke [facility])     Past Surgical History  Procedure Laterality Date  . Appendectomy    . Kidney surgery      There were no vitals filed for this visit.  Visit Diagnosis:  Pain, hand joint, right  Spastic hemiplegia affecting nondominant side (HCC)  Impaired functional mobility and activity tolerance  Impaired cognition  Impaired activities of daily living  Pain in joint, shoulder region, right      Subjective Assessment - 05/02/15 1105    Subjective  This thing for my hand doesn't even hurt   Patient is accompained by: --  PCA   Pertinent History L CVA, small vessel disease and atrophy, see epic snapshot   Patient Stated Goals I just want to get better   Currently in Pain? No/denies                      OT Treatments/Exercises (OP) - 05/02/15 0001    ADLs   Bathing Practiced simulated bathing today  - PCA observed and then able to verbalize understanding. Stressed need for consistency in order for the pt to be able to carry over strategies. Written instructions provided - see pt instruction  section for details. Pt asked to practice daily at home over the weekend. Pt in agreement.    ADL Comments Assessed wheelchair positioning in current chair. Instructed PCA on how to position pt all the way back in wheelchair with level pelvis. Added towel for lumbar support and 45* seat belt (temporary made with splint straps) for pt to trail this weekend. If pt able to tolerate will recommend installation of seat belt. Pt in agreement with plan.                  OT Short Term Goals - 05/02/15 1145    OT SHORT TERM GOAL #1   Title Pt ,family and PCA will be mod I with HEP - 04/23/2015   Status Achieved   OT SHORT TERM GOAL #2   Title Pt will tolerate new splint to assist with spasticity and ROM per wearing schedule   Status Achieved  splint finalized 10/4 - tolerates donning and doffing.  reassess next visit   OT SHORT TERM GOAL #3   Title Wife and PCA will demonstrate understanding of wear/care of splint as well as wearing schedule.    Status Achieved  daughter and caregiver   OT SHORT TERM GOAL #4   Title Pt will be supervision with simple grooming activities (vc's to intiate)  Status Achieved   OT SHORT TERM GOAL #5   Title Pt will be max for UB bathing   Status Achieved   OT SHORT TERM GOAL #6   Title Pt will be mod a for toilet transfers   Status Achieved           OT Long Term Goals - 05/02/15 1146    OT LONG TERM GOAL #1   Title Pt, wife and PCA will be mod I with upgraded HEP -05/21/2015   Status On-going   OT LONG TERM GOAL #2   Title Pt will be mod a for UB bathing   Status On-going   OT LONG TERM GOAL #3   Title Pt will be mod a for LB bathing   Status On-going   OT LONG TERM GOAL #4   Title Pt will be min a for standing balance to allow PCA to hike pants using grab bar.   Status On-going   OT LONG TERM GOAL #5   Title Pt will demonstrate improved PROM in RUE to ease in caregiver's ability to complete UB ADL's   Status On-going   OT LONG TERM GOAL  #6   Title Pt will be min a for toilet transfers   Status On-going   OT LONG TERM GOAL #7   Title Pt will be mod a for shower transfers   Status On-going               Plan - 05/02/15 1153    Clinical Impression Statement Pt making slow steady progress toward goals - pt can become agitated however is  usually easily redirected. Pt  does not tolerate change well but responds to incorporating him into the decisions.    Pt will benefit from skilled therapeutic intervention in order to improve on the following deficits (Retired) Abnormal gait;Decreased activity tolerance;Decreased balance;Decreased cognition;Decreased mobility;Decreased range of motion;Decreased safety awareness;Decreased strength;Impaired UE functional use;Impaired tone;Pain   Rehab Potential Fair   Clinical Impairments Affecting Rehab Potential cognition, severity of defiicits   OT Frequency 2x / week   OT Duration 8 weeks   OT Treatment/Interventions Self-care/ADL training;Ultrasound;Traction;Moist Heat;Fluidtherapy;DME and/or AE instruction;Neuromuscular education;Therapeutic exercise;Functional Mobility Training;Manual Therapy;Passive range of motion;Splinting;Therapeutic activities;Balance training;Patient/family education   Plan check wheelchair posittioning; if wife present discuss transfer aid options for shower and bed   OT Home Exercise Plan Education provided:  04/04/15 initiated PROM HEP, reinforced 03/05/2015; toilet transfer training and equipment recoomendations 04/23/2015.  Family indicates ROM program is being performed twice daily.    Consulted and Agree with Plan of Care Patient;Family member/caregiver   Family Member Consulted PCA        Problem List Patient Active Problem List   Diagnosis Date Noted  . Sepsis ([place]) 10/08/2014  . Acute encephalopathy 10/08/2014  . UTI (lower urinary tract infection) 10/08/2014  . Elevated troponin 10/08/2014  . Right hemiparesis ([place]) 07/05/2014  . Dysphagia,  post-stroke 07/05/2014  . Dysarthria due to recent cerebral infarction 07/05/2014  . Left pontine stroke ([place]) 07/02/2014  . Embolic stroke involving left carotid artery ([place]) 06/28/2014  . Stroke ([place]) 06/28/2014  . CAP (community acquired pneumonia) 06/28/2014  . Normocytic anemia 06/28/2014  . Essential hypertension 06/28/2014  . Hypokalemia 06/28/2014    Quay Burow, OTR/L 05/02/2015, 12:02 PM  [facility] [address] [place], [place], [address] Phone: [phone]   Fax:  [phone]

## 2015-05-06 DIAGNOSIS — Z85828 Personal history of other malignant neoplasm of skin: Secondary | ICD-10-CM | POA: Diagnosis not present

## 2015-05-06 DIAGNOSIS — L218 Other seborrheic dermatitis: Secondary | ICD-10-CM | POA: Diagnosis not present

## 2015-05-06 DIAGNOSIS — C44719 Basal cell carcinoma of skin of left lower limb, including hip: Secondary | ICD-10-CM | POA: Diagnosis not present

## 2015-05-07 ENCOUNTER — Ambulatory Visit: Payer: Medicare Other

## 2015-05-07 ENCOUNTER — Ambulatory Visit: Payer: Medicare Other | Admitting: Occupational Therapy

## 2015-05-07 ENCOUNTER — Encounter: Payer: Self-pay | Admitting: Occupational Therapy

## 2015-05-07 DIAGNOSIS — M79641 Pain in right hand: Secondary | ICD-10-CM | POA: Diagnosis not present

## 2015-05-07 DIAGNOSIS — Z7409 Other reduced mobility: Secondary | ICD-10-CM

## 2015-05-07 DIAGNOSIS — R2681 Unsteadiness on feet: Secondary | ICD-10-CM | POA: Diagnosis not present

## 2015-05-07 DIAGNOSIS — M25511 Pain in right shoulder: Secondary | ICD-10-CM | POA: Diagnosis not present

## 2015-05-07 DIAGNOSIS — R4189 Other symptoms and signs involving cognitive functions and awareness: Secondary | ICD-10-CM

## 2015-05-07 DIAGNOSIS — R269 Unspecified abnormalities of gait and mobility: Secondary | ICD-10-CM | POA: Diagnosis not present

## 2015-05-07 DIAGNOSIS — Z789 Other specified health status: Secondary | ICD-10-CM

## 2015-05-07 DIAGNOSIS — G8110 Spastic hemiplegia affecting unspecified side: Secondary | ICD-10-CM

## 2015-05-07 DIAGNOSIS — IMO0001 Reserved for inherently not codable concepts without codable children: Secondary | ICD-10-CM

## 2015-05-07 DIAGNOSIS — M6289 Other specified disorders of muscle: Secondary | ICD-10-CM | POA: Diagnosis not present

## 2015-05-07 NOTE — Therapy (Signed)
[facility] [address] [place], [place], [address] Phone: [phone]   Fax:  [phone]  Occupational Therapy Treatment  Patient Details  Name: [name] MRN: [id number] Date of Birth: [birth year] Referring Provider:  Gaynelle Arabian, MD  Encounter Date: 05/07/2015      OT End of Session - 05/07/15 1314    Visit Number 12   Number of Visits 16   Date for OT Re-Evaluation 05/21/15   Authorization Type medicare will need g code and progress note every 10th visit   Authorization Time Period 05/21/2015   Authorization - Visit Number 12   Authorization - Number of Visits 20   OT Start Time 1103   OT Stop Time 1145   OT Time Calculation (min) 42 min   Activity Tolerance Patient tolerated treatment well      Past Medical History  Diagnosis Date  . Hypertension   . Cancer ([place])   . Stroke [facility])     Past Surgical History  Procedure Laterality Date  . Appendectomy    . Kidney surgery      There were no vitals filed for this visit.  Visit Diagnosis:  Pain, hand joint, right  Spastic hemiplegia affecting nondominant side (HCC)  Impaired functional mobility and activity tolerance  Impaired cognition  Impaired activities of daily living  Pain in joint, shoulder region, right      Subjective Assessment - 05/07/15 1106    Subjective  I didn't realize it but I guess I am a little weak on my right side.    Pertinent History L CVA, small vessel disease and atrophy, see epic snapshot   Patient Stated Goals I just want to get better   Currently in Pain? No/denies                      OT Treatments/Exercises (OP) - 05/07/15 0001    ADLs   Functional Mobility Wife present today for session with PCA. Discussed wheelchair positioning. PCA reports pt tolerated temporary seatbelt well and positioning much better in wc. Recommended wife call vendor and ask for 45 degree angle seat belt to be  installed as well as purchase lumbar cushion to assist in maintaining pelvis in more neutral position.  Also discussed bed and shower transfers. Discussed options as pt has nothing to hold on to for transfers. Discussed that if pt has external support he is able to stand with very little assistance and transfer with min a. After much discussion, pt and wife decided on transfer pole for bed transfers and installation of additional grab bar in roll in shower. PCA also in agreement with plan.  Wife to obtain equipment via vendor she is currently using.  Adjusted splint schedule - pt to now wear modified resting hand splint at night and two hours in afternoon and will wear blue elbow soft splint during day when in wheelchair. Wife stated she feels like she is "adrift medically " as she feels she has no physician willing to guide her. Recommended she re- establish relationship with physiatrist (Dr Letta Pate who is already familiar with pt). Wife in agreement and will follow up.                   OT Short Term Goals - 05/07/15 1312    OT SHORT TERM GOAL #1   Title Pt ,family and PCA will be mod I with HEP - 04/23/2015   Status Achieved  OT SHORT TERM GOAL #2   Title Pt will tolerate new splint to assist with spasticity and ROM per wearing schedule   Status Achieved  splint finalized 10/4 - tolerates donning and doffing.  reassess next visit   OT SHORT TERM GOAL #3   Title Wife and PCA will demonstrate understanding of wear/care of splint as well as wearing schedule.    Status Achieved  daughter and caregiver   OT SHORT TERM GOAL #4   Title Pt will be supervision with simple grooming activities (vc's to intiate)   Status Achieved   OT SHORT TERM GOAL #5   Title Pt will be max for UB bathing   Status Achieved   OT SHORT TERM GOAL #6   Title Pt will be mod a for toilet transfers   Status Achieved           OT Long Term Goals - 05/07/15 1313    OT LONG TERM GOAL #1   Title Pt, wife  and PCA will be mod I with upgraded HEP -05/21/2015   Status On-going   OT LONG TERM GOAL #2   Title Pt will be mod a for UB bathing   Status On-going   OT LONG TERM GOAL #3   Title Pt will be mod a for LB bathing   Status On-going   OT LONG TERM GOAL #4   Title Pt will be min a for standing balance to allow PCA to hike pants using grab bar.   Status Achieved   OT LONG TERM GOAL #5   Title Pt will demonstrate improved PROM in RUE to ease in caregiver's ability to complete UB ADL's   Status On-going   OT LONG TERM GOAL #6   Title Pt will be min a for toilet transfers   Status Achieved   OT LONG TERM GOAL #7   Title Pt will be mod a for shower transfers   Status On-going               Plan - 05/07/15 1313    Clinical Impression Statement Pt has met all STG's and 2 LTG's at this point. Wife present today and agreeable to suggestions for wheelchair positioning and transfers.    Pt will benefit from skilled therapeutic intervention in order to improve on the following deficits (Retired) Abnormal gait;Decreased activity tolerance;Decreased balance;Decreased cognition;Decreased mobility;Decreased range of motion;Decreased safety awareness;Decreased strength;Impaired UE functional use;Impaired tone;Pain   Clinical Impairments Affecting Rehab Potential cognition, severity of defiicits   OT Frequency 2x / week   OT Duration 8 weeks   OT Treatment/Interventions Self-care/ADL training;Ultrasound;Traction;Moist Heat;Fluidtherapy;DME and/or AE instruction;Neuromuscular education;Therapeutic exercise;Functional Mobility Training;Manual Therapy;Passive range of motion;Splinting;Therapeutic activities;Balance training;Patient/family education   Plan address bathing, shower transfers.    OT Home Exercise Plan Education provided:  04/04/15 initiated PROM HEP, reinforced 03/05/2015; toilet transfer training and equipment recoomendations 04/23/2015.  Family indicates ROM program is being performed twice  daily.    Consulted and Agree with Plan of Care Patient;Family member/caregiver   Family Member Consulted PCA, wife        Problem List Patient Active Problem List   Diagnosis Date Noted  . Sepsis ([place]) 10/08/2014  . Acute encephalopathy 10/08/2014  . UTI (lower urinary tract infection) 10/08/2014  . Elevated troponin 10/08/2014  . Right hemiparesis ([place]) 07/05/2014  . Dysphagia, post-stroke 07/05/2014  . Dysarthria due to recent cerebral infarction 07/05/2014  . Left pontine stroke ([place]) 07/02/2014  . Embolic stroke involving left carotid artery ([place])  06/28/2014  . Stroke ([place]) 06/28/2014  . CAP (community acquired pneumonia) 06/28/2014  . Normocytic anemia 06/28/2014  . Essential hypertension 06/28/2014  . Hypokalemia 06/28/2014    Quay Burow, OTR/L 05/07/2015, 1:16 PM  [facility] [address] [place], [place], [address] Phone: [phone]   Fax:  [phone]

## 2015-05-08 NOTE — Therapy (Signed)
[facility] [address] [place], [place], [address] Phone: [phone]   Fax:  [phone]  Physical Therapy Treatment  Patient Details  Name: [name] MRN: [id number] Date of Birth: August 29, 1925 Referring Provider:  Gaynelle Arabian, MD  Encounter Date: 05/07/2015      PT End of Session - 05/08/15 0948    Visit Number 12   Number of Visits 17   Date for PT Re-Evaluation 05/25/15   Authorization Type G-code every 10th visit   PT Start Time 1024  pt arrived late   PT Stop Time 1103   PT Time Calculation (min) 39 min   Equipment Utilized During Treatment Gait belt   Activity Tolerance Patient tolerated treatment well   Behavior During Therapy [facility] for tasks assessed/performed      Past Medical History  Diagnosis Date  . Hypertension   . Cancer ([place])   . Stroke [facility])     Past Surgical History  Procedure Laterality Date  . Appendectomy    . Kidney surgery      There were no vitals filed for this visit.  Visit Diagnosis:  Abnormality of gait      Subjective Assessment - 05/07/15 1026    Subjective Pt denied falls or changes since last visit.   Patient is accompained by: Family member  caregive and wife   Pertinent History HTN, hx of CA,    Patient Stated Goals I want to get back to normal.    Currently in Pain? No/denies                         [facility] Adult PT Treatment/Exercise - 05/07/15 1027    Ambulation/Gait   Ambulation/Gait Yes   Ambulation/Gait Assistance 4: Min assist;3: Mod assist;2: Max assist   Ambulation/Gait Assistance Details Attempted gait with hemi-walker outside of bars, but pt was too unsteady. Pt amb. in // bars with PT and PT tech, in order to guard pt and assist in lateral wt. shifting to the R LE and to prevent R knee from buckling. Pt used L hand on bar for support. Performed 7'x10, with w/c follow. Cues to improve upright posture, lateral wt. shifting onto  R LE, improve narrow BOS. Pt required rest breaks after each trial of amb. 2/2 fatigue. Pt progressed to requiring less support.   Ambulation Distance (Feet) --  7'x10.   Assistive device Hemi-walker;Parallel bars   Gait Pattern Step-to pattern;Decreased dorsiflexion - right;Right flexed knee in stance;Narrow base of support;Trunk flexed   Ambulation Surface Level;Indoor                PT Education - 05/08/15 [phone]    Education provided Yes   Education Details PT reiterated the importance of wearing R KI at night, as pt's caregiver [name]) reported pt has been taking it off at night vs. wearing it the full 8 hours, this could be why amb. (knee ext) was more difficult today.   Person(s) Educated Patient;Spouse;Caregiver(s)   Methods Explanation   Comprehension Verbalized understanding          PT Short Term Goals - 04/30/15 1335    PT SHORT TERM GOAL #1   Title Pt will be ind. in performing with caregiver assist in order to improve strength, flexibility, and balance. Target date: 04/23/2015   Status Partially Met   PT SHORT TERM GOAL #2   Title Perform BERG, TUG, and gait speed when pt able to  stand/amb. for longer durations (if appropriate). Target date: 04/23/15   Status Partially Met   PT SHORT TERM GOAL #3   Title Pt will stand with no UE and min guard for 1 minute in order to perform ADLs. Target date: 04/23/15.   Status Partially Met   PT SHORT TERM GOAL #4   Title Pt will ambulate 38' with LRAD and min A to improve functional mobiltiy. Target date: 04/23/15.   Status Partially Met           PT Long Term Goals - 04/26/15 1124    PT LONG TERM GOAL #1   Title Pt will verbalize understanding of CVA risk factors/sign/symptoms to decrease risk of another CVA. Target date: 05/21/15.   Status On-going   PT LONG TERM GOAL #2   Title Pt will ambulate 82' with min guard and LRAD to improve functional mobility. Target date: 05/21/15.   Status On-going   PT LONG TERM GOAL  #3   Title Pt will reach 5" outside BOS with supervision, while standing with no UE support, to perform ADLs. Target date: 05/21/15.   Status On-going   PT LONG TERM GOAL #4   Title Pt will propel manual w/c 150' with L UE/L LE with supervision to improve functional mobility. Target date: 05/21/15.   Status On-going               Plan - 05/08/15 0949    Clinical Impression Statement Pt had difficulty when first attempting amb today, R knee ext was especially difficult. This could be due to pt not wearing R KI throughout the night, as MD prescribed to reduce R knee flexion contracture. Pt did progress from max A to min-mod A during amb. in // bars, which improve wt. shifting and upright posture. Continue with POC.   Pt will benefit from skilled therapeutic intervention in order to improve on the following deficits Abnormal gait;Decreased endurance;Decreased activity tolerance;Decreased balance;Decreased knowledge of use of DME;Decreased strength;Impaired UE functional use;Decreased cognition;Decreased coordination;Decreased safety awareness;Impaired flexibility;Postural dysfunction;Decreased range of motion;Decreased mobility   Rehab Potential Fair   Clinical Impairments Affecting Rehab Potential impaired cognition and B knee contractures   PT Frequency 2x / week   PT Duration 8 weeks   PT Treatment/Interventions ADLs/Self Care Home Management;Neuromuscular re-education;Biofeedback;Electrical Stimulation;Therapeutic activities;Therapeutic exercise;Balance training;Manual techniques;Vestibular;Wheelchair mobility training;Orthotic Fit/Training;Functional mobility training;Patient/family education;Gait training;Stair training;DME Instruction   PT Next Visit Plan Gait training with hemi-walker, standing balance activities (reaching outside BOS).   PT Home Exercise Plan Stretch, strengthening, and balance HEP   Consulted and Agree with Plan of Care Patient;Other (Comment);Family member/caregiver    Family Member Consulted caregiver, [name] and wife, Herbert Pun.        Problem List Patient Active Problem List   Diagnosis Date Noted  . Sepsis ([place]) 10/08/2014  . Acute encephalopathy 10/08/2014  . UTI (lower urinary tract infection) 10/08/2014  . Elevated troponin 10/08/2014  . Right hemiparesis ([place]) 07/05/2014  . Dysphagia, post-stroke 07/05/2014  . Dysarthria due to recent cerebral infarction 07/05/2014  . Left pontine stroke ([place]) 07/02/2014  . Embolic stroke involving left carotid artery ([place]) 06/28/2014  . Stroke ([place]) 06/28/2014  . CAP (community acquired pneumonia) 06/28/2014  . Normocytic anemia 06/28/2014  . Essential hypertension 06/28/2014  . Hypokalemia 06/28/2014    Myliah Medel L 05/08/2015, 9:51 AM  [facility] [address] [place], [place], [address] Phone: [phone]   Fax:  [phone]     Geoffry Paradise, PT,DPT 05/08/2015 9:51  AM Phone: [phone] Fax: [phone]

## 2015-05-10 ENCOUNTER — Ambulatory Visit: Payer: Medicare Other

## 2015-05-10 ENCOUNTER — Ambulatory Visit: Payer: Medicare Other | Admitting: *Deleted

## 2015-05-10 DIAGNOSIS — M79641 Pain in right hand: Secondary | ICD-10-CM | POA: Diagnosis not present

## 2015-05-10 DIAGNOSIS — Z7409 Other reduced mobility: Secondary | ICD-10-CM

## 2015-05-10 DIAGNOSIS — IMO0001 Reserved for inherently not codable concepts without codable children: Secondary | ICD-10-CM

## 2015-05-10 DIAGNOSIS — Z789 Other specified health status: Secondary | ICD-10-CM

## 2015-05-10 DIAGNOSIS — G8110 Spastic hemiplegia affecting unspecified side: Secondary | ICD-10-CM

## 2015-05-10 DIAGNOSIS — R269 Unspecified abnormalities of gait and mobility: Secondary | ICD-10-CM

## 2015-05-10 DIAGNOSIS — R4189 Other symptoms and signs involving cognitive functions and awareness: Secondary | ICD-10-CM

## 2015-05-10 DIAGNOSIS — R2681 Unsteadiness on feet: Secondary | ICD-10-CM

## 2015-05-10 DIAGNOSIS — M25511 Pain in right shoulder: Secondary | ICD-10-CM | POA: Diagnosis not present

## 2015-05-10 DIAGNOSIS — M6289 Other specified disorders of muscle: Secondary | ICD-10-CM | POA: Diagnosis not present

## 2015-05-10 NOTE — Therapy (Signed)
[facility] [address] [place], [place], [address] Phone: [phone]   Fax:  [phone]  Physical Therapy Treatment  Patient Details  Name: [name] MRN: [id number] Date of Birth: 1926/01/24 No Data Recorded  Encounter Date: 05/10/2015      PT End of Session - 05/10/15 1112    Visit Number 13   Number of Visits 17   Date for PT Re-Evaluation 05/25/15   Authorization Type G-code every 10th visit   PT Start Time 1019   PT Stop Time 1100   PT Time Calculation (min) 41 min   Equipment Utilized During Treatment Gait belt   Activity Tolerance Patient tolerated treatment well   Behavior During Therapy [facility] for tasks assessed/performed      Past Medical History  Diagnosis Date  . Hypertension   . Cancer ([place])   . Stroke [facility])     Past Surgical History  Procedure Laterality Date  . Appendectomy    . Kidney surgery      There were no vitals filed for this visit.  Visit Diagnosis:  Unsteadiness  Abnormality of gait      Subjective Assessment - 05/10/15 1021    Subjective Pt denied falls or changes since last visit. Pt reported he as kept his R KI donned the entire night since last visit.   Patient is accompained by: --  caregiver-Lorraine   Pertinent History HTN, hx of CA,    Patient Stated Goals I want to get back to normal.    Currently in Pain? No/denies                         [facility] Adult PT Treatment/Exercise - 05/10/15 1110    Transfers   Transfers Sit to Stand;Stand to Sit   Sit to Stand 3: Mod assist;4: Min assist   Sit to Stand Details Tactile cues for initiation;Tactile cues for sequencing;Tactile cues for weight shifting;Tactile cues for placement;Verbal cues for sequencing;Verbal cues for technique   Sit to Stand Details (indicate cue type and reason) Pt progressed from mod A to min A during sit to stand, cues to place hand on w/c vs. // bar to incr. IND.  Performed  x7 times   Stand to Sit 4: Min guard   Stand to Sit Details (indicate cue type and reason) Other (comment)  cues to improve eccentric control   Stand to Sit Details Performed x7 reps.             Balance Exercises - 05/10/15 1107    Balance Exercises: Standing   Other Standing Exercises Pt performed standing at counter and in // bars with 1 UE with feet apart and progressed from mod A to min A (and min guard) during static standing with 0-1 UE support. Extensive cues to improve ant. wt. shifting. Pt performed overhead reaching 3x5 reps, lateral reaches for cones and placing back on table or handing to caregiver to improve trunk rotation: 2x6cones. Pt performed x7 static standing reps with progression to min guard. Pt required seated rest breaks 2/2 fatigue.            PT Education - 05/10/15 1111    Education provided Yes   Education Details Progressed balance HEP.   Person(s) Educated Patient;Caregiver(s)   Methods Explanation;Demonstration;Handout;Verbal cues;Tactile cues   Comprehension Verbalized understanding;Returned demonstration;Need further instruction          PT Short Term Goals - 04/30/15 1335  PT SHORT TERM GOAL #1   Title Pt will be ind. in performing with caregiver assist in order to improve strength, flexibility, and balance. Target date: 04/23/2015   Status Partially Met   PT SHORT TERM GOAL #2   Title Perform BERG, TUG, and gait speed when pt able to stand/amb. for longer durations (if appropriate). Target date: 04/23/15   Status Partially Met   PT SHORT TERM GOAL #3   Title Pt will stand with no UE and min guard for 1 minute in order to perform ADLs. Target date: 04/23/15.   Status Partially Met   PT SHORT TERM GOAL #4   Title Pt will ambulate 68' with LRAD and min A to improve functional mobiltiy. Target date: 04/23/15.   Status Partially Met           PT Long Term Goals - 04/26/15 1124    PT LONG TERM GOAL #1   Title Pt will verbalize  understanding of CVA risk factors/sign/symptoms to decrease risk of another CVA. Target date: 05/21/15.   Status On-going   PT LONG TERM GOAL #2   Title Pt will ambulate 53' with min guard and LRAD to improve functional mobility. Target date: 05/21/15.   Status On-going   PT LONG TERM GOAL #3   Title Pt will reach 5" outside BOS with supervision, while standing with no UE support, to perform ADLs. Target date: 05/21/15.   Status On-going   PT LONG TERM GOAL #4   Title Pt will propel manual w/c 150' with L UE/L LE with supervision to improve functional mobility. Target date: 05/21/15.   Status On-going               Plan - 05/10/15 1112    Clinical Impression Statement Pt demonstrated progress as he was able to tolerate standing for longer periods of time and was able to reach outside BOS with assist. Pt required less assist with increased practice and cuing to improve ant. wt. shifing. Pt would continue to benefit from skilled PT to improve safety during functional mobility.   Pt will benefit from skilled therapeutic intervention in order to improve on the following deficits Abnormal gait;Decreased endurance;Decreased activity tolerance;Decreased balance;Decreased knowledge of use of DME;Decreased strength;Impaired UE functional use;Decreased cognition;Decreased coordination;Decreased safety awareness;Impaired flexibility;Postural dysfunction;Decreased range of motion;Decreased mobility   Rehab Potential Fair   Clinical Impairments Affecting Rehab Potential impaired cognition and B knee contractures   PT Frequency 2x / week   PT Duration 8 weeks   PT Treatment/Interventions ADLs/Self Care Home Management;Neuromuscular re-education;Biofeedback;Electrical Stimulation;Therapeutic activities;Therapeutic exercise;Balance training;Manual techniques;Vestibular;Wheelchair mobility training;Orthotic Fit/Training;Functional mobility training;Patient/family education;Gait training;Stair training;DME  Instruction   PT Next Visit Plan Gait training with hemi-walker, standing balance activities (reaching outside BOS).   PT Home Exercise Plan Stretch, strengthening, and balance HEP   Consulted and Agree with Plan of Care Patient;Family member/caregiver   Family Member Consulted caregiver, [name]        Problem List Patient Active Problem List   Diagnosis Date Noted  . Sepsis ([place]) 10/08/2014  . Acute encephalopathy 10/08/2014  . UTI (lower urinary tract infection) 10/08/2014  . Elevated troponin 10/08/2014  . Right hemiparesis ([place]) 07/05/2014  . Dysphagia, post-stroke 07/05/2014  . Dysarthria due to recent cerebral infarction 07/05/2014  . Left pontine stroke ([place]) 07/02/2014  . Embolic stroke involving left carotid artery ([place]) 06/28/2014  . Stroke ([place]) 06/28/2014  . CAP (community acquired pneumonia) 06/28/2014  . Normocytic anemia 06/28/2014  . Essential hypertension 06/28/2014  .  Hypokalemia 06/28/2014    Damarius Karnes L 05/10/2015, 11:14 AM  [facility] [address] [place], [place], [address] Phone: [phone]   Fax:  [phone]  Name: [name] MRN: [id number] Date of Birth: [birth year]    Geoffry Paradise, PT,DPT 05/10/2015 11:14 AM Phone: [phone] Fax: [phone]

## 2015-05-10 NOTE — Patient Instructions (Addendum)
Perform at kitchen sink with a caregiver to guard/assist patient for safety.  Feet Apart, Varied Arm Positions - Eyes Open    With eyes open, feet shoulder width apart, Left hand on sink, look straight ahead at a stationary object (plastic cup). Make sure you stay forward and then reach for a plastic cup on counter and place it overhead in a cabinet. Repeat __5__ times per session. Do __2__ sessions per day.  Copyright  VHI. All rights reserved.

## 2015-05-10 NOTE — Therapy (Signed)
[facility] [address] [place], [place], [address] Phone: [phone]   Fax:  [phone]  Occupational Therapy Treatment  Patient Details  Name: [name] MRN: [id number] Date of Birth: 09-Sep-1925 No Data Recorded  Encounter Date: 05/10/2015      OT End of Session - 05/10/15 1201    Visit Number 13   Number of Visits 16   Date for OT Re-Evaluation 05/21/15   Authorization Type medicare will need g code and progress note every 10th visit   Authorization Time Period 05/21/2015   Authorization - Visit Number 13   Authorization - Number of Visits 20   OT Start Time 9458   OT Stop Time 1148   OT Time Calculation (min) 45 min   Activity Tolerance Patient tolerated treatment well   Behavior During Therapy [facility] for tasks assessed/performed;Flat affect      Past Medical History  Diagnosis Date  . Hypertension   . Cancer ([place])   . Stroke [facility])     Past Surgical History  Procedure Laterality Date  . Appendectomy    . Kidney surgery      There were no vitals filed for this visit.  Visit Diagnosis:  Pain, hand joint, right  Spastic hemiplegia affecting nondominant side (HCC)  Impaired functional mobility and activity tolerance  Impaired cognition  Impaired activities of daily living      Subjective Assessment - 05/10/15 1109    Subjective  Pt open to work on whatever therapist feels is appropriate. PCA present and states pt needs to work on transfers and "leaning forward"    Patient is accompained by: --  PCA   Pertinent History L CVA, small vessel disease and atrophy, see epic snapshot   Currently in Pain? No/denies             Treatment: PCA present during this OT session. Discussed shower stall transfers with patient and PCA. PCA reports pt uses tub transfer bench in walk-in shower (walk-in shower with no threshold). Pt stands using grab bars and min assist from PCA, pt then pivots  around to sit on bench. Attempted to perform tub bench transfer, but pt disinterested and stated "can we just work on something else". During attempt, pt with significant posterior and right lean which impacted his ability and independence to perform sit to/from stand and functional transfers. Discussed patient working on UB bathing, using L hand for under right arm and under left arm. Encouraged PCA to have patient do as much as he could in the shower, according to PCA pt currently requires total assist for bathing. From here, pt worked on forward leaning activity in seated; reaching forward and to left.  Therapist then sat in front of patient and had patient perform 10 semi-squats from w/c to again focus on forward & left leaning. Encouraged PCA to provide cueing for technique in order to increase safety during transfers and mobility. Pt with bean bag splint to RUE, pt wearing at beginning of session. Therapist doffed splint during session, then donned at end of session.                OT Education - 05/10/15 1153    Education provided Yes   Education Details Patient with significant posterior lean. Educated patient on importance of forward leaning "nose over toes" during transfers and sit to/from stands in order to help with fall prevention.    Person(s) Educated Patient;Caregiver(s)   Methods Explanation;Demonstration;Tactile cues;Verbal cues  Comprehension --  Pt needs continued education on this          OT Short Term Goals - 05/10/15 1200    OT SHORT TERM GOAL #1   Title Pt ,family and PCA will be mod I with HEP - 04/23/2015   Status Achieved   OT SHORT TERM GOAL #2   Title Pt will tolerate new splint to assist with spasticity and ROM per wearing schedule   Status Achieved  splint finalized 10/4 - tolerates donning and doffing.  reassess next visit   OT SHORT TERM GOAL #3   Title Wife and PCA will demonstrate understanding of wear/care of splint as well as wearing schedule.     Status Achieved  daughter and caregiver   OT SHORT TERM GOAL #4   Title Pt will be supervision with simple grooming activities (vc's to intiate)   Status Achieved   OT SHORT TERM GOAL #5   Title Pt will be max for UB bathing   Status Achieved   OT SHORT TERM GOAL #6   Title Pt will be mod a for toilet transfers   Status Achieved           OT Long Term Goals - 05/10/15 1200    OT LONG TERM GOAL #1   Title Pt, wife and PCA will be mod I with upgraded HEP -05/21/2015   Status On-going   OT LONG TERM GOAL #2   Title Pt will be mod a for UB bathing   Status On-going   OT LONG TERM GOAL #3   Title Pt will be mod a for LB bathing   Status On-going   OT LONG TERM GOAL #4   Title Pt will be min a for standing balance to allow PCA to hike pants using grab bar.   Status Achieved   OT LONG TERM GOAL #5   Title Pt will demonstrate improved PROM in RUE to ease in caregiver's ability to complete UB ADL's   Status On-going   OT LONG TERM GOAL #6   Title Pt will be min a for toilet transfers   Status Achieved   OT LONG TERM GOAL #7   Title Pt will be mod a for shower transfers   Status On-going               Plan - 05/10/15 1158    Clinical Impression Statement Pt is making progress towards remaining LTG's. PCA present today. Provided education on transfer set-up and importance of cueing patient for safety in order to decrease risk of falls.    Pt will benefit from skilled therapeutic intervention in order to improve on the following deficits (Retired) Abnormal gait;Decreased activity tolerance;Decreased balance;Decreased cognition;Decreased mobility;Decreased range of motion;Decreased safety awareness;Decreased strength;Impaired UE functional use;Impaired tone;Pain   Rehab Potential Fair   Clinical Impairments Affecting Rehab Potential cognition, severity of defiicits   OT Frequency 2x / week   OT Duration 8 weeks   OT Treatment/Interventions Self-care/ADL  training;Ultrasound;Traction;Moist Heat;Fluidtherapy;DME and/or AE instruction;Neuromuscular education;Therapeutic exercise;Functional Mobility Training;Manual Therapy;Passive range of motion;Splinting;Therapeutic activities;Balance training;Patient/family education   Plan continue to work on bathing, sit to/from stands, forward leaning, HEP   [place] Education provided:  04/04/15 initiated PROM HEP, reinforced 03/05/2015; toilet transfer training and equipment recoomendations 04/23/2015.  Patient indicates ROM program is being performed twice daily.    Consulted and Agree with Plan of Care Patient;Family member/caregiver   Family Member Consulted PCA  Problem List Patient Active Problem List   Diagnosis Date Noted  . Sepsis ([place]) 10/08/2014  . Acute encephalopathy 10/08/2014  . UTI (lower urinary tract infection) 10/08/2014  . Elevated troponin 10/08/2014  . Right hemiparesis ([place]) 07/05/2014  . Dysphagia, post-stroke 07/05/2014  . Dysarthria due to recent cerebral infarction 07/05/2014  . Left pontine stroke ([place]) 07/02/2014  . Embolic stroke involving left carotid artery ([place]) 06/28/2014  . Stroke ([place]) 06/28/2014  . CAP (community acquired pneumonia) 06/28/2014  . Normocytic anemia 06/28/2014  . Essential hypertension 06/28/2014  . Hypokalemia 06/28/2014    Luay Balding , MS, OTR/L, CLT 05/10/2015, 12:03 PM  [facility] [address] [place], [place], [address] Phone: [phone]   Fax:  [phone]  Name: [name] MRN: [id number] Date of Birth: 21-Dec-1925

## 2015-05-14 ENCOUNTER — Ambulatory Visit: Payer: Medicare Other | Admitting: Occupational Therapy

## 2015-05-14 ENCOUNTER — Ambulatory Visit: Payer: Medicare Other

## 2015-05-14 DIAGNOSIS — R269 Unspecified abnormalities of gait and mobility: Secondary | ICD-10-CM

## 2015-05-14 DIAGNOSIS — M79641 Pain in right hand: Secondary | ICD-10-CM

## 2015-05-14 DIAGNOSIS — M6289 Other specified disorders of muscle: Secondary | ICD-10-CM | POA: Diagnosis not present

## 2015-05-14 DIAGNOSIS — M25511 Pain in right shoulder: Secondary | ICD-10-CM

## 2015-05-14 DIAGNOSIS — R2681 Unsteadiness on feet: Secondary | ICD-10-CM | POA: Diagnosis not present

## 2015-05-14 DIAGNOSIS — Z7409 Other reduced mobility: Secondary | ICD-10-CM

## 2015-05-14 DIAGNOSIS — R531 Weakness: Secondary | ICD-10-CM

## 2015-05-14 DIAGNOSIS — G8110 Spastic hemiplegia affecting unspecified side: Secondary | ICD-10-CM

## 2015-05-14 DIAGNOSIS — R4189 Other symptoms and signs involving cognitive functions and awareness: Secondary | ICD-10-CM | POA: Diagnosis not present

## 2015-05-14 NOTE — Therapy (Signed)
[facility] [address] [place], [place], [address] Phone: [phone]   Fax:  [phone]  Occupational Therapy Treatment  Patient Details  Name: [name] MRN: [id number] Date of Birth: 17-Jan-1926 No Data Recorded  Encounter Date: 05/14/2015      OT End of Session - 05/14/15 1309    Visit Number 14   Date for OT Re-Evaluation 05/21/15   Authorization Type medicare will need g code and progress note every 10th visit   Authorization Time Period 05/21/2015   Authorization - Visit Number 14   Authorization - Number of Visits 20   OT Start Time 1110   OT Stop Time 1143   OT Time Calculation (min) 33 min   Activity Tolerance Patient tolerated treatment well   Behavior During Therapy [facility] for tasks assessed/performed      Past Medical History  Diagnosis Date  . Hypertension   . Cancer ([place])   . Stroke [facility])     Past Surgical History  Procedure Laterality Date  . Appendectomy    . Kidney surgery      There were no vitals filed for this visit.  Visit Diagnosis:  Spastic hemiplegia affecting nondominant side (HCC)  Impaired functional mobility and activity tolerance  Abnormality of gait  Right sided weakness  Pain, hand joint, right  Pain in joint, shoulder region, right      Subjective Assessment - 05/14/15 1307    Patient Stated Goals I just want to get better   Currently in Pain? No/denies         Treatment: Therapist practiced simulatedP shower  transfers with pt and PCA x 2 reps, caregiver returned demonstration providing only min-mod A.Pt/ caregiver were provided with information regarding transfer pole and where to purchase.  (second therapsit provided info to PCA regarding seat belt for w/c.) P/ROM to digits, wrist , elbow and shoulder within. tolerated A/ROM                        OT Short Term Goals - 05/10/15 1200    OT SHORT TERM GOAL #1   Title Pt  ,family and PCA will be mod I with HEP - 04/23/2015   Status Achieved   OT SHORT TERM GOAL #2   Title Pt will tolerate new splint to assist with spasticity and ROM per wearing schedule   Status Achieved  splint finalized 10/4 - tolerates donning and doffing.  reassess next visit   OT SHORT TERM GOAL #3   Title Wife and PCA will demonstrate understanding of wear/care of splint as well as wearing schedule.    Status Achieved  daughter and caregiver   OT SHORT TERM GOAL #4   Title Pt will be supervision with simple grooming activities (vc's to intiate)   Status Achieved   OT SHORT TERM GOAL #5   Title Pt will be max for UB bathing   Status Achieved   OT SHORT TERM GOAL #6   Title Pt will be mod a for toilet transfers   Status Achieved           OT Long Term Goals - 05/14/15 1309    OT LONG TERM GOAL #1   Title Pt, wife and PCA will be mod I with upgraded HEP -05/21/2015   Status On-going   OT LONG TERM GOAL #2   Title Pt will be mod a for UB bathing   Status On-going   OT  LONG TERM GOAL #3   Title Pt will be mod a for LB bathing   Status On-going   OT LONG TERM GOAL #4   Title Pt will be min a for standing balance to allow PCA to hike pants using grab bar.   Status Achieved   OT LONG TERM GOAL #5   Title Pt will demonstrate improved PROM in RUE to ease in caregiver's ability to complete UB ADL's   Status On-going   OT LONG TERM GOAL #6   Title Pt will be min a for toilet transfers   Status Achieved   OT LONG TERM GOAL #7   Title Pt will be mod a for shower transfers   Baseline 05/14/15   Status Achieved               Plan - 05/14/15 1307    Clinical Impression Statement Pt is progressing towards goals. Anticipate d/c next visit.   Pt will benefit from skilled therapeutic intervention in order to improve on the following deficits (Retired) Abnormal gait;Decreased activity tolerance;Decreased balance;Decreased cognition;Decreased mobility;Decreased range of  motion;Decreased safety awareness;Decreased strength;Impaired UE functional use;Impaired tone;Pain   Rehab Potential Fair   Clinical Impairments Affecting Rehab Potential cognition, severity of defiicits   OT Frequency 2x / week   OT Duration 8 weeks   OT Treatment/Interventions Self-care/ADL training;Ultrasound;Traction;Moist Heat;Fluidtherapy;DME and/or AE instruction;Neuromuscular education;Therapeutic exercise;Functional Mobility Training;Manual Therapy;Passive range of motion;Splinting;Therapeutic activities;Balance training;Patient/family education   Plan check progress towards goals. Anticpate d/c next visit   OT Home Exercise Plan Education provided:  04/04/15 initiated PROM HEP, reinforced 03/05/2015; toilet transfer training and equipment recoomendations 04/23/2015.  Patient indicates ROM program is being performed twice daily.    Consulted and Agree with Plan of Care Family member/caregiver   Family Member Consulted PCA        Problem List Patient Active Problem List   Diagnosis Date Noted  . Sepsis ([place]) 10/08/2014  . Acute encephalopathy 10/08/2014  . UTI (lower urinary tract infection) 10/08/2014  . Elevated troponin 10/08/2014  . Right hemiparesis ([place]) 07/05/2014  . Dysphagia, post-stroke 07/05/2014  . Dysarthria due to recent cerebral infarction 07/05/2014  . Left pontine stroke ([place]) 07/02/2014  . Embolic stroke involving left carotid artery ([place]) 06/28/2014  . Stroke ([place]) 06/28/2014  . CAP (community acquired pneumonia) 06/28/2014  . Normocytic anemia 06/28/2014  . Essential hypertension 06/28/2014  . Hypokalemia 06/28/2014    Zhuri Krass 05/14/2015, 1:10 PM Theone Murdoch, OTR/L [phone] Phone: [phone] 1:11 PM 05/14/2015 [facility] [address] [place], [place], [address] Phone: [phone]   Fax:  [phone]  Name: [name] MRN: [id number] Date of Birth: 1926/06/02

## 2015-05-14 NOTE — Therapy (Signed)
[facility] [address] [place], [place], [address] Phone: [phone]   Fax:  [phone]  Physical Therapy Treatment  Patient Details  Name: [name] MRN: [id number] Date of Birth: 06-Oct-1925 No Data Recorded  Encounter Date: 05/14/2015      PT End of Session - 05/14/15 1607    Visit Number 14   Number of Visits 17   Date for PT Re-Evaluation 05/25/15   Authorization Type G-code every 10th visit   PT Start Time 1017   PT Stop Time 1059   PT Time Calculation (min) 42 min   Equipment Utilized During Treatment Gait belt   Activity Tolerance Patient tolerated treatment well   Behavior During Therapy [facility] for tasks assessed/performed      Past Medical History  Diagnosis Date  . Hypertension   . Cancer ([place])   . Stroke [facility])     Past Surgical History  Procedure Laterality Date  . Appendectomy    . Kidney surgery      There were no vitals filed for this visit.  Visit Diagnosis:  Abnormality of gait      Subjective Assessment - 05/14/15 1020    Subjective Pt denied falls or changes since last visit.   Patient is accompained by: --  caregiver-Lorraine   Pertinent History HTN, hx of CA,    Patient Stated Goals I want to get back to normal.    Currently in Pain? No/denies        Manual therapy: PT performed R hamstring stretch with pt in seated position 2 x45mnute hold prior to amb. To improve R knee ext. Pt denied pain during and after stretch.                 [place] PT Treatment/Exercise - 05/14/15 1021    Ambulation/Gait   Ambulation/Gait Yes   Ambulation/Gait Assistance 4: Min assist;3: Mod assist   Ambulation/Gait Assistance Details VC's and tactile cues (and assist) to improve upright posture, lateral wt. shifting to advance contralateral LE, improve narrow BOS. Pt required seated rest breaks after each bout 2/2 fatigue. One PT to assist R LE and one PT to assist in  guarding/provide trunk support.   Ambulation Distance (Feet) --  3x7' in //bars, 15', 20', 48' with hemi-walker   Assistive device Hemi-walker;Parallel bars   Gait Pattern Step-to pattern;Decreased dorsiflexion - right;Right flexed knee in stance;Narrow base of support;Trunk flexed   Ambulation Surface Level;Indoor                PT Education - 05/14/15 1606    Education provided Yes   Education Details PT reiterated the importance of performing HEP, especially R hamstring stretch in order to improve R knee ext ROM. Additionally, PT doffed R AFO after amb., 2/2 R ankle edema and AFO was pressing into pt's skin; PT educated pt's caregiver to remove AFO when pt is not standing and to monitor skin integrity.   Person(s) Educated Patient;Caregiver(s)   Methods Explanation;Demonstration;Verbal cues;Tactile cues   Comprehension Verbalized understanding;Returned demonstration          PT Short Term Goals - 04/30/15 1335    PT SHORT TERM GOAL #1   Title Pt will be ind. in performing with caregiver assist in order to improve strength, flexibility, and balance. Target date: 04/23/2015   Status Partially Met   PT SHORT TERM GOAL #2   Title Perform BERG, TUG, and gait speed when pt able to stand/amb. for longer  durations (if appropriate). Target date: 04/23/15   Status Partially Met   PT SHORT TERM GOAL #3   Title Pt will stand with no UE and min guard for 1 minute in order to perform ADLs. Target date: 04/23/15.   Status Partially Met   PT SHORT TERM GOAL #4   Title Pt will ambulate 72' with LRAD and min A to improve functional mobiltiy. Target date: 04/23/15.   Status Partially Met           PT Long Term Goals - 04/26/15 1124    PT LONG TERM GOAL #1   Title Pt will verbalize understanding of CVA risk factors/sign/symptoms to decrease risk of another CVA. Target date: 05/21/15.   Status On-going   PT LONG TERM GOAL #2   Title Pt will ambulate 28' with min guard and LRAD to  improve functional mobility. Target date: 05/21/15.   Status On-going   PT LONG TERM GOAL #3   Title Pt will reach 5" outside BOS with supervision, while standing with no UE support, to perform ADLs. Target date: 05/21/15.   Status On-going   PT LONG TERM GOAL #4   Title Pt will propel manual w/c 150' with L UE/L LE with supervision to improve functional mobility. Target date: 05/21/15.   Status On-going               Plan - 05/14/15 1607    Clinical Impression Statement Pt demonstrated progress, as he was able to amb. longer distances with hemi-walker and less assist. Pt continues to require extensive cues and 2 person assist to ensure safety. Continue with POC.   Pt will benefit from skilled therapeutic intervention in order to improve on the following deficits Abnormal gait;Decreased endurance;Decreased activity tolerance;Decreased balance;Decreased knowledge of use of DME;Decreased strength;Impaired UE functional use;Decreased cognition;Decreased coordination;Decreased safety awareness;Impaired flexibility;Postural dysfunction;Decreased range of motion;Decreased mobility   Rehab Potential Fair   Clinical Impairments Affecting Rehab Potential impaired cognition and B knee contractures   PT Frequency 2x / week   PT Duration 8 weeks   PT Treatment/Interventions ADLs/Self Care Home Management;Neuromuscular re-education;Biofeedback;Electrical Stimulation;Therapeutic activities;Therapeutic exercise;Balance training;Manual techniques;Vestibular;Wheelchair mobility training;Orthotic Fit/Training;Functional mobility training;Patient/family education;Gait training;Stair training;DME Instruction   PT Next Visit Plan Gait training with hemi-walker, standing balance activities (reaching outside BOS).   PT Home Exercise Plan Stretch, strengthening, and balance HEP   Consulted and Agree with Plan of Care Patient;Family member/caregiver   Family Member Consulted caregiver, [name]         Problem List Patient Active Problem List   Diagnosis Date Noted  . Sepsis ([place]) 10/08/2014  . Acute encephalopathy 10/08/2014  . UTI (lower urinary tract infection) 10/08/2014  . Elevated troponin 10/08/2014  . Right hemiparesis ([place]) 07/05/2014  . Dysphagia, post-stroke 07/05/2014  . Dysarthria due to recent cerebral infarction 07/05/2014  . Left pontine stroke ([place]) 07/02/2014  . Embolic stroke involving left carotid artery ([place]) 06/28/2014  . Stroke ([place]) 06/28/2014  . CAP (community acquired pneumonia) 06/28/2014  . Normocytic anemia 06/28/2014  . Essential hypertension 06/28/2014  . Hypokalemia 06/28/2014    Jeff Frieden L 05/14/2015, 4:10 PM  [facility] [address] [place], [place], [address] Phone: [phone]   Fax:  [phone]  Name: [name] MRN: [id number] Date of Birth: [birth year]    Geoffry Paradise, PT,DPT 05/14/2015 4:10 PM Phone: [phone] Fax: [phone]

## 2015-05-17 ENCOUNTER — Ambulatory Visit: Payer: Medicare Other | Admitting: Physical Therapy

## 2015-05-17 ENCOUNTER — Ambulatory Visit: Payer: Medicare Other | Admitting: Occupational Therapy

## 2015-05-21 ENCOUNTER — Ambulatory Visit (INDEPENDENT_AMBULATORY_CARE_PROVIDER_SITE_OTHER): Payer: Medicare Other | Admitting: Neurology

## 2015-05-21 ENCOUNTER — Encounter: Payer: Self-pay | Admitting: Neurology

## 2015-05-21 VITALS — BP 118/63 | HR 76 | Ht 69.0 in

## 2015-05-21 DIAGNOSIS — G8110 Spastic hemiplegia affecting unspecified side: Secondary | ICD-10-CM

## 2015-05-21 DIAGNOSIS — I639 Cerebral infarction, unspecified: Secondary | ICD-10-CM | POA: Diagnosis not present

## 2015-05-21 NOTE — Progress Notes (Signed)
[facility]    Provider:  Dr Jaynee Eagles Referring Provider: Gaynelle Arabian, MD Primary Care Physician:  Simona Huh, MD  CC:  Stroke and confusion  HPI:  [name] is a 81 y.o. male here as a referral from Dr. Marisue Humble for stroke follow up Update 11/09/2014 : He returns for follow-up after last visit 1 month ago. He was hospitalized for a few days at [facility] and found to have some dehydration as well as urinary tract infection. MRI scan of the brain was obtained which are personally reviewed showed no acute infarct but old lacunar infarcts as well as left pontine infarct. He has made some slow improvement and his confusion and encephalopathy have resolved. He is able to walk with a hemiwalker with [facility] physical therapist. He has a right foot drop but can walk with her AFO. He has regained some strength in his hand but still has significant weakness. He plans to go home next week and home health has been set up. Family is requesting a prescription for portable wheelchair ramp. The patient, wife and daughter had lots of questions about his care and future risk for strokes and answered these. Last visit  10/10/2014 ( Dr Jaynee Eagles ) [name] is a 81 y.o. left handed male with history of HTN, prostate cancer, recent PNA with malaise X 1 week who was admitted on 06/27/14 with right sided weakness with right gaze preference and difficulty talking.  Patient was noted to be in A fib in setting of CAP. 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 Neurology followed for input and recommended ASA for thrombotic infarct due to small vessel disease.MBS revealed dysphagia and he was started on dysphagia 1 diet with nectar liquids but continues to have coughing episodes due to difficulty handling secretions  He was doing well, but over the weekend noticed a change in walking. Since then it has not been the  same, about 5 weeks ago. He is walking less. He is more confused. He has a dullness. He is at [facility] and he is not having mental stimulation. He has no social interaction. Since January he is having a cognitive decline. [place] and his wife provide the information. He didn't remember seeing his daughter. He is walking some but he can't go up 4 steps. He used to read the newspapers every day, he has lost interest. This has been going on for longer than 4 weeks. He was having difficulty swallowing, he is on a soft diet at the facility. The right arm is still weak, the leg has improved. He is on an SSRI for depression.  Update 05/21/2015 : He returns for follow-up after last visit 6 months ago. He recommended by his wife. They noticed that the patient has actually had decline in his ambulation after leaving [name] facility and coming home and getting home physical therapy. They currently getting outpatient therapy which seems to have helped him more than home therapy did. Patient does have spasticity and pain which prevents passive range of motion of the right wrist and hand. He walks quite limited mainly with a hemiwalker holding onto physical therapist. Patient states that he clearly wants to walk and is willing to work hard but tightness in his legs and hands may be limiting his mobility. He has not seen Dr. Dianna Limbo for outpatient follow-up and I will set that up. The patient was started on Lexapro for depression but  it made him quite sleepy and after it was discontinued he is much more alert. He is currently on Wellbutrin and tolerating it well. He does home exercises twice a day and he does have a 24-hour caregiver at home who also walks him Reviewed notes, labs and imaging from outside physicians, which showed:  Ct Angio Head and Neck W/cm &/or Wo Cm 06/28/2014  CTA HEAD: Moderately motion degraded examination limits evaluation without large vessel occlusion. Mild luminal  irregularity in a pattern suggesting atherosclerosis.  CTA NECK: Calcific atherosclerosis of the carotid bulbs without hemodynamically significant stenosis by NASCET criteria.  LEFT V1 and V2 intimal irregularity suggesting remote dissection without convincing evidence of acute vascular injury nor flow-limiting stenosis.     Dg Chest 2 View 06/27/2014  Right lower lobe and right middle lobe infiltrates consistent with pneumonia.    Ct Head (brain) Wo Contrast 06/27/2014  No evidence of acute intracranial abnormality. Atrophy, chronic small-vessel white matter ischemic changes and remote appearing right basal ganglia lacunar infarct.    MRI / MRA Brain Wo Contrast (personally reviewed images and agree with results) 06/28/2014  1. Acute nonhemorrhagic left paramedian pontine infarct corresponds with the patient's symptoms.  2. No acute supratentorial infarcts.  3. Extensive atrophy and white matter disease likely reflects the sequela of chronic microvascular ischemia.  4. Atherosclerotic changes in the vertebral arteries and basilar artery without a significant stenosis.  5. Mild distal small vessel attenuation throughout, worse in the posterior circulation.    Dg Chest Port 1 View 06/28/2014  Mildly worsening right basilar pneumonia noted.    Carotid Doppler There is 1-39% bilateral ICA stenosis. Vertebral artery flow is antegrade.   2-D echocardiogram 06/28/2014  - Left ventricle: The cavity size was normal. There was mild focal basal hypertrophy of the septum. Systolic function was normal. The estimated ejection fraction was in the range of 55% to 60%. Doppler parameters are consistent with abnormal left ventricular relaxation (grade 1 diastolic dysfunction).  - No cardiac source of emboli was indentified.   Review of Systems: Patient complains of symptoms per HPI as well as the following symptoms:  Weakness, pain, spasticity, gait  difficulty  Pertinent negatives per HPI. All others negative.   Social History   Social History  . Marital Status: Married    Spouse Name: [name]  . Number of Children: 2  . Years of Education: MBA   Occupational History  . Not on file.   Social History Main Topics  . Smoking status: Former Research scientist (life sciences)  . Smokeless tobacco: Not on file  . Alcohol Use: No     Comment: Occasionally  . Drug Use: No  . Sexual Activity: Not on file   Other Topics Concern  . Not on file   Social History Narrative   Live at [employer].   Caffeine: Drinks 2 diet pepsi per week.    Drinks coffee once per week.     Family History  Problem Relation Age of Onset  . CAD Father     Past Medical History  Diagnosis Date  . Cancer ([place])   . Stroke ([place])   . Hearing loss     Past Surgical History  Procedure Laterality Date  . Appendectomy    . Kidney surgery      Current Outpatient Prescriptions  Medication Sig Dispense Refill  . acetaminophen (TYLENOL) 500 MG tablet Take 500 mg by mouth every 6 (six) hours as needed for mild pain, moderate pain, fever or headache.    Marland Kitchen  atorvastatin (LIPITOR) 20 MG tablet Take 1 tablet (20 mg total) by mouth daily at 6 PM.    . buPROPion (WELLBUTRIN) 100 MG tablet Take 100 mg by mouth every morning. Pt not sure of dosage    . clopidogrel (PLAVIX) 75 MG tablet Take 1 tablet (75 mg total) by mouth daily. 30 tablet 11  . mupirocin ointment (BACTROBAN) 2 % APPLY A SMALL AMOUNT AA BID  0  . nitrofurantoin (MACRODANTIN) 50 MG capsule Take 50 mg by mouth at bedtime.    . tamsulosin (FLOMAX) 0.4 MG CAPS capsule Take 0.4 mg by mouth at bedtime.      No current facility-administered medications for this visit.    Allergies as of 05/21/2015  . (No Known Allergies)    Vitals: BP 118/63 mmHg  Pulse 76  Ht 5\' 9"  (1.753 m)  Wt  Last Weight:  Wt Readings from Last 1 Encounters:  12/14/14 124 lb 12.5 oz (56.6 kg)   Last Height:   Ht Readings from Last 1  Encounters:  05/21/15 5\' 9"  (1.753 m)    Physical exam: Exam: Gen: NAD, sitting in electric wheelchair     Frail elderly caucasian male               CV:  No murmur or gallop . No Carotid Bruits. No peripheral edema, warm, nontender Eyes: Conjunctivae clear without exudates or hemorrhage  Neuro: Detailed Neurologic Exam  Speech:    Minimal dysarthria Cognition:      The patient is oriented to person, follows one-step commands    recent and remote memory impaired;     Impaired attention, concentration,     fund of knowledge Cranial Nerves:    The pupils are equal, round, and reactive to light. Attempted to visualize fundi but pupils small. Visual fields are full to threat. No gaze preference, extraocular movements are full. Trigeminal sensation is intact and the muscles of mastication are normal.right facial droop.  The palate elevates in the midline. Hearing impaired. Voice is normal. Shoulder shrug is normal. The tongue has normal motion without fasciculations.   Coordination:    Attempted, cannot perform due to weakness  Gait:    Attempted, cannot perform due to weakness  Motor Observation:    no involuntary movements noted. Tone:    Right spasticity with increased tone  Posture:    Posture is erect in wheelchair    Strength: Spatic right hemiplegia 2/5 strength RUE with significant grip weakness and non-fixed flexion deformity at the right wrist and fingers and 3/5 RLE with right foot drop and and ankle DF 0/5. He is wearing right ankle brace Good strength on left side.  Sensation: right hemi-sensory loss     Reflex Exam:  DTR's: spastic on the right     Toes:    upgoing left Clonus:    Clonus is absent. Gait deferred as patient is in wheelchair most of the time     ASSESSMENT/PLAN [name] is a 81 y.o. left handed male with history of HTN, prostate cancer, recent PNA with malaise X 1 week who was admitted on 06/27/14 with right sided weakness with  right gaze preference and difficulty talking.  Patient was noted to be in transient A fib in setting of CAP. MRI/MRA head with acute nonhemorrhagic left paramedian pontine with extensive atrophy and white matter disease.   Patient with subsequent residual significant right hemiparesis  PLAN :   I had a long d/w patient and wife about  his remote stroke, risk for recurrent stroke/TIAs, personally independently reviewed imaging studies and stroke evaluation results and answered questions.Continue clopidogrel 75 mg daily  for secondary stroke prevention and maintain strict control of hypertension with blood pressure goal below 130/90, diabetes with hemoglobin A1c goal below 6.5% and lipids with LDL cholesterol goal below 100 mg/dL. we had a long discussion about spasticity which seems to be limiting his mobility. Patient seemed sensitive to CNS active medications hence we will not consider baclofen or Zanaflex and I would prefer Botox instead .I have referred the patient to Dr. Dianna Limbo for Botox injections to treat his spasticity and hopefully improve his walking and range of function in his right hand. I personally spoke to Dr. Dianna Limbo and requested an early appointment. Greater than 50% time during this 25 minute visit was spent on counseling and coordination of care. Followup in the future with me in 6 months or call earlier if necessary   Antony Contras, MD  [facility] [address] [place], [place] [address]  Phone [phone] Fax [phone]

## 2015-05-21 NOTE — Patient Instructions (Signed)
I had a long d/w patient and wife about his remote stroke, risk for recurrent stroke/TIAs, personally independently reviewed imaging studies and stroke evaluation results and answered questions.Continue clopidogrel 75 mg daily  for secondary stroke prevention and maintain strict control of hypertension with blood pressure goal below 130/90, diabetes with hemoglobin A1c goal below 6.5% and lipids with LDL cholesterol goal below 100 mg/dL. we had a long discussion about spasticity which seems to be limiting his mobility. Patient seemed sensitive to CNS active medications hence we will not consider baclofen or Zanaflex and I would prefer Botox instead .I have referred the patient to Dr. Dianna Limbo for Botox injections to treat his spasticity and hopefully improve his walking and range of function in his right hand. Followup in the future with me in 6 months or call earlier if necessary  Spasticity Spasticity is a condition in which certain muscles contract continuously. This causes stiffness or tightness of the muscles. It may interfere with movement, speech, and manner of walking. CAUSES  This condition is usually caused by damage to the portion of the brain or spinal cord that controls voluntary movement. It may occur in association with:  Spinal cord injury.  Multiple sclerosis.  Cerebral palsy.  Brain damage due to lack of oxygen.  Brain trauma.  Severe head injury.  Metabolic diseases such as:  Adrenoleukodystrophy.  ALS [place] Cerise Gehrig's disease).  Phenylketonuria. SYMPTOMS   Increased muscle tone (hypertonicity).  A series of rapid muscle contractions (clonus).  Exaggerated deep tendon reflexes.  Muscle spasms.  Involuntary crossing of the legs (scissoring).  Fixed joints. The degree of spasticity varies. It ranges from mild muscle stiffness to severe, painful, and uncontrollable muscle spasms. It can interfere with rehabilitation in patients with certain disorders. It often  interferes with daily activities. TREATMENT  Treatment may include:  Medications.  Physical therapy regimens. They may include muscle stretching and range of motion exercises. These help prevent shrinkage or shortening of muscles. They also help reduce the severity of symptoms.  Surgery. This may be recommended for tendon release or to sever the nerve-muscle pathway. PROGNOSIS  The outcome for those with spasticity depends on:  Severity of the spasticity.  Associated disorder(s).   This information is not intended to replace advice given to you by your health care provider. Make sure you discuss any questions you have with your health care provider.   Document Released: 07/03/2002 Document Revised: 10/05/2011 Document Reviewed: 09/26/2013 Elsevier Interactive Patient Education [employer].

## 2015-05-22 ENCOUNTER — Ambulatory Visit: Payer: Medicare Other | Admitting: Physical Therapy

## 2015-05-22 ENCOUNTER — Ambulatory Visit: Payer: Medicare Other | Admitting: Occupational Therapy

## 2015-05-22 DIAGNOSIS — G8110 Spastic hemiplegia affecting unspecified side: Secondary | ICD-10-CM

## 2015-05-22 DIAGNOSIS — M79641 Pain in right hand: Secondary | ICD-10-CM

## 2015-05-22 DIAGNOSIS — R2681 Unsteadiness on feet: Secondary | ICD-10-CM | POA: Diagnosis not present

## 2015-05-22 DIAGNOSIS — R4189 Other symptoms and signs involving cognitive functions and awareness: Secondary | ICD-10-CM

## 2015-05-22 DIAGNOSIS — R269 Unspecified abnormalities of gait and mobility: Secondary | ICD-10-CM

## 2015-05-22 DIAGNOSIS — M25511 Pain in right shoulder: Secondary | ICD-10-CM | POA: Diagnosis not present

## 2015-05-22 DIAGNOSIS — R531 Weakness: Secondary | ICD-10-CM

## 2015-05-22 DIAGNOSIS — M6289 Other specified disorders of muscle: Secondary | ICD-10-CM | POA: Diagnosis not present

## 2015-05-22 NOTE — Therapy (Signed)
[facility] [address] [place], [place], [address] Phone: [phone]   Fax:  [phone]  Occupational Therapy Treatment  Patient Details  Name: [name] MRN: [id number] Date of Birth: 12-01-1925 No Data Recorded  Encounter Date: 05/22/2015      OT End of Session - 05/22/15 1024    Visit Number 15   Date for OT Re-Evaluation 05/21/15   Authorization Type medicare will need g code and progress note every 10th visit   Authorization Time Period 05/21/2015   Authorization - Visit Number 15   Authorization - Number of Visits 20   OT Start Time 1030   OT Stop Time 1100   OT Time Calculation (min) 30 min   Activity Tolerance Patient tolerated treatment well   Behavior During Therapy [facility] for tasks assessed/performed      Past Medical History  Diagnosis Date  . Cancer ([place])   . Stroke ([place])   . Hearing loss     Past Surgical History  Procedure Laterality Date  . Appendectomy    . Kidney surgery      There were no vitals filed for this visit.  Visit Diagnosis:  Pain, hand joint, right  Pain in joint, shoulder region, right  Impaired cognition  Right sided weakness  Spastic hemiplegia affecting nondominant side (HCC)      Subjective Assessment - 05/22/15 1022    Subjective  Pt agrees with plans to discharge today.   Pertinent History L CVA, small vessel disease and atrophy, see epic snapshot   Patient Stated Goals I just want to get better            Treatment: therapist checked goals and discussed progress with pt/ caregiver. Gentle P/ROM to right shoulder, elbow, forearm, wrist and hand.                      OT Short Term Goals - 05/10/15 1200    OT SHORT TERM GOAL #1   Title Pt ,family and PCA will be mod I with HEP - 04/23/2015   Status Achieved   OT SHORT TERM GOAL #2   Title Pt will tolerate new splint to assist with spasticity and ROM per wearing schedule   Status Achieved  splint finalized 10/4 - tolerates donning and doffing.  reassess next visit   OT SHORT TERM GOAL #3   Title Wife and PCA will demonstrate understanding of wear/care of splint as well as wearing schedule.    Status Achieved  daughter and caregiver   OT SHORT TERM GOAL #4   Title Pt will be supervision with simple grooming activities (vc's to intiate)   Status Achieved   OT SHORT TERM GOAL #5   Title Pt will be max for UB bathing   Status Achieved   OT SHORT TERM GOAL #6   Title Pt will be mod a for toilet transfers   Status Achieved           OT Long Term Goals - 05/22/15 1024    OT LONG TERM GOAL #1   Title Pt, wife and PCA will be mod I with upgraded HEP -05/21/2015   Baseline inital HEP is still aprropriate for pt.   Status Achieved   OT LONG TERM GOAL #2   Title Pt will be mod a for UB bathing   Status Achieved   OT LONG TERM GOAL #3   Title Pt will be mod a for LB  bathing   Baseline Pt does 35% of lower body bathing   Status Not Met   OT LONG TERM GOAL #4   Title Pt will be min a for standing balance to allow PCA to hike pants using grab bar.   Status Achieved   OT LONG TERM GOAL #5   Title Pt will demonstrate improved PROM in RUE to ease in caregiver's ability to complete UB ADL's   Status Achieved   OT LONG TERM GOAL #6   Title Pt will be min a for toilet transfers   Status Achieved   OT LONG TERM GOAL #7   Title Pt will be mod a for shower transfers   Baseline 05/14/15   Status Achieved               Plan - 2015-05-27 1023    Clinical Impression Statement Pt/ caregiver agree with plans for d/c from OT today.   Pt will benefit from skilled therapeutic intervention in order to improve on the following deficits (Retired) Abnormal gait;Decreased activity tolerance;Decreased balance;Decreased cognition;Decreased mobility;Decreased range of motion;Decreased safety awareness;Decreased strength;Impaired UE functional use;Impaired tone;Pain    Clinical Impairments Affecting Rehab Potential cognition, severity of defiicits   OT Frequency 2x / week   OT Duration 8 weeks   OT Treatment/Interventions Self-care/ADL training;Ultrasound;Traction;Moist Heat;Fluidtherapy;DME and/or AE instruction;Neuromuscular education;Therapeutic exercise;Functional Mobility Training;Manual Therapy;Passive range of motion;Splinting;Therapeutic activities;Balance training;Patient/family education   Plan discharge from [place] Education provided:  04/04/15 initiated PROM HEP, reinforced 03/05/2015; toilet transfer training and equipment recoomendations 04/23/2015.  Patient indicates ROM program is being performed twice daily.    Consulted and Agree with Plan of Care Family member/caregiver   Family Member Consulted PCA          G-Codes - 2015/05/27 1043    Functional Assessment Tool Used Pt is mod A with UB bathing and toilet transfers   Functional Limitation Self care   Self Care Goal Status (T7711) At least 60 percent but less than 80 percent impaired, limited or restricted   Self Care Discharge Status [phone]) At least 60 percent but less than 80 percent impaired, limited or restricted      Problem List Patient Active Problem List   Diagnosis Date Noted  . Sepsis ([place]) 10/08/2014  . Acute encephalopathy 10/08/2014  . UTI (lower urinary tract infection) 10/08/2014  . Elevated troponin 10/08/2014  . Right hemiparesis ([place]) 07/05/2014  . Dysphagia, post-stroke 07/05/2014  . Dysarthria due to recent cerebral infarction 07/05/2014  . 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 Embolic stroke involving left carotid artery ([place]) 06/28/2014  . Stroke ([place]) 06/28/2014  . CAP (community acquired pneumonia) 06/28/2014  . Normocytic anemia 06/28/2014  . Essential hypertension 06/28/2014  . Hypokalemia 06/28/2014  OCCUPATIONAL THERAPY DISCHARGE SUMMARY   Current functional level related to goals / functional outcomes: Pt made good progress  towards goals despite his RUE contracture/ spasticity.   Remaining deficits: Cognitive deficits, weakness, decreased balance and functional mobility.   Education / Equipment:  Education was performed regarding: splint wear, care, transfers and HEP. Pt's caregiver's returned demonstration.  Pt anticipates botox in the future. Therapist discussed with pt/ caregiver that therapy would only be recommended if there was a significant change following botox.  Plan: Patient agrees to discharge.  Patient goals were partially met. Patient is being discharged due to meeting the stated rehab goals.  ?????      [name] 05-27-15, 5:13 PM [name], OTR/L [phone] Phone: [phone] 5:18 PM 05/22/2015 [facility] [address] [place], [place], [address] Phone: [phone]   Fax:  [phone]  Name: [name] MRN: [id number] Date of Birth: [birth year]

## 2015-05-23 NOTE — Therapy (Signed)
[facility] [address] [place], [place], [address] Phone: [phone]   Fax:  [phone]  Physical Therapy Treatment  Patient Details  Name: [name] MRN: [id number] Date of Birth: 09/28/1925 No Data Recorded  Encounter Date: 05/22/2015      PT End of Session - 05/23/15 1305    Visit Number 15   Number of Visits 17   Date for PT Re-Evaluation 05/25/15   Authorization Type G-code every 10th visit   PT Start Time 1106   PT Stop Time 1145   PT Time Calculation (min) 39 min   Equipment Utilized During Treatment Gait belt   Activity Tolerance Patient tolerated treatment well   Behavior During Therapy [facility] for tasks assessed/performed      Past Medical History  Diagnosis Date  . Cancer ([place])   . Stroke ([place])   . Hearing loss     Past Surgical History  Procedure Laterality Date  . Appendectomy    . Kidney surgery      There were no vitals filed for this visit.  Visit Diagnosis:  Abnormality of gait      Subjective Assessment - 05/23/15 1303    Subjective Pt denies falls or changes.  "Why am I not allowed to walk at home?"   Patient is accompained by: --  caregiver   Pertinent History HTN, hx of CA,    Patient Stated Goals I want to get back to normal.    Currently in Pain? No/denies      Standing in standing frame x 7 minutes for LE stretching prior to gait. Gait x 36' x 1 and 24' x 1 with hemiwalker and +2 total assist (pt=50%).  Pt with significant posterior lean, decreased foot clearance, bil knee flexion contractures, decreased BOS.  Required constant cues for gait. Unable to propel w/c without mod assist Standing at raised mat table with L UE support x 1 minute x 2 for static standing.  Needs min assist to maintain standing.          PT Education - 05/23/15 1305    Education provided Yes   Education Details Process for checking LTG's   Person(s) Educated Patient;Caregiver(s)    Methods Explanation   Comprehension Verbalized understanding          PT Short Term Goals - 04/30/15 1335    PT SHORT TERM GOAL #1   Title Pt will be ind. in performing with caregiver assist in order to improve strength, flexibility, and balance. Target date: 04/23/2015   Status Partially Met   PT SHORT TERM GOAL #2   Title Perform BERG, TUG, and gait speed when pt able to stand/amb. for longer durations (if appropriate). Target date: 04/23/15   Status Partially Met   PT SHORT TERM GOAL #3   Title Pt will stand with no UE and min guard for 1 minute in order to perform ADLs. Target date: 04/23/15.   Status Partially Met   PT SHORT TERM GOAL #4   Title Pt will ambulate 67' with LRAD and min A to improve functional mobiltiy. Target date: 04/23/15.   Status Partially Met           PT Long Term Goals - 05/23/15 1306    PT LONG TERM GOAL #1   Title Pt will verbalize understanding of CVA risk factors/sign/symptoms to decrease risk of another CVA. Target date: 05/21/15.   Status On-going   PT LONG TERM GOAL #2  Title Pt will ambulate 59' with min guard and LRAD to improve functional mobility. Target date: 05/21/15.   Status Not Met   PT LONG TERM GOAL #3   Title Pt will reach 5" outside BOS with supervision, while standing with no UE support, to perform ADLs. Target date: 05/21/15.   Status Not Met   PT LONG TERM GOAL #4   Title Pt will propel manual w/c 150' with L UE/L LE with supervision to improve functional mobility. Target date: 05/21/15.   Status Not Met               Plan - 05/23/15 1308    Clinical Impression Statement Pt did not meet LTG 2, 3 or 4.  Pt continues to need significant assistance to ambulate with increased posterior lean/pushing, poor knee extension and bil knee contractures.  Finish checking LTG's.   Pt will benefit from skilled therapeutic intervention in order to improve on the following deficits Abnormal gait;Decreased endurance;Decreased activity  tolerance;Decreased balance;Decreased knowledge of use of DME;Decreased strength;Impaired UE functional use;Decreased cognition;Decreased coordination;Decreased safety awareness;Impaired flexibility;Postural dysfunction;Decreased range of motion;Decreased mobility   Rehab Potential Fair   Clinical Impairments Affecting Rehab Potential impaired cognition and B knee contractures   PT Frequency 2x / week   PT Duration 8 weeks   PT Treatment/Interventions ADLs/Self Care Home Management;Neuromuscular re-education;Biofeedback;Electrical Stimulation;Therapeutic activities;Therapeutic exercise;Balance training;Manual techniques;Vestibular;Wheelchair mobility training;Orthotic Fit/Training;Functional mobility training;Patient/family education;Gait training;Stair training;DME Instruction   PT Next Visit Plan Finish checking goals   PT Home Exercise Plan Stretch, strengthening, and balance HEP   Consulted and Agree with Plan of Care Patient;Family member/caregiver   Family Member Consulted caregiver, [name]        Problem List Patient Active Problem List   Diagnosis Date Noted  . Sepsis ([place]) 10/08/2014  . Acute encephalopathy 10/08/2014  . UTI (lower urinary tract infection) 10/08/2014  . Elevated troponin 10/08/2014  . Right hemiparesis ([place]) 07/05/2014  . Dysphagia, post-stroke 07/05/2014  . Dysarthria due to recent cerebral infarction 07/05/2014  . 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 Embolic stroke involving left carotid artery ([place]) 06/28/2014  . Stroke ([place]) 06/28/2014  . CAP (community acquired pneumonia) 06/28/2014  . Normocytic anemia 06/28/2014  . Essential hypertension 06/28/2014  . Hypokalemia 06/28/2014    Narda Bonds 05/23/2015, 2:28 PM  [facility] [address] [place], [place], [address] Phone: [phone]   Fax:  [phone]  Name: [name] MRN: [id number] Date of Birth:  [birth year]    Narda Bonds, [place] 05/23/2015 2:28 PM Phone: [phone] Fax: [phone]

## 2015-05-24 ENCOUNTER — Ambulatory Visit: Payer: Medicare Other

## 2015-05-24 ENCOUNTER — Encounter: Payer: Medicare Other | Admitting: Occupational Therapy

## 2015-05-24 DIAGNOSIS — M25511 Pain in right shoulder: Secondary | ICD-10-CM | POA: Diagnosis not present

## 2015-05-24 DIAGNOSIS — R531 Weakness: Secondary | ICD-10-CM

## 2015-05-24 DIAGNOSIS — R269 Unspecified abnormalities of gait and mobility: Secondary | ICD-10-CM | POA: Diagnosis not present

## 2015-05-24 DIAGNOSIS — M79641 Pain in right hand: Secondary | ICD-10-CM | POA: Diagnosis not present

## 2015-05-24 DIAGNOSIS — R4189 Other symptoms and signs involving cognitive functions and awareness: Secondary | ICD-10-CM | POA: Diagnosis not present

## 2015-05-24 DIAGNOSIS — M6289 Other specified disorders of muscle: Secondary | ICD-10-CM | POA: Diagnosis not present

## 2015-05-24 DIAGNOSIS — R2681 Unsteadiness on feet: Secondary | ICD-10-CM | POA: Diagnosis not present

## 2015-05-24 NOTE — Patient Instructions (Signed)
Stroke Prevention Some medical conditions and behaviors are associated with an increased chance of having a stroke. You may prevent a stroke by making healthy choices and managing medical conditions. HOW CAN I REDUCE MY RISK OF HAVING A STROKE?   Stay physically active. Get at least 30 minutes of activity on most or all days.  Do not smoke. It may also be helpful to avoid exposure to secondhand smoke.  Limit alcohol use. Moderate alcohol use is considered to be:  No more than 2 drinks per day for men.  No more than 1 drink per day for nonpregnant women.  Eat healthy foods. This involves:  Eating 5 or more servings of fruits and vegetables a day.  Making dietary changes that address high blood pressure (hypertension), high cholesterol, diabetes, or obesity.  Manage your cholesterol levels.  Making food choices that are high in fiber and low in saturated fat, trans fat, and cholesterol may control cholesterol levels.  Take any prescribed medicines to control cholesterol as directed by your health care provider.  Manage your diabetes.  Controlling your carbohydrate and sugar intake is recommended to manage diabetes.  Take any prescribed medicines to control diabetes as directed by your health care provider.  Control your hypertension.  Making food choices that are low in salt (sodium), saturated fat, trans fat, and cholesterol is recommended to manage hypertension.  Ask your health care provider if you need treatment to lower your blood pressure. Take any prescribed medicines to control hypertension as directed by your health care provider.  If you are 18-39 years of age, have your blood pressure checked every 3-5 years. If you are 40 years of age or older, have your blood pressure checked every year.  Maintain a healthy weight.  Reducing calorie intake and making food choices that are low in sodium, saturated fat, trans fat, and cholesterol are recommended to manage  weight.  Stop drug abuse.  Avoid taking birth control pills.  Talk to your health care provider about the risks of taking birth control pills if you are over 35 years old, smoke, get migraines, or have ever had a blood clot.  Get evaluated for sleep disorders (sleep apnea).  Talk to your health care provider about getting a sleep evaluation if you snore a lot or have excessive sleepiness.  Take medicines only as directed by your health care provider.  For some people, aspirin or blood thinners (anticoagulants) are helpful in reducing the risk of forming abnormal blood clots that can lead to stroke. If you have the irregular heart rhythm of atrial fibrillation, you should be on a blood thinner unless there is a good reason you cannot take them.  Understand all your medicine instructions.  Make sure that other conditions (such as anemia or atherosclerosis) are addressed. SEEK IMMEDIATE MEDICAL CARE IF:   You have sudden weakness or numbness of the face, arm, or leg, especially on one side of the body.  Your face or eyelid droops to one side.  You have sudden confusion.  You have trouble speaking (aphasia) or understanding.  You have sudden trouble seeing in one or both eyes.  You have sudden trouble walking.  You have dizziness.  You have a loss of balance or coordination.  You have a sudden, severe headache with no known cause.  You have new chest pain or an irregular heartbeat. Any of these symptoms may represent a serious problem that is an emergency. Do not wait to see if the symptoms will   go away. Get medical help at once. Call your local emergency services (911 in [place] Do not drive yourself to the hospital.   This information is not intended to replace advice given to you by your health care provider. Make sure you discuss any questions you have with your health care provider.   Document Released: 08/20/2004 Document Revised: 08/03/2014 Document Reviewed:  01/13/2013 Elsevier Interactive Patient Education 2016 Elsevier Inc.  

## 2015-05-24 NOTE — Therapy (Signed)
[facility] [address] [place], [place], [address] Phone: [phone]   Fax:  [phone]  Physical Therapy Treatment  Patient Details  Name: [name] MRN: [id number] Date of Birth: 08/31/1925 No Data Recorded  Encounter Date: 05/24/2015      PT End of Session - 05/24/15 1122    Visit Number 16   Number of Visits 17   Date for PT Re-Evaluation 05/25/15   Authorization Type G-code every 10th visit   PT Start Time 1019   PT Stop Time 1100   PT Time Calculation (min) 41 min   Activity Tolerance Patient tolerated treatment well   Behavior During Therapy [facility] for tasks assessed/performed      Past Medical History  Diagnosis Date  . Cancer ([place])   . Stroke ([place])   . Hearing loss     Past Surgical History  Procedure Laterality Date  . Appendectomy    . Kidney surgery      There were no vitals filed for this visit.  Visit Diagnosis:  Abnormality of gait  Right sided weakness      Subjective Assessment - 05/24/15 1022    Subjective Pt denied falls or changs since last visit.    Patient is accompained by: Family member  [place]   Pertinent History HTN, hx of CA,    Patient Stated Goals I want to get back to normal.    Currently in Pain? No/denies                            Self Care:     PT Education - 05/24/15 1118    Education provided Yes   Education Details PT discussed CVA signs/symptoms and provided handout. PT spent extensive time discussing why pt will be put on hold until after botox (if MD determines this is the best course of action). Pt's wife was upset, as she thought that pt would continue PT until that time. PT discussed that pt has reached current maximal  benefits from PT at this time, and that he is limited due to R knee flexion contracture. However, PT would like to see pt 3 weeks after botox in order to trial gait training for 2x/week for 4  weeks. Botox should reduce R LE tone and allow pt to amb. with incr. R knee ext. PT reiterated the importance of pt performing HEP every day. PT explained that pt's R knee flexion contracture is still significant after stretching/HEP program and this is what causes pt to have difficulty walking without assistance, and PT would like to trial gait training after botox injection.    Person(s) Educated Patient;Spouse;Caregiver(s)   Methods Explanation;Handout   Comprehension Verbalized understanding          PT Short Term Goals - 04/30/15 1335    PT SHORT TERM GOAL #1   Title Pt will be ind. in performing with caregiver assist in order to improve strength, flexibility, and balance. Target date: 04/23/2015   Status Partially Met   PT SHORT TERM GOAL #2   Title Perform BERG, TUG, and gait speed when pt able to stand/amb. for longer durations (if appropriate). Target date: 04/23/15   Status Partially Met   PT SHORT TERM GOAL #3   Title Pt will stand with no UE and min guard for 1 minute in order to perform ADLs. Target date: 04/23/15.   Status Partially Met   PT  SHORT TERM GOAL #4   Title Pt will ambulate 85' with LRAD and min A to improve functional mobiltiy. Target date: 04/23/15.   Status Partially Met           PT Long Term Goals - 2015-06-06 1126    PT LONG TERM GOAL #1   Title Pt will verbalize understanding of CVA risk factors/sign/symptoms to decrease risk of another CVA. Target date: 05/21/15.   Status Achieved   PT LONG TERM GOAL #2   Title Pt will ambulate 45' with min guard and LRAD to improve functional mobility. Target date: 05/21/15.   Status Not Met   PT LONG TERM GOAL #3   Title Pt will reach 5" outside BOS with supervision, while standing with no UE support, to perform ADLs. Target date: 05/21/15.   Status Not Met   PT LONG TERM GOAL #4   Title Pt will propel manual w/c 150' with L UE/L LE with supervision to improve functional mobility. Target date: 05/21/15.   Status  Not Met               Plan - 06/06/2015 1122    Clinical Impression Statement PT is placing pt on hold, as he has reached maximal potential with PT prior to botox. Pt would benefit from gait training after botox injection in R LE, as pt is limited 2/2 incr. R LE tone and R knee flexion contracture.  PT instructed pt's wife and caregiver to call and make PT appointments (2x/week for 4 weeks), starting 3 weeks after botox injection.    Pt will benefit from skilled therapeutic intervention in order to improve on the following deficits Abnormal gait;Decreased endurance;Decreased activity tolerance;Decreased balance;Decreased knowledge of use of DME;Decreased strength;Impaired UE functional use;Decreased cognition;Decreased coordination;Decreased safety awareness;Impaired flexibility;Postural dysfunction;Decreased range of motion;Decreased mobility   Rehab Potential Fair   Clinical Impairments Affecting Rehab Potential impaired cognition and B knee contractures   PT Frequency 2x / week   PT Duration 8 weeks   PT Treatment/Interventions ADLs/Self Care Home Management;Neuromuscular re-education;Biofeedback;Electrical Stimulation;Therapeutic activities;Therapeutic exercise;Balance training;Manual techniques;Vestibular;Wheelchair mobility training;Orthotic Fit/Training;Functional mobility training;Patient/family education;Gait training;Stair training;DME Instruction   PT Next Visit Plan Hold until after botox injectoins.   PT Home Exercise Plan Stretch, strengthening, and balance HEP   Consulted and Agree with Plan of Care Patient;Family member/caregiver   Family Member Consulted caregiver, [name] and wife: Dominic Pea - 2015/06/06 1126    Functional Assessment Tool Used --   Functional Limitation --   Mobility: Walking and Moving Around Current Status [phone]) --   Mobility: Walking and Moving Around Goal Status [phone]) --      Problem List Patient Active Problem List    Diagnosis Date Noted  . Sepsis ([place]) 10/08/2014  . Acute encephalopathy 10/08/2014  . UTI (lower urinary tract infection) 10/08/2014  . Elevated troponin 10/08/2014  . Right hemiparesis ([place]) 07/05/2014  . Dysphagia, post-stroke 07/05/2014  . Dysarthria due to recent cerebral infarction 07/05/2014  . Left pontine stroke ([place]) 07/02/2014  . Embolic stroke involving left carotid artery ([place]) 06/28/2014  . Stroke ([place]) 06/28/2014  . CAP (community acquired pneumonia) 06/28/2014  . Normocytic anemia 06/28/2014  . Essential hypertension 06/28/2014  . Hypokalemia 06/28/2014    Wasyl Dornfeld L Jun 06, 2015, 11:29 AM  [facility] [address] [place], [place], [address] Phone: [phone]   Fax:  [phone]  Name: [name] MRN: [id number] Date of Birth: 1926/02/09  Geoffry Paradise, PT,DPT 05/24/2015 11:29 AM Phone: [phone] Fax: [phone]

## 2015-05-31 ENCOUNTER — Encounter: Payer: Medicare Other | Attending: Physical Medicine & Rehabilitation

## 2015-05-31 ENCOUNTER — Ambulatory Visit (HOSPITAL_BASED_OUTPATIENT_CLINIC_OR_DEPARTMENT_OTHER): Payer: Medicare Other | Admitting: Physical Medicine & Rehabilitation

## 2015-05-31 ENCOUNTER — Encounter: Payer: Self-pay | Admitting: Physical Medicine & Rehabilitation

## 2015-05-31 VITALS — BP 148/67 | HR 79 | Resp 14

## 2015-05-31 DIAGNOSIS — G8111 Spastic hemiplegia affecting right dominant side: Secondary | ICD-10-CM

## 2015-05-31 DIAGNOSIS — I639 Cerebral infarction, unspecified: Secondary | ICD-10-CM | POA: Diagnosis not present

## 2015-05-31 DIAGNOSIS — I6350 Cerebral infarction due to unspecified occlusion or stenosis of unspecified cerebral artery: Secondary | ICD-10-CM | POA: Insufficient documentation

## 2015-05-31 DIAGNOSIS — IMO0002 Reserved for concepts with insufficient information to code with codable children: Secondary | ICD-10-CM | POA: Insufficient documentation

## 2015-05-31 NOTE — Patient Instructions (Signed)

## 2015-05-31 NOTE — Progress Notes (Signed)
81 y.o. left handed male with history of HTN, prostate cancer, recent PNA with malaise X 1 week who was admitted on 06/27/14 with right sided weakness with right gaze preference and difficulty talking. Patient was noted to be in A fib in setting of CAP and CT head without acute changes. EKG evaluated by cardiology and felt to have multiple artifacts with 1 degree AVB and repeat EKG with NSR.  NIHSS- 14. 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  MBS revealed dysphagia and he was started on dysphagia 1 diet with nectar liquids but continues to have coughing episodes due to difficulty handling secretions Admit date: 07/02/2014 Discharge date: 08/01/13  Patient post discharge from rehabilitation went to skilled nursing facility for several months  Patient went back home where he received home health for 4 months. He has most recently been in outpatient rehabilitation PT and OT. Patient's wife states that after he discharged from the skilled nursing facility he is walking 250 feet with minimal assistance. Now he can walk only very short distances with physical assistance. She thinks he was in home health therapy to long.  Examination Gen. No acute distress Elderly male with right upper extremity abducted intramedullary rotated, pronated   lower extremity with flexion at knee Motor strength is 0/5 in right upper extremity 2 minus hip flexion and knee extension, trace ankle plantar flexion on the right. Left side is 4/5 in the upper and lower limb Speech is without dysarthria  A/P 1. Left Paramedian pontine infarct approximately 11 months ago with right spastic hemiplegia He has severe spasticity  limiting range of motion in the right upper extremity limiting hygiene and causing pain  In the right lower extremity has hamstring spasticity that is causing problems with standing  We'll do Botox injection as below but this may take several cycles to  Relieve spasticity adequately in fact cannot even get to the FCR or FDS because of the severe spasticity in the right upper extremity  May need to do musculocutaneous nerve block with phenol next visit if Botox does not adequately relax the elbow flexors  Botox Injection for spasticity using needle EMG guidance  Dilution: 50 Units/ml Indication: Severe spasticity which interferes with ADL,mobility and/or  hygiene and is unresponsive to medication management and other conservative care Informed consent was obtained after describing risks and benefits of the procedure with the patient. This includes bleeding, bruising, infection, excessive weakness, or medication side effects. A REMS form is on file and signed. Needle: 27g 1inch needle electrode Number of units per muscle Pectoralis125 Biceps75  FDP50 Brachiorad 50  Hamstrings200 All injections were done after obtaining appropriate EMG activity and after negative drawback for blood. The patient tolerated the procedure well. Post procedure instructions were given. A followup appointment was made.

## 2015-06-14 DIAGNOSIS — Z23 Encounter for immunization: Secondary | ICD-10-CM | POA: Diagnosis not present

## 2015-06-25 ENCOUNTER — Ambulatory Visit: Payer: Medicare Other | Attending: Neurology

## 2015-06-25 DIAGNOSIS — R531 Weakness: Secondary | ICD-10-CM

## 2015-06-25 DIAGNOSIS — R269 Unspecified abnormalities of gait and mobility: Secondary | ICD-10-CM

## 2015-06-25 DIAGNOSIS — Z7409 Other reduced mobility: Secondary | ICD-10-CM | POA: Insufficient documentation

## 2015-06-25 DIAGNOSIS — R2681 Unsteadiness on feet: Secondary | ICD-10-CM | POA: Diagnosis not present

## 2015-06-25 DIAGNOSIS — M6289 Other specified disorders of muscle: Secondary | ICD-10-CM | POA: Insufficient documentation

## 2015-06-25 NOTE — Therapy (Signed)
[facility] [address] [place], [place], [address] Phone: [phone]   Fax:  [phone]  Physical Therapy Treatment  Patient Details  Name: [name] MRN: [id number] Date of Birth: Feb 10, 1926 No Data Recorded  Encounter Date: 06/25/2015      PT End of Session - 06/25/15 1206    Visit Number 17   Number of Visits 25  requesting add'l 8 visits   Date for PT Re-Evaluation 07/25/15  05/25/15-original POC   Authorization Type G-code every 10th visit   PT Start Time 1016   PT Stop Time 1100   PT Time Calculation (min) 44 min   Equipment Utilized During Treatment Gait belt   Activity Tolerance Patient limited by fatigue   Behavior During Therapy [facility] for tasks assessed/performed      Past Medical History  Diagnosis Date  . Cancer ([place])   . Stroke ([place])   . Hearing loss     Past Surgical History  Procedure Laterality Date  . Appendectomy    . Kidney surgery      There were no vitals filed for this visit.  Visit Diagnosis:  Abnormality of gait  Right sided weakness  Unsteadiness  Impaired functional mobility and activity tolerance      Subjective Assessment - 06/25/15 1018    Subjective Pt received botox on 05/31/15. Pt reported he has been performing exercises every day.    Patient is accompained by: Family member  [place] and Herbert Pun (wife)   Pertinent History HTN, hx of CA,    Patient Stated Goals I want to get back to normal.    Currently in Pain? No/denies                         [facility] Adult PT Treatment/Exercise - 06/25/15 1200    Transfers   Transfers Sit to Stand;Stand to Sit   Sit to Stand 4: Min assist   Sit to Stand Details Tactile cues for sequencing;Tactile cues for initiation;Tactile cues for weight shifting;Tactile cues for posture;Tactile cues for placement;Verbal cues for sequencing;Verbal cues for technique;Visual cues/gestures for sequencing;Visual cues  for safe use of DME/AE   Sit to Stand Details (indicate cue type and reason) Cues to improve sequencing, as pt reaching for hemi-walker vs. using w/c armrest to push up into standing.   Stand to Sit 4: Min guard   Stand to Sit Details (indicate cue type and reason) Verbal cues for sequencing;Verbal cues for precautions/safety   Ambulation/Gait   Ambulation/Gait Yes   Ambulation/Gait Assistance 4: Min assist;3: Mod assist   Ambulation/Gait Assistance Details Pt amb. with min A, requiring mod A during last 5-10' of amb. 2/2 fatigue. +2 assist to ensure safety and to guide R LE. Cues for upright posture and sequencing.    Ambulation Distance (Feet) --  25'x2   Assistive device Hemi-walker   Gait Pattern Step-to pattern;Decreased dorsiflexion - right;Right flexed knee in stance;Narrow base of support;Trunk flexed;Decreased stride length   Ambulation Surface Level;Indoor   Chief Technology Officer Yes   Wheelchair Assistance 4: Min assist;5: Supervision;4: Secondary school teacher cues for initiation;Tactile cues for sequencing;Tactile cues for placement;Verbal cues for sequencing;Verbal cues for technique;Verbal cues for safe use of DME/AE;Manual facilitation for placement   Wheelchair Propulsion Left lower extremity;Right lower extremity;Other (comment)  with intermittent use of L UE   Wheelchair Parts Management Needs assistance   Distance --  30'x2  Comments Cues for sequencing and technique, pt had difficulty using L UE but was able to propel w/c with B LEs progressing from min A/min guard to supervision. Pt required min A during turns but S only on straigh trajectory.           Self Care:     PT Education - 06/25/15 1205    Education provided Yes   Education Details PT extensively discussed new freqency/duration (2x/week for 4 weeks) as pt demonstarted progress during gait and w/c propulsion today. PT educated pt on the importance of  continuing HEP and trying to propel w/c at home.   Person(s) Educated Patient;Spouse;Caregiver(s)   Methods Explanation;Demonstration;Tactile cues;Verbal cues   Comprehension Returned demonstration;Verbalized understanding;Need further instruction          PT Short Term Goals - 04/30/15 1335    PT SHORT TERM GOAL #1   Title Pt will be ind. in performing with caregiver assist in order to improve strength, flexibility, and balance. Target date: 04/23/2015   Status Partially Met   PT SHORT TERM GOAL #2   Title Perform BERG, TUG, and gait speed when pt able to stand/amb. for longer durations (if appropriate). Target date: 04/23/15   Status Partially Met   PT SHORT TERM GOAL #3   Title Pt will stand with no UE and min guard for 1 minute in order to perform ADLs. Target date: 04/23/15.   Status Partially Met   PT SHORT TERM GOAL #4   Title Pt will ambulate 72' with LRAD and min A to improve functional mobiltiy. Target date: 04/23/15.   Status Partially Met           PT Long Term Goals - 06/25/15 1210    PT LONG TERM GOAL #1   Title Pt will verbalize understanding of CVA risk factors/sign/symptoms to decrease risk of another CVA. Target date: 05/21/15.   Baseline All on-going goals will be carried over to new POC: 07/23/15.   Status Achieved   PT LONG TERM GOAL #2   Title Pt will ambulate 53' with min guard and LRAD to improve functional mobility. Target date: 07/23/15.   Status On-going   PT LONG TERM GOAL #3   Title Pt will reach 5" outside BOS with supervision, while standing with no UE support, to perform ADLs. Target date: 07/23/15.   Status On-going   PT LONG TERM GOAL #4   Title Pt will propel manual w/c 150' with L UE/L LE with supervision to improve functional mobility. Target date: 07/23/15.   Status On-going               Plan - 06/25/15 1207    Clinical Impression Statement Pt demonstrated progress during gait and w/c propulsion s/p botox injection in R UE and R  LE, as he required less assistance. Pt limited by faituge and "stomach upset" during session but very wiling to participate after rest breaks. Pt's L UE/LE strength WNL, R LE strength improved: hip flex: 4/5, knee ext: 4-/5 (unable to fully extend knee 2/2 knee contracture), knee flex: 4/5, ankle DF: 1/5. Pt would benefit from skilled PT to improve safety during functional mobililty. PT requesitng to renew pt for 2x/week for 4 weeks.    Pt will benefit from skilled therapeutic intervention in order to improve on the following deficits Abnormal gait;Decreased endurance;Decreased activity tolerance;Decreased balance;Decreased knowledge of use of DME;Decreased strength;Impaired UE functional use;Decreased cognition;Decreased coordination;Decreased safety awareness;Impaired flexibility;Postural dysfunction;Decreased range of motion;Decreased mobility   Rehab Potential  Fair   Clinical Impairments Affecting Rehab Potential impaired cognition and B knee contractures   PT Frequency 2x / week   PT Duration 4 weeks   PT Treatment/Interventions ADLs/Self Care Home Management;Neuromuscular re-education;Biofeedback;Electrical Stimulation;Therapeutic activities;Therapeutic exercise;Balance training;Manual techniques;Vestibular;Wheelchair mobility training;Orthotic Fit/Training;Functional mobility training;Patient/family education;Gait training;Stair training;DME Instruction   PT Next Visit Plan Gait with HW and w/c propulsion with B LEs over even/uneven terrain   PT Home Exercise Plan Stretch, strengthening, and balance HEP   Consulted and Agree with Plan of Care Patient;Family member/caregiver   Family Member Consulted caregiver, [name] and wife: Dominic Pea - 19-Jul-2015 October 13, 1209    Functional Assessment Tool Used Gait: 30' with hemiwalker and min-mod A, w/c propulsion 30' with B LEs and min A to supervision   Functional Limitation Mobility: Walking and moving around   Mobility: Walking and Moving  Around Current Status [phone]) At least 60 percent but less than 80 percent impaired, limited or restricted   Mobility: Walking and Moving Around Goal Status [phone]) At least 40 percent but less than 60 percent impaired, limited or restricted      Problem List Patient Active Problem List   Diagnosis Date Noted  . Spastic hemiplegia affecting right dominant side ([place]) 05/31/2015  . Sepsis ([place]) 10/08/2014  . Acute encephalopathy 10/08/2014  . UTI (lower urinary tract infection) 10/08/2014  . Elevated troponin 10/08/2014  . Right hemiparesis ([place]) 07/05/2014  . Dysphagia, post-stroke 07/05/2014  . Dysarthria due to recent cerebral infarction 07/05/2014  . Left pontine stroke ([place]) 07/02/2014  . Embolic stroke involving left carotid artery ([place]) 06/28/2014  . Stroke ([place]) 06/28/2014  . CAP (community acquired pneumonia) 06/28/2014  . Normocytic anemia 06/28/2014  . Essential hypertension 06/28/2014  . Hypokalemia 06/28/2014    Parissa Chiao L July 19, 2015, 12:14 PM  [facility] [address] [place], [place], [address] Phone: [phone]   Fax:  [phone]  Name: ISSAC MOURE MRN: [id number] Date of Birth: [birth year]     Geoffry Paradise, PT,DPT 2015-07-19 12:14 PM Phone: [phone] Fax: [phone]

## 2015-06-27 ENCOUNTER — Ambulatory Visit: Payer: Medicare Other | Attending: Neurology

## 2015-06-27 DIAGNOSIS — G8110 Spastic hemiplegia affecting unspecified side: Secondary | ICD-10-CM | POA: Diagnosis not present

## 2015-06-27 DIAGNOSIS — R531 Weakness: Secondary | ICD-10-CM

## 2015-06-27 DIAGNOSIS — M6289 Other specified disorders of muscle: Secondary | ICD-10-CM | POA: Diagnosis not present

## 2015-06-27 DIAGNOSIS — R2681 Unsteadiness on feet: Secondary | ICD-10-CM | POA: Insufficient documentation

## 2015-06-27 DIAGNOSIS — R269 Unspecified abnormalities of gait and mobility: Secondary | ICD-10-CM | POA: Diagnosis not present

## 2015-06-27 DIAGNOSIS — M79641 Pain in right hand: Secondary | ICD-10-CM | POA: Diagnosis not present

## 2015-06-27 DIAGNOSIS — M25511 Pain in right shoulder: Secondary | ICD-10-CM | POA: Diagnosis not present

## 2015-06-27 DIAGNOSIS — R4189 Other symptoms and signs involving cognitive functions and awareness: Secondary | ICD-10-CM | POA: Insufficient documentation

## 2015-06-27 NOTE — Therapy (Signed)
[facility] [address] [place], [place], [address] Phone: [phone]   Fax:  [phone]  Physical Therapy Treatment  Patient Details  Name: [name] MRN: [id number] Date of Birth: 03/19/1926 No Data Recorded  Encounter Date: 06/27/2015      PT End of Session - 06/27/15 1340    Visit Number 18   Number of Visits 25   Date for PT Re-Evaluation 07/25/15   Authorization Type G-code every 10th visit   PT Start Time 1016   PT Stop Time 1057   PT Time Calculation (min) 41 min   Equipment Utilized During Treatment Gait belt   Activity Tolerance Patient tolerated treatment well   Behavior During Therapy [facility] for tasks assessed/performed      Past Medical History  Diagnosis Date  . Cancer ([place])   . Stroke ([place])   . Hearing loss     Past Surgical History  Procedure Laterality Date  . Appendectomy    . Kidney surgery      There were no vitals filed for this visit.  Visit Diagnosis:  Abnormality of gait  Right sided weakness      Subjective Assessment - 06/27/15 1018    Subjective Pt's caregiver reported he vomited on Tuesday night but feels better today. Pt did try to use B LEs to propel w/c throughout the house but had difficulty traversing on carpet vs. hardwoods.   Patient is accompained by: --  caregiver-Lorraine   Pertinent History HTN, hx of CA,    Patient Stated Goals I want to get back to normal.    Currently in Pain? No/denies                         [facility] Adult PT Treatment/Exercise - 06/27/15 0001    Ambulation/Gait   Ambulation/Gait Yes   Ambulation/Gait Assistance 4: Min assist;3: Mod assist   Ambulation/Gait Assistance Details Cues to improve heel strike, upright posture, step length, sequencing, and wt. shifting onto R LE. Min A during straight trajectory and mod A while traversing turns.   Ambulation Distance (Feet) --  56' and 25'   Assistive device Hemi-walker    Gait Pattern Step-to pattern;Decreased dorsiflexion - right;Right flexed knee in stance;Narrow base of support;Trunk flexed;Decreased stride length   Ambulation Surface Level;Indoor   Chief Technology Officer Yes   Wheelchair Assistance 4: Min assist;5: Conservation officer, historic buildings cues for initiation;Tactile cues for sequencing;Tactile cues for placement;Verbal cues for sequencing;Verbal cues for technique;Verbal cues for safe use of DME/AE;Manual facilitation for placement   Wheelchair Propulsion Left lower extremity;Right lower extremity;Other (comment)   Wheelchair Parts Management Needs assistance   Distance --  74' and 28' (with 3 rest breaks)   Comments Cues to for technique and cues to scoot back in chair for improved posture. Min A to traverse lip of carpet.                  PT Short Term Goals - 04/30/15 1335    PT SHORT TERM GOAL #1   Title Pt will be ind. in performing with caregiver assist in order to improve strength, flexibility, and balance. Target date: 04/23/2015   Status Partially Met   PT SHORT TERM GOAL #2   Title Perform BERG, TUG, and gait speed when pt able to stand/amb. for longer durations (if appropriate). Target date: 04/23/15   Status Partially Met   PT  SHORT TERM GOAL #3   Title Pt will stand with no UE and min guard for 1 minute in order to perform ADLs. Target date: 04/23/15.   Status Partially Met   PT SHORT TERM GOAL #4   Title Pt will ambulate 1' with LRAD and min A to improve functional mobiltiy. Target date: 04/23/15.   Status Partially Met           PT Long Term Goals - 06/25/15 1210    PT LONG TERM GOAL #1   Title Pt will verbalize understanding of CVA risk factors/sign/symptoms to decrease risk of another CVA. Target date: 05/21/15.   Baseline All on-going goals will be carried over to new POC: 07/23/15.   Status Achieved   PT LONG TERM GOAL #2   Title Pt will ambulate 36' with min guard  and LRAD to improve functional mobility. Target date: 07/23/15.   Status On-going   PT LONG TERM GOAL #3   Title Pt will reach 5" outside BOS with supervision, while standing with no UE support, to perform ADLs. Target date: 07/23/15.   Status On-going   PT LONG TERM GOAL #4   Title Pt will propel manual w/c 150' with L UE/L LE with supervision to improve functional mobility. Target date: 07/23/15.   Status On-going               Plan - 06/27/15 1340    Clinical Impression Statement Pt demonstrated progress as he was able to amb. and prople w/c longer distances. Pt continues to require rest breaks 2/2 fatigue. Continue with POC.   Pt will benefit from skilled therapeutic intervention in order to improve on the following deficits Abnormal gait;Decreased endurance;Decreased activity tolerance;Decreased balance;Decreased knowledge of use of DME;Decreased strength;Impaired UE functional use;Decreased cognition;Decreased coordination;Decreased safety awareness;Impaired flexibility;Postural dysfunction;Decreased range of motion;Decreased mobility   Rehab Potential Fair   Clinical Impairments Affecting Rehab Potential impaired cognition and B knee contractures   PT Frequency 2x / week   PT Duration 4 weeks   PT Treatment/Interventions ADLs/Self Care Home Management;Neuromuscular re-education;Biofeedback;Electrical Stimulation;Therapeutic activities;Therapeutic exercise;Balance training;Manual techniques;Vestibular;Wheelchair mobility training;Orthotic Fit/Training;Functional mobility training;Patient/family education;Gait training;Stair training;DME Instruction   PT Next Visit Plan Gait with HW and w/c propulsion with B LEs over even/uneven terrain   PT Home Exercise Plan Stretch, strengthening, and balance HEP   Consulted and Agree with Plan of Care Patient;Family member/caregiver   Family Member Consulted caregiver, [name]         Problem List Patient Active Problem List   Diagnosis  Date Noted  . Spastic hemiplegia affecting right dominant side ([place]) 05/31/2015  . Sepsis ([place]) 10/08/2014  . Acute encephalopathy 10/08/2014  . UTI (lower urinary tract infection) 10/08/2014  . Elevated troponin 10/08/2014  . Right hemiparesis ([place]) 07/05/2014  . Dysphagia, post-stroke 07/05/2014  . Dysarthria due to recent cerebral infarction 07/05/2014  . Left pontine stroke ([place]) 07/02/2014  . Embolic stroke involving left carotid artery ([place]) 06/28/2014  . Stroke ([place]) 06/28/2014  . CAP (community acquired pneumonia) 06/28/2014  . Normocytic anemia 06/28/2014  . Essential hypertension 06/28/2014  . Hypokalemia 06/28/2014    Reesha Debes L 06/27/2015, 1:42 PM  [facility] [address] [place], [place], [address] Phone: [phone]   Fax:  [phone]  Name: [name] MRN: [id number] Date of Birth: [birth year]    Geoffry Paradise, PT,DPT 06/27/2015 1:42 PM Phone: [phone] Fax: [phone]

## 2015-06-28 ENCOUNTER — Encounter: Payer: Self-pay | Admitting: Physical Medicine & Rehabilitation

## 2015-06-28 ENCOUNTER — Ambulatory Visit (HOSPITAL_BASED_OUTPATIENT_CLINIC_OR_DEPARTMENT_OTHER): Payer: Medicare Other | Admitting: Physical Medicine & Rehabilitation

## 2015-06-28 ENCOUNTER — Encounter: Payer: Medicare Other | Attending: Physical Medicine & Rehabilitation

## 2015-06-28 VITALS — BP 137/74 | HR 73 | Resp 14

## 2015-06-28 DIAGNOSIS — G8111 Spastic hemiplegia affecting right dominant side: Secondary | ICD-10-CM

## 2015-06-28 DIAGNOSIS — G8110 Spastic hemiplegia affecting unspecified side: Secondary | ICD-10-CM | POA: Diagnosis not present

## 2015-06-28 DIAGNOSIS — I6350 Cerebral infarction due to unspecified occlusion or stenosis of unspecified cerebral artery: Secondary | ICD-10-CM | POA: Diagnosis not present

## 2015-06-28 MED ORDER — TIZANIDINE HCL 4 MG PO TABS: 4.0000 mg | ORAL_TABLET | Freq: Three times a day (TID) | ORAL | Status: DC

## 2015-06-28 NOTE — Patient Instructions (Addendum)
Not ready for arm trough  Not ready for arm wedged  Use hand splint at all times however will need to monitor the skin to make sure no red areas develop  This will likely take several months since he has had such severe spasticity and abnormal arm position

## 2015-06-28 NOTE — Progress Notes (Signed)
Phenol neurolysis of the Right musculo- cutaneous nerve  Indication: Severe spasticity in the arm flexor muscles which is not responding to medical management and other conservative care and interfering with functional use and hygiene.  Informed consent was obtained after describing the risks and benefits of the procedure with the patient this includes bleeding bruising and infection as well as medication side effects. The patient elected to proceed and has given written consent. Patient placed in a supine position on the exam table. External DC stimulation was applied to the axilla using a nerve stimulator. Arm flexion twitch was obtained. The axillary region was prepped with Betadine and then entered with a 22-gauge 40 mm needle electrode under electrical stimulation guidance. Arm flexion which was obtained and confirmed. Then 4 cc of 5% phenol were injected. The patient tolerated procedure well. Post procedure instructions and followup visit were given. 

## 2015-07-01 DIAGNOSIS — R1110 Vomiting, unspecified: Secondary | ICD-10-CM | POA: Diagnosis not present

## 2015-07-01 DIAGNOSIS — R195 Other fecal abnormalities: Secondary | ICD-10-CM | POA: Diagnosis not present

## 2015-07-01 DIAGNOSIS — I6930 Unspecified sequelae of cerebral infarction: Secondary | ICD-10-CM | POA: Diagnosis not present

## 2015-07-01 DIAGNOSIS — E785 Hyperlipidemia, unspecified: Secondary | ICD-10-CM | POA: Diagnosis not present

## 2015-07-01 DIAGNOSIS — I10 Essential (primary) hypertension: Secondary | ICD-10-CM | POA: Diagnosis not present

## 2015-07-02 ENCOUNTER — Ambulatory Visit: Payer: Medicare Other

## 2015-07-02 ENCOUNTER — Telehealth: Payer: Self-pay | Admitting: *Deleted

## 2015-07-02 DIAGNOSIS — M25511 Pain in right shoulder: Secondary | ICD-10-CM | POA: Diagnosis not present

## 2015-07-02 DIAGNOSIS — R269 Unspecified abnormalities of gait and mobility: Secondary | ICD-10-CM | POA: Diagnosis not present

## 2015-07-02 DIAGNOSIS — G8110 Spastic hemiplegia affecting unspecified side: Secondary | ICD-10-CM | POA: Diagnosis not present

## 2015-07-02 DIAGNOSIS — M79641 Pain in right hand: Secondary | ICD-10-CM | POA: Diagnosis not present

## 2015-07-02 DIAGNOSIS — R2681 Unsteadiness on feet: Secondary | ICD-10-CM | POA: Diagnosis not present

## 2015-07-02 DIAGNOSIS — R531 Weakness: Secondary | ICD-10-CM

## 2015-07-02 DIAGNOSIS — M6289 Other specified disorders of muscle: Secondary | ICD-10-CM | POA: Diagnosis not present

## 2015-07-02 NOTE — Telephone Encounter (Signed)
Saw Dr Letta Pate Friday 06/28/15.  Tizanidine was increased to 4 mg tid.  It has changed him personality wise--agitated and angry.  She held it on Sunday and he was too tired and  having frequent stools(12-14) and was wiped out.  On Monday she gave him the 2 mg again evening only and he is till so drowsy.  Yesterday and this morning had to go back to bed in the morning due to drowsiness. Phys T this morning was a "total wash". Didn't walk.  Last week he was standing with 25% assistance and today he was not standing even with 75% assistance. She would like to know what to do.

## 2015-07-02 NOTE — Telephone Encounter (Signed)
Discontinue tizanidine

## 2015-07-02 NOTE — Therapy (Signed)
[facility] [address] [place], [place], [address] Phone: [phone]   Fax:  [phone]  Physical Therapy Treatment  Patient Details  Name: [name] MRN: [id number] Date of Birth: 01/08/1926 No Data Recorded  Encounter Date: 07/02/2015      PT End of Session - 07/02/15 1333    Visit Number 19   Number of Visits 25   Date for PT Re-Evaluation 07/25/15   Authorization Type G-code every 10th visit   PT Start Time 1101   PT Stop Time 1142   PT Time Calculation (min) 41 min   Equipment Utilized During Treatment Gait belt   Activity Tolerance Patient limited by fatigue   Behavior During Therapy [facility] for tasks assessed/performed      Past Medical History  Diagnosis Date  . Cancer ([place])   . Stroke ([place])   . Hearing loss     Past Surgical History  Procedure Laterality Date  . Appendectomy    . Kidney surgery      There were no vitals filed for this visit.  Visit Diagnosis:  Right sided weakness  Unsteadiness  Abnormality of gait      Subjective Assessment - 07/02/15 1104    Subjective Pt's wife reported this weekend was tough on pt, as MD increased Tizanidine dosage which caused pt to experience diarrhea. Pt denied falls since last session.   Patient is accompained by: Family member  caregiver and wife   Pertinent History HTN, hx of CA,    Patient Stated Goals I want to get back to normal.    Currently in Pain? No/denies                         [facility] Adult PT Treatment/Exercise - 07/02/15 1106    Transfers   Transfers Sit to Stand;Stand to Sit   Sit to Stand 4: Min assist;3: Mod assist;With upper extremity assist;From chair/3-in-1   Sit to Stand Details Tactile cues for initiation;Tactile cues for sequencing;Tactile cues for weight shifting;Tactile cues for posture;Tactile cues for placement;Verbal cues for sequencing;Verbal cues for technique;Manual facilitation for  placement   Sit to Stand Details (indicate cue type and reason) Pt performed x5 reps. Cues to improve ant. wt. shifting. Pt required rest breaks 2/2 fatigue.   Stand to Sit 4: Min guard;4: Min assist   Stand to Sit Details (indicate cue type and reason) Verbal cues for technique;Verbal cues for sequencing;Tactile cues for sequencing   Stand to Sit Details x5 reps. Cues to improve eccentric control.   Ambulation/Gait   Ambulation/Gait --   Chief Technology Officer Yes   Wheelchair Assistance 4: Min assist;5: Conservation officer, historic buildings cues for initiation;Tactile cues for sequencing;Verbal cues for sequencing;Verbal cues for technique;Manual facilitation for placement   Wheelchair Propulsion Both lower extermities   Wheelchair Parts Management Supervision/cueing   Distance --  77' and 25'   Comments Cues to propel in straight trajectory and how to place LEs to negotiate obstacles. Pt required two rest breaks 2/2 fatigue.   Balance   Balance Assessed Yes             Balance Exercises - 07/02/15 1330    Balance Exercises: Standing   Standing Eyes Opened Wide (BOA);3 reps;30 secs   Other Standing Exercises Performed with min A to max A: feet apart (see above) and marching in place 2x10 reps/LE. cues to improve ant. and lateral  wt. shifting, one UE on hemi-walker for support.            PT Education - 07/02/15 1331    Education provided Yes   Education Details PT strongly encouraged pt's wife to inform MD that she ceased pt's medication 2/2 side effects. PT encouraged pt to attempt HEP again, as he reported he was unable to perform HEP 2/2 upset stomach and fatigue over the weekend.    Person(s) Educated Patient;Spouse;Caregiver(s)   Methods Explanation   Comprehension Verbalized understanding          PT Short Term Goals - 04/30/15 1335    PT SHORT TERM GOAL #1   Title Pt will be ind. in performing with caregiver assist in order to  improve strength, flexibility, and balance. Target date: 04/23/2015   Status Partially Met   PT SHORT TERM GOAL #2   Title Perform BERG, TUG, and gait speed when pt able to stand/amb. for longer durations (if appropriate). Target date: 04/23/15   Status Partially Met   PT SHORT TERM GOAL #3   Title Pt will stand with no UE and min guard for 1 minute in order to perform ADLs. Target date: 04/23/15.   Status Partially Met   PT SHORT TERM GOAL #4   Title Pt will ambulate 78' with LRAD and min A to improve functional mobiltiy. Target date: 04/23/15.   Status Partially Met           PT Long Term Goals - 06/25/15 1210    PT LONG TERM GOAL #1   Title Pt will verbalize understanding of CVA risk factors/sign/symptoms to decrease risk of another CVA. Target date: 05/21/15.   Baseline All on-going goals will be carried over to new POC: 07/23/15.   Status Achieved   PT LONG TERM GOAL #2   Title Pt will ambulate 69' with min guard and LRAD to improve functional mobility. Target date: 07/23/15.   Status On-going   PT LONG TERM GOAL #3   Title Pt will reach 5" outside BOS with supervision, while standing with no UE support, to perform ADLs. Target date: 07/23/15.   Status On-going   PT LONG TERM GOAL #4   Title Pt will propel manual w/c 150' with L UE/L LE with supervision to improve functional mobility. Target date: 07/23/15.   Status On-going               Plan - 07/02/15 1333    Clinical Impression Statement Pt limited by fatigue today, as pt reported fatigue and diarrhea 2/2 incr. of Tizanidine this weekend. Pt required frequent seated rest breaks and incr. time during all activities 2/2 fatigue. Therefore, PT session mainly focused on w/c mobility and standing balance, as pt was not safe to amb. today, as he required up to max A to maintain balance during marching in place. Continue with POC.   Pt will benefit from skilled therapeutic intervention in order to improve on the following  deficits Abnormal gait;Decreased endurance;Decreased activity tolerance;Decreased balance;Decreased knowledge of use of DME;Decreased strength;Impaired UE functional use;Decreased cognition;Decreased coordination;Decreased safety awareness;Impaired flexibility;Postural dysfunction;Decreased range of motion;Decreased mobility   Rehab Potential Fair   Clinical Impairments Affecting Rehab Potential impaired cognition and B knee contractures   PT Frequency 2x / week   PT Duration 4 weeks   PT Treatment/Interventions ADLs/Self Care Home Management;Neuromuscular re-education;Biofeedback;Electrical Stimulation;Therapeutic activities;Therapeutic exercise;Balance training;Manual techniques;Vestibular;Wheelchair mobility training;Orthotic Fit/Training;Functional mobility training;Patient/family education;Gait training;Stair training;DME Instruction   PT Next Visit Plan Gait with HW and  w/c propulsion with B LEs over even/uneven terrain   PT Home Exercise Plan Stretch, strengthening, and balance HEP   Consulted and Agree with Plan of Care Patient;Family member/caregiver   Family Member Consulted caregiver, [name]         Problem List Patient Active Problem List   Diagnosis Date Noted  . Spastic hemiplegia affecting right dominant side ([place]) 05/31/2015  . Sepsis ([place]) 10/08/2014  . Acute encephalopathy 10/08/2014  . UTI (lower urinary tract infection) 10/08/2014  . Elevated troponin 10/08/2014  . Right hemiparesis ([place]) 07/05/2014  . Dysphagia, post-stroke 07/05/2014  . Dysarthria due to recent cerebral infarction 07/05/2014  . Left pontine stroke ([place]) 07/02/2014  . Embolic stroke involving left carotid artery ([place]) 06/28/2014  . Stroke ([place]) 06/28/2014  . CAP (community acquired pneumonia) 06/28/2014  . Normocytic anemia 06/28/2014  . Essential hypertension 06/28/2014  . Hypokalemia 06/28/2014    Missy Baksh L 07/02/2015, 1:36 PM  [facility] [address] [place], [place], [address] Phone: [phone]   Fax:  [phone]  Name: BIRL LOBELLO MRN: [id number] Date of Birth: 08/02/1925    Geoffry Paradise, PT,DPT 07/02/2015 1:36 PM Phone: [phone] Fax: [phone]

## 2015-07-03 NOTE — Telephone Encounter (Signed)
Spoke with [name], advised for pt to stop Tizanidine

## 2015-07-05 ENCOUNTER — Ambulatory Visit: Payer: Medicare Other

## 2015-07-05 ENCOUNTER — Telehealth: Payer: Self-pay

## 2015-07-05 DIAGNOSIS — R2681 Unsteadiness on feet: Secondary | ICD-10-CM

## 2015-07-05 DIAGNOSIS — M79641 Pain in right hand: Secondary | ICD-10-CM | POA: Diagnosis not present

## 2015-07-05 DIAGNOSIS — M25511 Pain in right shoulder: Secondary | ICD-10-CM | POA: Diagnosis not present

## 2015-07-05 DIAGNOSIS — G8110 Spastic hemiplegia affecting unspecified side: Secondary | ICD-10-CM | POA: Diagnosis not present

## 2015-07-05 DIAGNOSIS — R269 Unspecified abnormalities of gait and mobility: Secondary | ICD-10-CM | POA: Diagnosis not present

## 2015-07-05 DIAGNOSIS — M6289 Other specified disorders of muscle: Secondary | ICD-10-CM | POA: Diagnosis not present

## 2015-07-05 DIAGNOSIS — R531 Weakness: Secondary | ICD-10-CM

## 2015-07-05 NOTE — Therapy (Signed)
[facility] [address] [place], [place], [address] Phone: [phone]   Fax:  [phone]  Physical Therapy Treatment  Patient Details  Name: [name] MRN: [id number] Date of Birth: 12-Jul-1926 No Data Recorded  Encounter Date: 07/05/2015      PT End of Session - 07/05/15 1112    Visit Number 20   Number of Visits 25   Date for PT Re-Evaluation 07/25/15   Authorization Type G-code every 10th visit   PT Start Time 1016   PT Stop Time 1058   PT Time Calculation (min) 42 min   Equipment Utilized During Treatment Gait belt   Activity Tolerance Patient tolerated treatment well   Behavior During Therapy [name] for tasks assessed/performed      Past Medical History  Diagnosis Date  . Cancer ([place])   . Stroke ([place])   . Hearing loss     Past Surgical History  Procedure Laterality Date  . Appendectomy    . Kidney surgery      There were no vitals filed for this visit.  Visit Diagnosis:  Abnormality of gait  Unsteadiness  Right sided weakness      Subjective Assessment - 07/05/15 1022    Subjective Pt's wife reports the MD office has not called back regarding to medication. However, pt feels better than last session.    Patient is accompained by: Family member   Pertinent History HTN, hx of CA,    Patient Stated Goals I want to get back to normal.    Currently in Pain? No/denies                         [facility] Adult PT Treatment/Exercise - 07/05/15 1023    Ambulation/Gait   Ambulation/Gait Yes   Ambulation/Gait Assistance 4: Min assist;3: Mod assist;2: Max assist   Ambulation/Gait Assistance Details Pt progressed from max A to min-mod A during amb. with cues to engage glute max musculature in order to obtain full upright position. Pt required rest breaks 2/2 fatigue. +2 for safety, PT tech to guard and PT to assist R LE during gait.   Ambulation Distance (Feet) --  15', 32'x4   Assistive device Hemi-walker   Gait Pattern Step-to pattern;Decreased dorsiflexion - right;Right flexed knee in stance;Narrow base of support;Trunk flexed;Decreased stride length   Ambulation Surface Level;Indoor   Balance   Balance Assessed Yes   Static Standing Balance   Static Standing - Balance Support Right upper extremity supported   Static Standing - Level of Assistance 4: Min assist;Other (comment)  min guard   Static Standing - Comment/# of Minutes In // bars with L UE support, cues to improve upright posture and to activate glute max muscles. Pt able to progress from min A to min guard with cues, static hold with feet apart for 30 sec. and 1 minute.                PT Education - 07/05/15 1111    Education provided Yes   Education Details PT educated pt on activating glute max muscles to improve upright posture and decr. posterior lean while standing.   Person(s) Educated Patient;Spouse;Caregiver(s)   Methods Demonstration;Explanation;Tactile cues;Verbal cues   Comprehension Verbalized understanding;Returned demonstration;Need further instruction          PT Short Term Goals - 04/30/15 1335    PT SHORT TERM GOAL #1   Title Pt will be ind. in performing with caregiver  assist in order to improve strength, flexibility, and balance. Target date: 04/23/2015   Status Partially Met   PT SHORT TERM GOAL #2   Title Perform BERG, TUG, and gait speed when pt able to stand/amb. for longer durations (if appropriate). Target date: 04/23/15   Status Partially Met   PT SHORT TERM GOAL #3   Title Pt will stand with no UE and min guard for 1 minute in order to perform ADLs. Target date: 04/23/15.   Status Partially Met   PT SHORT TERM GOAL #4   Title Pt will ambulate 44' with LRAD and min A to improve functional mobiltiy. Target date: 04/23/15.   Status Partially Met           PT Long Term Goals - 06/25/15 1210    PT LONG TERM GOAL #1   Title Pt will verbalize understanding of  CVA risk factors/sign/symptoms to decrease risk of another CVA. Target date: 05/21/15.   Baseline All on-going goals will be carried over to new POC: 07/23/15.   Status Achieved   PT LONG TERM GOAL #2   Title Pt will ambulate 55' with min guard and LRAD to improve functional mobility. Target date: 07/23/15.   Status On-going   PT LONG TERM GOAL #3   Title Pt will reach 5" outside BOS with supervision, while standing with no UE support, to perform ADLs. Target date: 07/23/15.   Status On-going   PT LONG TERM GOAL #4   Title Pt will propel manual w/c 150' with L UE/L LE with supervision to improve functional mobility. Target date: 07/23/15.   Status On-going               Plan - 07/05/15 1112    Clinical Impression Statement Pt demonstrated progress, as he was able to stand and amb. with less assist when he activated glute max musculature. Pt continues to require seated rest breaks 2/2 fatigue. Pt would continue to benefit from skilled PT to improve safety during functional mobility.   Pt will benefit from skilled therapeutic intervention in order to improve on the following deficits Abnormal gait;Decreased endurance;Decreased activity tolerance;Decreased balance;Decreased knowledge of use of DME;Decreased strength;Impaired UE functional use;Decreased cognition;Decreased coordination;Decreased safety awareness;Impaired flexibility;Postural dysfunction;Decreased range of motion;Decreased mobility   Rehab Potential Fair   Clinical Impairments Affecting Rehab Potential impaired cognition and B knee contractures   PT Frequency 2x / week   PT Duration 4 weeks   PT Treatment/Interventions ADLs/Self Care Home Management;Neuromuscular re-education;Biofeedback;Electrical Stimulation;Therapeutic activities;Therapeutic exercise;Balance training;Manual techniques;Vestibular;Wheelchair mobility training;Orthotic Fit/Training;Functional mobility training;Patient/family education;Gait training;Stair  training;DME Instruction   PT Next Visit Plan Gait with HW and w/c propulsion with B LEs over even/uneven terrain   PT Home Exercise Plan Stretch, strengthening, and balance HEP   Consulted and Agree with Plan of Care Patient;Family member/caregiver   Family Member Consulted caregiver, [name]         Problem List Patient Active Problem List   Diagnosis Date Noted  . Spastic hemiplegia affecting right dominant side ([place]) 05/31/2015  . Sepsis ([place]) 10/08/2014  . Acute encephalopathy 10/08/2014  . UTI (lower urinary tract infection) 10/08/2014  . Elevated troponin 10/08/2014  . Right hemiparesis ([place]) 07/05/2014  . Dysphagia, post-stroke 07/05/2014  . Dysarthria due to recent cerebral infarction 07/05/2014  . Left pontine stroke ([place]) 07/02/2014  . Embolic stroke involving left carotid artery ([place]) 06/28/2014  . Stroke ([place]) 06/28/2014  . CAP (community acquired pneumonia) 06/28/2014  . Normocytic anemia 06/28/2014  . Essential hypertension 06/28/2014  .  Hypokalemia 06/28/2014    Miller,Jennifer L 07/05/2015, 11:14 AM  [facility] [address] [place], [place], [address] Phone: [phone]   Fax:  [phone]  Name: [name] MRN: [id number] Date of Birth: [birth year]    Geoffry Paradise, PT,DPT 07/05/2015 11:14 AM Phone: [phone] Fax: [phone]

## 2015-07-05 NOTE — Telephone Encounter (Signed)
I don't think his symptoms are related to the phenol.tthis really has no systemic side effects. Only local effects He has been prescribed oral muscle relaxer which has caused some drowsiness and I asked her to stop this. Patient has very severe spasticity and is very difficult to manage from this standpoint.

## 2015-07-05 NOTE — Telephone Encounter (Signed)
Patients wife Herbert Pun called in and stated that patient received a Phenyl injection last week. She states that you took him off of the muscle relaxer. She states that patient is unable to stand to do Physical Therapy and she states that she believes this is caused by the Phenyl. She wanted to know if there was a way to counteract the Phenyl injection. She states that patient only wants to sleep and doesn't want to get out of bed. She states that she looked online and saw that one of the major and serious side effects of a phenyl injection is that drowsiness and she wanted to know what she could do to help him. Patient wife states that she is very concerned about patient. Please advise. Thanks

## 2015-07-08 DIAGNOSIS — C61 Malignant neoplasm of prostate: Secondary | ICD-10-CM | POA: Diagnosis not present

## 2015-07-08 NOTE — Telephone Encounter (Signed)
Left message to call back. [facility]

## 2015-07-09 ENCOUNTER — Ambulatory Visit: Payer: Medicare Other | Admitting: Physical Therapy

## 2015-07-09 ENCOUNTER — Ambulatory Visit: Payer: Medicare Other | Admitting: Occupational Therapy

## 2015-07-09 DIAGNOSIS — R269 Unspecified abnormalities of gait and mobility: Secondary | ICD-10-CM

## 2015-07-09 DIAGNOSIS — R2681 Unsteadiness on feet: Secondary | ICD-10-CM

## 2015-07-09 DIAGNOSIS — R4189 Other symptoms and signs involving cognitive functions and awareness: Secondary | ICD-10-CM

## 2015-07-09 DIAGNOSIS — M6289 Other specified disorders of muscle: Secondary | ICD-10-CM | POA: Diagnosis not present

## 2015-07-09 DIAGNOSIS — R531 Weakness: Secondary | ICD-10-CM

## 2015-07-09 DIAGNOSIS — M79641 Pain in right hand: Secondary | ICD-10-CM

## 2015-07-09 DIAGNOSIS — M25511 Pain in right shoulder: Secondary | ICD-10-CM

## 2015-07-09 DIAGNOSIS — G8110 Spastic hemiplegia affecting unspecified side: Secondary | ICD-10-CM

## 2015-07-09 NOTE — Therapy (Signed)
[facility] [address] [place], [place], [address] Phone: [phone]   Fax:  [phone]  Physical Therapy Treatment  Patient Details  Name: [name] MRN: [id number] Date of Birth: April 09, 1926 No Data Recorded  Encounter Date: 07/09/2015      PT End of Session - 07/09/15 1426    Visit Number 21   Number of Visits 25   Date for PT Re-Evaluation 07/25/15   Authorization Type G-code every 10th visit   PT Start Time 1315   PT Stop Time 1401   PT Time Calculation (min) 46 min   Equipment Utilized During Treatment Gait belt   Activity Tolerance Patient tolerated treatment well;Patient limited by fatigue  all gait trials ended, per pt request, due to fatigue   Behavior During Therapy [facility] for tasks assessed/performed      Past Medical History  Diagnosis Date  . Cancer ([place])   . Stroke ([place])   . Hearing loss     Past Surgical History  Procedure Laterality Date  . Appendectomy    . Kidney surgery      There were no vitals filed for this visit.  Visit Diagnosis:  Abnormality of gait  Unsteadiness  Right sided weakness      Subjective Assessment - 07/09/15 1409    Subjective Wife feels as though issues secondary to medication have resolved since recent medication change. Wife inquiring as to whether or not patient should continue current PT HEP.   Patient is accompained by: Family member   Pertinent History HTN, hx of CA,    Patient Stated Goals I want to get back to normal.    Currently in Pain? No/denies                         [facility] Adult PT Treatment/Exercise - 07/09/15 0001    Transfers   Transfers Sit to Stand;Stand to Sit   Sit to Stand 4: Min assist   Sit to Stand Details (indicate cue type and reason) Sit > stand from w/c to hemi walker wiith verbal cueing for setup, tactile cueing at R knee for proprioceptive input/WB, and manual facilitation of full anterior weight  shift. Pt exhibits tendency to push posteriorly when given tactile input.   Stand to Sit 4: Min assist   Stand to Sit Details x3 with hemi walker, verbal/tactile cueing for anterior weight shift.   Ambulation/Gait   Ambulation/Gait Yes   Ambulation/Gait Assistance 4: Min assist;3: Mod assist  +2A for w/c follow, as pt tends to need to sit quickly   Ambulation/Gait Assistance Details Gait x53', x27', and 27' (seated rest breaks between trials) with hemi walker and manual repositioning of RLE (due to excessive RLE adduction) during RLE advancement, tactile cueing intially provided at L knee for proprioceptive input, verbal/tactile cueing for proper placement of hemi walker (tendency toward hw too close to BOS), verbal/tactile cueing for upright posture, and verbal cueing for increased RLE step length for step through pattern, tactile cueing for anterior weight shift/lateral weight shift to R.    Ambulation Distance (Feet) 53 Feet  then x27' and x23'   Assistive device Hemi-walker  R AFO   Gait Pattern Step-to pattern;Decreased dorsiflexion - right;Right flexed knee in stance;Narrow base of support;Trunk flexed;Decreased arm swing - right;Decreased step length - left;Decreased stance time - right;Decreased weight shift to right;Step-through pattern;Left flexed knee in stance   Ambulation Surface Level;Indoor   Exercises   Exercises  Other Exercises   Other Exercises  During seated rest breaks, performed prolonged passive stretch of L hamstrings 3 x 1-minute holds ffor spasticity/contracture management. During rest breaks, also provided verbal/demo cueing for postural awareness while pt seated in w/c due to lateral trunk flexion to R side. Even with use of mirror for visual feedback, pt demonstrated poor awareness of postural impairments.                PT Education - 07/09/15 1414    Education provided Yes   Education Details Recommended pt continue current HEP, as exercises are still very  appropriate.   Person(s) Educated Patient;Spouse;Caregiver(s)   Methods Explanation   Comprehension Verbalized understanding          PT Short Term Goals - 04/30/15 1335    PT SHORT TERM GOAL #1   Title Pt will be ind. in performing with caregiver assist in order to improve strength, flexibility, and balance. Target date: 04/23/2015   Status Partially Met   PT SHORT TERM GOAL #2   Title Perform BERG, TUG, and gait speed when pt able to stand/amb. for longer durations (if appropriate). Target date: 04/23/15   Status Partially Met   PT SHORT TERM GOAL #3   Title Pt will stand with no UE and min guard for 1 minute in order to perform ADLs. Target date: 04/23/15.   Status Partially Met   PT SHORT TERM GOAL #4   Title Pt will ambulate 63' with LRAD and min A to improve functional mobiltiy. Target date: 04/23/15.   Status Partially Met           PT Long Term Goals - 06/25/15 1210    PT LONG TERM GOAL #1   Title Pt will verbalize understanding of CVA risk factors/sign/symptoms to decrease risk of another CVA. Target date: 05/21/15.   Baseline All on-going goals will be carried over to new POC: 07/23/15.   Status Achieved   PT LONG TERM GOAL #2   Title Pt will ambulate 25' with min guard and LRAD to improve functional mobility. Target date: 07/23/15.   Status On-going   PT LONG TERM GOAL #3   Title Pt will reach 5" outside BOS with supervision, while standing with no UE support, to perform ADLs. Target date: 07/23/15.   Status On-going   PT LONG TERM GOAL #4   Title Pt will propel manual w/c 150' with L UE/L LE with supervision to improve functional mobility. Target date: 07/23/15.   Status On-going               Plan - 07/09/15 1428    Clinical Impression Statement Skilled session focused on gait training using hemi walker. Gait distance limited by pt fatigue. Gait stability/independence limited by knee flexion contractures, impaired midline orientation, and increased tone  in RLE.   Pt will benefit from skilled therapeutic intervention in order to improve on the following deficits Abnormal gait;Decreased endurance;Decreased activity tolerance;Decreased balance;Decreased knowledge of use of DME;Decreased strength;Impaired UE functional use;Decreased cognition;Decreased coordination;Decreased safety awareness;Impaired flexibility;Postural dysfunction;Decreased range of motion;Decreased mobility   Rehab Potential Fair   Clinical Impairments Affecting Rehab Potential impaired cognition and B knee contractures   PT Frequency 2x / week   PT Duration 4 weeks   PT Treatment/Interventions ADLs/Self Care Home Management;Neuromuscular re-education;Biofeedback;Electrical Stimulation;Therapeutic activities;Therapeutic exercise;Balance training;Manual techniques;Vestibular;Wheelchair mobility training;Orthotic Fit/Training;Functional mobility training;Patient/family education;Gait training;Stair training;DME Instruction   PT Next Visit Plan Continue gait with hemi walker, w/c propulsion with BLE's.   PT  Home Exercise Plan Stretch, strengthening, and balance HEP   Consulted and Agree with Plan of Care Patient;Family member/caregiver   Family Member Consulted caregiver, [name]        Problem List Patient Active Problem List   Diagnosis Date Noted  . Spastic hemiplegia affecting right dominant side ([place]) 05/31/2015  . Sepsis ([place]) 10/08/2014  . Acute encephalopathy 10/08/2014  . UTI (lower urinary tract infection) 10/08/2014  . Elevated troponin 10/08/2014  . Right hemiparesis ([place]) 07/05/2014  . Dysphagia, post-stroke 07/05/2014  . Dysarthria due to recent cerebral infarction 07/05/2014  . Left pontine stroke ([place]) 07/02/2014  . Embolic stroke involving left carotid artery ([place]) 06/28/2014  . Stroke ([place]) 06/28/2014  . CAP (community acquired pneumonia) 06/28/2014  . Normocytic anemia 06/28/2014  . Essential hypertension 06/28/2014  . Hypokalemia 06/28/2014    Billie Ruddy, PT, DPT [facility] [address] [place], [place], [address] Phone: [phone]   Fax:  [phone] 07/09/2015, 2:31 PM   Name: SHENANDOAH VANDERGRIFF MRN: [id number] Date of Birth: [birth year]

## 2015-07-09 NOTE — Therapy (Signed)
[facility] [address] [place], [place], [address] Phone: [phone]   Fax:  [phone]  Occupational Therapy Evaluation  Patient Details  Name: [name] MRN: [id number] Date of Birth: 12-05-1925 Referring Provider: Dr. Letta Pate.  Encounter Date: 07/09/2015      OT End of Session - 07/09/15 1441    Visit Number 1   Number of Visits 5   Date for OT Re-Evaluation 08/07/14   Authorization Type medicare will need g code and progress note every 10th visit   Authorization Time Period 9 days-07/09/15-09/06/14   Authorization - Visit Number 1   Authorization - Number of Visits 10   OT Start Time [id number]   OT Stop Time 1314   OT Time Calculation (min) 38 min   Activity Tolerance Patient tolerated treatment well   Behavior During Therapy WFL for tasks assessed/performed      Past Medical History  Diagnosis Date  . Cancer ([place])   . Stroke ([place])   . Hearing loss     Past Surgical History  Procedure Laterality Date  . Appendectomy    . Kidney surgery      There were no vitals filed for this visit.  Visit Diagnosis:  Spastic hemiplegia affecting nondominant side (HCC)  Pain in joint, shoulder region, right  Pain, hand joint, right  Impaired cognition  Right sided weakness      Subjective Assessment - 07/09/15 1431    Subjective  Pt previously seen by OT and d/c on 05/22/15, received botox injection to RUE on 05/31/15, then phenol injection to RUE on 06/28/15.   Patient is accompained by: Family member   Pertinent History L CVA, small vessel disease and atrophy, see epic snapshot   Patient Stated Goals upodate HEP and splint modifications PRN.   Currently in Pain? No/denies           [facility] OT Assessment - 07/09/15 0001    Assessment   Diagnosis L CVA   Referring Provider Dr. Letta Pate.   Onset Date 06/28/15  phenol injection   Assessment Pt with history of CVA on 06/27/14, s/p botox  injection on 05/31/15, then phenol injection to RUE flexors due to spsticity. Pt returns to occupational therapy for updated HEP and splint modifications PRN..    Prior Therapy outpatient OT d/c on 05/22/15   Precautions   Precautions Fall   Restrictions   Weight Bearing Restrictions No   Home  Environment   Family/patient expects to be discharged to: Private residence   Living Arrangements Spouse/significant other   Available Help at Discharge Available 24 hours/day   Home Layout Two level   [place] Handicapped height   Prior Function   Level of [place] Retired   ADL   Grooming Supervision/safety   Upper Body Bathing Moderate assistance   Lower Body Bathing Maximal assistance   Upper Body Dressing Maximal assistance   Lower Body Dressing Maximal assistance   Written Expression   Dominant Hand Left   Vision - History   Baseline Vision Wears glasses only for reading   Cognition   Overall Cognitive Status Impaired/Different from baseline   Sensation   Light Touch Appears Intact   Coordination   Gross Motor Movements are Fluid and Coordinated No   Fine Motor Movements are Fluid and Coordinated No   PROM   Overall PROM  Deficits   Overall PROM Comments severe contractures present at elbow,  wrist    PROM Assessment Site Shoulder;Elbow;Wrist;Finger   Right/Left Shoulder Right   Right Shoulder Flexion 50 Degrees   Right Shoulder ABduction 75 Degrees   Right/Left Elbow Right   Right Elbow Flexion 120   Right Elbow Extension -60   Right/Left Wrist Right   Right Wrist Extension -40 Degrees   Right Wrist Flexion -75 Degrees  at rest   Right/Left Finger Right   Right Composite Finger Extension --  90%                              OT Long Term Goals - Aug 04, 2015 1557    OT LONG TERM GOAL #1   Title Pt and caregiver will be mod I with  HEP    Baseline due 08/08/15   Time 4   Period  Weeks   Status New   OT LONG TERM GOAL #2   Title Pt/ caregiver will verbalize understanding of bed positioning to minimize risk for contracture/ pain   Status New   OT LONG TERM GOAL #3   Title Pt/ caregiver will verbalize understanding of RUE posistioning to minimize risk for further contracture and pain including splint wear, care and precautions PRN.   Time 4   Period Weeks   Status New   OT LONG TERM GOAL #4   Title --------------------------   OT LONG TERM GOAL #5   Title --------------------------------------   OT LONG TERM GOAL #6   Title ------------------------------------------------   OT LONG TERM GOAL #7   Title -----------------------------------------------------               Plan - 04-Aug-2015 1442    Clinical Impression Statement Pt s/p CVA 06/27/14, who was previously d/c from therapy on 05/22/15, received botox injection to RUE on 05/31/15, then phenol injection on 06/28/15. Pt can benefit from skilled OT to update HEP, provide caregiver education and modify splint PRN   Pt will benefit from skilled therapeutic intervention in order to improve on the following deficits (Retired) Abnormal gait;Decreased activity tolerance;Decreased balance;Decreased cognition;Decreased mobility;Decreased range of motion;Decreased safety awareness;Decreased strength;Impaired UE functional use;Impaired tone;Pain   Rehab Potential Fair   Clinical Impairments Affecting Rehab Potential cognition, severity of defiicits   OT Frequency 1x / week   OT Duration 4 weeks   OT Treatment/Interventions Self-care/ADL training;Ultrasound;Traction;Moist Heat;Fluidtherapy;DME and/or AE instruction;Neuromuscular education;Therapeutic exercise;Functional Mobility Training;Manual Therapy;Passive range of motion;Splinting;Therapeutic activities;Balance training;Patient/family education   Plan review/ progress HEP, modify splint PRN and educate regarding bed positioning over next few visits   Consulted and  Agree with Plan of Care Patient;Family member/caregiver   Family Member Consulted PCA, wife          G-Codes - 04-Aug-2015 1601    Functional Assessment Tool Used clinical judgement-Pt s/p phenol/ botox can benefit from HEP review and updates PRN as well as  education regarding positioning to minimize contracture (for caregiver)   Functional Limitation Other OT primary   Other OT Primary Current Status [id number]) At least 40 percent but less than 60 percent impaired, limited or restricted   Other OT Primary Goal Status [id number]) At least 20 percent but less than 40 percent impaired, limited or restricted      Problem List Patient Active Problem List   Diagnosis Date Noted  . Spastic hemiplegia affecting right dominant side ([place]) 05/31/2015  . Sepsis ([place]) 10/08/2014  . Acute encephalopathy 10/08/2014  . UTI (lower urinary tract infection) 10/08/2014  . Elevated  troponin 10/08/2014  . Right hemiparesis ([place]) 07/05/2014  . Dysphagia, post-stroke 07/05/2014  . Dysarthria due to recent cerebral infarction 07/05/2014  . Left pontine stroke ([place]) 07/02/2014  . Embolic stroke involving left carotid artery ([place]) 06/28/2014  . Stroke ([place]) 06/28/2014  . CAP (community acquired pneumonia) 06/28/2014  . Normocytic anemia 06/28/2014  . Essential hypertension 06/28/2014  . Hypokalemia 06/28/2014    Judge Duque 07/09/2015, 4:09 PM Theone Murdoch, OTR/L [phone] Phone: [phone] 4:09 PM 07/09/2015 [facility] [address] [place], [place], [address] Phone: [phone]   Fax:  [phone]  Name: [name] MRN: [id number] Date of Birth: March 24, 1926

## 2015-07-11 ENCOUNTER — Ambulatory Visit: Payer: Medicare Other

## 2015-07-11 DIAGNOSIS — R269 Unspecified abnormalities of gait and mobility: Secondary | ICD-10-CM

## 2015-07-11 DIAGNOSIS — R2681 Unsteadiness on feet: Secondary | ICD-10-CM | POA: Diagnosis not present

## 2015-07-11 DIAGNOSIS — M25511 Pain in right shoulder: Secondary | ICD-10-CM | POA: Diagnosis not present

## 2015-07-11 DIAGNOSIS — M79641 Pain in right hand: Secondary | ICD-10-CM | POA: Diagnosis not present

## 2015-07-11 DIAGNOSIS — M6289 Other specified disorders of muscle: Secondary | ICD-10-CM | POA: Diagnosis not present

## 2015-07-11 DIAGNOSIS — G8110 Spastic hemiplegia affecting unspecified side: Secondary | ICD-10-CM | POA: Diagnosis not present

## 2015-07-11 NOTE — Patient Instructions (Signed)
  Please perform stretches only before therapy sessions, please do not perform strengthening (home exercises or bike) or endurance training prior to therapy as this fatigues patient.

## 2015-07-11 NOTE — Therapy (Signed)
[facility] [address] [place], [place], [address] Phone: [phone]   Fax:  [phone]  Physical Therapy Treatment  Patient Details  Name: [name] MRN: [id number] Date of Birth: 10-25-1925 No Data Recorded  Encounter Date: 07/11/2015      PT End of Session - 07/11/15 1153    Visit Number 22   Number of Visits 25   Date for PT Re-Evaluation 07/25/15   Authorization Type G-code every 10th visit   PT Start Time 1101   PT Stop Time 1141   PT Time Calculation (min) 40 min   Equipment Utilized During Treatment Gait belt   Activity Tolerance Patient limited by fatigue   Behavior During Therapy --  pt appearted to be lethargic at times      Past Medical History  Diagnosis Date  . Cancer ([place])   . Stroke ([place])   . Hearing loss     Past Surgical History  Procedure Laterality Date  . Appendectomy    . Kidney surgery      There were no vitals filed for this visit.  Visit Diagnosis:  Abnormality of gait  Unsteadiness      Subjective Assessment - 07/11/15 1107    Subjective Pt denied falls or changes since last visit. Pt reported he has more energy this week vs. last week.    Patient is accompained by: --  caregiver-Lor[place]   Pertinent History HTN, hx of CA,    Patient Stated Goals I want to get back to normal.    Currently in Pain? No/denies                         [facility] Adult PT Treatment/Exercise - 07/11/15 1108    Transfers   Transfers Sit to Stand;Stand to Sit   Sit to Stand 3: Mod assist;2: Max assist   Sit to Stand Details Tactile cues for sequencing;Tactile cues for weight shifting;Tactile cues for posture;Tactile cues for placement;Manual facilitation for weight shifting;Verbal cues for technique   Sit to Stand Details (indicate cue type and reason) x5 reps. Cues to improve ant. wt. shifting and for technique. Pt progressed from max A to mod A with cues.   Stand to  Sit 4: Min assist   Stand to Sit Details (indicate cue type and reason) Verbal cues for precautions/safety   Stand to Sit Details x5 reps. Cues to improve eccentric control and to wait to seat in order for w/c to be placed behind pt.   Ambulation/Gait   Ambulation/Gait Yes   Ambulation/Gait Assistance 3: Mod assist;2: Max assist   Ambulation/Gait Assistance Details Pt required increased cues today to improve upright posture, ant. wt. shifting, and sequencing/placement during gait. Pt required seated rest breaks 2/2 fatigue in between gait trials.    Ambulation Distance (Feet) --  5', 7'x2, 10'   Assistive device Hemi-walker   Gait Pattern Step-to pattern;Decreased dorsiflexion - right;Right flexed knee in stance;Narrow base of support;Trunk flexed;Decreased arm swing - right;Decreased step length - left;Decreased stance time - right;Decreased weight shift to right;Step-through pattern;Left flexed knee in stance   Ambulation Surface Level;Indoor                PT Education - 07/11/15 1151    Education provided Yes   Education Details PT discussed the importance of performing stretches only prior to therapy and not using bike prior to therapy, as this caused fatigue for pt during session. PT  also discussed likelihood of d/c at end of POC, if pt is no longer making functional progress.  PT discused taking R knee ext brace to vendor for repair, as pt's caregiver reported it broke last night.    Person(s) Educated Patient;Caregiver(s)   Methods Explanation   Comprehension Verbalized understanding          PT Short Term Goals - 04/30/15 1335    PT SHORT TERM GOAL #1   Title Pt will be ind. in performing with caregiver assist in order to improve strength, flexibility, and balance. Target date: 04/23/2015   Status Partially Met   PT SHORT TERM GOAL #2   Title Perform BERG, TUG, and gait speed when pt able to stand/amb. for longer durations (if appropriate). Target date: 04/23/15    Status Partially Met   PT SHORT TERM GOAL #3   Title Pt will stand with no UE and min guard for 1 minute in order to perform ADLs. Target date: 04/23/15.   Status Partially Met   PT SHORT TERM GOAL #4   Title Pt will ambulate 38' with LRAD and min A to improve functional mobiltiy. Target date: 04/23/15.   Status Partially Met           PT Long Term Goals - 06/25/15 1210    PT LONG TERM GOAL #1   Title Pt will verbalize understanding of CVA risk factors/sign/symptoms to decrease risk of another CVA. Target date: 05/21/15.   Baseline All on-going goals will be carried over to new POC: 07/23/15.   Status Achieved   PT LONG TERM GOAL #2   Title Pt will ambulate 30' with min guard and LRAD to improve functional mobility. Target date: 07/23/15.   Status On-going   PT LONG TERM GOAL #3   Title Pt will reach 5" outside BOS with supervision, while standing with no UE support, to perform ADLs. Target date: 07/23/15.   Status On-going   PT LONG TERM GOAL #4   Title Pt will propel manual w/c 150' with L UE/L LE with supervision to improve functional mobility. Target date: 07/23/15.   Status On-going               Plan - 07/11/15 1200    Clinical Impression Statement Pt required more assistance today and longer periods of seated rest breaks, after amb. shorter distances 2/2 fatigue. Fatigue most likely due to pt using foot pedal bike at home for 10 minutes, prior to PT session. PT educated pt's caregiver on the importance of performing stretches only prior to PT. Continue with POC.   Pt will benefit from skilled therapeutic intervention in order to improve on the following deficits Abnormal gait;Decreased endurance;Decreased activity tolerance;Decreased balance;Decreased knowledge of use of DME;Decreased strength;Impaired UE functional use;Decreased cognition;Decreased coordination;Decreased safety awareness;Impaired flexibility;Postural dysfunction;Decreased range of motion;Decreased  mobility   Rehab Potential Fair   Clinical Impairments Affecting Rehab Potential impaired cognition and B knee contractures   PT Frequency 2x / week   PT Duration 4 weeks   PT Treatment/Interventions ADLs/Self Care Home Management;Neuromuscular re-education;Biofeedback;Electrical Stimulation;Therapeutic activities;Therapeutic exercise;Balance training;Manual techniques;Vestibular;Wheelchair mobility training;Orthotic Fit/Training;Functional mobility training;Patient/family education;Gait training;Stair training;DME Instruction   PT Next Visit Plan Continue gait with hemi walker, w/c propulsion with BLE's.   PT Home Exercise Plan Stretch, strengthening, and balance HEP   Consulted and Agree with Plan of Care Family member/caregiver;Patient   Family Member Consulted caregiver, [name]        Problem List Patient Active Problem List   Diagnosis  Date Noted  . Spastic hemiplegia affecting right dominant side ([place]) 05/31/2015  . Sepsis ([place]) 10/08/2014  . Acute encephalopathy 10/08/2014  . UTI (lower urinary tract infection) 10/08/2014  . Elevated troponin 10/08/2014  . Right hemiparesis ([place]) 07/05/2014  . Dysphagia, post-stroke 07/05/2014  . Dysarthria due to recent cerebral infarction 07/05/2014  . Left pontine stroke ([place]) 07/02/2014  . Embolic stroke involving left carotid artery ([place]) 06/28/2014  . Stroke ([place]) 06/28/2014  . CAP (community acquired pneumonia) 06/28/2014  . Normocytic anemia 06/28/2014  . Essential hypertension 06/28/2014  . Hypokalemia 06/28/2014    Aneth Schlagel L 07/11/2015, 12:03 PM  [facility] [address] [place], [place], [address] Phone: [phone]   Fax:  [phone]  Name: CAILEB RHUE MRN: [id number] Date of Birth: 1925/11/29     Geoffry Paradise, PT,DPT 07/11/2015 12:03 PM Phone: [phone] Fax: [phone]

## 2015-07-12 ENCOUNTER — Telehealth: Payer: Self-pay | Admitting: *Deleted

## 2015-07-12 NOTE — Telephone Encounter (Signed)
Miss Dieleman called on behalf of patient reporting that his external knee brace has broken. Not knowing where this brace came from she contacted the [facility] to find out how to get this device repaired.  She was told to contact the vendor and decided that it must be [place].   I contacted pt's wife and tried to get more information about the device and how it came into their possession.  After making a few phone calls it was determined that [employer] on [address] provided the device. During my conversation with the [employer] representative, they said that they would call the patient directly and set up an appointment.

## 2015-07-16 ENCOUNTER — Ambulatory Visit: Payer: Medicare Other | Admitting: Occupational Therapy

## 2015-07-16 ENCOUNTER — Emergency Department (HOSPITAL_COMMUNITY): Payer: Medicare Other

## 2015-07-16 ENCOUNTER — Inpatient Hospital Stay (HOSPITAL_COMMUNITY)
Admission: EM | Admit: 2015-07-16 | Discharge: 2015-07-20 | DRG: 871 | Disposition: A | Discharge status: Home / Self Care | Payer: Medicare Other | Attending: Internal Medicine | Admitting: Internal Medicine

## 2015-07-16 ENCOUNTER — Encounter (HOSPITAL_COMMUNITY): Payer: Self-pay

## 2015-07-16 ENCOUNTER — Ambulatory Visit: Payer: Medicare Other

## 2015-07-16 DIAGNOSIS — I69391 Dysphagia following cerebral infarction: Secondary | ICD-10-CM

## 2015-07-16 DIAGNOSIS — R1310 Dysphagia, unspecified: Secondary | ICD-10-CM | POA: Diagnosis present

## 2015-07-16 DIAGNOSIS — R402441 Other coma, without documented Glasgow coma scale score, or with partial score reported, in the field [EMT or ambulance]: Secondary | ICD-10-CM | POA: Diagnosis not present

## 2015-07-16 DIAGNOSIS — E785 Hyperlipidemia, unspecified: Secondary | ICD-10-CM | POA: Diagnosis present

## 2015-07-16 DIAGNOSIS — J189 Pneumonia, unspecified organism: Secondary | ICD-10-CM | POA: Diagnosis not present

## 2015-07-16 DIAGNOSIS — Z87440 Personal history of urinary (tract) infections: Secondary | ICD-10-CM

## 2015-07-16 DIAGNOSIS — Z7902 Long term (current) use of antithrombotics/antiplatelets: Secondary | ICD-10-CM | POA: Diagnosis not present

## 2015-07-16 DIAGNOSIS — R55 Syncope and collapse: Secondary | ICD-10-CM | POA: Diagnosis not present

## 2015-07-16 DIAGNOSIS — Z87891 Personal history of nicotine dependence: Secondary | ICD-10-CM

## 2015-07-16 DIAGNOSIS — I10 Essential (primary) hypertension: Secondary | ICD-10-CM | POA: Diagnosis present

## 2015-07-16 DIAGNOSIS — Z993 Dependence on wheelchair: Secondary | ICD-10-CM | POA: Diagnosis not present

## 2015-07-16 DIAGNOSIS — H9190 Unspecified hearing loss, unspecified ear: Secondary | ICD-10-CM | POA: Diagnosis present

## 2015-07-16 DIAGNOSIS — I63132 Cerebral infarction due to embolism of left carotid artery: Secondary | ICD-10-CM | POA: Diagnosis not present

## 2015-07-16 DIAGNOSIS — A419 Sepsis, unspecified organism: Secondary | ICD-10-CM | POA: Diagnosis present

## 2015-07-16 DIAGNOSIS — F0150 Vascular dementia without behavioral disturbance: Secondary | ICD-10-CM | POA: Diagnosis present

## 2015-07-16 DIAGNOSIS — N390 Urinary tract infection, site not specified: Secondary | ICD-10-CM | POA: Diagnosis present

## 2015-07-16 DIAGNOSIS — Z79899 Other long term (current) drug therapy: Secondary | ICD-10-CM | POA: Diagnosis not present

## 2015-07-16 DIAGNOSIS — Z8249 Family history of ischemic heart disease and other diseases of the circulatory system: Secondary | ICD-10-CM

## 2015-07-16 DIAGNOSIS — J690 Pneumonitis due to inhalation of food and vomit: Secondary | ICD-10-CM | POA: Diagnosis present

## 2015-07-16 DIAGNOSIS — R4182 Altered mental status, unspecified: Secondary | ICD-10-CM | POA: Diagnosis present

## 2015-07-16 DIAGNOSIS — I69351 Hemiplegia and hemiparesis following cerebral infarction affecting right dominant side: Secondary | ICD-10-CM | POA: Diagnosis not present

## 2015-07-16 DIAGNOSIS — G8191 Hemiplegia, unspecified affecting right dominant side: Secondary | ICD-10-CM | POA: Diagnosis not present

## 2015-07-16 DIAGNOSIS — R531 Weakness: Secondary | ICD-10-CM | POA: Diagnosis not present

## 2015-07-16 DIAGNOSIS — R918 Other nonspecific abnormal finding of lung field: Secondary | ICD-10-CM | POA: Diagnosis not present

## 2015-07-16 LAB — URINE MICROSCOPIC-ADD ON

## 2015-07-16 LAB — COMPREHENSIVE METABOLIC PANEL
ALK PHOS: 82 U/L (ref 38–126)
ALT: 27 U/L (ref 17–63)
AST: 20 U/L (ref 15–41)
Albumin: 4 g/dL (ref 3.5–5.0)
Anion gap: 7 (ref 5–15)
BUN: 27 mg/dL — AB (ref 6–20)
CO2: 26 mmol/L (ref 22–32)
CREATININE: 0.89 mg/dL (ref 0.61–1.24)
Calcium: 9.7 mg/dL (ref 8.9–10.3)
Chloride: 102 mmol/L (ref 101–111)
GFR calc Af Amer: >60 mL/min (ref >60)
Glucose, Bld: 108 mg/dL — ABNORMAL HIGH (ref 65–99)
Potassium: 4.5 mmol/L (ref 3.5–5.1)
Sodium: 135 mmol/L (ref 135–145)
TOTAL PROTEIN: 7.1 g/dL (ref 6.5–8.1)
Total Bilirubin: 0.8 mg/dL (ref 0.3–1.2)

## 2015-07-16 LAB — CBC WITH DIFFERENTIAL/PLATELET
Basophils Absolute: 0 10*3/uL (ref 0.0–0.1)
Basophils Relative: 0 %
Eosinophils Absolute: 0.1 10*3/uL (ref 0.0–0.7)
Eosinophils Relative: 1 %
HEMATOCRIT: 41.2 % (ref 39.0–52.0)
HEMOGLOBIN: 13.4 g/dL (ref 13.0–17.0)
LYMPHS ABS: 1 10*3/uL (ref 0.7–4.0)
LYMPHS PCT: 9 %
MCH: 29.2 pg (ref 26.0–34.0)
MCHC: 32.5 g/dL (ref 30.0–36.0)
MCV: 89.8 fL (ref 78.0–100.0)
MONO ABS: 0.9 10*3/uL (ref 0.1–1.0)
MONOS PCT: 8 %
NEUTROS ABS: 8.9 10*3/uL — AB (ref 1.7–7.7)
NEUTROS PCT: 82 %
Platelets: 346 10*3/uL (ref 150–400)
RBC: 4.59 MIL/uL (ref 4.22–5.81)
RDW: 13.2 % (ref 11.5–15.5)
WBC: 10.9 10*3/uL — ABNORMAL HIGH (ref 4.0–10.5)

## 2015-07-16 LAB — URINALYSIS, ROUTINE W REFLEX MICROSCOPIC
BILIRUBIN URINE: NEGATIVE
Glucose, UA: NEGATIVE mg/dL
Hgb urine dipstick: NEGATIVE
KETONES UR: NEGATIVE mg/dL
NITRITE: NEGATIVE
Protein, ur: NEGATIVE mg/dL
Specific Gravity, Urine: 1.017 (ref 1.005–1.030)
pH: 7.5 (ref 5.0–8.0)

## 2015-07-16 LAB — TROPONIN I: Troponin I: <0.03 ng/mL (ref <0.031)

## 2015-07-16 LAB — I-STAT CG4 LACTIC ACID, ED
LACTIC ACID, VENOUS: 1.18 mmol/L (ref 0.5–2.0)
LACTIC ACID, VENOUS: 0.71 mmol/L (ref 0.5–2.0)

## 2015-07-16 LAB — I-STAT TROPONIN, ED: TROPONIN I, POC: 0 ng/mL (ref 0.00–0.08)

## 2015-07-16 MED ORDER — HYDROCODONE-ACETAMINOPHEN 5-325 MG PO TABS: 1.0000 | ORAL_TABLET | ORAL | Status: DC | PRN

## 2015-07-16 MED ORDER — SODIUM CHLORIDE 0.9 % IJ SOLN
3.0000 mL | Freq: Two times a day (BID) | INTRAMUSCULAR | Status: DC
  Administered 2015-07-16 – 2015-07-19 (×2): 3 mL via INTRAVENOUS

## 2015-07-16 MED ORDER — DEXTROSE 5 % IV SOLN
1.0000 g | Freq: Once | INTRAVENOUS | Status: AC
  Administered 2015-07-16: 1 g via INTRAVENOUS
  Filled 2015-07-16: qty 10

## 2015-07-16 MED ORDER — SODIUM CHLORIDE 0.9 % IV SOLN
INTRAVENOUS | Status: AC
  Administered 2015-07-16 – 2015-07-17 (×2): via INTRAVENOUS

## 2015-07-16 MED ORDER — TAMSULOSIN HCL 0.4 MG PO CAPS
0.4000 mg | ORAL_CAPSULE | Freq: Every day | ORAL | Status: DC
  Administered 2015-07-16 – 2015-07-19 (×3): 0.4 mg via ORAL
  Filled 2015-07-16 (×5): qty 1

## 2015-07-16 MED ORDER — CLOPIDOGREL BISULFATE 75 MG PO TABS
75.0000 mg | ORAL_TABLET | Freq: Every day | ORAL | Status: DC
  Administered 2015-07-16 – 2015-07-20 (×5): 75 mg via ORAL
  Filled 2015-07-16 (×5): qty 1

## 2015-07-16 MED ORDER — BUPROPION HCL 100 MG PO TABS
100.0000 mg | ORAL_TABLET | Freq: Every morning | ORAL | Status: DC
  Administered 2015-07-16 – 2015-07-20 (×5): 100 mg via ORAL
  Filled 2015-07-16 (×5): qty 1

## 2015-07-16 MED ORDER — ONDANSETRON HCL 4 MG PO TABS: 4.0000 mg | ORAL_TABLET | Freq: Four times a day (QID) | ORAL | Status: DC | PRN

## 2015-07-16 MED ORDER — ATORVASTATIN CALCIUM 20 MG PO TABS
20.0000 mg | ORAL_TABLET | Freq: Every day | ORAL | Status: DC
  Administered 2015-07-16 – 2015-07-19 (×4): 20 mg via ORAL
  Filled 2015-07-16 (×5): qty 1

## 2015-07-16 MED ORDER — GUAIFENESIN-DM 100-10 MG/5ML PO SYRP: 5.0000 mL | ORAL_SOLUTION | ORAL | Status: DC | PRN

## 2015-07-16 MED ORDER — DEXTROSE 5 % IV SOLN
500.0000 mg | Freq: Once | INTRAVENOUS | Status: DC
  Administered 2015-07-16: 500 mg via INTRAVENOUS
  Filled 2015-07-16: qty 500

## 2015-07-16 MED ORDER — SODIUM CHLORIDE 0.9 % IV BOLUS (SEPSIS)
500.0000 mL | Freq: Once | INTRAVENOUS | Status: AC
  Administered 2015-07-16: 500 mL via INTRAVENOUS

## 2015-07-16 MED ORDER — ONDANSETRON HCL 4 MG/2ML IJ SOLN: 4.0000 mg | Freq: Four times a day (QID) | INTRAMUSCULAR | Status: DC | PRN

## 2015-07-16 MED ORDER — SODIUM CHLORIDE 0.9 % IV SOLN: INTRAVENOUS | Status: DC

## 2015-07-16 MED ORDER — LORAZEPAM 2 MG/ML IJ SOLN
0.5000 mg | Freq: Once | INTRAMUSCULAR | Status: AC
  Administered 2015-07-16: 0.5 mg via INTRAVENOUS
  Filled 2015-07-16: qty 1

## 2015-07-16 MED ORDER — SODIUM CHLORIDE 0.9 % IV BOLUS (SEPSIS): 1000.0000 mL | INTRAVENOUS | Status: AC

## 2015-07-16 MED ORDER — DEXTROSE 5 % IV SOLN
500.0000 mg | INTRAVENOUS | Status: DC
  Administered 2015-07-17 – 2015-07-19 (×3): 500 mg via INTRAVENOUS
  Filled 2015-07-16 (×3): qty 500

## 2015-07-16 MED ORDER — SODIUM CHLORIDE 0.9 % IV SOLN
3.0000 g | Freq: Four times a day (QID) | INTRAVENOUS | Status: DC
  Administered 2015-07-16 – 2015-07-19 (×12): 3 g via INTRAVENOUS
  Filled 2015-07-16 (×15): qty 3

## 2015-07-16 MED ORDER — METOPROLOL TARTRATE 1 MG/ML IV SOLN: 5.0000 mg | INTRAVENOUS | Status: DC | PRN

## 2015-07-16 MED ORDER — MORPHINE SULFATE (PF) 2 MG/ML IV SOLN: 2.0000 mg | INTRAVENOUS | Status: DC | PRN

## 2015-07-16 MED ORDER — ALBUTEROL SULFATE (2.5 MG/3ML) 0.083% IN NEBU
2.5000 mg | INHALATION_SOLUTION | RESPIRATORY_TRACT | Status: DC | PRN
Start: 2015-07-16 — End: 2015-07-20

## 2015-07-16 MED ORDER — POLYETHYLENE GLYCOL 3350 17 G PO PACK
17.0000 g | PACK | Freq: Every day | ORAL | Status: DC
  Administered 2015-07-16 – 2015-07-19 (×4): 17 g via ORAL
  Filled 2015-07-16 (×5): qty 1

## 2015-07-16 MED ORDER — ENOXAPARIN SODIUM 40 MG/0.4ML ~~LOC~~ SOLN
40.0000 mg | SUBCUTANEOUS | Status: DC
  Administered 2015-07-16 – 2015-07-19 (×4): 40 mg via SUBCUTANEOUS
  Filled 2015-07-16 (×5): qty 0.4

## 2015-07-16 MED ORDER — RESOURCE THICKENUP CLEAR PO POWD
ORAL | Status: DC | PRN
  Filled 2015-07-16: qty 125

## 2015-07-16 NOTE — ED Notes (Signed)
IV TEAM PRESENT 

## 2015-07-16 NOTE — ED Provider Notes (Signed)
CSN: [id number]     Arrival date & time 07/16/15  [id number] History   First MD Initiated Contact with Patient 07/16/15 [phone]     Chief Complaint  Patient presents with  . Weakness  . Altered Mental Status  . urinary problems      (Consider location/radiation/quality/duration/timing/severity/associated sxs/prior Treatment) The history is provided by the patient and the spouse.  [name] is a 81 y.o. male hx of stroke with R sided contractures, recurrent UTI, pneumonia here with weakness. Patient was noted to be weak for the last month or so. Patient resides at home with his wife and an aide. Patient has been weaker over the last week she may have some dark urine. So primary care doctor put urinalysis was not ordered yet. Patient was too weak to stand up today so was brought to the ER. Denies any falls. At baseline, patient requires assistance with walking. Also per the wife, patient may have been more confused than usual. Had a stroke a year ago, on plavix. Was admitted 6 months ago for UTI with similar symptoms.     Level V caveat- condition of patient, dementia    Past Medical History  Diagnosis Date  . Cancer ([place])   . Stroke ([place])   . Hearing loss    Past Surgical History  Procedure Laterality Date  . Appendectomy    . Kidney surgery     Family History  Problem Relation Age of Onset  . CAD Father    Social History  Substance Use Topics  . Smoking status: Former Research scientist (life sciences)  . Smokeless tobacco: None  . Alcohol Use: No     Comment: Occasionally    Review of Systems  Unable to perform ROS: Mental status change  Neurological: Positive for weakness.  All other systems reviewed and are negative.     Allergies  Review of patient's allergies indicates no known allergies.  Home Medications   Prior to Admission medications   Medication Sig Start Date End Date Taking? Authorizing Provider  atorvastatin (LIPITOR) 20 MG tablet Take 1 tablet (20 mg total) by mouth daily at  6 PM. 07/30/14  Yes Ivan Anchors Love, PA-C  buPROPion (WELLBUTRIN) 100 MG tablet Take 100 mg by mouth every morning. Pt not sure of dosage   Yes Historical Provider, MD  clopidogrel (PLAVIX) 75 MG tablet Take 1 tablet (75 mg total) by mouth daily. 10/03/14  Yes Britt Bottom, MD  nitrofurantoin (MACRODANTIN) 50 MG capsule Take 50 mg by mouth at bedtime.   Yes Historical Provider, MD  tamsulosin (FLOMAX) 0.4 MG CAPS capsule Take 0.4 mg by mouth at bedtime.    Yes Historical Provider, MD  FLUAD 0.5 ML SUSY 06/14/15 06/14/15   Historical Provider, MD  tiZANidine (ZANAFLEX) 4 MG tablet Take 1 tablet (4 mg total) by mouth 3 (three) times daily. Patient not taking: Reported on 07/16/2015 06/28/15   Charlett Blake, MD   BP 140/69 mmHg  Pulse 69  Temp(Src) 96.4 F (35.8 C) (Rectal)  Resp 18  SpO2 100% Physical Exam  Constitutional:  Confused, chronically ill   HENT:  Head: Normocephalic.  Mouth/Throat: Oropharynx is clear and moist.  Eyes: Conjunctivae are normal. Pupils are equal, round, and reactive to light.  Neck: Normal range of motion. Neck supple.  Cardiovascular: Normal rate, regular rhythm and normal heart sounds.   Pulmonary/Chest: Effort normal and breath sounds normal. No respiratory distress. He has no wheezes. He has no rales.  Abdominal: Soft. Bowel  sounds are normal. He exhibits no distension. There is no tenderness. There is no rebound.  Musculoskeletal: Normal range of motion. He exhibits no edema or tenderness.  Neurological:  A &O x 2. R arm and leg contractures (chronic), nl strength L side.   Skin: Skin is warm and dry.  Psychiatric:  unable  Nursing note and vitals reviewed.   ED Course  Procedures (including critical care time) Labs Review Labs Reviewed  CBC WITH DIFFERENTIAL/PLATELET - Abnormal; Notable for the following:    WBC 10.9 (*)    Neutro Abs 8.9 (*)    All other components within normal limits  COMPREHENSIVE METABOLIC PANEL - Abnormal; Notable for  the following:    Glucose, Bld 108 (*)    BUN 27 (*)    All other components within normal limits  URINALYSIS, ROUTINE W REFLEX MICROSCOPIC (NOT AT [facility]) - Abnormal; Notable for the following:    APPearance CLOUDY (*)    Leukocytes, UA LARGE (*)    All other components within normal limits  URINE MICROSCOPIC-ADD ON - Abnormal; Notable for the following:    Squamous Epithelial / LPF 0-5 (*)    Bacteria, UA MANY (*)    All other components within normal limits  URINE CULTURE  CULTURE, BLOOD (ROUTINE X 2)  CULTURE, BLOOD (ROUTINE X 2)  I-STAT TROPOININ, ED  I-STAT CG4 LACTIC ACID, ED    Imaging Review Dg Chest 1 View  07/16/2015  CLINICAL DATA:  Patient with increased altered mental status and weakness. EXAM: CHEST 1 VIEW COMPARISON:  Chest radiograph 12/12/2014. FINDINGS: Multiple monitoring leads overlie the patient. Patient is rotated to the right. Stable cardiac and mediastinal contours. Heterogeneous right-greater-than-left basilar interstitial opacities. Biapical pleural parenchymal thickening and scarring. No large pleural effusion or pneumothorax. IMPRESSION: Heterogeneous right-greater-than-left opacities which may represent an acute infectious process in the appropriate clinical setting. Electronically Signed   By: Lovey Newcomer M.D.   On: 07/16/2015 09:40   I have personally reviewed and evaluated these images and lab results as part of my medical decision-making.   EKG Interpretation   Date/Time:  Tuesday July 16 2015 08:56:50 EST Ventricular Rate:  72 PR Interval:  49 QRS Duration: 126 QT Interval:  479 QTC Calculation: 524 R Axis:   25 Text Interpretation:  Sinus rhythm Atrial premature complex Short PR  interval IVCD, consider atypical RBBB Anteroseptal infarct, age  indeterminate ST elevation, consider inferior injury Baseline wander in  lead(s) I III aVL poor baseline, grossly unchangeed  Confirmed by Daivion Pape  MD,  TRUE Garciamartinez ([id number]) on 07/16/2015 9:05:36 AM       MDM   Final diagnoses:  None   [name] is a 81 y.o. male here with weakness. Consider infection vs electrolyte abnormality vs stroke. Will get labs, CT head, UA, CXR.   11:24 AM CXR showed pneumonia. Has UTI as well. Given ceftriaxone, azithro. Patient hypothermic as well, placed on warm blankets. Will admit for sepsis from UTI, pneumonia.      Wandra Arthurs, MD 07/16/15 [phone]

## 2015-07-16 NOTE — ED Notes (Signed)
Attempt with iv not successful- lab at bs to attempt labs

## 2015-07-16 NOTE — ED Notes (Signed)
MD at bedside. 

## 2015-07-16 NOTE — ED Notes (Signed)
ADMISSION RN KIM PRESENT SPEAKING WITH PT

## 2015-07-16 NOTE — Progress Notes (Addendum)
CM noted pt is using [facility] per admission RN note This is a PDN/PCS provider. [facility] CM unable to setup services for PDN/PCS Pt is aware to contact this agency for further assist at d/c [facility] staff in room with pt and wife  Refused information on home health services

## 2015-07-16 NOTE — ED Notes (Signed)
MADE AWARE OF 96.4 RECTAL TEMPERATURE. PT STILL OK TO TRANSFER TO 5TH FLOOR.Marland Kitchen PLEASE CONTINUE WITH WARMING BLANKETS, CLOSE DOOR TO PT'S ROOM AND INCREASE ROOM TEMPERATURE TO 80 DEGREES.

## 2015-07-16 NOTE — ED Notes (Signed)
EDP PRESENT TO SPEAK WITH FAMILY AND CARE GIVER

## 2015-07-16 NOTE — H&P (Signed)
Patient Demographics:    [name], is a 81 y.o. male  MRN: [id number]   DOB - October 22, 1925  Admit Date - 07/16/2015  Outpatient Primary MD for the patient is Simona Huh, MD   With History of -  Past Medical History  Diagnosis Date  . Cancer ([place])   . Stroke ([place])   . Hearing loss       Past Surgical History  Procedure Laterality Date  . Appendectomy    . Kidney surgery      in for   Chief Complaint  Patient presents with  . Weakness  . Altered Mental Status  . urinary problems       HPI:    [name]  is a 80 y.o. male, history of CVA with right-sided hemiparesis currently bed and wheelchair bound, hearing loss, dyslipidemia, UTIs who lives at his home with his wife and is firmly against seen PT-OT or speech therapy was brought to the ER when this morning he stood up with the help of a caregiver but then slumped to the floor, he did not have any headache, no fever chills, no chest abdominal pain, no dysuria, no diarrhea, no cough or shortness of breath, no new weakness. According to the wife they have been told that if he falls to call 911, they did call 911 and he was brought to the ER.  The ER workup suggestive of possible community-acquired pneumonia, also found to have UTI, patient has baseline temperature on a normal day of about 97, he was found to have a temperature of 96.2. I was called to admit the patient for pneumonia. He is relatively symptom-free and currently feels close to his baseline.    Review of systems:    In addition to the HPI above, currently has no complaints No Fever-chills, No Headache, No changes with Vision or hearing, No problems swallowing food or Liquids, No Chest pain, Cough or Shortness of Breath, No Abdominal pain, No Nausea or Vommitting, Bowel  movements are regular, No Blood in stool or Urine, No dysuria, No new skin rashes or bruises, No new joints pains-aches,  No new weakness, tingling, numbness in any extremity, No recent weight gain or loss, No polyuria, polydypsia or polyphagia, No significant Mental Stressors.  A full 10 point Review of Systems was done, except as stated above, all other Review of Systems were negative.    Social History:     Social History  Substance Use Topics  . Smoking status: Former Research scientist (life sciences)  . Smokeless tobacco: Not on file  . Alcohol Use: No     Comment: Occasionally    Lives - at home with his wife, has a caregiver at home, mostly bed and wheelchair bound      Family History :     Family History  Problem Relation Age of Onset  . CAD Father        Home Medications:  Prior to Admission medications   Medication Sig Start Date End Date Taking? Authorizing Provider  atorvastatin (LIPITOR) 20 MG tablet Take 1 tablet (20 mg total) by mouth daily at 6 PM. 07/30/14  Yes Ivan Anchors Love, PA-C  buPROPion (WELLBUTRIN) 100 MG tablet Take 100 mg by mouth every morning. Pt not sure of dosage   Yes Historical Provider, MD  clopidogrel (PLAVIX) 75 MG tablet Take 1 tablet (75 mg total) by mouth daily. 10/03/14  Yes Britt Bottom, MD  nitrofurantoin (MACRODANTIN) 50 MG capsule Take 50 mg by mouth at bedtime.   Yes Historical Provider, MD  tamsulosin (FLOMAX) 0.4 MG CAPS capsule Take 0.4 mg by mouth at bedtime.    Yes Historical Provider, MD  FLUAD 0.5 ML SUSY 06/14/15 06/14/15   Historical Provider, MD  tiZANidine (ZANAFLEX) 4 MG tablet Take 1 tablet (4 mg total) by mouth 3 (three) times daily. Patient not taking: Reported on 07/16/2015 06/28/15   Charlett Blake, MD     Allergies:    No Known Allergies   Physical Exam:   Vitals  Blood pressure 140/69, pulse 69, temperature 96.4 F (35.8 C), temperature source Rectal, resp. rate 18, SpO2 100 %.   1. General elderly frail white male  lying in hospital bed in no apparent distress,  2. Normal affect and insight, Not Suicidal or Homicidal, Awake Alert, Oriented X 3.  3. No F.N deficits, ALL C.Nerves Intact, strength is 5 over 5 in left-sided extremity's, right hand has chronic contracture with strength 3 over 5, right leg strength 3 over 5, Plantars down going.  4. Ears and Eyes appear Normal, Conjunctivae clear, PERRLA. Moist Oral Mucosa.  5. Supple Neck, No JVD, No cervical lymphadenopathy appriciated, No Carotid Bruits.  6. Symmetrical Chest wall movement, Good air movement bilaterally, few bibasilar crackles  7. RRR, No Gallops, Rubs or Murmurs, No Parasternal Heave.  8. Positive Bowel Sounds, Abdomen Soft, No tenderness, No organomegaly appriciated,No rebound -guarding or rigidity.  9.  No Cyanosis, Normal Skin Turgor, No Skin Rash or Bruise.  10. Good muscle tone,  joints appear normal , no effusions, Normal ROM.  11. No Palpable Lymph Nodes in Neck or Axillae      Data Review:    CBC  Recent Labs Lab 07/16/15 0929  WBC 10.9*  HGB 13.4  HCT 41.2  PLT 346  MCV 89.8  MCH 29.2  MCHC 32.5  RDW 13.2  LYMPHSABS 1.0  MONOABS 0.9  EOSABS 0.1  BASOSABS 0.0   ------------------------------------------------------------------------------------------------------------------  Chemistries   Recent Labs Lab 07/16/15 0929  NA 135  K 4.5  CL 102  CO2 26  GLUCOSE 108*  BUN 27*  CREATININE 0.89  CALCIUM 9.7  AST 20  ALT 27  ALKPHOS 82  BILITOT 0.8   ------------------------------------------------------------------------------------------------------------------ CrCl cannot be calculated (Unknown ideal weight.). ------------------------------------------------------------------------------------------------------------------ No results for input(s): TSH, T4TOTAL, T3FREE, THYROIDAB in the last 72 hours.  Invalid input(s): FREET3   Coagulation profile No results for input(s): INR, PROTIME in  the last 168 hours. ------------------------------------------------------------------------------------------------------------------- No results for input(s): DDIMER in the last 72 hours. -------------------------------------------------------------------------------------------------------------------  Cardiac Enzymes No results for input(s): CKMB, TROPONINI, MYOGLOBIN in the last 168 hours.  Invalid input(s): CK ------------------------------------------------------------------------------------------------------------------ Invalid input(s): POCBNP   ---------------------------------------------------------------------------------------------------------------  Urinalysis    Component Value Date/Time   COLORURINE YELLOW 07/16/2015 1028   APPEARANCEUR CLOUDY* 07/16/2015 1028   LABSPEC 1.017 07/16/2015 1028   PHURINE 7.5 07/16/2015 1028   GLUCOSEU NEGATIVE 07/16/2015 1028  HGBUR NEGATIVE 07/16/2015 1028   [place] 07/16/2015 1028   KETONESUR NEGATIVE 07/16/2015 1028   PROTEINUR NEGATIVE 07/16/2015 1028   UROBILINOGEN 0.2 12/12/2014 1311   NITRITE NEGATIVE 07/16/2015 1028   LEUKOCYTESUR LARGE* 07/16/2015 1028    ----------------------------------------------------------------------------------------------------------------   Imaging Results:    Dg Chest 1 View  07/16/2015  CLINICAL DATA:  Patient with increased altered mental status and weakness. EXAM: CHEST 1 VIEW COMPARISON:  Chest radiograph 12/12/2014. FINDINGS: Multiple monitoring leads overlie the patient. Patient is rotated to the right. Stable cardiac and mediastinal contours. Heterogeneous right-greater-than-left basilar interstitial opacities. Biapical pleural parenchymal thickening and scarring. No large pleural effusion or pneumothorax. IMPRESSION: Heterogeneous right-greater-than-left opacities which may represent an acute infectious process in the appropriate clinical setting. Electronically Signed    By: Lovey Newcomer M.D.   On: 07/16/2015 09:40   Ct Head Wo Contrast  07/16/2015  CLINICAL DATA:  Patient with generalized weakness. Syncopal episode. No trauma. EXAM: CT HEAD WITHOUT CONTRAST TECHNIQUE: Contiguous axial images were obtained from the base of the skull through the vertex without intravenous contrast. COMPARISON:  CT brain 12/27/2014. FINDINGS: Ventricles and sulci are appropriate for patient's age. Periventricular and subcortical white matter hypodensity compatible with chronic small vessel ischemic changes. No definite evidence for acute cortically based infarct, intracranial hemorrhage, mass lesion or mass-effect. Old left pontine lacunar infarct. Orbits are unremarkable. Opacification of right greater than left maxillary sinuses with mucosal thickening. Mastoid air cells are well aerated. Calvarium is intact. IMPRESSION: No acute intracranial process. Chronic small vessel ischemic changes and cortical atrophy. Electronically Signed   By: Lovey Newcomer M.D.   On: 07/16/2015 11:40    My personal review of EKG: Rhythm NSR, poor baseline with nonspecific ST changes   Assessment & Plan:    1. CAP with questionable mild hypothermia. Note his baseline routine temperature is around 97 per wife, he will be admitted to [place], warming blanket, blood sputum culture, IV Rocephin and Unasyn, feeding assistance and aspiration percussions, unfortunately he has refused to see speech therapy or a PT-OT. Plan will be to discharge home once he feels better.    2. History of stroke with right-sided hemiparesis. Supportive care. Continue Plavix and statin for secondary prevention, he has refused PT-OT-speech.   3. Dyslipidemia. On statin continue.   4. History of UTIs. Does have UTI, culture urine, continue Vanco and Unasyn follow culture results. he is on chronic nitrofurantoin which is on hold.     DVT Prophylaxis  Lovenox   AM Labs Ordered, also please review Full Orders  Family  Communication: Admission, patients condition and plan of care including tests being ordered have been discussed with the patient and wife who indicate understanding and agree with the plan and Code Status.  Code StatusFull  Likely DC to  Home  Condition GUARDED     Time spent in minutes : 35    SINGH,PRASHANT K M.D on 07/16/2015 at 11:52 AM  Between 7am to 7pm - Pager - [phone]  After 7pm go to [email or web address] - password [facility]  [facility] - Office  [phone]

## 2015-07-16 NOTE — Progress Notes (Signed)
MEDICATION RELATED CONSULT NOTE - INITIAL   Pharmacy Consult for unasyn Indication: aspiration PNA  No Known Allergies  Patient Measurements:   Adjusted Body Weight:   Vital Signs: Temp: 97.6 F (36.4 C) (12/20 1405) Temp Source: Oral (12/20 1405) BP: 127/59 mmHg (12/20 1405) Pulse Rate: 73 (12/20 1405) Intake/Output from previous day:   Intake/Output from this shift:    Labs:  Recent Labs  07/16/15 0929  WBC 10.9*  HGB 13.4  HCT 41.2  PLT 346  CREATININE 0.89  ALBUMIN 4.0  PROT 7.1  AST 20  ALT 27  ALKPHOS 82  BILITOT 0.8   CrCl cannot be calculated (Unknown ideal weight.).   Microbiology: Recent Results (from the past 720 hour(s))  Blood culture (routine x 2)     Status: None (Preliminary result)   Collection Time: 07/16/15  9:29 AM  Result Value Ref Range Status   Specimen Description BLOOD LEFT ANTECUBITAL  Final   Special Requests   Final    BOTTLES DRAWN AEROBIC AND ANAEROBIC 5CC Performed at [facility]    Culture PENDING  Incomplete   Report Status PENDING  Incomplete    Medical History: Past Medical History  Diagnosis Date  . Cancer ([place])   . Stroke ([place])   . Hearing loss     Assessment: Patient's an 81 y.o M presented to the ED on 12/20 for generalized weakness.  CXR showed opacities with suspicion for PNA and UA  (+) for leukocytes.  To start unasyn for infections.  - afeb, wbc 10.9, scr 0.89 (crcl~41), right side hemiparesis - 12/20 CXR: opacities, suspicious for infection - 12/20 UA: large leukocytes  12/20 unasyn>> 12/20 azithro>>  12/20 bcx x2: 12/20 ucx:   Goal of Therapy:  Eradication of infection  Plan:  - unasyn 3 gm IV q6h  Coralyn Roselli P 07/16/2015,2:26 PM

## 2015-07-16 NOTE — ED Notes (Signed)
Patient transported to CT 

## 2015-07-16 NOTE — ED Notes (Signed)
Pt resides at home. Full Code.Pt was attempting to stand and was too weak (generalize) to stand and was brought to floor. No trauma. Per wife weakness for 3 days however per caregivers for several months. ? Dementia however no DX. Pt seen by PCP this week for urinary symptoms current presentation however nothing done.

## 2015-07-16 NOTE — Progress Notes (Signed)
Pt transferred from the ED. AO x3 at transfer. Pt belongings are with pt at bedside. Pt has L side weakness from prior stroke. Pt contracted on R side. Pt orientated to the unit. No questions or concerns at this time.  Seyon Strader W Holliday Sheaffer, RN

## 2015-07-17 DIAGNOSIS — I10 Essential (primary) hypertension: Secondary | ICD-10-CM

## 2015-07-17 DIAGNOSIS — G8191 Hemiplegia, unspecified affecting right dominant side: Secondary | ICD-10-CM

## 2015-07-17 DIAGNOSIS — I63132 Cerebral infarction due to embolism of left carotid artery: Secondary | ICD-10-CM

## 2015-07-17 LAB — BASIC METABOLIC PANEL
Anion gap: 8 (ref 5–15)
BUN: 21 mg/dL — AB (ref 6–20)
CALCIUM: 8.8 mg/dL — AB (ref 8.9–10.3)
CHLORIDE: 105 mmol/L (ref 101–111)
CO2: 24 mmol/L (ref 22–32)
CREATININE: 0.89 mg/dL (ref 0.61–1.24)
GFR calc non Af Amer: >60 mL/min (ref >60)
Glucose, Bld: 84 mg/dL (ref 65–99)
Potassium: 4 mmol/L (ref 3.5–5.1)
SODIUM: 137 mmol/L (ref 135–145)

## 2015-07-17 LAB — CBC
HCT: 35.8 % — ABNORMAL LOW (ref 39.0–52.0)
Hemoglobin: 11.6 g/dL — ABNORMAL LOW (ref 13.0–17.0)
MCH: 29.1 pg (ref 26.0–34.0)
MCHC: 32.4 g/dL (ref 30.0–36.0)
MCV: 89.7 fL (ref 78.0–100.0)
PLATELETS: 313 10*3/uL (ref 150–400)
RBC: 3.99 MIL/uL — ABNORMAL LOW (ref 4.22–5.81)
RDW: 13.3 % (ref 11.5–15.5)
WBC: 10 10*3/uL (ref 4.0–10.5)

## 2015-07-17 LAB — URINE CULTURE

## 2015-07-17 MED ORDER — LORAZEPAM 2 MG/ML IJ SOLN
0.2500 mg | Freq: Once | INTRAMUSCULAR | Status: AC
  Administered 2015-07-17: 0.25 mg via INTRAVENOUS
  Filled 2015-07-17: qty 1

## 2015-07-17 NOTE — Progress Notes (Signed)
[facility] PROGRESS NOTE  [name] [id number] DOB: Sep 25, 1925 DOA: 07/16/2015 PCP: Simona Huh, MD  HPI/Brief narrative He is 81-year-old with history of CVA with right-sided hemiparesis who presented with hypothermia with suspected community acquired pneumonia  Assessment/Plan: 1. CAP versus aspiration pneumonia with sepsis present on admission -Patient presented with hypothermia and confusion per family the setting of pneumonia. -Patient has improved with IV Rocephin and Unasyn, -Given concerns of aspiration, speech path evaluation was recommended however the patient and family declines this. Last speech path recommendations from March 2016 recommended a dysphagia level III diet with nectar thick liquid. Family has demanded a regular diet the patient while refusing evaluation by speech pathology. Instead, the family states they will bring regular food and from home knowing the risks for aspiration.  2. History of stroke with right-sided hemiparesis. Supportive care. Continue Plavix and statin for secondary prevention, he has refused PT-OT-speech.   3. Dyslipidemia. On statin continue.   4. History of UTIs. Does have UTI, culture urine, continue Vanco and Unasyn follow culture results. he is on chronic nitrofurantoin which is on hold.     DVT Prophylaxis Lovenox   Code Status: Full Family Communication: Pt in room, family at bedside Disposition Plan: Home when off IV abx   Consultants:    Procedures:    Antibiotics: Anti-infectives    Start     Dose/Rate Route Frequency Ordered Stop   07/17/15 1200  azithromycin (ZITHROMAX) 500 mg in dextrose 5 % 250 mL IVPB     500 mg 250 mL/hr over 60 Minutes Intravenous Every 24 hours 07/16/15 1414     07/16/15 1500  Ampicillin-Sulbactam (UNASYN) 3 g in sodium chloride 0.9 % 100 mL IVPB     3 g 100 mL/hr over 60 Minutes Intravenous Every 6 hours 07/16/15 1438     07/16/15 1130  cefTRIAXone (ROCEPHIN) 1 g in  dextrose 5 % 50 mL IVPB     1 g 100 mL/hr over 30 Minutes Intravenous  Once 07/16/15 1123 07/16/15 1253   07/16/15 1130  azithromycin (ZITHROMAX) 500 mg in dextrose 5 % 250 mL IVPB  Status:  Discontinued     500 mg 250 mL/hr over 60 Minutes Intravenous  Once 07/16/15 1123 07/16/15 1344       HPI/Subjective: Feels better today  Objective: Filed Vitals:   07/16/15 2108 07/17/15 0500 07/17/15 0633 07/17/15 1500  BP: 134/68  149/71   Pulse: 73  64 77  Temp: 98.2 F (36.8 C)  97.4 F (36.3 C) 98 F (36.7 C)  TempSrc: Axillary  Oral Axillary  Resp: 16  16 18   Weight:  62.3 kg (137 lb 5.6 oz)    SpO2: 99%  99% 97%    Intake/Output Summary (Last 24 hours) at 07/17/15 1724 Last data filed at 07/17/15 1200  Gross per 24 hour  Intake   1203 ml  Output   1002 ml  Net    201 ml   Filed Weights   07/16/15 1900 07/17/15 0500  Weight: 61.3 kg (135 lb 2.3 oz) 62.3 kg (137 lb 5.6 oz)    Exam:   General:  Awake, in nad  Cardiovascular: regular, s1, s2  Respiratory: normal resp effort, no wheezing  Abdomen: soft,nondistended  Musculoskeletal: perfused,no clubbing   Data Reviewed: Basic Metabolic Panel:  Recent Labs Lab 07/16/15 0929 07/17/15 0539  NA 135 137  K 4.5 4.0  CL 102 105  CO2 26 24  GLUCOSE 108* 84  BUN 27* 21*  CREATININE 0.89 0.89  CALCIUM 9.7 8.8*   Liver Function Tests:  Recent Labs Lab 07/16/15 0929  AST 20  ALT 27  ALKPHOS 82  BILITOT 0.8  PROT 7.1  ALBUMIN 4.0   No results for input(s): LIPASE, AMYLASE in the last 168 hours. No results for input(s): AMMONIA in the last 168 hours. CBC:  Recent Labs Lab 07/16/15 0929 07/17/15 0539  WBC 10.9* 10.0  NEUTROABS 8.9*  --   HGB 13.4 11.6*  HCT 41.2 35.8*  MCV 89.8 89.7  PLT 346 313   Cardiac Enzymes:  Recent Labs Lab 07/16/15 1242  TROPONINI <0.03   BNP (last 3 results) No results for input(s): BNP in the last 8760 hours.  ProBNP (last 3 results) No results for input(s):  PROBNP in the last 8760 hours.  CBG: No results for input(s): GLUCAP in the last 168 hours.  Recent Results (from the past 240 hour(s))  Blood culture (routine x 2)     Status: None (Preliminary result)   Collection Time: 07/16/15  9:29 AM  Result Value Ref Range Status   Specimen Description BLOOD LEFT ANTECUBITAL  Final   Special Requests BOTTLES DRAWN AEROBIC AND ANAEROBIC 5CC  Final   Culture   Final    NO GROWTH < 24 HOURS Performed at [facility]    Report Status PENDING  Incomplete  Urine culture     Status: None   Collection Time: 07/16/15 10:28 AM  Result Value Ref Range Status   Specimen Description URINE, CATHETERIZED  Final   Special Requests NONE  Final   Culture   Final    MULTIPLE SPECIES PRESENT, SUGGEST RECOLLECTION Performed at [facility]    Report Status 07/17/2015 FINAL  Final  Blood culture (routine x 2)     Status: None (Preliminary result)   Collection Time: 07/16/15 10:45 AM  Result Value Ref Range Status   Specimen Description BLOOD LEFT FOREARM  Final   Special Requests IN PEDIATRIC BOTTLE 3ML  Final   Culture   Final    NO GROWTH < 24 HOURS Performed at [facility]    Report Status PENDING  Incomplete     Studies: Dg Chest 1 View  07/16/2015  CLINICAL DATA:  Patient with increased altered mental status and weakness. EXAM: CHEST 1 VIEW COMPARISON:  Chest radiograph 12/12/2014. FINDINGS: Multiple monitoring leads overlie the patient. Patient is rotated to the right. Stable cardiac and mediastinal contours. Heterogeneous right-greater-than-left basilar interstitial opacities. Biapical pleural parenchymal thickening and scarring. No large pleural effusion or pneumothorax. IMPRESSION: Heterogeneous right-greater-than-left opacities which may represent an acute infectious process in the appropriate clinical setting. Electronically Signed   By: Lovey Newcomer M.D.   On: 07/16/2015 09:40   Ct Head Wo Contrast  07/16/2015  CLINICAL  DATA:  Patient with generalized weakness. Syncopal episode. No trauma. EXAM: CT HEAD WITHOUT CONTRAST TECHNIQUE: Contiguous axial images were obtained from the base of the skull through the vertex without intravenous contrast. COMPARISON:  CT brain 12/27/2014. FINDINGS: Ventricles and sulci are appropriate for patient's age. Periventricular and subcortical white matter hypodensity compatible with chronic small vessel ischemic changes. No definite evidence for acute cortically based infarct, intracranial hemorrhage, mass lesion or mass-effect. Old left pontine lacunar infarct. Orbits are unremarkable. Opacification of right greater than left maxillary sinuses with mucosal thickening. Mastoid air cells are well aerated. Calvarium is intact. IMPRESSION: No acute intracranial process. Chronic small vessel ischemic changes and cortical atrophy. Electronically Signed  By: Lovey Newcomer M.D.   On: 07/16/2015 11:40    Scheduled Meds: . ampicillin-sulbactam (UNASYN) IV  3 g Intravenous Q6H  . atorvastatin  20 mg Oral q1800  . azithromycin  500 mg Intravenous Q24H  . buPROPion  100 mg Oral q morning - 10a  . clopidogrel  75 mg Oral Daily  . enoxaparin (LOVENOX) injection  40 mg Subcutaneous Q24H  . polyethylene glycol  17 g Oral Daily  . sodium chloride  3 mL Intravenous Q12H  . 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 If 7PM-7AM, please contact night-coverage at [email or web address], password [facility] 07/17/2015, 5:24 PM  LOS: 1 day

## 2015-07-17 NOTE — Clinical Documentation Improvement (Signed)
Hospitalist  (Query responses must be documented in the progress notes and discharge summary, not on the BPA form itself.  BPA's are not part of the permanent record.)  Please document if the diagnosis of "Sepsis" documented by the ED provider, has been:   - Ruled Out and is not applicable to this admission  - Confirmed, Present on Admission and applicable to this admission  - Unable to clinically determine  Clinical Information: "CXR showed pneumonia. Has UTI as well. Given ceftriaxone, azithro. Patient hypothermic as well, placed on warm blankets. Will admit for sepsis from UTI, pneumonia." documented by ED provider 07/06/15 at 11:24 am    Please exercise your independent, professional judgment when responding. A specific answer is not anticipated or expected.   Thank You, Erling Conte  RN BSN CCDS [phone] Health Information Management [place]

## 2015-07-17 NOTE — Care Management Note (Signed)
Case Management Note  Patient Details  Name: [name] MRN: [id number] Date of Birth: Jan 27, 1926  Subjective/Objective:  81 y/o m admitted w/CAP, UTI. From home, has 24hr private duty asst. Per nsg no PT eval needed per family.                  Action/Plan:d/c plan home.   Expected Discharge Date:   (UNKNOWN)               Expected Discharge Plan:  Home/Self Care  In-House Referral:     Discharge planning Services  CM Consult  Post Acute Care Choice:    Choice offered to:     DME Arranged:    DME Agency:     HH Arranged:    HH Agency:     Status of Service:  In process, will continue to follow  Medicare Important Message Given:    Date Medicare IM Given:    Medicare IM give by:    Date Additional Medicare IM Given:    Additional Medicare Important Message give by:     If discussed at [place] of Stay Meetings, dates discussed:    Additional Comments:  Dessa Phi, RN 07/17/2015, 2:21 PM

## 2015-07-18 ENCOUNTER — Ambulatory Visit: Payer: Medicare Other

## 2015-07-18 LAB — CBC
HEMATOCRIT: 34.2 % — AB (ref 39.0–52.0)
HEMOGLOBIN: 11.3 g/dL — AB (ref 13.0–17.0)
MCH: 29 pg (ref 26.0–34.0)
MCHC: 33 g/dL (ref 30.0–36.0)
MCV: 87.9 fL (ref 78.0–100.0)
Platelets: 296 10*3/uL (ref 150–400)
RBC: 3.89 MIL/uL — ABNORMAL LOW (ref 4.22–5.81)
RDW: 13.2 % (ref 11.5–15.5)
WBC: 11 10*3/uL — AB (ref 4.0–10.5)

## 2015-07-18 LAB — BASIC METABOLIC PANEL
ANION GAP: 7 (ref 5–15)
BUN: 16 mg/dL (ref 6–20)
CALCIUM: 9 mg/dL (ref 8.9–10.3)
CO2: 23 mmol/L (ref 22–32)
Chloride: 106 mmol/L (ref 101–111)
Creatinine, Ser: 0.83 mg/dL (ref 0.61–1.24)
GFR calc Af Amer: >60 mL/min (ref >60)
GLUCOSE: 93 mg/dL (ref 65–99)
POTASSIUM: 3.9 mmol/L (ref 3.5–5.1)
SODIUM: 136 mmol/L (ref 135–145)

## 2015-07-18 MED ORDER — LORAZEPAM 2 MG/ML IJ SOLN
1.0000 mg | INTRAMUSCULAR | Status: DC | PRN
  Administered 2015-07-18: 1 mg via INTRAVENOUS
  Filled 2015-07-18: qty 1

## 2015-07-18 NOTE — Progress Notes (Signed)
[facility] PROGRESS NOTE  TASH INES [id number] DOB: 1926-07-02 DOA: 07/16/2015 PCP: Simona Huh, MD  HPI/Brief narrative He is 81-year-old with history of CVA with right-sided hemiparesis who presented with hypothermia with suspected community acquired pneumonia  Assessment/Plan: 1. CAP versus aspiration pneumonia with sepsis present on admission -Patient presented with hypothermia and confusion per family the setting of possible pneumonia and UTI. -Patient is showing improvement with IV Rocephin and Unasyn, -Given concerns of aspiration, speech path evaluation was recommended however the patient and family declines this. Last speech path recommendations from March 2016 recommended a dysphagia level III diet with nectar thick liquid. Family has demanded a regular diet the patient while refusing evaluation by speech pathology. Family at bedside still insists on a regular diet. All family members present understand the risk of aspiration yet still demand continued regular diet. Family states they're willing to take responsibility for any adverse events should the patient decompensate as a result of solid food.  2. History of stroke with right-sided hemiparesis. Supportive care. Continue Plavix and statin for secondary prevention, patient and family has refused PT-OT-speech evaluation.  3. Dyslipidemia. On statin continue.   4. UTI with sepsis present on admission,  -Urine culture is notable for multiple organisms. Question validity of urine culture -Will repeat urine culture and continue current antibiotics for now   DVT Prophylaxis Lovenox   Code Status: Full Family Communication: Pt in room, family at bedside Disposition Plan: Home when off IV abx   Consultants:    Procedures:    Antibiotics: Anti-infectives    Start     Dose/Rate Route Frequency Ordered Stop   07/17/15 1200  azithromycin (ZITHROMAX) 500 mg in dextrose 5 % 250 mL IVPB     500 mg 250  mL/hr over 60 Minutes Intravenous Every 24 hours 07/16/15 1414     07/16/15 1500  Ampicillin-Sulbactam (UNASYN) 3 g in sodium chloride 0.9 % 100 mL IVPB     3 g 100 mL/hr over 60 Minutes Intravenous Every 6 hours 07/16/15 1438     07/16/15 1130  cefTRIAXone (ROCEPHIN) 1 g in dextrose 5 % 50 mL IVPB     1 g 100 mL/hr over 30 Minutes Intravenous  Once 07/16/15 1123 07/16/15 1253   07/16/15 1130  azithromycin (ZITHROMAX) 500 mg in dextrose 5 % 250 mL IVPB  Status:  Discontinued     500 mg 250 mL/hr over 60 Minutes Intravenous  Once 07/16/15 1123 07/16/15 1344      HPI/Subjective: Reports feeling better. Noted to be slightly more confused  Objective: Filed Vitals:   07/17/15 1500 07/17/15 1800 07/17/15 2150 07/18/15 0558  BP: 159/84 128/57 143/66 124/58  Pulse: 77 69 84 78  Temp: 98 F (36.7 C) 97.4 F (36.3 C)  98 F (36.7 C)  TempSrc: Axillary Oral  Oral  Resp: 18 16 18 20   Weight:    61.6 kg (135 lb 12.9 oz)  SpO2: 97% 99%  95%    Intake/Output Summary (Last 24 hours) at 07/18/15 1352 Last data filed at 07/18/15 0900  Gross per 24 hour  Intake    895 ml  Output    150 ml  Net    745 ml   Filed Weights   07/16/15 1900 07/17/15 0500 07/18/15 0558  Weight: 61.3 kg (135 lb 2.3 oz) 62.3 kg (137 lb 5.6 oz) 61.6 kg (135 lb 12.9 oz)    Exam:   General:  Awake, in nad, lying in bed  Cardiovascular: regular,  s1, s2  Respiratory: normal resp effort, no wheezing  Abdomen: soft,nondistended,  Musculoskeletal: perfused,no clubbing, no cyanosis  Data Reviewed: Basic Metabolic Panel:  Recent Labs Lab 07/16/15 0929 07/17/15 0539 07/18/15 0509  NA 135 137 136  K 4.5 4.0 3.9  CL 102 105 106  CO2 26 24 23   GLUCOSE 108* 84 93  BUN 27* 21* 16  CREATININE 0.89 0.89 0.83  CALCIUM 9.7 8.8* 9.0   Liver Function Tests:  Recent Labs Lab 07/16/15 0929  AST 20  ALT 27  ALKPHOS 82  BILITOT 0.8  PROT 7.1  ALBUMIN 4.0   No results for input(s): LIPASE, AMYLASE in  the last 168 hours. No results for input(s): AMMONIA in the last 168 hours. CBC:  Recent Labs Lab 07/16/15 0929 07/17/15 0539 07/18/15 0509  WBC 10.9* 10.0 11.0*  NEUTROABS 8.9*  --   --   HGB 13.4 11.6* 11.3*  HCT 41.2 35.8* 34.2*  MCV 89.8 89.7 87.9  PLT 346 313 296   Cardiac Enzymes:  Recent Labs Lab 07/16/15 1242  TROPONINI <0.03   BNP (last 3 results) No results for input(s): BNP in the last 8760 hours.  ProBNP (last 3 results) No results for input(s): PROBNP in the last 8760 hours.  CBG: No results for input(s): GLUCAP in the last 168 hours.  Recent Results (from the past 240 hour(s))  Blood culture (routine x 2)     Status: None (Preliminary result)   Collection Time: 07/16/15  9:29 AM  Result Value Ref Range Status   Specimen Description BLOOD LEFT ANTECUBITAL  Final   Special Requests BOTTLES DRAWN AEROBIC AND ANAEROBIC 5CC  Final   Culture   Final    NO GROWTH < 24 HOURS Performed at [facility]    Report Status PENDING  Incomplete  Urine culture     Status: None   Collection Time: 07/16/15 10:28 AM  Result Value Ref Range Status   Specimen Description URINE, CATHETERIZED  Final   Special Requests NONE  Final   Culture   Final    MULTIPLE SPECIES PRESENT, SUGGEST RECOLLECTION Performed at [facility]    Report Status 07/17/2015 FINAL  Final  Blood culture (routine x 2)     Status: None (Preliminary result)   Collection Time: 07/16/15 10:45 AM  Result Value Ref Range Status   Specimen Description BLOOD LEFT FOREARM  Final   Special Requests IN PEDIATRIC BOTTLE 3ML  Final   Culture   Final    NO GROWTH < 24 HOURS Performed at [facility]    Report Status PENDING  Incomplete     Studies: No results found.  Scheduled Meds: . ampicillin-sulbactam (UNASYN) IV  3 g Intravenous Q6H  . atorvastatin  20 mg Oral q1800  . azithromycin  500 mg Intravenous Q24H  . buPROPion  100 mg Oral q morning - 10a  . clopidogrel  75 mg  Oral Daily  . enoxaparin (LOVENOX) injection  40 mg Subcutaneous Q24H  . polyethylene glycol  17 g Oral Daily  . sodium chloride  3 mL Intravenous Q12H  . tamsulosin  0.4 mg Oral QHS   Continuous Infusions:   Principal Problem:   CAP (community acquired pneumonia) Active Problems:   Embolic stroke involving left carotid artery (HCC)   Essential hypertension   Right hemiparesis ([place])   Dysphagia, post-stroke   Sepsis ([place])   UTI (lower urinary tract infection)    CHIU, STEPHEN K  [facility]  Pager [phone]. If 7PM-7AM, please contact night-coverage at [email or web address], password [facility] 07/18/2015, 1:52 PM  LOS: 2 days

## 2015-07-19 DIAGNOSIS — N390 Urinary tract infection, site not specified: Secondary | ICD-10-CM

## 2015-07-19 LAB — BASIC METABOLIC PANEL
ANION GAP: 9 (ref 5–15)
BUN: 13 mg/dL (ref 6–20)
CALCIUM: 9.2 mg/dL (ref 8.9–10.3)
CO2: 24 mmol/L (ref 22–32)
CREATININE: 0.88 mg/dL (ref 0.61–1.24)
Chloride: 102 mmol/L (ref 101–111)
GFR calc Af Amer: >60 mL/min (ref >60)
GLUCOSE: 97 mg/dL (ref 65–99)
Potassium: 4.3 mmol/L (ref 3.5–5.1)
Sodium: 135 mmol/L (ref 135–145)

## 2015-07-19 LAB — CBC
HCT: 35.9 % — ABNORMAL LOW (ref 39.0–52.0)
HEMOGLOBIN: 11.8 g/dL — AB (ref 13.0–17.0)
MCH: 28.8 pg (ref 26.0–34.0)
MCHC: 32.9 g/dL (ref 30.0–36.0)
MCV: 87.6 fL (ref 78.0–100.0)
Platelets: 277 10*3/uL (ref 150–400)
RBC: 4.1 MIL/uL — ABNORMAL LOW (ref 4.22–5.81)
RDW: 13 % (ref 11.5–15.5)
WBC: 10.8 10*3/uL — ABNORMAL HIGH (ref 4.0–10.5)

## 2015-07-19 MED ORDER — AMOXICILLIN-POT CLAVULANATE 875-125 MG PO TABS
1.0000 | ORAL_TABLET | Freq: Two times a day (BID) | ORAL | Status: DC
  Administered 2015-07-19 – 2015-07-20 (×2): 1 via ORAL
  Filled 2015-07-19 (×3): qty 1

## 2015-07-19 NOTE — Progress Notes (Signed)
[facility] PROGRESS NOTE  SELMA BRYER [id number] DOB: 06-Dec-1925 DOA: 07/16/2015 PCP: Simona Huh, MD  HPI/Brief narrative He is 81-year-old with history of CVA with right-sided hemiparesis who presented with hypothermia with suspected community acquired pneumonia  Assessment/Plan: 1. CAP versus aspiration pneumonia with sepsis present on admission -Patient presented with hypothermia and confusion per family the setting of possible pneumonia and UTI. -Patient has shown improvement with IV Rocephin and Unasyn -Patient has transitioned to Augmentin and has remained stable thus far -Given concerns of aspiration, speech path evaluation was recommended however the patient and family declines this. Last speech path recommendations from March 2016 recommended a dysphagia level III diet with nectar thick liquid. Family has demanded a regular diet the patient while refusing evaluation by speech pathology. Family at bedside still insists on a regular diet. All family members present understand the risk of aspiration yet still demand continued regular diet. Family states they're willing to take responsibility for any adverse events should the patient decompensate as a result of solid food. -Patient remains confused, worse at night. Strongly suspect possible vascular dementia (see below)  2. History of stroke with right-sided hemiparesis. Supportive care. Continue Plavix and statin for secondary prevention, patient and family has refused PT-OT-speech evaluation.  3. Dyslipidemia. On statin continue.   4. UTI with sepsis present on admission,  -Urine culture is notable for multiple organisms. Question validity of urine culture -Will repeat urine culture and continue current antibiotics for now  5. Likely Vascular Dementia -Patient with continued bouts of confusion, worse at night and requiring mild sedatives -Previous brain scans reviewed. Patient has a history of advanced cerebral  atrophy noted on imaging that would be consistent with dementia -Recommend inpatient psychiatric evaluation. However, the patient's family adamantly declines this at this time -Discussed with patient's caregiver who is also in the room who reports periods of forgetfulness and confusion over the past month   DVT Prophylaxis Lovenox   Code Status: Full Family Communication: Pt in room, family at bedside Disposition Plan: Possible DC home on 07/20/2015   Consultants:    Procedures:    Antibiotics: Anti-infectives    Start     Dose/Rate Route Frequency Ordered Stop   07/19/15 1600  amoxicillin-clavulanate (AUGMENTIN) 875-125 MG per tablet 1 tablet     1 tablet Oral Every 12 hours 07/19/15 1410     07/17/15 1200  azithromycin (ZITHROMAX) 500 mg in dextrose 5 % 250 mL IVPB  Status:  Discontinued     500 mg 250 mL/hr over 60 Minutes Intravenous Every 24 hours 07/16/15 1414 07/19/15 1410   07/16/15 1500  Ampicillin-Sulbactam (UNASYN) 3 g in sodium chloride 0.9 % 100 mL IVPB  Status:  Discontinued     3 g 100 mL/hr over 60 Minutes Intravenous Every 6 hours 07/16/15 1438 07/19/15 1410   07/16/15 1130  cefTRIAXone (ROCEPHIN) 1 g in dextrose 5 % 50 mL IVPB     1 g 100 mL/hr over 30 Minutes Intravenous  Once 07/16/15 1123 07/16/15 1253   07/16/15 1130  azithromycin (ZITHROMAX) 500 mg in dextrose 5 % 250 mL IVPB  Status:  Discontinued     500 mg 250 mL/hr over 60 Minutes Intravenous  Once 07/16/15 1123 07/16/15 1344      HPI/Subjective: Patient is without complaints. Staff reports intermittent bouts of confusion, worse at night  Objective: Filed Vitals:   07/18/15 1519 07/18/15 2040 07/19/15 0457 07/19/15 1445  BP: 126/58 130/76 115/71 125/65  Pulse: 72 74 70  72  Temp: 98.2 F (36.8 C) 99 F (37.2 C) 98 F (36.7 C) 98.2 F (36.8 C)  TempSrc: Oral Oral Axillary Axillary  Resp: 18 18 18 18   Weight:   60.4 kg (133 lb 2.5 oz)   SpO2: 98% 97% 98% 99%    Intake/Output Summary  (Last 24 hours) at 07/19/15 1751 Last data filed at 07/19/15 1200  Gross per 24 hour  Intake    240 ml  Output      0 ml  Net    240 ml   Filed Weights   07/17/15 0500 07/18/15 0558 07/19/15 0457  Weight: 62.3 kg (137 lb 5.6 oz) 61.6 kg (135 lb 12.9 oz) 60.4 kg (133 lb 2.5 oz)    Exam:   General:  Awake, in nad, lying in bed, conversant, pleasant  Cardiovascular: regular, s1, s2  Respiratory: normal resp effort, no wheezing  Abdomen: soft,nondistended,  Musculoskeletal: perfused, no cyanosis  Data Reviewed: Basic Metabolic Panel:  Recent Labs Lab 07/16/15 0929 07/17/15 0539 07/18/15 0509 07/19/15 0515  NA 135 137 136 135  K 4.5 4.0 3.9 4.3  CL 102 105 106 102  CO2 26 24 23 24   GLUCOSE 108* 84 93 97  BUN 27* 21* 16 13  CREATININE 0.89 0.89 0.83 0.88  CALCIUM 9.7 8.8* 9.0 9.2   Liver Function Tests:  Recent Labs Lab 07/16/15 0929  AST 20  ALT 27  ALKPHOS 82  BILITOT 0.8  PROT 7.1  ALBUMIN 4.0   No results for input(s): LIPASE, AMYLASE in the last 168 hours. No results for input(s): AMMONIA in the last 168 hours. CBC:  Recent Labs Lab 07/16/15 0929 07/17/15 0539 07/18/15 0509 07/19/15 0515  WBC 10.9* 10.0 11.0* 10.8*  NEUTROABS 8.9*  --   --   --   HGB 13.4 11.6* 11.3* 11.8*  HCT 41.2 35.8* 34.2* 35.9*  MCV 89.8 89.7 87.9 87.6  PLT 346 313 296 277   Cardiac Enzymes:  Recent Labs Lab 07/16/15 1242  TROPONINI <0.03   BNP (last 3 results) No results for input(s): BNP in the last 8760 hours.  ProBNP (last 3 results) No results for input(s): PROBNP in the last 8760 hours.  CBG: No results for input(s): GLUCAP in the last 168 hours.  Recent Results (from the past 240 hour(s))  Blood culture (routine x 2)     Status: None (Preliminary result)   Collection Time: 07/16/15  9:29 AM  Result Value Ref Range Status   Specimen Description BLOOD LEFT ANTECUBITAL  Final   Special Requests BOTTLES DRAWN AEROBIC AND ANAEROBIC 5CC  Final    Culture   Final    NO GROWTH 3 DAYS Performed at [facility]    Report Status PENDING  Incomplete  Urine culture     Status: None   Collection Time: 07/16/15 10:28 AM  Result Value Ref Range Status   Specimen Description URINE, CATHETERIZED  Final   Special Requests NONE  Final   Culture   Final    MULTIPLE SPECIES PRESENT, SUGGEST RECOLLECTION Performed at [facility]    Report Status 07/17/2015 FINAL  Final  Blood culture (routine x 2)     Status: None (Preliminary result)   Collection Time: 07/16/15 10:45 AM  Result Value Ref Range Status   Specimen Description BLOOD LEFT FOREARM  Final   Special Requests IN PEDIATRIC BOTTLE 3ML  Final   Culture   Final    NO GROWTH 3 DAYS Performed  at [facility]    Report Status PENDING  Incomplete  Culture, Urine     Status: None (Preliminary result)   Collection Time: 07/18/15  3:52 PM  Result Value Ref Range Status   Specimen Description URINE, CLEAN CATCH  Final   Special Requests NONE  Final   Culture   Final    NO GROWTH < 24 HOURS Performed at [facility]    Report Status PENDING  Incomplete     Studies: No results found.  Scheduled Meds: . amoxicillin-clavulanate  1 tablet Oral Q12H  . atorvastatin  20 mg Oral q1800  . buPROPion  100 mg Oral q morning - 10a  . clopidogrel  75 mg Oral Daily  . enoxaparin (LOVENOX) injection  40 mg Subcutaneous Q24H  . polyethylene glycol  17 g Oral Daily  . sodium chloride  3 mL Intravenous Q12H  . tamsulosin  0.4 mg Oral QHS   Continuous Infusions:   Principal Problem:   CAP (community acquired pneumonia) Active Problems:   Embolic stroke involving left carotid artery (HCC)   Essential hypertension   Right hemiparesis (HCC)   Dysphagia, post-stroke   Sepsis ([place])   UTI (lower urinary tract infection)    Federick Levene, [place] [facility] Pager [phone]. If 7PM-7AM, please contact night-coverage at [email or web address], password [facility] 07/19/2015,  5:51 PM  LOS: 3 days

## 2015-07-19 NOTE — Progress Notes (Signed)
Date: July 19, 2015 Chart reviewed for concurrent status and case management needs. Will continue to follow patient for changes and needs: Velva Harman, RN, BSN, [place]   [phone]

## 2015-07-19 NOTE — Progress Notes (Signed)
Pharmacy Antibiotic Follow-up Note  [name] is a 81 y.o. year-old male admitted on 07/16/2015.  The patient is currently on day 4 of Unasyn 3g IV q6h and Azithromycin 500 mg IV q24h for UTI + possible aspiration pneumonia.  Assessment/Plan: Per discussing with MD, plan is to transition to Augmentin today.  Temp (24hrs), Avg:98.4 F (36.9 C), Min:98 F (36.7 C), Max:99 F (37.2 C)   Recent Labs Lab 07/16/15 0929 07/17/15 0539 07/18/15 0509 07/19/15 0515  WBC 10.9* 10.0 11.0* 10.8*    Recent Labs Lab 07/16/15 0929 07/17/15 0539 07/18/15 0509 07/19/15 0515  CREATININE 0.89 0.89 0.83 0.88   Estimated Creatinine Clearance: 50.5 mL/min (by C-G formula based on Cr of 0.88).    No Known Allergies  Antimicrobials this admission: 12/20 CTX x 1 12/20 azithro >> 12/20 Unasyn>>  Levels/dose changes this admission: -  Microbiology results: 12/20 ucx: multiple species, suggest recollection 12/20 blood cx: ngtd < 24h  Thank you for allowing pharmacy to be a part of this patient's care.  Hershal Coria PharmD 07/19/2015 10:25 AM

## 2015-07-20 DIAGNOSIS — I69391 Dysphagia following cerebral infarction: Secondary | ICD-10-CM

## 2015-07-20 LAB — URINE CULTURE: CULTURE: NO GROWTH

## 2015-07-20 LAB — BASIC METABOLIC PANEL
Anion gap: 8 (ref 5–15)
BUN: 15 mg/dL (ref 6–20)
CHLORIDE: 101 mmol/L (ref 101–111)
CO2: 28 mmol/L (ref 22–32)
Calcium: 9.1 mg/dL (ref 8.9–10.3)
Creatinine, Ser: 0.88 mg/dL (ref 0.61–1.24)
GFR calc Af Amer: >60 mL/min (ref >60)
GLUCOSE: 109 mg/dL — AB (ref 65–99)
POTASSIUM: 3.6 mmol/L (ref 3.5–5.1)
Sodium: 137 mmol/L (ref 135–145)

## 2015-07-20 LAB — CBC
HEMATOCRIT: 38.7 % — AB (ref 39.0–52.0)
Hemoglobin: 12.5 g/dL — ABNORMAL LOW (ref 13.0–17.0)
MCH: 28.7 pg (ref 26.0–34.0)
MCHC: 32.3 g/dL (ref 30.0–36.0)
MCV: 89 fL (ref 78.0–100.0)
Platelets: 329 10*3/uL (ref 150–400)
RBC: 4.35 MIL/uL (ref 4.22–5.81)
RDW: 13.1 % (ref 11.5–15.5)
WBC: 12 10*3/uL — ABNORMAL HIGH (ref 4.0–10.5)

## 2015-07-20 MED ORDER — AMOXICILLIN-POT CLAVULANATE 875-125 MG PO TABS: 1.0000 | ORAL_TABLET | Freq: Two times a day (BID) | ORAL | Status: DC

## 2015-07-20 NOTE — Progress Notes (Signed)
Pt left at this time with his spouse, daughter, and caregiver. Pt alert and without c/o. Discharge instructions/presciption given/explained with pt and spouse verbalizing understanding.  Followup appointments noted.

## 2015-07-20 NOTE — Discharge Summary (Signed)
Physician Discharge Summary  [name] [id number] DOB: 1925/09/25 DOA: 07/16/2015  PCP: Simona Huh, MD  Admit date: 07/16/2015 Discharge date: 07/20/2015  Time spent: 20 minutes  Recommendations for Outpatient Follow-up:  1. Follow up with PCP in 1-2 weeks   Discharge Diagnoses:  Principal Problem:   CAP (community acquired pneumonia) Active Problems:   Embolic stroke involving left carotid artery ([place])   Essential hypertension   Right hemiparesis ([place])   Dysphagia, post-stroke   Sepsis ([place])   UTI (lower urinary tract infection)   Discharge Condition: Improved  Diet recommendation: Recommend Dysphagia 3 diet based on last SLP eval in 3/16 unless cleared by SLP, Family has been feeding regular diet   Filed Weights   07/17/15 0500 07/18/15 0558 07/19/15 0457  Weight: 62.3 kg (137 lb 5.6 oz) 61.6 kg (135 lb 12.9 oz) 60.4 kg (133 lb 2.5 oz)    History of present illness:  Please review dictated H and P from 12/23 for details. Briefly, 81 year old with history of CVA with right-sided hemiparesis who presented with hypothermia with suspected community acquired pneumonia  Hospital Course:  1. CAP versus aspiration pneumonia with sepsis present on admission -Patient presented with hypothermia and confusion per family the setting of possible pneumonia and UTI. -Patient has shown improvement with IV Rocephin and Unasyn -Patient was transitioned to Augmentin and has remained stable thus far -Given concerns of aspiration, speech path evaluation was recommended however the patient and family declines this. Last speech path recommendations from March 2016 recommended a dysphagia level III diet with nectar thick liquid. During this admission, family had demanded a regular diet the patient while refusing evaluation by speech pathology. All family members present understand the risk of aspiration yet still demand continued regular diet. Family states they're willing to take  responsibility for any adverse events should the patient decompensate as a result of solid food. -Patient remained intermittently confused, worse at night. Strongly suspect possible vascular dementia (see below)  2. History of stroke with right-sided hemiparesis. Supportive care. Continue Plavix and statin for secondary prevention, patient and family has refused PT-OT-speech evaluation.  3. Dyslipidemia. On statin continue.   4. UTI with sepsis present on admission,  -Urine culture is notable for multiple organisms. Question validity of urine culture -Repeat urine culture without growth -complete above course of antibiotics for now  5. Likely Vascular Dementia -Patient with continued bouts of confusion, worse at night and requiring mild sedatives -Previous brain scans reviewed. Patient has a history of advanced cerebral atrophy noted on imaging that would be consistent with dementia - presented these findings to the pt and family who are now aware -Recommend inpatient psychiatric evaluation. However, the patient's family adamantly declines this at this time -Discussed with patient's caregiver who is also in the room who reports periods of forgetfulness and confusion over the past month  DVT Prophylaxis Lovenox while inpatient   Discharge Exam: Filed Vitals:   07/19/15 0457 07/19/15 1445 07/19/15 2216 07/20/15 0605  BP: 115/71 125/65 148/63 131/59  Pulse: 70 72 76 80  Temp: 98 F (36.7 C) 98.2 F (36.8 C) 97.5 F (36.4 C) 97.5 F (36.4 C)  TempSrc: Axillary Axillary Oral Axillary  Resp: 18 18 16 18   Weight: 60.4 kg (133 lb 2.5 oz)     SpO2: 98% 99% 98% 98%    General: Awake, in nad Cardiovascular: regular, s1, s2 Respiratory: normal resp effort, no wheezing  Discharge Instructions     Medication List    TAKE  these medications        amoxicillin-clavulanate 875-125 MG tablet  Commonly known as:  AUGMENTIN  Take 1 tablet by mouth every 12 (twelve) hours.      atorvastatin 20 MG tablet  Commonly known as:  LIPITOR  Take 1 tablet (20 mg total) by mouth daily at 6 PM.     buPROPion 100 MG tablet  Commonly known as:  WELLBUTRIN  Take 100 mg by mouth every morning. Pt not sure of dosage     clopidogrel 75 MG tablet  Commonly known as:  PLAVIX  Take 1 tablet (75 mg total) by mouth daily.     FLUAD 0.5 ML Susy  Generic drug:  Influenza Vac A&B Surf Ant Adj  06/14/15     nitrofurantoin 50 MG capsule  Commonly known as:  MACRODANTIN  Take 50 mg by mouth at bedtime.     tamsulosin 0.4 MG Caps capsule  Commonly known as:  FLOMAX  Take 0.4 mg by mouth at bedtime.     tiZANidine 4 MG tablet  Commonly known as:  ZANAFLEX  Take 1 tablet (4 mg total) by mouth 3 (three) times daily.       No Known Allergies Follow-up Information    Follow up with Simona Huh, MD. Schedule an appointment as soon as possible for a visit in 1 week.   Specialty:  Family Medicine   Why:  Hospital follow up   Contact information:   [address] [place] [address] [phone]        The results of significant diagnostics from this hospitalization (including imaging, microbiology, ancillary and laboratory) are listed below for reference.    Significant Diagnostic Studies: Dg Chest 1 View  07/16/2015  CLINICAL DATA:  Patient with increased altered mental status and weakness. EXAM: CHEST 1 VIEW COMPARISON:  Chest radiograph 12/12/2014. FINDINGS: Multiple monitoring leads overlie the patient. Patient is rotated to the right. Stable cardiac and mediastinal contours. Heterogeneous right-greater-than-left basilar interstitial opacities. Biapical pleural parenchymal thickening and scarring. No large pleural effusion or pneumothorax. IMPRESSION: Heterogeneous right-greater-than-left opacities which may represent an acute infectious process in the appropriate clinical setting. Electronically Signed   By: Lovey Newcomer M.D.   On: 07/16/2015 09:40    Ct Head Wo Contrast  07/16/2015  CLINICAL DATA:  Patient with generalized weakness. Syncopal episode. No trauma. EXAM: CT HEAD WITHOUT CONTRAST TECHNIQUE: Contiguous axial images were obtained from the base of the skull through the vertex without intravenous contrast. COMPARISON:  CT brain 12/27/2014. FINDINGS: Ventricles and sulci are appropriate for patient's age. Periventricular and subcortical white matter hypodensity compatible with chronic small vessel ischemic changes. No definite evidence for acute cortically based infarct, intracranial hemorrhage, mass lesion or mass-effect. Old left pontine lacunar infarct. Orbits are unremarkable. Opacification of right greater than left maxillary sinuses with mucosal thickening. Mastoid air cells are well aerated. Calvarium is intact. IMPRESSION: No acute intracranial process. Chronic small vessel ischemic changes and cortical atrophy. Electronically Signed   By: Lovey Newcomer M.D.   On: 07/16/2015 11:40    Microbiology: Recent Results (from the past 240 hour(s))  Blood culture (routine x 2)     Status: None (Preliminary result)   Collection Time: 07/16/15  9:29 AM  Result Value Ref Range Status   Specimen Description BLOOD LEFT ANTECUBITAL  Final   Special Requests BOTTLES DRAWN AEROBIC AND ANAEROBIC 5CC  Final   Culture   Final    NO GROWTH 3 DAYS Performed  at [facility]    Report Status PENDING  Incomplete  Urine culture     Status: None   Collection Time: 07/16/15 10:28 AM  Result Value Ref Range Status   Specimen Description URINE, CATHETERIZED  Final   Special Requests NONE  Final   Culture   Final    MULTIPLE SPECIES PRESENT, SUGGEST RECOLLECTION Performed at [facility]    Report Status 07/17/2015 FINAL  Final  Blood culture (routine x 2)     Status: None (Preliminary result)   Collection Time: 07/16/15 10:45 AM  Result Value Ref Range Status   Specimen Description BLOOD LEFT FOREARM  Final   Special Requests IN  PEDIATRIC BOTTLE 3ML  Final   Culture   Final    NO GROWTH 3 DAYS Performed at [facility]    Report Status PENDING  Incomplete  Culture, Urine     Status: None (Preliminary result)   Collection Time: 07/18/15  3:52 PM  Result Value Ref Range Status   Specimen Description URINE, CLEAN CATCH  Final   Special Requests NONE  Final   Culture   Final    NO GROWTH < 24 HOURS Performed at [facility]    Report Status PENDING  Incomplete     Labs: Basic Metabolic Panel:  Recent Labs Lab 07/16/15 0929 07/17/15 0539 07/18/15 0509 07/19/15 0515 07/20/15 0547  NA 135 137 136 135 137  K 4.5 4.0 3.9 4.3 3.6  CL 102 105 106 102 101  CO2 26 24 23 24 28   GLUCOSE 108* 84 93 97 109*  BUN 27* 21* 16 13 15   CREATININE 0.89 0.89 0.83 0.88 0.88  CALCIUM 9.7 8.8* 9.0 9.2 9.1   Liver Function Tests:  Recent Labs Lab 07/16/15 0929  AST 20  ALT 27  ALKPHOS 82  BILITOT 0.8  PROT 7.1  ALBUMIN 4.0   No results for input(s): LIPASE, AMYLASE in the last 168 hours. No results for input(s): AMMONIA in the last 168 hours. CBC:  Recent Labs Lab 07/16/15 0929 07/17/15 0539 07/18/15 0509 07/19/15 0515 07/20/15 0547  WBC 10.9* 10.0 11.0* 10.8* 12.0*  NEUTROABS 8.9*  --   --   --   --   HGB 13.4 11.6* 11.3* 11.8* 12.5*  HCT 41.2 35.8* 34.2* 35.9* 38.7*  MCV 89.8 89.7 87.9 87.6 89.0  PLT 346 313 296 277 329   Cardiac Enzymes:  Recent Labs Lab 07/16/15 1242  TROPONINI <0.03   BNP: BNP (last 3 results) No results for input(s): BNP in the last 8760 hours.  ProBNP (last 3 results) No results for input(s): PROBNP in the last 8760 hours.  CBG: No results for input(s): GLUCAP in the last 168 hours.   Signed:  CHIU, Orpah Melter  [facility] 07/20/2015, 9:34 AM

## 2015-07-21 LAB — CULTURE, BLOOD (ROUTINE X 2)
Culture: NO GROWTH
Culture: NO GROWTH

## 2015-07-23 ENCOUNTER — Ambulatory Visit: Payer: Medicare Other | Admitting: Physical Therapy

## 2015-07-24 ENCOUNTER — Telehealth: Payer: Self-pay

## 2015-07-24 NOTE — Telephone Encounter (Signed)
Pt went to [facility] last week for pneumonia and UTI. Pt needs clearance for PT and OT. Pt's wife was wondering if we make that decision or does his PCP?

## 2015-07-24 NOTE — Telephone Encounter (Signed)
I spoke with Mrs Bearman.  He had been in the hospital for 5 days.  He is going to see his PCP Friday and wishes to cancel the Friday appt with Dr Letta Pate. I advised her to follow up with PCP and he can advise about him resuming PT/OT.  If he feels he is up to it and they need more confirmation, they may call us back.

## 2015-07-25 ENCOUNTER — Ambulatory Visit: Payer: Medicare Other | Admitting: Physical Therapy

## 2015-07-26 ENCOUNTER — Ambulatory Visit: Payer: Medicare Other | Admitting: Physical Medicine & Rehabilitation

## 2015-07-26 DIAGNOSIS — R410 Disorientation, unspecified: Secondary | ICD-10-CM | POA: Diagnosis not present

## 2015-07-26 DIAGNOSIS — J189 Pneumonia, unspecified organism: Secondary | ICD-10-CM | POA: Diagnosis not present

## 2015-07-26 DIAGNOSIS — R195 Other fecal abnormalities: Secondary | ICD-10-CM | POA: Diagnosis not present

## 2015-07-30 ENCOUNTER — Telehealth: Payer: Self-pay

## 2015-07-30 NOTE — Telephone Encounter (Signed)
Dr. Roque Lias nurse called today at 1500 to report [name] is now able to resume outpatient PT and OT. [place] office informed and will schedule pt for 2x/week for 2 weeks in order to make up missed appointments, 2/2 hospitalization.

## 2015-08-12 DIAGNOSIS — C61 Malignant neoplasm of prostate: Secondary | ICD-10-CM | POA: Diagnosis not present

## 2015-08-12 DIAGNOSIS — R350 Frequency of micturition: Secondary | ICD-10-CM | POA: Diagnosis not present

## 2015-08-12 DIAGNOSIS — Z0000 Encounter for general adult medical examination without abnormal findings: Secondary | ICD-10-CM | POA: Diagnosis not present

## 2015-08-12 DIAGNOSIS — N289 Disorder of kidney and ureter, unspecified: Secondary | ICD-10-CM | POA: Diagnosis not present

## 2015-08-12 DIAGNOSIS — N320 Bladder-neck obstruction: Secondary | ICD-10-CM | POA: Diagnosis not present

## 2015-08-13 ENCOUNTER — Ambulatory Visit: Payer: Medicare Other | Admitting: Physical Therapy

## 2015-08-13 ENCOUNTER — Ambulatory Visit: Payer: Medicare Other | Admitting: Occupational Therapy

## 2015-08-15 ENCOUNTER — Ambulatory Visit: Payer: Medicare Other

## 2015-08-20 ENCOUNTER — Ambulatory Visit: Payer: Medicare Other

## 2015-08-20 ENCOUNTER — Encounter: Payer: Medicare Other | Admitting: Occupational Therapy

## 2015-08-22 ENCOUNTER — Ambulatory Visit: Payer: Medicare Other

## 2015-08-26 DIAGNOSIS — I639 Cerebral infarction, unspecified: Secondary | ICD-10-CM | POA: Diagnosis not present

## 2015-08-26 DIAGNOSIS — G8111 Spastic hemiplegia affecting right dominant side: Secondary | ICD-10-CM | POA: Diagnosis not present

## 2015-08-26 DIAGNOSIS — N400 Enlarged prostate without lower urinary tract symptoms: Secondary | ICD-10-CM | POA: Diagnosis not present

## 2015-08-26 DIAGNOSIS — Z8546 Personal history of malignant neoplasm of prostate: Secondary | ICD-10-CM | POA: Diagnosis not present

## 2015-08-26 DIAGNOSIS — F0150 Vascular dementia without behavioral disturbance: Secondary | ICD-10-CM | POA: Diagnosis not present

## 2015-08-26 DIAGNOSIS — R1310 Dysphagia, unspecified: Secondary | ICD-10-CM | POA: Diagnosis not present

## 2015-08-26 DIAGNOSIS — F329 Major depressive disorder, single episode, unspecified: Secondary | ICD-10-CM | POA: Diagnosis not present

## 2015-08-29 ENCOUNTER — Encounter: Payer: Medicare Other | Admitting: Occupational Therapy

## 2015-08-30 ENCOUNTER — Encounter: Payer: Medicare Other | Attending: Physical Medicine & Rehabilitation

## 2015-08-30 ENCOUNTER — Ambulatory Visit (HOSPITAL_BASED_OUTPATIENT_CLINIC_OR_DEPARTMENT_OTHER): Payer: Medicare Other | Admitting: Physical Medicine & Rehabilitation

## 2015-08-30 ENCOUNTER — Encounter: Payer: Self-pay | Admitting: Physical Medicine & Rehabilitation

## 2015-08-30 VITALS — BP 130/66 | HR 70

## 2015-08-30 DIAGNOSIS — G8111 Spastic hemiplegia affecting right dominant side: Secondary | ICD-10-CM

## 2015-08-30 DIAGNOSIS — I6350 Cerebral infarction due to unspecified occlusion or stenosis of unspecified cerebral artery: Secondary | ICD-10-CM | POA: Diagnosis not present

## 2015-08-30 NOTE — Patient Instructions (Signed)

## 2015-08-30 NOTE — Progress Notes (Signed)
   Subjective:    Patient ID: [name], male    DOB: 1926-07-25, 82 y.o.   MRN: [id number]  HPI    Review of Systems     Objective:   Physical Exam        Assessment & Plan:  Botox Injection for spasticity using needle EMG guidance  Dilution: 50 Units/ml Indication: Severe spasticity which interferes with ADL,mobility and/or  hygiene and is unresponsive to medication management and other conservative care Informed consent was obtained after describing risks and benefits of the procedure with the patient. This includes bleeding, bruising, infection, excessive weakness, or medication side effects. A REMS form is on file and signed. Needle: 25g 2" needle electrode Number of units per muscle Pectoralis150 Biceps100  FDP50 FCR 50 Pronator50 All injections were done after obtaining appropriate EMG activity and after negative drawback for blood. The patient tolerated the procedure well. Post procedure instructions were given. A followup appointment was made.

## 2015-10-10 ENCOUNTER — Encounter: Payer: Self-pay | Admitting: Physical Medicine & Rehabilitation

## 2015-10-10 ENCOUNTER — Ambulatory Visit (HOSPITAL_BASED_OUTPATIENT_CLINIC_OR_DEPARTMENT_OTHER): Payer: Medicare Other | Admitting: Physical Medicine & Rehabilitation

## 2015-10-10 ENCOUNTER — Encounter: Payer: Medicare Other | Attending: Physical Medicine & Rehabilitation

## 2015-10-10 VITALS — BP 101/52 | HR 70

## 2015-10-10 DIAGNOSIS — I6350 Cerebral infarction due to unspecified occlusion or stenosis of unspecified cerebral artery: Secondary | ICD-10-CM | POA: Diagnosis not present

## 2015-10-10 DIAGNOSIS — G8111 Spastic hemiplegia affecting right dominant side: Secondary | ICD-10-CM

## 2015-10-10 NOTE — Patient Instructions (Signed)
We will repeat Botox in 6 weeks.  Occupational therapy department from [facility] will contact you about an appointment for a new wrist brace

## 2015-10-10 NOTE — Progress Notes (Signed)
Subjective:    Patient ID: [name], male    DOB: 02/19/1926, 82 y.o.   MRN: [id number]  HPI Arm some better with movement after last injection.  Still hurts him some with movement. Botox injection on 08/30/2015. According to his caregiver there has been improvements in shoulder and finger range of motion. No significant improvements with elbow flexor spasticity. This has helped with his hygiene as well as dressing. The patient's wife is wondering about getting a new splint which has been suggested in the past by occupational therapy. Current splint is several months old  No bruising from injection sites.  Pain Inventory Average Pain 0 Pain Right Now 0 My pain is no pain  In the last 24 hours, has pain interfered with the following? General activity 0 Relation with others 0 Enjoyment of life 0 What TIME of day is your pain at its worst? no pain Sleep (in general) NA  Pain is worse with: some activites Pain improves with: no pain Relief from Meds: no pain  Mobility use a wheelchair needs help with transfers  Function disabled: date disabled . retired I need assistance with the following:  feeding, dressing, bathing, toileting, meal prep, household duties and shopping  Neuro/Psych bladder control problems bowel control problems weakness confusion  Prior Studies Any changes since last visit?  no  Physicians involved in your care Any changes since last visit?  no   Family History  Problem Relation Age of Onset  . CAD Father    Social History   Social History  . Marital Status: Married    Spouse Name: Herbert Pun  . Number of Children: 2  . Years of Education: MBA   Social History Main Topics  . Smoking status: Former Research scientist (life sciences)  . Smokeless tobacco: None  . Alcohol Use: No     Comment: Occasionally  . Drug Use: No  . Sexual Activity: Not Asked   Other Topics Concern  . None   Social History Narrative   Live at [employer].   Caffeine: Drinks 2  diet pepsi per week.    Drinks coffee once per week.    Past Surgical History  Procedure Laterality Date  . Appendectomy    . Kidney surgery     Past Medical History  Diagnosis Date  . Cancer ([place])   . Stroke ([place])   . Hearing loss    BP 101/52 mmHg  Pulse 70  SpO2 97%  Opioid Risk Score:   Fall Risk Score:  `1  Depression screen PHQ 2/9  Depression screen [facility] 2/9 10/10/2015 05/21/2015  Decreased Interest 0 0  Down, Depressed, Hopeless 0 0  PHQ - 2 Score 0 0    Review of Systems  All other systems reviewed and are negative.      Objective:   Physical Exam  Constitutional: He appears well-developed and well-nourished.  HENT:  Head: Normocephalic.  Eyes: Conjunctivae and EOM are normal. Pupils are equal, round, and reactive to light.  Nursing note and vitals reviewed.  Patient has Ashworth grade 2 at the Right pectoralisr,4 at the right elbow flexors, 3 at the right finger flexors, 4 at the right wrist flexors 0 active movement in the upper limb Lower limb has 2 minus hip knee extensor synergy 0 at the ankle There is increased tone at the quadriceps as well as the hamstrings and the right lower extremity.       Assessment & Plan:  1. History of left paramedian pontine  infarct onset in December 2015 now with chronic right spastic hemiplegia. He likely has contracture at the right elbow given that Botox has not been helpful nor has phenol neural lysis to the muscular cutaneous nerve. He has had some good relief of his spasticity in the pectoralis region as well as the finger flexors. The patient is at maximum dose of Botox. Because the injection at the biceps was not helpful due to underlying contracture, we will change the Botox dosages for the next injection  Pectoralis150 FCU 50 FDS 50 FDP50 FCR 50 Pronator50

## 2015-10-11 ENCOUNTER — Ambulatory Visit: Payer: Medicare Other | Admitting: Physical Medicine & Rehabilitation

## 2015-10-16 ENCOUNTER — Encounter: Payer: Self-pay | Admitting: Occupational Therapy

## 2015-10-16 NOTE — Therapy (Signed)
[facility] [address] [place], [place], [address] Phone: [phone]   Fax:  [phone]  Patient Details  Name: [name] MRN: [id number] Date of Birth: 02-Nov-1925 Referring Provider:  No ref. provider found  Encounter Date: 10/16/2015  OCCUPATIONAL THERAPY DISCHARGE SUMMARY  Visits from Start of Care: 1  Current functional level related to goals / functional outcomes: Unknown pt did not return following hospitalization.   Remaining deficits: Spastic hemiplegia, cognitive deficits, decreased ROM, decreased balance   Education / Equipment: Pt was only seen for evaluation. Education was not completed.  Plan: Patient agrees to discharge.  Patient goals were not met. Patient is being discharged due to a change in medical status.  ?????     RINE,KATHRYN 10/16/2015, 4:31 PM  [facility] [address] [place], [place], [address] Phone: [phone]   Fax:  [phone]

## 2015-10-17 ENCOUNTER — Encounter: Payer: Self-pay | Admitting: Occupational Therapy

## 2015-10-17 ENCOUNTER — Ambulatory Visit: Payer: Medicare Other | Attending: Neurology | Admitting: Occupational Therapy

## 2015-10-17 DIAGNOSIS — G8111 Spastic hemiplegia affecting right dominant side: Secondary | ICD-10-CM | POA: Insufficient documentation

## 2015-10-17 NOTE — Therapy (Addendum)
[facility] [address] [place], [place], [address] Phone: [phone]   Fax:  [phone]  Occupational Therapy Treatment/Evaluation  Patient Details  Name: [name] MRN: [id number] Date of Birth: 1926/05/01 Referring Provider: Dr. Letta Pate.  Encounter Date: 10/17/2015      OT End of Session - 10/17/15 1200    Visit Number 1   Number of Visits 1   Date for OT Re-Evaluation --  n/A   Authorization Type MEDICARE   OT Start Time 1102   OT Stop Time 1130   OT Time Calculation (min) 28 min   Activity Tolerance Patient tolerated treatment well      Past Medical History  Diagnosis Date  . Cancer ([place])   . Stroke ([place])   . Hearing loss     Past Surgical History  Procedure Laterality Date  . Appendectomy    . Kidney surgery      There were no vitals filed for this visit.  Visit Diagnosis:  Spastic hemiplegia affecting right dominant side [facility])      Subjective Assessment - 10/17/15 1111    Patient is accompained by: Family member  wife and caregiver   Pertinent History L CVA, small vessel disease and atrophy, see epic snapshot   Patient Stated Goals per MD order assess for splint modifications prn   Currently in Pain? No/denies            [facility] OT Assessment - 10/17/15 1140    Assessment   Diagnosis L CVA   Onset Date 06/28/15  phenol injection   Assessment Pt with history of CVA on 06/27/14, s/p multiple botox injections, last one on 08/30/2015. for  RUE f due to spasticity and pain.  Pt returns to occupational therapy for eval to assess any need to update HEP and splint.   Prior Therapy outpatient OT d/c on 07/09/2015   Precautions   Precautions Fall   Restrictions   Weight Bearing Restrictions No   Home  Environment   Family/patient expects to be discharged to: Private residence   Living Arrangements Spouse/significant other   Available Help at Discharge Available 24 hours/day   Home  Layout Two level   [place] Handicapped height   Prior Function   Level of [place] Retired   ADL   Grooming Supervision/safety   Upper Body Bathing Moderate assistance   Lower Body Bathing Maximal assistance   Upper Body Dressing Maximal assistance   Lower Body Dressing Maximal assistance   IADL   Shopping Completely unable to shop   Light Housekeeping Does not participate in any housekeeping tasks   Meal Prep Needs to have meals prepared and served   [employer] on family or friends for transportation   Medication Management Is not capable of dispensing or managing own medication   Financial Management Dependent   Written Expression   Dominant Hand Left   Vision - History   Baseline Vision Wears glasses only for reading   Vision Assessment   Comment Vision appears intact   Cognition   Overall Cognitive Status History of cognitive impairments - at baseline   Sensation   Light Touch Appears Intact   Coordination   Gross Motor Movements are Fluid and Coordinated No   Fine Motor Movements are Fluid and Coordinated No   PROM   Overall PROM  Deficits   Overall PROM Comments severe contractures present at elbow, wrist  Right Shoulder Flexion 50 Degrees   Right Elbow Flexion 120   Right Elbow Extension -60   Right Composite Finger Extension --  90%   Hand Function   Comment Pt has no volitional movement in R hand.                             OT Short Term Goals - Oct 22, 2015 1151    OT SHORT TERM GOAL #1   Title n/a           OT Long Term Goals - 2015/10/22 1151    OT LONG TERM GOAL #1   Title n/a      Tx:  Provided new straps and velcro to splint and provided extras for home use.  Reviewed splint wearing schedule with pt (aide well aware) as well as reinforced importance of wearing splint as well as allowing aide to do stretching program.  Aide is well educated  and does an excellent job of carrying out therapy recommendations as pt will tolerate (pt with severe cognitive and behavioral issues).          Plan - 22-Oct-2015 1152    Clinical Impression Statement Pt is an 82 year old male d/p CVA on 07/16/2014 with resultant severe spastic R hemiplegia and signficant conctracture of RUE.  Pt has received 2 botox injections as well as phenol injection and has had OT therapy in this clinic prior to today's visit  (see prior notes).  Pt has excellent and consistent aide support at home who have been well instructed in stretching program and report today that they carry this program out at home 2 times per day as pt  tolerates - aide reports that overall pt is fairly compliant with program. Pt also has custom splint for RUE which aide reports pt prefers to wear during the day and not at night.  This was pt's preference before and aide is knowledgeable about wearing schedule. She reports pt at times refuses to wear splint according to schedule and then other times is willing to wear splint (pt with severe cognitive and behavioral deficits).  Reaassessed splint today and splint still with excellent fit (did replace straps and velcro and provided with wtih extra straps and velcro to take home).  Do not recommend any changes to splint or home program at this time therefore do not recommned any additional OT at this time.    Pt will benefit from skilled therapeutic intervention in order to improve on the following deficits (Retired) --  n/a   Rehab Potential --  n/a   OT Frequency --  n/a   OT Treatment/Interventions --  n/a   Plan no further skilled OT indicated at this time.   Consulted and Agree with Plan of Care Patient;Family member/caregiver   Family Member Consulted wife, pca          G-Codes - 10-22-15 October 31, 1200    Functional Assessment Tool Used skilled clinical assessment   Functional Limitation Other OT subsequent   Other OT Primary Current Status [phone])  --  care of RUE by caregivers   Other OT Secondary Current Status [id number]) At least 1 percent but less than 20 percent impaired, limited or restricted   Other OT Secondary Goal Status [id number]) At least 1 percent but less than 20 percent impaired, limited or restricted   Other OT Secondary Discharge Status [id number]) At least 1 percent but less than 20 percent impaired, limited or  restricted      Problem List Patient Active Problem List   Diagnosis Date Noted  . Spastic hemiplegia affecting right dominant side ([place]) 05/31/2015  . Sepsis ([place]) 10/08/2014  . Acute encephalopathy 10/08/2014  . UTI (lower urinary tract infection) 10/08/2014  . Elevated troponin 10/08/2014  . Right hemiparesis ([place]) 07/05/2014  . Dysphagia, post-stroke 07/05/2014  . Dysarthria due to recent cerebral infarction 07/05/2014  . Left pontine stroke ([place]) 07/02/2014  . Embolic stroke involving left carotid artery ([place]) 06/28/2014  . Stroke ([place]) 06/28/2014  . CAP (community acquired pneumonia) 06/28/2014  . Normocytic anemia 06/28/2014  . Essential hypertension 06/28/2014  . Hypokalemia 06/28/2014    Quay Burow, OTR/L 10/17/2015, 12:05 PM  [facility] [address] [place], [place], [address] Phone: [phone]   Fax:  [phone]  Name: GEORGIO GO MRN: [id number] Date of Birth: [birth year]

## 2015-11-04 DIAGNOSIS — N320 Bladder-neck obstruction: Secondary | ICD-10-CM | POA: Diagnosis not present

## 2015-11-04 DIAGNOSIS — Z0000 Encounter for general adult medical examination without abnormal findings: Secondary | ICD-10-CM | POA: Diagnosis not present

## 2015-11-04 DIAGNOSIS — N289 Disorder of kidney and ureter, unspecified: Secondary | ICD-10-CM | POA: Diagnosis not present

## 2015-11-04 DIAGNOSIS — Z8673 Personal history of transient ischemic attack (TIA), and cerebral infarction without residual deficits: Secondary | ICD-10-CM | POA: Diagnosis not present

## 2015-11-04 DIAGNOSIS — C61 Malignant neoplasm of prostate: Secondary | ICD-10-CM | POA: Diagnosis not present

## 2015-11-19 ENCOUNTER — Encounter: Payer: Self-pay | Admitting: Neurology

## 2015-11-19 ENCOUNTER — Ambulatory Visit (INDEPENDENT_AMBULATORY_CARE_PROVIDER_SITE_OTHER): Payer: Medicare Other | Admitting: Neurology

## 2015-11-19 VITALS — BP 123/58 | HR 75 | Ht 69.0 in

## 2015-11-19 DIAGNOSIS — G8110 Spastic hemiplegia affecting unspecified side: Secondary | ICD-10-CM | POA: Diagnosis not present

## 2015-11-19 NOTE — Progress Notes (Signed)
[facility]    Provider:  Dr Jaynee Eagles Referring Provider: Gaynelle Arabian, MD Primary Care Physician:  Simona Huh, MD  CC:  Stroke and confusion  HPI:  [name] is a 82 y.o. male here as a referral from Dr. Marisue Humble for stroke follow up Update 11/09/2014 : He returns for follow-up after last visit 1 month ago. He was hospitalized for a few days at [facility] and found to have some dehydration as well as urinary tract infection. MRI scan of the brain was obtained which are personally reviewed showed no acute infarct but old lacunar infarcts as well as left pontine infarct. He has made some slow improvement and his confusion and encephalopathy have resolved. He is able to walk with a hemiwalker with [facility] physical therapist. He has a right foot drop but can walk with her AFO. He has regained some strength in his hand but still has significant weakness. He plans to go home next week and home health has been set up. Family is requesting a prescription for portable wheelchair ramp. The patient, wife and daughter had lots of questions about his care and future risk for strokes and answered these. Last visit  10/10/2014 ( Dr Jaynee Eagles ) [name] is a 82 y.o. left handed male with history of HTN, prostate cancer, recent PNA with malaise X 1 week who was admitted on 06/27/14 with right sided weakness with right gaze preference and difficulty talking.  Patient was noted to be in A fib in setting of CAP. 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 Neurology followed for input and recommended ASA for thrombotic infarct due to small vessel disease.MBS revealed dysphagia and he was started on dysphagia 1 diet with nectar liquids but continues to have coughing episodes due to difficulty handling secretions  He was doing well, but over the weekend noticed a change in walking. Since then it has not been the  same, about 5 weeks ago. He is walking less. He is more confused. He has a dullness. He is at [facility] and he is not having mental stimulation. He has no social interaction. Since January he is having a cognitive decline. [place] and his wife provide the information. He didn't remember seeing his daughter. He is walking some but he can't go up 4 steps. He used to read the newspapers every day, he has lost interest. This has been going on for longer than 4 weeks. He was having difficulty swallowing, he is on a soft diet at the facility. The right arm is still weak, the leg has improved. He is on an SSRI for depression.  Update 05/21/2015 : He returns for follow-up after last visit 6 months ago. He recommended by his wife. They noticed that the patient has actually had decline in his ambulation after [facility] and coming home and getting home physical therapy. They currently getting outpatient therapy which seems to have helped him more than home therapy did. Patient does have spasticity and pain which prevents passive range of motion of the right wrist and hand. He walks quite limited mainly with a hemiwalker holding onto physical therapist. Patient states that he clearly wants to walk and is willing to work hard but tightness in his legs and hands may be limiting his mobility. He has not seen Dr. Dianna Limbo for outpatient follow-up and I will set that up. The patient was started on Lexapro for depression but  it made him quite sleepy and after it was discontinued he is much more alert. He is currently on Wellbutrin and tolerating it well. He does home exercises twice a day and he does have a 24-hour caregiver at home who also walks him Update 11/19/2015 : He returns for follow-up after last visit 6 months ago. Accompanied by his wife. Patient has seen a geriatric specialist in [place] and has been started on Exelon. The dose has been titrated up to 9.5 mg/24-hour. The wife has  noticed improvement in his confusion and more mental clarity. It seems to be more interactive. He did have some mild nausea and vomiting initially but now is tolerating it well without side effects. The patient remains on Plavix for stroke prevention and is tolerating it well without bleeding or bruising. His dose of Lipitor has been reduced to half. His Macrodantin also has been reduced now to every other day 3 days a week. Patient remains nonambulatory and mostly in a wheelchair. He does have 24-hour caregiver at home who works with him. He has seen Dr. Barbaraann Cao and has been getting Botox in his right arm and has noticed some improvement in range of motion no improved strength. He has not had any recurrent stroke or TIA symptoms.     Review of Systems: Patient complains of symptoms per HPI as well as the following symptoms:  Weakness, pain, spasticity, gait difficulty, memory loss,    Pertinent negatives per HPI. All others negative.   Social History   Social History  . Marital Status: Married    Spouse Name: [name]  . Number of Children: 2  . Years of Education: MBA   Occupational History  . Not on file.   Social History Main Topics  . Smoking status: Former Research scientist (life sciences)  . Smokeless tobacco: Not on file  . Alcohol Use: No     Comment: Occasionally  . Drug Use: No  . Sexual Activity: Not on file   Other Topics Concern  . Not on file   Social History Narrative   Live at [employer].   Caffeine: Drinks 2 diet pepsi per week.    Drinks coffee once per week.     Family History  Problem Relation Age of Onset  . CAD Father     Past Medical History  Diagnosis Date  . Cancer ([place])   . Stroke ([place])   . Hearing loss     Past Surgical History  Procedure Laterality Date  . Appendectomy    . Kidney surgery      Current Outpatient Prescriptions  Medication Sig Dispense Refill  . atorvastatin (LIPITOR) 20 MG tablet Take 1 tablet (20 mg total) by mouth daily at 6 PM.  (Patient taking differently: Take 20 mg by mouth daily at 6 PM. TAkes 1/2 tablet)    . buPROPion (WELLBUTRIN) 100 MG tablet Take 100 mg by mouth every morning. Pt not sure of dosage    . clopidogrel (PLAVIX) 75 MG tablet Take 1 tablet (75 mg total) by mouth daily. 30 tablet 11  . nitrofurantoin (MACRODANTIN) 50 MG capsule Take 50 mg by mouth. Takes mon,wed and friday    . rivastigmine (EXELON) 9.5 mg/24hr Place onto the skin.    . tamsulosin (FLOMAX) 0.4 MG CAPS capsule Take 0.4 mg by mouth at bedtime.      No current facility-administered medications for this visit.    Allergies as of 11/19/2015  . (No Known Allergies)    Vitals: BP 123/58  mmHg  Pulse 75  Ht 5\' 9"  (1.753 m)  Wt  Last Weight:  Wt Readings from Last 1 Encounters:  07/19/15 133 lb 2.5 oz (60.4 kg)   Last Height:   Ht Readings from Last 1 Encounters:  11/19/15 5\' 9"  (1.753 m)    Physical exam: Exam: Gen: NAD, sitting in electric wheelchair     Frail elderly caucasian male               CV:  No murmur or gallop . No Carotid Bruits. No peripheral edema, warm, nontender Eyes: Conjunctivae clear without exudates or hemorrhage  Neuro:  Awake alert oriented 2. Diminished attention, registration and recall. Mini-Mental status exam not done  Speech:    Minimal dysarthria Cognition:      The patient is oriented to person, follows one-step commands    recent and remote memory impaired;     Impaired attention, concentration,     fund of knowledge Cranial Nerves:    The pupils are equal, round, and reactive to light. Attempted to visualize fundi but pupils small. Visual fields are full to threat. No gaze preference, extraocular movements are full. Trigeminal sensation is intact and the muscles of mastication are normal.right facial droop.  The palate elevates in the midline. Hearing impaired. Voice is normal. Shoulder shrug is normal. The tongue has normal motion without fasciculations.   Coordination:    Attempted,  cannot perform due to weakness  Gait:    Attempted, cannot perform due to weakness  Motor Observation:    no involuntary movements noted. Tone:    Right spasticity with increased tone  Posture:    Posture is erect in wheelchair    Strength: Spatic right hemiplegia 2/5 strength RUE with significant grip weakness and non-fixed flexion deformity at the right wrist and fingers and 3/5 RLE with right foot drop and  ankle DF 0/5.  right ankle brace Good strength on left side.  Sensation: right hemi-sensory loss     Reflex Exam:  DTR's: spastic on the right     Toes:    upgoing left Clonus:    Clonus is absent. Gait deferred as patient is in wheelchair most of the time     ASSESSMENT/PLAN [name] is a 82 y.o. left handed male with history of HTN, prostate cancer,  With nonhemorrhagic left paramedian pontine infarct in Dec 2015.   Patient with subsequent residual significant  Spastic right hemiparesis with recent diagnosis of mild dementia improved on exelon PLAN :   I had a long d/w patient and his wife about his remote stroke, post stroke cognitive impairment, spasticity, risk for recurrent stroke/TIAs, personally independently reviewed imaging studies and stroke evaluation results and answered questions.Continue Plavix for secondary stroke prevention and maintain strict control of  lipids with LDL cholesterol goal below 70 mg/dL. I also advised the patient to eat a healthy diet with plenty of whole grains, cereals, fruits and vegetables, exercise regularly and maintain ideal body weight. Continue Botox in the right upper extremity by Dr. Kayren Eaves for preserving range of motion and preventing contractures. Continue follow-up with his geriatrician in [place] for treatment for cognitive impairment. Greater than 50% time during this 30 minute visit was spent on counseling and coordination of care about his disability from his stroke, spasticity, cognitive impairment. Followup in  the future with me in the future only as necessary and no routine appointment was made   Antony Contras, MD  [facility] [place]  [place], [place] [address]  Phone [phone] Fax [phone]

## 2015-11-19 NOTE — Patient Instructions (Signed)
I had a long d/w patient and his wife about his remote stroke, post stroke cognitive impairment, spasticity, risk for recurrent stroke/TIAs, personally independently reviewed imaging studies and stroke evaluation results and answered questions.Continue Plavix for secondary stroke prevention and maintain strict control of  lipids with LDL cholesterol goal below 70 mg/dL. I also advised the patient to eat a healthy diet with plenty of whole grains, cereals, fruits and vegetables, exercise regularly and maintain ideal body weight. Continue Botox in the right upper extremity by Dr. Kayren Eaves for preserving range of motion and preventing contractures. Continue follow-up with his geriatrician in [place] for treatment for cognitive impairment. Followup in the future with me in the future only as necessary and no routine appointment was made

## 2015-11-21 ENCOUNTER — Encounter: Payer: Medicare Other | Attending: Physical Medicine & Rehabilitation

## 2015-11-21 ENCOUNTER — Ambulatory Visit (HOSPITAL_BASED_OUTPATIENT_CLINIC_OR_DEPARTMENT_OTHER): Payer: Medicare Other | Admitting: Physical Medicine & Rehabilitation

## 2015-11-21 ENCOUNTER — Encounter: Payer: Self-pay | Admitting: Physical Medicine & Rehabilitation

## 2015-11-21 VITALS — BP 113/58 | HR 77

## 2015-11-21 DIAGNOSIS — I6350 Cerebral infarction due to unspecified occlusion or stenosis of unspecified cerebral artery: Secondary | ICD-10-CM | POA: Insufficient documentation

## 2015-11-21 DIAGNOSIS — G8111 Spastic hemiplegia affecting right dominant side: Secondary | ICD-10-CM | POA: Diagnosis not present

## 2015-11-21 NOTE — Patient Instructions (Addendum)

## 2015-11-21 NOTE — Progress Notes (Signed)
Botox Injection for spasticity using needle EMG guidance  Dilution: 50 Units/ml Indication: Severe spasticity which interferes with ADL,mobility and/or  hygiene and is unresponsive to medication management and other conservative care Informed consent was obtained after describing risks and benefits of the procedure with the patient. This includes bleeding, bruising, infection, excessive weakness, or medication side effects. A REMS form is on file and signed. Needle: 27g 1" needle electrode Number of units per muscle Pectoralis150,(25U x6) FCU 50 FDS 50 FDP50 FCR 50 Pronator50 All injections were done after obtaining appropriate EMG activity and after negative drawback for blood. EMG activity was mixture between voluntary motor unit action potential and spontaneous activity.The patient tolerated the procedure well. Post procedure instructions were given. A followup appointment was made. 6 weeks

## 2015-11-25 DIAGNOSIS — E785 Hyperlipidemia, unspecified: Secondary | ICD-10-CM | POA: Diagnosis not present

## 2015-11-25 DIAGNOSIS — G8111 Spastic hemiplegia affecting right dominant side: Secondary | ICD-10-CM | POA: Diagnosis not present

## 2015-11-25 DIAGNOSIS — R1310 Dysphagia, unspecified: Secondary | ICD-10-CM | POA: Diagnosis not present

## 2015-11-25 DIAGNOSIS — I639 Cerebral infarction, unspecified: Secondary | ICD-10-CM | POA: Diagnosis not present

## 2015-11-25 DIAGNOSIS — N400 Enlarged prostate without lower urinary tract symptoms: Secondary | ICD-10-CM | POA: Diagnosis not present

## 2015-11-25 DIAGNOSIS — Z79899 Other long term (current) drug therapy: Secondary | ICD-10-CM | POA: Diagnosis not present

## 2015-11-25 DIAGNOSIS — R2681 Unsteadiness on feet: Secondary | ICD-10-CM | POA: Diagnosis not present

## 2015-11-25 DIAGNOSIS — F329 Major depressive disorder, single episode, unspecified: Secondary | ICD-10-CM | POA: Diagnosis not present

## 2015-11-25 DIAGNOSIS — F0150 Vascular dementia without behavioral disturbance: Secondary | ICD-10-CM | POA: Diagnosis not present

## 2015-12-02 NOTE — Addendum Note (Signed)
Addended by: Rivka Barbara on: 12/02/2015 03:31 PM   Modules accepted: Orders

## 2015-12-27 ENCOUNTER — Encounter: Payer: Self-pay | Admitting: Physical Medicine & Rehabilitation

## 2015-12-27 ENCOUNTER — Encounter: Payer: Medicare Other | Attending: Physical Medicine & Rehabilitation

## 2015-12-27 ENCOUNTER — Ambulatory Visit (HOSPITAL_BASED_OUTPATIENT_CLINIC_OR_DEPARTMENT_OTHER): Payer: Medicare Other | Admitting: Physical Medicine & Rehabilitation

## 2015-12-27 VITALS — BP 108/56 | HR 78 | Resp 14

## 2015-12-27 DIAGNOSIS — G8111 Spastic hemiplegia affecting right dominant side: Secondary | ICD-10-CM | POA: Insufficient documentation

## 2015-12-27 DIAGNOSIS — I6350 Cerebral infarction due to unspecified occlusion or stenosis of unspecified cerebral artery: Secondary | ICD-10-CM | POA: Insufficient documentation

## 2015-12-27 DIAGNOSIS — M24541 Contracture, right hand: Secondary | ICD-10-CM | POA: Diagnosis not present

## 2015-12-27 DIAGNOSIS — M24549 Contracture, unspecified hand: Secondary | ICD-10-CM | POA: Insufficient documentation

## 2015-12-27 NOTE — Patient Instructions (Signed)
No driving  Will send an order to occupational therapy

## 2015-12-27 NOTE — Progress Notes (Signed)
Subjective:    Patient ID: [name], male    DOB: 1925/12/07, 82 y.o.   MRN: [id number] 11/21/2015 Pectoralis150,(25U x6) FCU 50 FDS 50 FDP50 FCR 50 Pronator50  HPI Patient's caregiver's note decreased pain with range of motion. This is in the right upper extremity. Hand hygiene as well as fingernail hygiene improved Patient had no adverse effect from botulinum toxin injection. Wife is asking whether the wrist orthosis may be adjusted.  Pain Inventory Average Pain 0 Pain Right Now 0 My pain is no pain  In the last 24 hours, has pain interfered with the following? General activity 0 Relation with others 0 Enjoyment of life 0 What TIME of day is your pain at its worst? no pain Sleep (in general) NA  Pain is worse with: some activites Pain improves with: no pain Relief from Meds: no pain  Mobility use a wheelchair needs help with transfers  Function disabled: date disabled . retired I need assistance with the following:  feeding, dressing, bathing, toileting, meal prep, household duties and shopping  Neuro/Psych bladder control problems bowel control problems weakness confusion  Prior Studies Any changes since last visit?  no  Physicians involved in your care Any changes since last visit?  no   Family History  Problem Relation Age of Onset  . CAD Father    Social History   Social History  . Marital Status: Married    Spouse Name: Herbert Pun  . Number of Children: 2  . Years of Education: MBA   Social History Main Topics  . Smoking status: Former Research scientist (life sciences)  . Smokeless tobacco: None  . Alcohol Use: No     Comment: Occasionally  . Drug Use: No  . Sexual Activity: Not Asked   Other Topics Concern  . None   Social History Narrative   Live at [employer].   Caffeine: Drinks 2 diet pepsi per week.    Drinks coffee once per week.    Past Surgical History  Procedure Laterality Date  . Appendectomy    . Kidney surgery     Past Medical  History  Diagnosis Date  . Cancer ([place])   . Stroke ([place])   . Hearing loss    BP 108/56 mmHg  Pulse 78  Resp 14  SpO2 98%  Opioid Risk Score:   Fall Risk Score:  `1  Depression screen PHQ 2/9  Depression screen [facility] 2/9 11/21/2015 10/10/2015 05/21/2015  Decreased Interest 0 0 0  Down, Depressed, Hopeless 0 0 0  PHQ - 2 Score 0 0 0    Review of Systems  All other systems reviewed and are negative.      Objective:   Physical Exam  Constitutional: He appears well-developed and well-nourished.  HENT:  Head: Normocephalic.  Eyes: Conjunctivae and EOM are normal. Pupils are equal, round, and reactive to light.  Nursing note and vitals reviewed.  Patient has Ashworth grade 2 at the Right pectoralisr,4 at the right elbow flexors, 3 at the right finger flexors, 4 at the right wrist flexors  No pain with range of motion in the right upper limb at the shoulder elbow or wrist. His fingers can be extended now to provide hygiene to the hand as well as cut fingernails  0 active movement in the upper limb Lower limb has 2 minus hip knee extensor synergy 0 at the ankle There is increased tone at the quadriceps as well as the hamstrings and the right lower extremity.  Assessment & Plan:  1. Spastic hemiplegia with some range of motion improvements related to Botox injection at the finger and wrist flexors as well as the pectoralis muscles. He does have a custom molded thermoplastic orthosis for the right wrist and hand. I do think he has gained adequate range to allow some increased extension. We will refer to occupational  therapy for this.  Will try injecting Dysport 1000 units next visit with goal of increasing duration of response. Recommend FCU 100 units FDS 100 units FDP 100 units FCR 100 units  Biceps 100 units Pectoralis 500 units  Discussed with patient and wife rationale between selecting Botox versus Dysport.  Also answered questions, patient wanting to drive.  He is not capable of this and has poor awareness of his deficits. Wife and caregiver agree that he is not realistic in this regard.  We also discussed prognosis for further recovery which is poor given the duration since onset of stroke approximately 18 months ago  Over half of the 25 min visit was spent counseling and coordinating care.

## 2016-01-10 DIAGNOSIS — Z85828 Personal history of other malignant neoplasm of skin: Secondary | ICD-10-CM | POA: Diagnosis not present

## 2016-01-10 DIAGNOSIS — L570 Actinic keratosis: Secondary | ICD-10-CM | POA: Diagnosis not present

## 2016-01-10 DIAGNOSIS — L821 Other seborrheic keratosis: Secondary | ICD-10-CM | POA: Diagnosis not present

## 2016-01-14 ENCOUNTER — Ambulatory Visit: Payer: Medicare Other | Attending: Neurology | Admitting: Occupational Therapy

## 2016-01-14 ENCOUNTER — Encounter: Payer: Self-pay | Admitting: Occupational Therapy

## 2016-01-14 DIAGNOSIS — G8113 Spastic hemiplegia affecting right nondominant side: Secondary | ICD-10-CM

## 2016-01-14 NOTE — Therapy (Signed)
[facility] [address] [place], [place], [address] Phone: [phone]   Fax:  [phone]  Occupational Therapy Evaluation  Patient Details  Name: [name] MRN: [id number] Date of Birth: [birth year] Referring Provider: Dr. Letta Pate.  Encounter Date: 01/14/2016      OT End of Session - 01/14/16 1256    Visit Number 1   Number of Visits 1   Date for OT Re-Evaluation --  n/a   Authorization Type MEDICARE   OT Start Time [phone]   OT Stop Time 0955   OT Time Calculation (min) 24 min   Activity Tolerance Patient tolerated treatment well      Past Medical History  Diagnosis Date  . Cancer ([place])   . Stroke ([place])   . Hearing loss     Past Surgical History  Procedure Laterality Date  . Appendectomy    . Kidney surgery      There were no vitals filed for this visit.      Subjective Assessment - 01/14/16 0935    Subjective  He has been on a patch since January and his mental state is much better.   Patient is accompained by: Family member  wife and CNA   Pertinent History L CVA, small vessel disease and atrophy, see epic snapshot   Patient Stated Goals per MD order assess for splint modifications prn   Currently in Pain? No/denies           [facility] OT Assessment - 01/14/16 0001    Assessment   Diagnosis L CVA   Onset Date 06/28/15   Assessment Pt with h/o of CVA on 06/27/14, s/p multiple botox injections last one on 11/21/2015 for RUE due to spasticity and pain.  Pt returns to OT for eval to assess any need to update splint   Prior Therapy outpatient OT  d/c on 10/17/2015   Precautions   Precautions Fall   Restrictions   Weight Bearing Restrictions No   Balance Screen   Has the patient fallen in the past 6 months No   Home  Environment   Family/patient expects to be discharged to: Private residence   Living Arrangements Spouse/significant other   Available Help at Discharge Available 24 hours/day    Home Layout Two level   Bathroom Shower/Tub Occupational psychologist Handicapped height   Prior Function   Level of [place] Retired   ADL   Eating/Feeding Minimal assistance   Grooming Supervision/safety   Upper Body Bathing Moderate assistance   Lower Body Bathing Maximal assistance   Upper Body Dressing Maximal assistance   Lower Body Dressing Maximal assistance   Toileting - Clothing Manipulation + 1 Total assistance   Where Assessed - Camera operator Manipulationn + 1 Total assistance   Tub/Shower Transfer + 1 Total assistance   IADL   Shopping Completely unable to shop   Light Housekeeping Does not participate in any housekeeping tasks   Meal Prep Needs to have meals prepared and served   [employer] on family or friends for transportation   Medication Management Is not capable of dispensing or managing own medication   Financial Management Dependent   Written Expression   Dominant Hand Left   Vision - History   Baseline Vision Wears glasses all the time   Vision Assessment   Comment Vision appears intact   Cognition   Overall Cognitive Status History of cognitive impairments - at baseline  Sensation   Light Touch Appears Intact   Hot/Cold Appears Intact   Proprioception Appears Intact   Coordination   Gross Motor Movements are Fluid and Coordinated No   Fine Motor Movements are Fluid and Coordinated No   Other Pt unable to do any standardized testing   Tone   Assessment Location Right Upper Extremity   ROM / Strength   AROM / PROM / Strength AROM;Strength   AROM   Overall AROM  Deficits   Overall AROM Comments Pt has no active movement in RUE   PROM   Overall PROM  Deficits   Overall PROM Comments severe contracture present at elbow and wrist. R shoulder flexion 50* elbow extension -60* Pt has gained approximately 2* of passive wrist extension since last OT eval.  Pt has improved in passive finger extension  and now able to range fingers to 3/4 range of extension (with wrist flexion) without pain.  Pt's hand immediately fists however with any increase in wrist extension.    Right Shoulder Flexion 50 Degrees   Right Elbow Flexion 120   Right Elbow Extension -60   Strength   Overall Strength Deficits   Overall Strength Comments unable to assess due to severe spasticity and contracture   Hand Function   Comment Pt has no volitional movement in R hand   RUE Tone   RUE Tone Severe;Hypertonic                           OT Short Term Goals - 01/14/16 1249    OT SHORT TERM GOAL #1   Title n/a           OT Long Term Goals - 01/14/16 1249    OT LONG TERM GOAL #1   Title n/a               Plan - 01/14/16 1249    Clinical Impression Statement Pt is an 82 year old male s/p L CVA on 07/16/14 with resultant severe spastic hemiplegia and significant contracture of RUE.  Pt has received 3 boton injections as well as phenol injection and has had OT therapy in this clinic prior to today's visit (see prior notes). Pt has excellent and consistent aide support at home who habe been well instructed in stretching program. Aide reports this is being done 2x/ daily and pt is tolerating well.  Pt also has custom splint for R hand and is tolerating this well.  Unable at this time to adjust the splint into any more wrist extension as pt then losed finger extension which could compromise skin integrity.  Pt tolearating exteension of fingers in current splint but would not tolerate any additional extension from a comfort and skin standpoint.  Replaced straps and velcro on splints and cleaned splint today. Also provided family with additonal straps to take home  Do not recommend any changes to splint or home program at this time therefore do not recommned any additional OT at this time.  PT and wife in agreement.    Rehab Potential --  n/a   Clinical Impairments Affecting Rehab Potential  cognition, severity of defiicits   OT Frequency --  n/a   OT Treatment/Interventions --  n/a   Plan no further skilled OT indicated at this time.   Consulted and Agree with Plan of Care Patient;Family member/caregiver   Family Member Consulted wife, pca      Patient will benefit from skilled therapeutic  intervention in order to improve the following deficits and impairments:   (n/a)  Visit Diagnosis: Spastic hemiplegia affecting right nondominant side [facility])    Problem List Patient Active Problem List   Diagnosis Date Noted  . Flexion contracture of joint of hand 12/27/2015  . Spastic hemiplegia affecting right dominant side ([place]) 05/31/2015  . Sepsis ([place]) 10/08/2014  . Acute encephalopathy 10/08/2014  . UTI (lower urinary tract infection) 10/08/2014  . Elevated troponin 10/08/2014  . Right hemiparesis ([place]) 07/05/2014  . Dysphagia, post-stroke 07/05/2014  . Dysarthria due to recent cerebral infarction 07/05/2014  . Left pontine stroke ([place]) 07/02/2014  . Embolic stroke involving left carotid artery ([place]) 06/28/2014  . Stroke ([place]) 06/28/2014  . CAP (community acquired pneumonia) 06/28/2014  . Normocytic anemia 06/28/2014  . Essential hypertension 06/28/2014  . Hypokalemia 06/28/2014    Quay Burow, OTR/L 01/14/2016, 12:57 PM  [facility] [address] [place], [place], [address] Phone: [phone]   Fax:  [phone]  Name: ADANTE HUSSER MRN: [id number] Date of Birth: February 06, 1926

## 2016-02-07 ENCOUNTER — Ambulatory Visit: Payer: Medicare Other | Admitting: Physical Medicine & Rehabilitation

## 2016-02-10 DIAGNOSIS — Z85828 Personal history of other malignant neoplasm of skin: Secondary | ICD-10-CM | POA: Diagnosis not present

## 2016-02-10 DIAGNOSIS — L570 Actinic keratosis: Secondary | ICD-10-CM | POA: Diagnosis not present

## 2016-02-10 DIAGNOSIS — L308 Other specified dermatitis: Secondary | ICD-10-CM | POA: Diagnosis not present

## 2016-02-14 DIAGNOSIS — F322 Major depressive disorder, single episode, severe without psychotic features: Secondary | ICD-10-CM | POA: Diagnosis not present

## 2016-02-14 DIAGNOSIS — E559 Vitamin D deficiency, unspecified: Secondary | ICD-10-CM | POA: Diagnosis not present

## 2016-02-14 DIAGNOSIS — I700 Atherosclerosis of aorta: Secondary | ICD-10-CM | POA: Diagnosis not present

## 2016-02-14 DIAGNOSIS — I10 Essential (primary) hypertension: Secondary | ICD-10-CM | POA: Diagnosis not present

## 2016-02-14 DIAGNOSIS — E785 Hyperlipidemia, unspecified: Secondary | ICD-10-CM | POA: Diagnosis not present

## 2016-02-14 DIAGNOSIS — I6930 Unspecified sequelae of cerebral infarction: Secondary | ICD-10-CM | POA: Diagnosis not present

## 2016-02-14 DIAGNOSIS — G8191 Hemiplegia, unspecified affecting right dominant side: Secondary | ICD-10-CM | POA: Diagnosis not present

## 2016-02-14 DIAGNOSIS — R471 Dysarthria and anarthria: Secondary | ICD-10-CM | POA: Diagnosis not present

## 2016-02-21 ENCOUNTER — Encounter: Payer: Medicare Other | Attending: Physical Medicine & Rehabilitation

## 2016-02-21 ENCOUNTER — Ambulatory Visit (HOSPITAL_BASED_OUTPATIENT_CLINIC_OR_DEPARTMENT_OTHER): Payer: Medicare Other | Admitting: Physical Medicine & Rehabilitation

## 2016-02-21 ENCOUNTER — Encounter: Payer: Self-pay | Admitting: Physical Medicine & Rehabilitation

## 2016-02-21 VITALS — BP 119/69 | HR 72 | Resp 17

## 2016-02-21 DIAGNOSIS — G8111 Spastic hemiplegia affecting right dominant side: Secondary | ICD-10-CM | POA: Insufficient documentation

## 2016-02-21 DIAGNOSIS — I6350 Cerebral infarction due to unspecified occlusion or stenosis of unspecified cerebral artery: Secondary | ICD-10-CM | POA: Insufficient documentation

## 2016-02-21 NOTE — Progress Notes (Signed)
Dysport Injection for spasticity using needle EMG guidance  Dilution: 200 Units/ml Indication: Severe spasticity which interferes with ADL,mobility and/or  hygiene and is unresponsive to medication management and other conservative care Informed consent was obtained after describing risks and benefits of the procedure with the patient. This includes bleeding, bruising, infection, excessive weakness, or medication side effects. A REMS form is on file and signed. Needle: 37 mm 27-gauge needle electrode Number of units per muscle FCU 100 units FDS 100 units FDP 100 units FCR 100 units  Biceps 100 units Pectoralis 500 units All injections were done after obtaining appropriate EMG activity and after negative drawback for blood. The patient tolerated the procedure well. Post procedure instructions were given. A followup appointment was made.

## 2016-03-09 IMAGING — CR DG THORACIC SPINE 2V
3 series · 3 of 3 positions shown · non-contrast
Comparison: None.

CLINICAL DATA: 88-year-old male with a history of lower thoracic
back pain

EXAM:
THORACIC SPINE - 2 VIEW

[t thoracic spine ap]
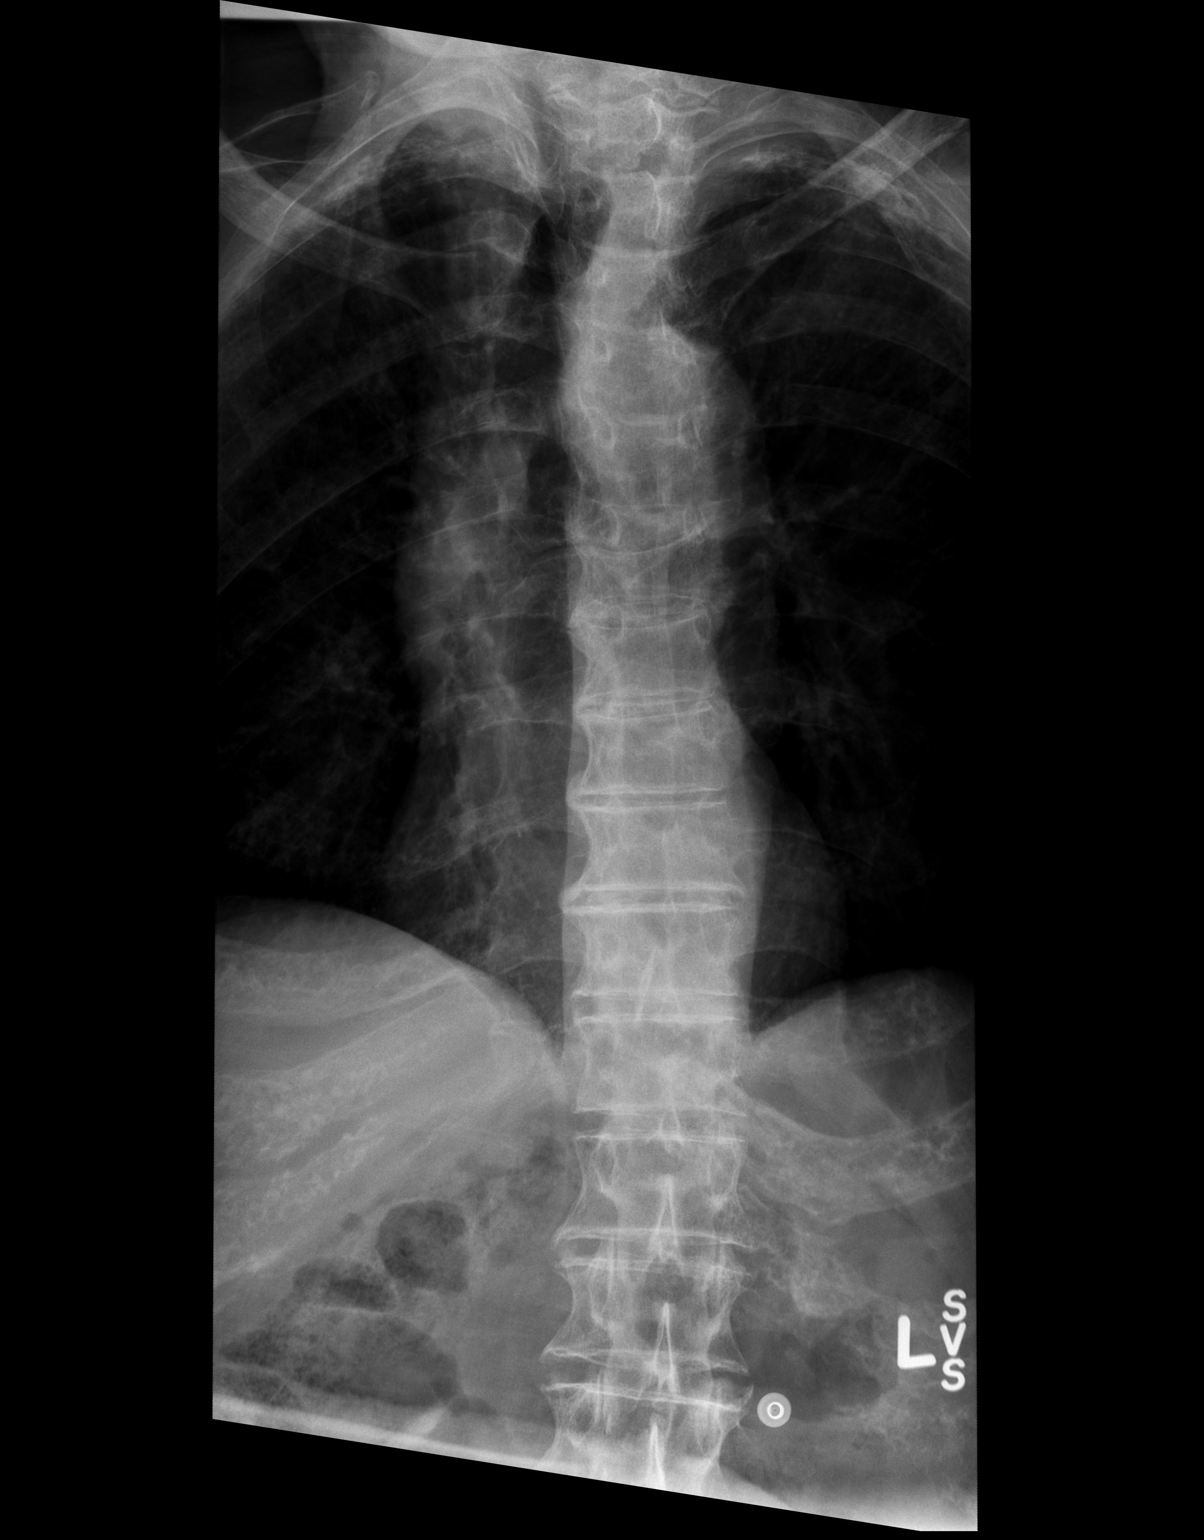

[t thoracic spine lat]
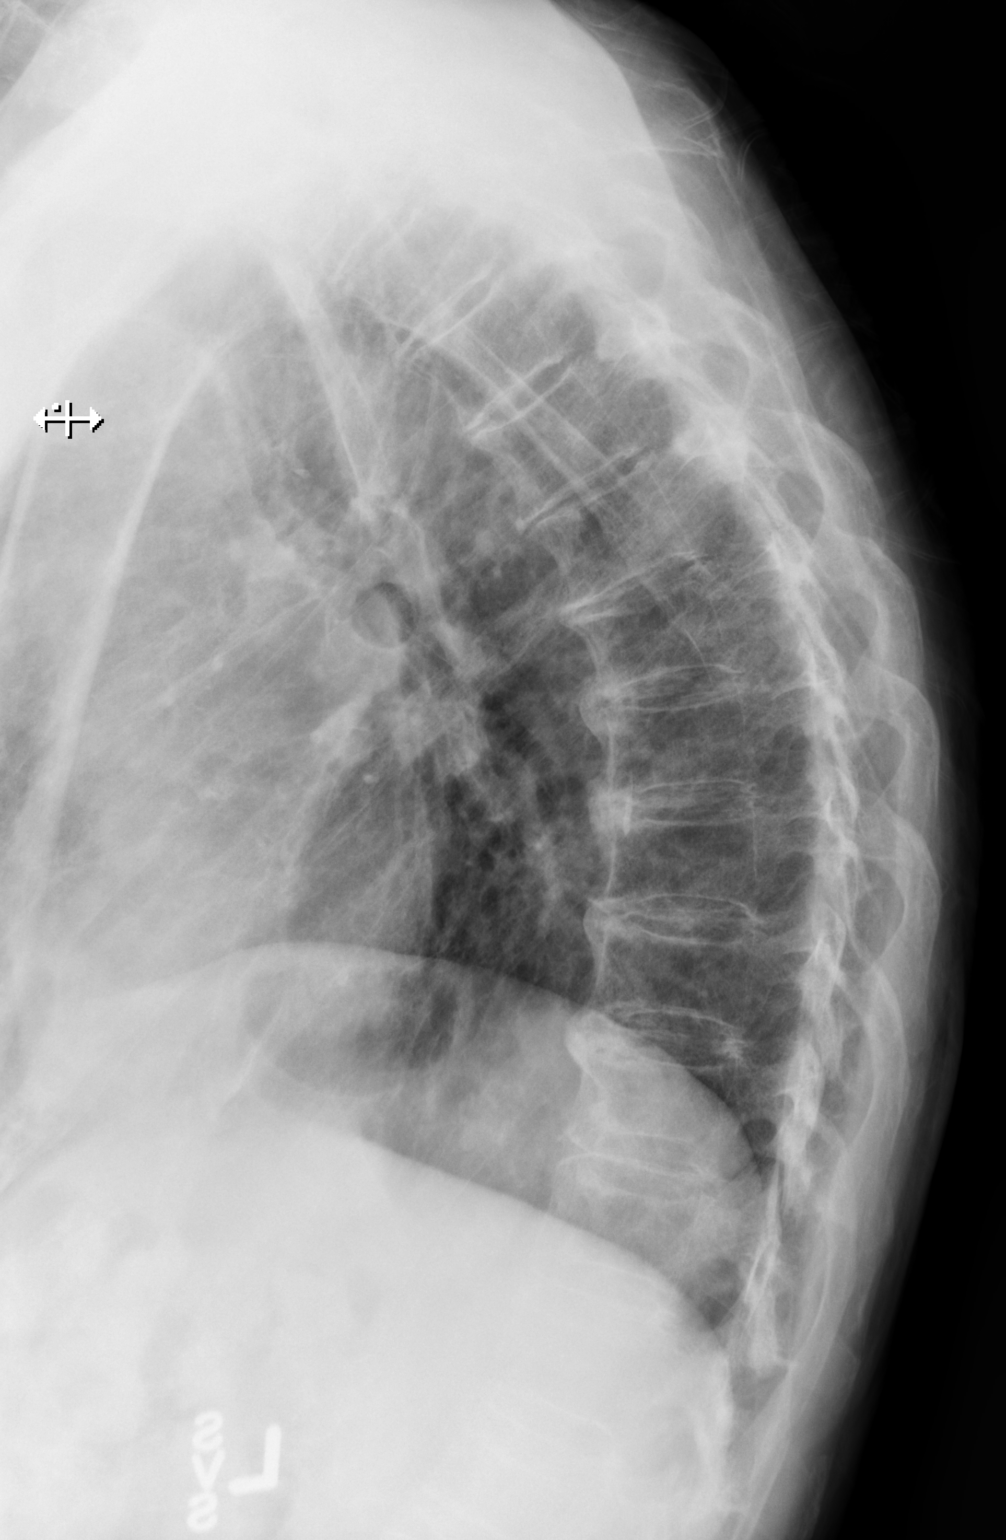

[t thoracic swimmers]
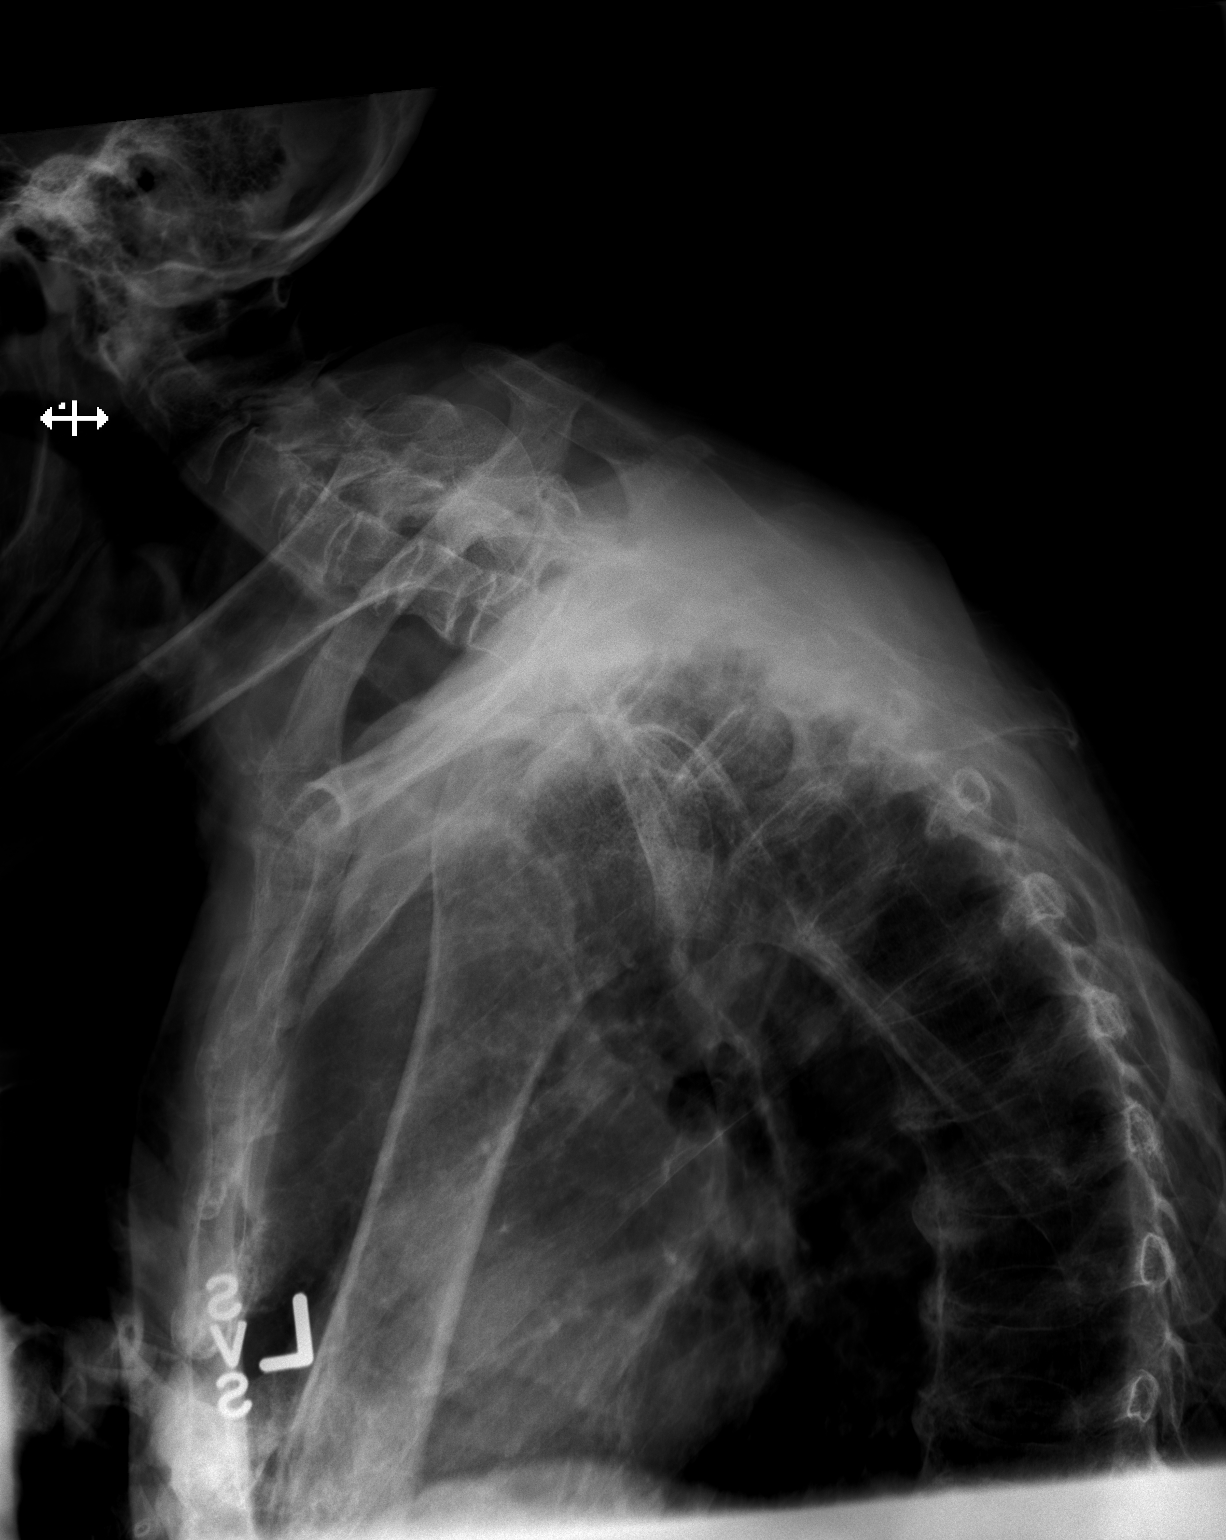

[3 of 3 positions shown; findings below may reference images not displayed]

FINDINGS: Thoracic Spine:

Thoracic vertebral elements maintain normal anatomic alignment, with
no evidence of subluxation. No anterolisthesis or retrolisthesis.

No acute fracture line identified.

Vertebral body heights relatively maintained. Configuration of the
vertebral bodies is unchanged from the comparison plain film
12/12/2014

Disc space narrowing throughout the thoracic spine with mild
endplate changes.

Osteopenia. Anterior osteophyte production present throughout the
thoracic spine.

Unremarkable appearance of the visualized thorax.
IMPRESSION: No radiographic evidence of acute fracture or malalignment of the
thoracic spine.

## 2016-03-09 IMAGING — CT CT HEAD W/O CM
4 of 7 series · 13 of 47 positions shown, 14 images · non-contrast
Comparison: Head CT - 12/12/2014; CTA head and neck- 06/28/2014

CLINICAL DATA: Questionable fall. History of right-sided deficits
from prior stroke.

EXAM:
CT HEAD WITHOUT CONTRAST
CT CERVICAL SPINE WITHOUT CONTRAST
TECHNIQUE: Multidetector CT imaging of the head and cervical spine was
performed following the standard protocol without intravenous
contrast. Multiplanar CT image reconstructions of the cervical spine
were also generated.

[Series 4: head w/o · axial · non-contrast · 0.39mm/px · z∈[+826,+876]mm · 2 of 31 slices shown, 3 images]
[im 11/31  brain]
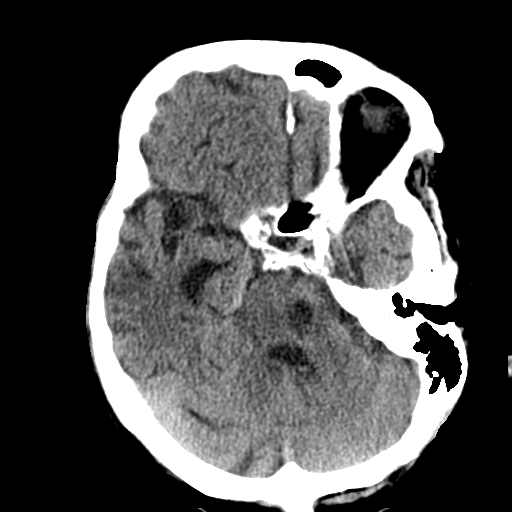
[im 11/31  bone]
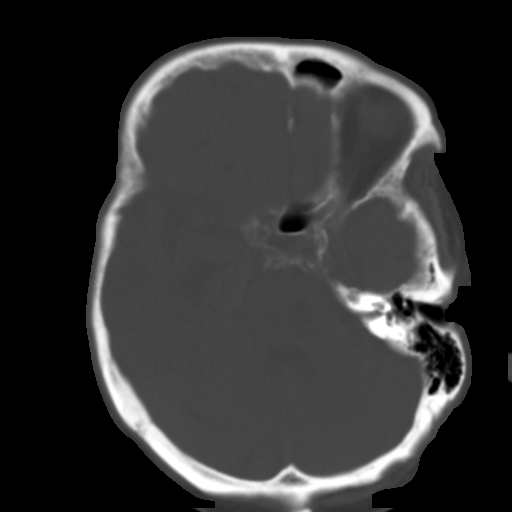
[im 21/31  brain]
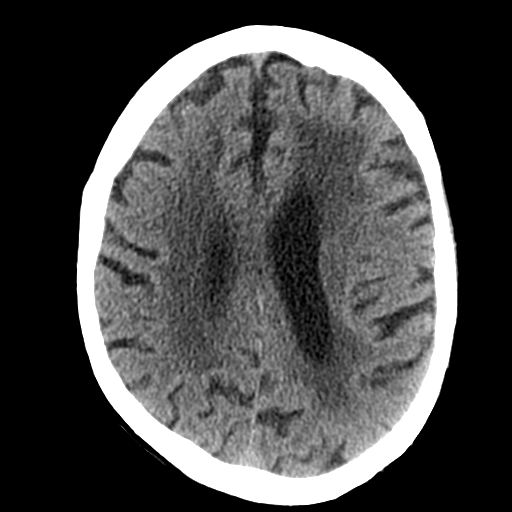

[Series 9: axial reformats · coronal · 0.23mm/px · 3 of 94 slices shown]
[im 32/94  brain]
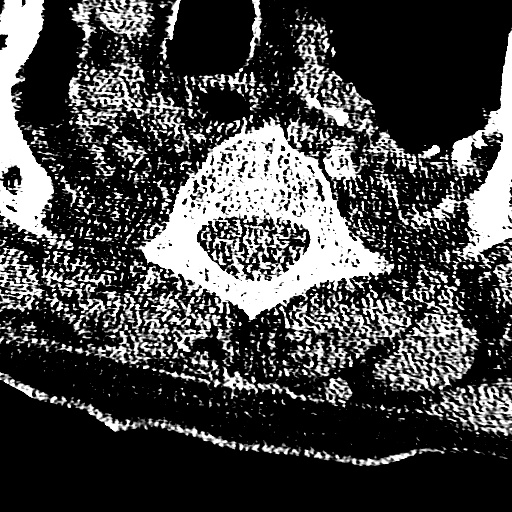
[im 42/94  brain]
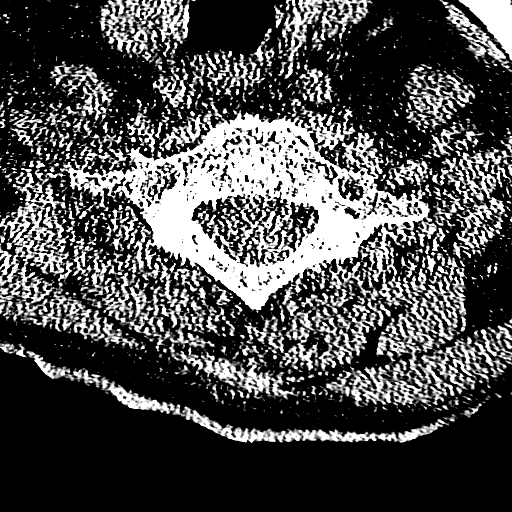
[im 52/94  brain]
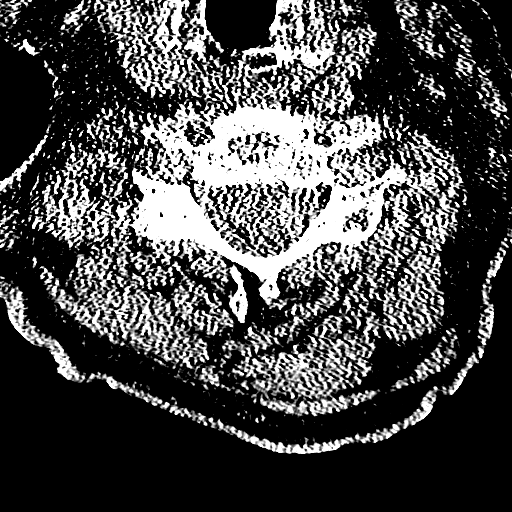

[Series 10: coronal recons · axial · 0.23mm/px · z∈[+751,+812]mm · 5 of 61 slices shown]
[im 9/61  brain]
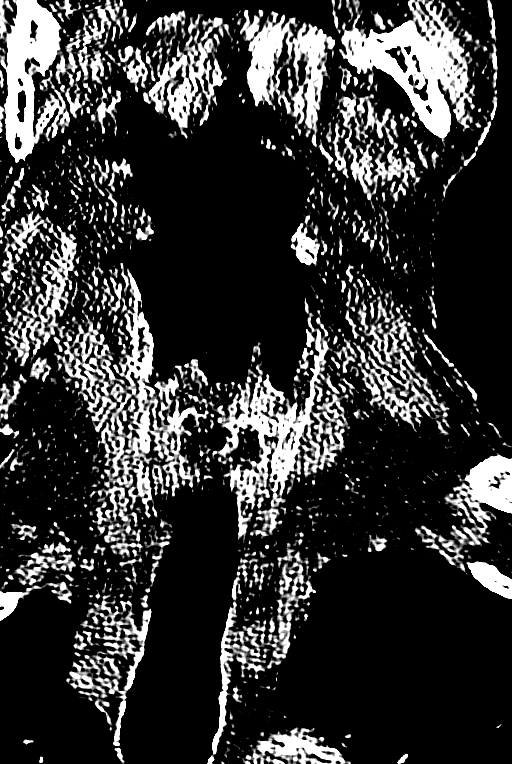
[im 18/61  brain]
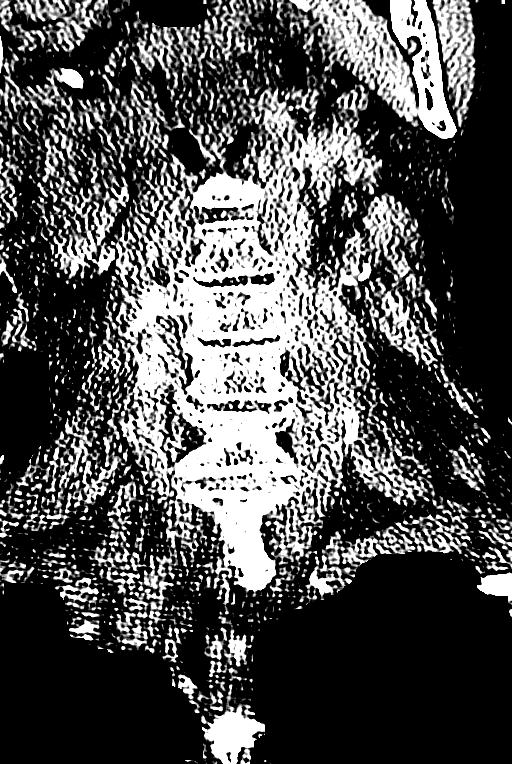
[im 26/61  brain]
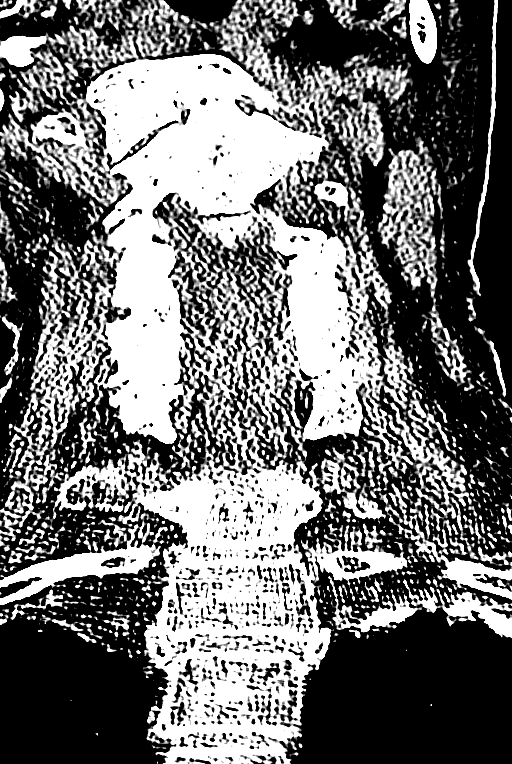
[im 35/61  brain]
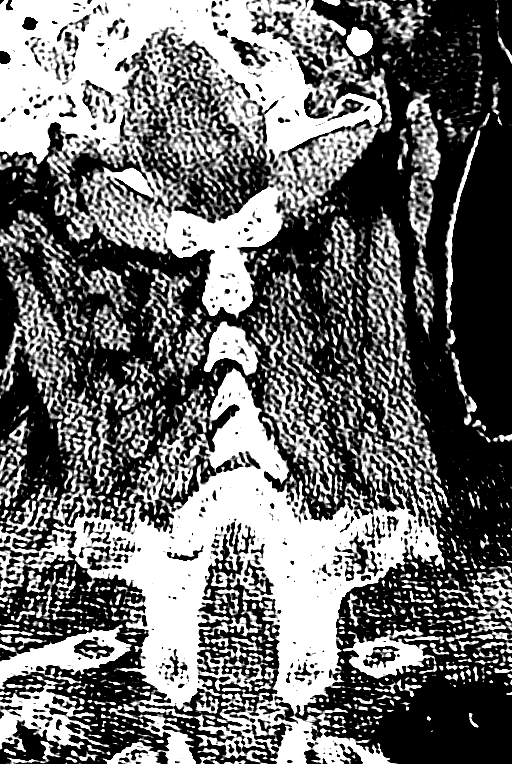
[im 43/61  brain]
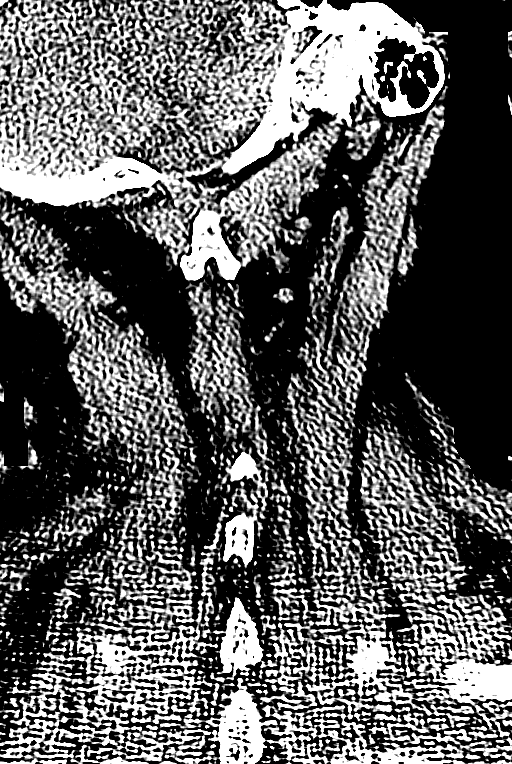

[Series 11: sagittal recons · sagittal · 0.21mm/px · 3 of 61 slices shown]
[im 21/61  brain]
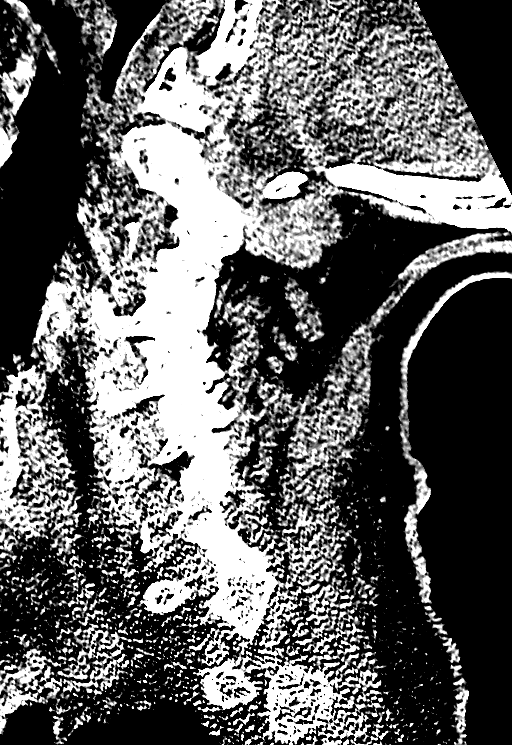
[im 31/61  brain]
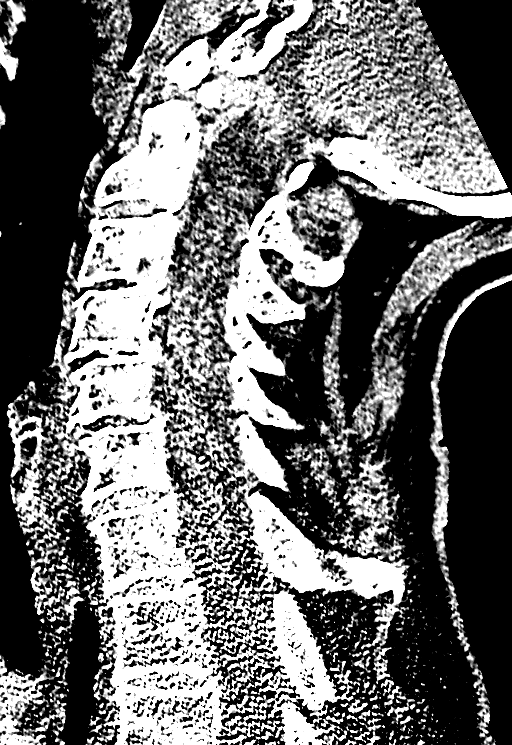
[im 41/61  brain]
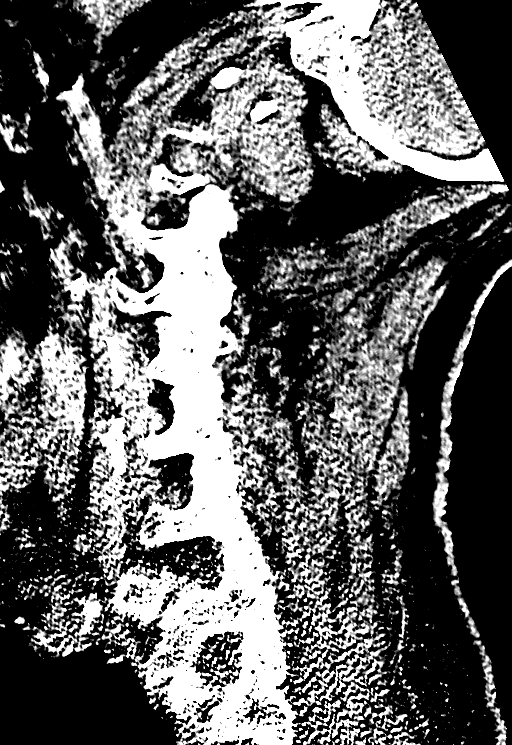

[13 of 47 positions shown; findings below may reference images not displayed]

FINDINGS: CT HEAD FINDINGS

Advanced atrophy with diffuse sulcal prominence. Grossly unchanged
extensive periventricular hypodensities compatible with
microvascular ischemic disease. Old lacunar infarct within the left
midbrain (image 11, series 4). Given extensive background
parenchymal abnormalities, there is no CT evidence of superimposed
acute large territory infarct. No intraparenchymal or extra-axial
mass or hemorrhage. Unchanged size and configuration of the
ventricles and basilar cisterns. No midline shift. Intracranial
atherosclerosis. There is underpneumatization of the right frontal
sinus. The remaining paranasal sinuses and mastoid air cells are
normally aerated. No air-fluid levels. Debris is seen within the
right external auditory canal. Regional soft tissues appear normal.
Post bilateral cataract surgery.

CT CERVICAL SPINE FINDINGS

C1 to the superior endplate of T2 is imaged.

There is mild (approximately 2 mm) of anterolisthesis of C5 upon C6.
The dens is normally positioned between the lateral masses of C1.
There is mild degenerative change of the atlanto dental
articulation. Normal atlanto axial articulations. The bilateral
facets are normally aligned.

No fracture or static subluxation of the cervical spine. Cervical
vertebral body heights are preserved. Prevertebral soft tissues are
normal.

There is mild-to-moderate multilevel cervical spine DDD, worse at
C3-C4 and C4-C5 with disc space height loss, endplate irregularity
and sclerosis.

Atherosclerotic plaque within the bilateral carotid bulbs. No bulky
cervical lymphadenopathy on this noncontrast examination.

Limited visualization of the lung apices demonstrates biapical
pleural parenchymal thickening. Pleural plaques are noted within the
bilateral lung apices. Note is made of a punctate (approximately
x 0.5 cm) nodule within the imaged right lung apex (image 75, series
6), grossly unchanged since CTA obtain [DATE].
IMPRESSION: [:
IMPRESSION: [
1. Advanced atrophy and microvascular ischemic disease without acute
intracranial process.
2. No fracture or static subluxation of the cervical spine.
3. Advanced centrilobular emphysematous change within the imaged
lung apices.
4. Biapical pleural plaques, presumably the sequela of prior
asbestos exposure.
5. Unchanged punctate (approximately 0.8 cm) nodule within the
imaged right lung apex, grossly unchanged since [DATE] examination.
This examination documents 6 months of stability. Given risk factors
for bronchogenic carcinoma, a follow-up chest CT in 3 - 8months is
recommended to ensure continued stability. This recommendation
follows the consensus statement: Guidelines for Management of Small
Pulmonary Nodules Detected on CT Scans: A Statement from the

## 2016-03-25 DIAGNOSIS — L308 Other specified dermatitis: Secondary | ICD-10-CM | POA: Diagnosis not present

## 2016-03-25 DIAGNOSIS — Z85828 Personal history of other malignant neoplasm of skin: Secondary | ICD-10-CM | POA: Diagnosis not present

## 2016-04-03 ENCOUNTER — Ambulatory Visit (HOSPITAL_BASED_OUTPATIENT_CLINIC_OR_DEPARTMENT_OTHER): Payer: Medicare Other | Admitting: Physical Medicine & Rehabilitation

## 2016-04-03 ENCOUNTER — Encounter: Payer: Medicare Other | Attending: Physical Medicine & Rehabilitation

## 2016-04-03 ENCOUNTER — Encounter: Payer: Self-pay | Admitting: Physical Medicine & Rehabilitation

## 2016-04-03 VITALS — BP 117/66 | HR 67 | Resp 14

## 2016-04-03 DIAGNOSIS — M24541 Contracture, right hand: Secondary | ICD-10-CM | POA: Diagnosis not present

## 2016-04-03 DIAGNOSIS — I6350 Cerebral infarction due to unspecified occlusion or stenosis of unspecified cerebral artery: Secondary | ICD-10-CM | POA: Diagnosis not present

## 2016-04-03 DIAGNOSIS — G8111 Spastic hemiplegia affecting right dominant side: Secondary | ICD-10-CM | POA: Insufficient documentation

## 2016-04-03 DIAGNOSIS — I639 Cerebral infarction, unspecified: Secondary | ICD-10-CM

## 2016-04-03 NOTE — Patient Instructions (Signed)
Voice recognition software is Diplomatic Services operational officer by [employer]

## 2016-04-03 NOTE — Progress Notes (Signed)
Subjective:    Patient ID: [name], male    DOB: 02-02-1926, 82 y.o.   MRN: [id number]  HPI  Patient's wife notes that the caregiver has commented his right shoulder and elbow do not hurt as much during dressing tasks or during range of motion. Patient wears a wrist hand orthosis fashion by occupational therapy. Patient did not have any adverse effects from the Dysport injection. As we discussed the Dysport can last longer. We also discussed that Dysport now has increased maximum dosage to 1500 units The patient previously tried Botox at the maximum dose of 400 units  Pain Inventory Average Pain 0 Pain Right Now 0 My pain is no pain  In the last 24 hours, has pain interfered with the following? General activity 0 Relation with others 0 Enjoyment of life 0 What TIME of day is your pain at its worst? no pain Sleep (in general) NA  Pain is worse with: no pain Pain improves with: no pain Relief from Meds: 0  Mobility use a wheelchair needs help with transfers  Function retired I need assistance with the following:  feeding, dressing, bathing, toileting, meal prep, household duties and shopping  Neuro/Psych spasms  Prior Studies Any changes since last visit?  no  Physicians involved in your care Any changes since last visit?  no   Family History  Problem Relation Age of Onset  . CAD Father    Social History   Social History  . Marital status: Married    Spouse name: Herbert Pun  . Number of children: 2  . Years of education: MBA   Social History Main Topics  . Smoking status: Former Research scientist (life sciences)  . Smokeless tobacco: Former Systems developer  . Alcohol use No     Comment: Occasionally  . Drug use: No  . Sexual activity: Not Asked   Other Topics Concern  . None   Social History Narrative   Live at [employer].   Caffeine: Drinks 2 diet pepsi per week.    Drinks coffee once per week.    Past Surgical History:  Procedure Laterality Date  . APPENDECTOMY    .  KIDNEY SURGERY     Past Medical History:  Diagnosis Date  . Cancer ([place])   . Hearing loss   . Stroke ([place])    BP 117/66 (BP Location: Left Arm, Patient Position: Sitting, Cuff Size: Normal)   Pulse 67   Resp 14   SpO2 98%   Opioid Risk Score:   Fall Risk Score:  `1  Depression screen PHQ 2/9  Depression screen [facility] 2/9 04/03/2016 11/21/2015 10/10/2015 05/21/2015  Decreased Interest 0 0 0 0  Down, Depressed, Hopeless 0 0 0 0  PHQ - 2 Score 0 0 0 0   Review of Systems  All other systems reviewed and are negative.      Objective:   Physical Exam Ashworth grade 4 at the pectoralis Ashworth grade 3 at the biceps and brachial radialis Ashworth grade 4 at the FCR, FCU Ashworth grade 3-4 at the FDS, FDP No active movement in the right upper extremity Able to abduct his shoulder sufficiently to reach underneath the axillary area.  His wrist is at 90 of flexion. His fingers are at 90 of flexion at the PIPs 45 at the MCPs        Assessment & Plan:  1. 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 We did discuss mechanism of his stroke and his  transient A. fib that he had associated with his pneumonia.  We also discussed his spasticity management. We discussed his exercise program and the fact that he do not expect any further spontaneous improvement now that he has greater than one year post stroke  We discussed that his Dysport dose can be increased to see if any further range of motion improvements can be obtained  Recommend following dosages Pectoralis 600 units Biceps 200 units Brachial radialis, 100 units FCU, 200 units FCR, 200 units FDS, 100 units FDP 100 units  Over half of the 25 min visit was spent counseling and coordinating care.

## 2016-04-15 DIAGNOSIS — C61 Malignant neoplasm of prostate: Secondary | ICD-10-CM | POA: Diagnosis not present

## 2016-04-15 DIAGNOSIS — R310 Gross hematuria: Secondary | ICD-10-CM | POA: Diagnosis not present

## 2016-04-15 DIAGNOSIS — C641 Malignant neoplasm of right kidney, except renal pelvis: Secondary | ICD-10-CM | POA: Diagnosis not present

## 2016-04-15 DIAGNOSIS — R9721 Rising PSA following treatment for malignant neoplasm of prostate: Secondary | ICD-10-CM | POA: Diagnosis not present

## 2016-04-24 DIAGNOSIS — Z85828 Personal history of other malignant neoplasm of skin: Secondary | ICD-10-CM | POA: Diagnosis not present

## 2016-04-24 DIAGNOSIS — L570 Actinic keratosis: Secondary | ICD-10-CM | POA: Diagnosis not present

## 2016-04-24 DIAGNOSIS — L308 Other specified dermatitis: Secondary | ICD-10-CM | POA: Diagnosis not present

## 2016-04-30 DIAGNOSIS — R310 Gross hematuria: Secondary | ICD-10-CM | POA: Diagnosis not present

## 2016-05-28 DIAGNOSIS — G8111 Spastic hemiplegia affecting right dominant side: Secondary | ICD-10-CM | POA: Diagnosis not present

## 2016-05-28 DIAGNOSIS — M25473 Effusion, unspecified ankle: Secondary | ICD-10-CM | POA: Diagnosis not present

## 2016-05-28 DIAGNOSIS — R739 Hyperglycemia, unspecified: Secondary | ICD-10-CM | POA: Diagnosis not present

## 2016-05-28 DIAGNOSIS — Z79899 Other long term (current) drug therapy: Secondary | ICD-10-CM | POA: Diagnosis not present

## 2016-05-28 DIAGNOSIS — I639 Cerebral infarction, unspecified: Secondary | ICD-10-CM | POA: Diagnosis not present

## 2016-05-28 DIAGNOSIS — E785 Hyperlipidemia, unspecified: Secondary | ICD-10-CM | POA: Diagnosis not present

## 2016-05-28 DIAGNOSIS — K219 Gastro-esophageal reflux disease without esophagitis: Secondary | ICD-10-CM | POA: Diagnosis not present

## 2016-05-28 DIAGNOSIS — F329 Major depressive disorder, single episode, unspecified: Secondary | ICD-10-CM | POA: Diagnosis not present

## 2016-05-28 DIAGNOSIS — R1310 Dysphagia, unspecified: Secondary | ICD-10-CM | POA: Diagnosis not present

## 2016-05-28 DIAGNOSIS — F0150 Vascular dementia without behavioral disturbance: Secondary | ICD-10-CM | POA: Diagnosis not present

## 2016-05-28 DIAGNOSIS — R2681 Unsteadiness on feet: Secondary | ICD-10-CM | POA: Diagnosis not present

## 2016-06-01 DIAGNOSIS — N320 Bladder-neck obstruction: Secondary | ICD-10-CM | POA: Diagnosis not present

## 2016-06-01 DIAGNOSIS — C61 Malignant neoplasm of prostate: Secondary | ICD-10-CM | POA: Diagnosis not present

## 2016-06-01 DIAGNOSIS — R310 Gross hematuria: Secondary | ICD-10-CM | POA: Diagnosis not present

## 2016-06-01 DIAGNOSIS — C641 Malignant neoplasm of right kidney, except renal pelvis: Secondary | ICD-10-CM | POA: Diagnosis not present

## 2016-06-05 ENCOUNTER — Encounter: Payer: Medicare Other | Attending: Physical Medicine & Rehabilitation

## 2016-06-05 ENCOUNTER — Encounter: Payer: Self-pay | Admitting: Physical Medicine & Rehabilitation

## 2016-06-05 ENCOUNTER — Ambulatory Visit (HOSPITAL_BASED_OUTPATIENT_CLINIC_OR_DEPARTMENT_OTHER): Payer: Medicare Other | Admitting: Physical Medicine & Rehabilitation

## 2016-06-05 VITALS — BP 111/68 | HR 73 | Resp 14

## 2016-06-05 DIAGNOSIS — I6350 Cerebral infarction due to unspecified occlusion or stenosis of unspecified cerebral artery: Secondary | ICD-10-CM | POA: Insufficient documentation

## 2016-06-05 DIAGNOSIS — G8111 Spastic hemiplegia affecting right dominant side: Secondary | ICD-10-CM | POA: Diagnosis not present

## 2016-06-05 DIAGNOSIS — G8110 Spastic hemiplegia affecting unspecified side: Secondary | ICD-10-CM

## 2016-06-05 NOTE — Progress Notes (Addendum)
Dysport Injection for spasticity using needle EMG guidance  Dilution: 200 Units/ml Indication: Right spastic hemiplegia affecting dominant side which interferes with ADL,mobility and/or  hygiene and is unresponsive to medication management and other conservative care Informed consent was obtained after describing risks and benefits of the procedure with the patient. This includes bleeding, bruising, infection, excessive weakness, or medication side effects. A REMS form is on file and signed. Needle:  needle electrode Number of units per muscle Right Pectoralis 600 units Biceps 200 units Pronator teres, 200 units FCU, 100 units FCR, 200 units FDS, 100 units FDP 100 unitsAll injections were done after obtaining appropriate EMG activity and after negative drawback for blood. The patient tolerated the procedure well. Post procedure instructions were given. A followup appointment was made.

## 2016-06-05 NOTE — Patient Instructions (Signed)

## 2016-07-17 ENCOUNTER — Ambulatory Visit: Payer: Medicare Other | Admitting: Physical Medicine & Rehabilitation

## 2016-07-24 ENCOUNTER — Other Ambulatory Visit: Payer: Self-pay | Admitting: Physician Assistant

## 2016-07-24 ENCOUNTER — Ambulatory Visit
Admission: RE | Admit: 2016-07-24 | Discharge: 2016-07-24 | Disposition: A | Discharge status: Home / Self Care | Payer: Medicare Other | Source: Ambulatory Visit | Attending: Physician Assistant | Admitting: Physician Assistant

## 2016-07-24 DIAGNOSIS — R5381 Other malaise: Secondary | ICD-10-CM | POA: Diagnosis not present

## 2016-07-24 DIAGNOSIS — R05 Cough: Secondary | ICD-10-CM | POA: Diagnosis not present

## 2016-07-24 DIAGNOSIS — M549 Dorsalgia, unspecified: Secondary | ICD-10-CM | POA: Diagnosis not present

## 2016-07-24 DIAGNOSIS — R0602 Shortness of breath: Secondary | ICD-10-CM | POA: Diagnosis not present

## 2016-07-24 DIAGNOSIS — I6930 Unspecified sequelae of cerebral infarction: Secondary | ICD-10-CM | POA: Diagnosis not present

## 2016-08-06 DIAGNOSIS — L0889 Other specified local infections of the skin and subcutaneous tissue: Secondary | ICD-10-CM | POA: Diagnosis not present

## 2016-08-06 DIAGNOSIS — L0109 Other impetigo: Secondary | ICD-10-CM | POA: Diagnosis not present

## 2016-08-06 DIAGNOSIS — D0439 Carcinoma in situ of skin of other parts of face: Secondary | ICD-10-CM | POA: Diagnosis not present

## 2016-08-06 DIAGNOSIS — Z85828 Personal history of other malignant neoplasm of skin: Secondary | ICD-10-CM | POA: Diagnosis not present

## 2016-08-06 DIAGNOSIS — L308 Other specified dermatitis: Secondary | ICD-10-CM | POA: Diagnosis not present

## 2016-08-13 ENCOUNTER — Emergency Department (HOSPITAL_COMMUNITY)
Admission: EM | Admit: 2016-08-13 | Discharge: 2016-08-13 | Disposition: A | Discharge status: Home / Self Care | Payer: Medicare Other | Attending: Emergency Medicine | Admitting: Emergency Medicine

## 2016-08-13 ENCOUNTER — Ambulatory Visit: Payer: Medicare Other | Admitting: Physical Medicine & Rehabilitation

## 2016-08-13 ENCOUNTER — Encounter (HOSPITAL_COMMUNITY): Payer: Self-pay | Admitting: Emergency Medicine

## 2016-08-13 DIAGNOSIS — Z8673 Personal history of transient ischemic attack (TIA), and cerebral infarction without residual deficits: Secondary | ICD-10-CM | POA: Diagnosis not present

## 2016-08-13 DIAGNOSIS — R112 Nausea with vomiting, unspecified: Secondary | ICD-10-CM | POA: Diagnosis not present

## 2016-08-13 DIAGNOSIS — I10 Essential (primary) hypertension: Secondary | ICD-10-CM | POA: Diagnosis not present

## 2016-08-13 DIAGNOSIS — R339 Retention of urine, unspecified: Secondary | ICD-10-CM | POA: Diagnosis not present

## 2016-08-13 DIAGNOSIS — Z87891 Personal history of nicotine dependence: Secondary | ICD-10-CM | POA: Diagnosis not present

## 2016-08-13 DIAGNOSIS — R310 Gross hematuria: Secondary | ICD-10-CM | POA: Insufficient documentation

## 2016-08-13 DIAGNOSIS — Z8546 Personal history of malignant neoplasm of prostate: Secondary | ICD-10-CM | POA: Diagnosis not present

## 2016-08-13 DIAGNOSIS — R319 Hematuria, unspecified: Secondary | ICD-10-CM | POA: Diagnosis present

## 2016-08-13 DIAGNOSIS — R531 Weakness: Secondary | ICD-10-CM | POA: Diagnosis not present

## 2016-08-13 LAB — PROTIME-INR
INR: 0.99
PROTHROMBIN TIME: 13.1 s (ref 11.4–15.2)

## 2016-08-13 LAB — BASIC METABOLIC PANEL
Anion gap: 7 (ref 5–15)
BUN: 26 mg/dL — AB (ref 6–20)
CO2: 24 mmol/L (ref 22–32)
CREATININE: 1.01 mg/dL (ref 0.61–1.24)
Calcium: 9.5 mg/dL (ref 8.9–10.3)
Chloride: 106 mmol/L (ref 101–111)
GFR calc Af Amer: >60 mL/min (ref >60)
GLUCOSE: 139 mg/dL — AB (ref 65–99)
Potassium: 4.4 mmol/L (ref 3.5–5.1)
SODIUM: 137 mmol/L (ref 135–145)

## 2016-08-13 LAB — CBC WITH DIFFERENTIAL/PLATELET
BASOS ABS: 0 10*3/uL (ref 0.0–0.1)
Basophils Relative: 0 %
EOS ABS: 0 10*3/uL (ref 0.0–0.7)
EOS PCT: 0 %
HCT: 39 % (ref 39.0–52.0)
Hemoglobin: 13 g/dL (ref 13.0–17.0)
LYMPHS ABS: 0.7 10*3/uL (ref 0.7–4.0)
Lymphocytes Relative: 4 %
MCH: 29.1 pg (ref 26.0–34.0)
MCHC: 33.3 g/dL (ref 30.0–36.0)
MCV: 87.2 fL (ref 78.0–100.0)
MONO ABS: 0.8 10*3/uL (ref 0.1–1.0)
Monocytes Relative: 4 %
Neutro Abs: 18 10*3/uL — ABNORMAL HIGH (ref 1.7–7.7)
Neutrophils Relative %: 92 %
PLATELETS: 412 10*3/uL — AB (ref 150–400)
RBC: 4.47 MIL/uL (ref 4.22–5.81)
RDW: 13.2 % (ref 11.5–15.5)
WBC: 19.5 10*3/uL — ABNORMAL HIGH (ref 4.0–10.5)

## 2016-08-13 MED ORDER — FENTANYL CITRATE (PF) 100 MCG/2ML IJ SOLN
50.0000 ug | Freq: Once | INTRAMUSCULAR | Status: AC
  Administered 2016-08-13: 50 ug via INTRAVENOUS
  Filled 2016-08-13: qty 2

## 2016-08-13 MED ORDER — ONDANSETRON 4 MG PO TBDP: 4.0000 mg | ORAL_TABLET | Freq: Three times a day (TID) | ORAL | 0 refills | Status: DC | PRN

## 2016-08-13 NOTE — ED Notes (Signed)
412 in the bladder

## 2016-08-13 NOTE — ED Notes (Signed)
MD aware we were unable to obtain UA

## 2016-08-13 NOTE — Discharge Instructions (Signed)
PLEASE GO TO THE UROLOGIST for further care.  Please return to the ER if your symptoms worsen; you have increased pain, fevers, chills, inability to keep any medications down, confusion. Otherwise see the primary care doctor in 1 week.

## 2016-08-13 NOTE — ED Triage Notes (Signed)
Per PTAR patient from home c/o n/v that has worsened over past 3 days.  Patient also had some hematuria with dont know if related to radiation on prostate or not.  CBG 148.

## 2016-08-13 NOTE — ED Notes (Signed)
Patient d/c'd self care.  F/U and medications reviewed.  Patient and family verbalized understanding.

## 2016-08-13 NOTE — ED Notes (Signed)
ED Provider at bedside. 

## 2016-08-13 NOTE — ED Notes (Signed)
Attempted 18Fr foley catheter insertion.  Met resistance and patient in lots of pain.  Refrained from inserting foley cath any further and removed catheter.  When removing catheter, large size blood clot came out and urine flowed freely.  Will make MD aware.  

## 2016-08-13 NOTE — ED Notes (Signed)
Patient states that he is here because he has bleeding from penis. Patient states that he has had bleeding since yesterday.  Patient denies n/v.

## 2016-08-13 NOTE — ED Notes (Signed)
Bed: WA06 Expected date:  Expected time:  Means of arrival:  Comments: EMS- 83yo M, hematuria, n/v

## 2016-08-13 NOTE — ED Provider Notes (Signed)
[name] Provider Note   CSN: [id number] Arrival date & time: 08/13/16  [id number]     History   Chief Complaint Chief Complaint  Patient presents with  . Hematuria    HPI [name] is a 83 y.o. male.  HPI 83 y.o male with hx of prostate CA s/p radiation and renal CA s/p resection comes in with emesis and blood from penis. PT has had chronic hematuria, intermittent, but it is worse than usual starting last night. PT also has increased pressure around his bladder that is constant. Pt called Urologist, who requested that pt come to the ER. Pt has hx of UTi, but typically there is confusion with uti and pt is quiet sharp now.  Pt also reports nausea and emesis. He reports no appetite. These symptoms also present since last night. Pt has chronic uri. No fevers. Pt is not on blood thinners, but he is on plavix.  Past Medical History:  Diagnosis Date  . Cancer ([place])   . Hearing loss   . Stroke [facility])     Patient Active Problem List   Diagnosis Date Noted  . Flexion contracture of joint of hand 12/27/2015  . Spastic hemiplegia affecting right dominant side ([place]) 05/31/2015  . Sepsis ([place]) 10/08/2014  . Acute encephalopathy 10/08/2014  . UTI (lower urinary tract infection) 10/08/2014  . Elevated troponin 10/08/2014  . Right hemiparesis ([place]) 07/05/2014  . Dysphagia, post-stroke 07/05/2014  . Dysarthria due to recent cerebral infarction 07/05/2014  . Left pontine stroke ([place]) 07/02/2014  . Embolic stroke involving left carotid artery ([place]) 06/28/2014  . Stroke ([place]) 06/28/2014  . CAP (community acquired pneumonia) 06/28/2014  . Normocytic anemia 06/28/2014  . Essential hypertension 06/28/2014  . Hypokalemia 06/28/2014    Past Surgical History:  Procedure Laterality Date  . APPENDECTOMY    . KIDNEY SURGERY         Home Medications    Prior to Admission medications   Medication Sig Start Date End Date Taking? Authorizing Provider  atorvastatin (LIPITOR)  20 MG tablet Take 1 tablet (20 mg total) by mouth daily at 6 PM. Patient taking differently: Take 10 mg by mouth daily at 6 PM. TAkes 1/2 tablet 07/30/14  Yes Ivan Anchors Love, PA-C  buPROPion (WELLBUTRIN XL) 150 MG 24 hr tablet Take 150 mg by mouth daily.   Yes Historical Provider, MD  clopidogrel (PLAVIX) 75 MG tablet Take 1 tablet (75 mg total) by mouth daily. 10/03/14  Yes Britt Bottom, MD  doxycycline (VIBRA-TABS) 100 MG tablet Take 100 mg by mouth 2 (two) times daily.   Yes Historical Provider, MD  fluocinonide ointment (LIDEX) 0.05 % Applies twice a day to scalp. 03/25/16  Yes Historical Provider, MD  ranitidine (ZANTAC) 150 MG tablet Take 150 mg by mouth 2 (two) times daily.   Yes Historical Provider, MD  rivastigmine (EXELON) 9.5 mg/24hr Place onto the skin. 10/25/15 10/24/16 Yes Historical Provider, MD  tamsulosin (FLOMAX) 0.4 MG CAPS capsule Take 0.4 mg by mouth at bedtime.    Yes Historical Provider, MD  ondansetron (ZOFRAN ODT) 4 MG disintegrating tablet Take 1 tablet (4 mg total) by mouth every 8 (eight) hours as needed for nausea or vomiting. 08/13/16   Varney Biles, MD    Family History Family History  Problem Relation Age of Onset  . CAD Father     Social History Social History  Substance Use Topics  . Smoking status: Former Research scientist (life sciences)  . Smokeless tobacco: Former Systems developer  .  Alcohol use No     Comment: Occasionally     Allergies   Patient has no known allergies.   Review of Systems Review of Systems  ROS 10 Systems reviewed and are negative for acute change except as noted in the HPI.    Physical Exam Updated Vital Signs BP 193/90 (BP Location: Left Arm)   Pulse 75   Temp 99 F (37.2 C) (Oral)   Resp 17   SpO2 100%   Physical Exam  Constitutional: He is oriented to person, place, and time. He appears well-developed.  HENT:  Head: Normocephalic and atraumatic.  Eyes: Conjunctivae and EOM are normal. Pupils are equal, round, and reactive to light.  Neck: Normal  range of motion. Neck supple.  Cardiovascular: Normal rate and regular rhythm.   Pulmonary/Chest: Effort normal and breath sounds normal.  Abdominal: Soft. Bowel sounds are normal. He exhibits no distension. There is tenderness. There is guarding. There is no rebound.  Suprapubic tenderness, no scrotal swelling or tenderness  Neurological: He is alert and oriented to person, place, and time.  Skin: Skin is warm.  Nursing note and vitals reviewed.    ED Treatments / Results  Labs (all labs ordered are listed, but only abnormal results are displayed) Labs Reviewed  BASIC METABOLIC PANEL - Abnormal; Notable for the following:       Result Value   Glucose, Bld 139 (*)    BUN 26 (*)    All other components within normal limits  CBC WITH DIFFERENTIAL/PLATELET - Abnormal; Notable for the following:    WBC 19.5 (*)    Platelets 412 (*)    Neutro Abs 18.0 (*)    All other components within normal limits  URINE CULTURE  PROTIME-INR  URINALYSIS, ROUTINE W REFLEX MICROSCOPIC    EKG  EKG Interpretation None       Radiology No results found.  Procedures Procedures (including critical care time)  Medications Ordered in ED Medications  fentaNYL (SUBLIMAZE) injection 50 mcg (50 mcg Intravenous Given 08/13/16 1118)     Initial Impression / Assessment and Plan / ED Course  I have reviewed the triage vital signs and the nursing notes.  Pertinent labs & imaging results that were available during my care of the patient were reviewed by me and considered in my medical decision making (see chart for details).  Clinical Course as of Aug 13 1352  Thu Aug 13, 2016  1117 I forgot to "approve" fentanyl that was ordered -and so pain meds delayed. PT's family upset at the RN, as I had informed them that pain meds will be ordered. I informed them the reason for delay and that it wasn't RNs fault. They are also aware that foley catheter is coming - as bladder scan reveals > 400 cc urine. I  have requested 3 way catheter, so that we can irrigate the bladder.  [AN]  1351 I spoke with Dr. Karsten Ro, Urology. We discussed the presentation, the inability for [place] to pass a foley and the worsening hematuria. Dr. Karsten Ro reported that he will be glad to see the patient today itself, and further workup the gross hematuria and help with urinary retention.  This was informed to the patient and family.  I also discussed with the family, that pt has elevated WC, emesis - and we didn't get a UA (which Urology reported that will screen). However, we didn't have the luxury in the ER for the patient to get the catheter, get the UA sent,  and reassess for improvement. So even though Urology is going to kindly help with urologic issues, if pt is worsening they should return to the ER. Strict ER return precautions have been discussed, and patient is agreeing with the plan and is comfortable with the workup done and the recommendations from the ER.   [AN]    Clinical Course User Index [AN] Varney Biles, MD   Pt with nausea, emesis, suprapubic pain.  Clinical concerns for cystitis, bladder obstruction. We will get bladder scan and post void residual.  Final Clinical Impressions(s) / ED Diagnoses   Final diagnoses:  Urinary retention  Gross hematuria  Non-intractable vomiting with nausea, unspecified vomiting type    New Prescriptions Discharge Medication List as of 08/13/2016 12:46 PM    START taking these medications   Details  ondansetron (ZOFRAN ODT) 4 MG disintegrating tablet Take 1 tablet (4 mg total) by mouth every 8 (eight) hours as needed for nausea or vomiting., Starting Thu 08/13/2016, Print         Varney Biles, MD 08/13/16 1354

## 2016-08-17 DIAGNOSIS — F322 Major depressive disorder, single episode, severe without psychotic features: Secondary | ICD-10-CM | POA: Diagnosis not present

## 2016-08-17 DIAGNOSIS — F39 Unspecified mood [affective] disorder: Secondary | ICD-10-CM | POA: Diagnosis not present

## 2016-08-17 DIAGNOSIS — I6930 Unspecified sequelae of cerebral infarction: Secondary | ICD-10-CM | POA: Diagnosis not present

## 2016-08-17 DIAGNOSIS — I10 Essential (primary) hypertension: Secondary | ICD-10-CM | POA: Diagnosis not present

## 2016-08-17 DIAGNOSIS — E559 Vitamin D deficiency, unspecified: Secondary | ICD-10-CM | POA: Diagnosis not present

## 2016-08-17 DIAGNOSIS — G8191 Hemiplegia, unspecified affecting right dominant side: Secondary | ICD-10-CM | POA: Diagnosis not present

## 2016-08-17 DIAGNOSIS — R471 Dysarthria and anarthria: Secondary | ICD-10-CM | POA: Diagnosis not present

## 2016-08-17 DIAGNOSIS — E785 Hyperlipidemia, unspecified: Secondary | ICD-10-CM | POA: Diagnosis not present

## 2016-08-24 DIAGNOSIS — R310 Gross hematuria: Secondary | ICD-10-CM | POA: Diagnosis not present

## 2016-08-24 DIAGNOSIS — N3001 Acute cystitis with hematuria: Secondary | ICD-10-CM | POA: Diagnosis not present

## 2016-08-24 DIAGNOSIS — N320 Bladder-neck obstruction: Secondary | ICD-10-CM | POA: Diagnosis not present

## 2016-08-25 ENCOUNTER — Ambulatory Visit (HOSPITAL_BASED_OUTPATIENT_CLINIC_OR_DEPARTMENT_OTHER): Payer: Medicare Other | Admitting: Physical Medicine & Rehabilitation

## 2016-08-25 ENCOUNTER — Encounter: Payer: Medicare Other | Attending: Physical Medicine & Rehabilitation

## 2016-08-25 ENCOUNTER — Encounter: Payer: Self-pay | Admitting: Physical Medicine & Rehabilitation

## 2016-08-25 VITALS — BP 114/69 | HR 75 | Resp 14

## 2016-08-25 DIAGNOSIS — I639 Cerebral infarction, unspecified: Secondary | ICD-10-CM | POA: Diagnosis not present

## 2016-08-25 DIAGNOSIS — IMO0002 Reserved for concepts with insufficient information to code with codable children: Secondary | ICD-10-CM

## 2016-08-25 DIAGNOSIS — G8111 Spastic hemiplegia affecting right dominant side: Secondary | ICD-10-CM

## 2016-08-25 DIAGNOSIS — M24511 Contracture, right shoulder: Secondary | ICD-10-CM

## 2016-08-25 DIAGNOSIS — M24531 Contracture, right wrist: Secondary | ICD-10-CM

## 2016-08-25 DIAGNOSIS — I6350 Cerebral infarction due to unspecified occlusion or stenosis of unspecified cerebral artery: Secondary | ICD-10-CM | POA: Insufficient documentation

## 2016-08-25 DIAGNOSIS — M24521 Contracture, right elbow: Secondary | ICD-10-CM | POA: Insufficient documentation

## 2016-08-25 NOTE — Patient Instructions (Signed)
Please do range of motion exercises twice a day  Stretch , elbow flexors Shoulder Wrist and finger flexors Knee flexors  Hold constant stretch 30-60 seconds  Prescription written to repair ankle-foot orthotic  Repeat Dysport in 1 month

## 2016-08-25 NOTE — Progress Notes (Signed)
Subjective:    Patient ID: [name], male    DOB: [birth year], 83 y.o.   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 Has had severe spasticity and right hemiparesis. He is nonambulatory. Requires max assist for transfers. Lives at home with a caretaker and his wife.  Underwent Dysport injection on 06/05/2016 Pectoralis 600 units Biceps 200 units Pronator teres, 200 units FCU, 100 units FCR, 200 units FDS, 100 units FDP 100 units     Pain Inventory Average Pain 0 Pain Right Now 0 My pain is no pain  In the last 24 hours, has pain interfered with the following? General activity 0 Relation with others 0 Enjoyment of life 0 What TIME of day is your pain at its worst? no pain Sleep (in general) NA  Pain is worse with: no pain Pain improves with: no pain Relief from Meds: no pain  Mobility use a wheelchair  Function retired  Neuro/Psych No problems in this area  Prior Studies Any changes since last visit?  no  Physicians involved in your care Any changes since last visit?  no   Family History  Problem Relation Age of Onset  . CAD Father    Social History   Social History  . Marital status: Married    Spouse name: Herbert Pun  . Number of children: 2  . Years of education: MBA   Social History Main Topics  . Smoking status: Former Research scientist (life sciences)  . Smokeless tobacco: Former Systems developer  . Alcohol use No     Comment: Occasionally  . Drug use: No  . Sexual activity: Not Asked   Other Topics Concern  . None   Social History Narrative   Live at [employer].   Caffeine: Drinks 2 diet pepsi per week.    Drinks coffee once per week.    Past Surgical History:  Procedure Laterality Date  . APPENDECTOMY    . KIDNEY SURGERY     Past Medical History:  Diagnosis Date  . Cancer ([place])   . Hearing loss   . Stroke ([place])    BP 114/69   Pulse 75   Resp 14   SpO2 97%   Opioid Risk Score:   Fall  Risk Score:  `1  Depression screen PHQ 2/9  Depression screen [facility] 2/9 04/03/2016 11/21/2015 10/10/2015 05/21/2015  Decreased Interest 0 0 0 0  Down, Depressed, Hopeless 0 0 0 0  PHQ - 2 Score 0 0 0 0    Review of Systems  Constitutional: Negative.   HENT: Negative.   Eyes: Negative.   Respiratory: Negative.   Cardiovascular: Negative.   Gastrointestinal: Negative.   Endocrine: Negative.   Genitourinary: Negative.   Musculoskeletal: Positive for gait problem.  Skin: Negative.   Allergic/Immunologic: Negative.   Hematological: Negative.   Psychiatric/Behavioral: Negative.   All other systems reviewed and are negative.      Objective:   Physical Exam  Constitutional: He is oriented to person, place, and time. He appears well-developed.  Thin  HENT:  Head: Normocephalic and atraumatic.  Large seborrheic keratosis on scalp  Eyes: Conjunctivae and EOM are normal. Pupils are equal, round, and reactive to light.  Musculoskeletal:       Right knee: He exhibits decreased range of motion and deformity. He exhibits no effusion.  Right knee flexion contracture  Neurological: He is alert and oriented to person, place, and time. He displays atrophy. He exhibits abnormal muscle  tone.  Ashworth grade 4 at the pectoralis, 4 at the elbow flexors, 3 at the wrist flexors and 3-4 at the finger flexors on the right side. Right is 0/5 in the right upper extremity. Right lower extremity has 3 minus right hip, knees, extensor synergy, 0 at the ankle. Patient has Ashworth grade 2 spasticity of the right hamstrings does have contracture of the right knee 10-15%  Nursing note and vitals reviewed.  Skin shows no evidence of breakdown. Good hygiene in the right hand and elbow crease       Assessment & Plan:  1. 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 It is discussed with patient and wife do not expect any further functional or neurologic  improvement. Because of his severe spasticity. He is at risk for contracture. We went over the goal of spasticity injections, which is mainly for hygiene and positioning. We also discussed the specifics of stretching and I called in the caretaker and discussed with patient and his wife as well. We went over pectoralis stretching, biceps stretching, wrist flexor stretching and finger flexor stretching on the right hand  We'll repeat current dose of Dysport in 4 weeks Pectoralis 600 units Biceps 200 units Pronator teres, 200 units FCU, 100 units FCR, 200 units FDS, 100 units FDP 100 units  Over half of the 25 min visit was spent counseling and coordinating care.

## 2016-08-26 DIAGNOSIS — R3914 Feeling of incomplete bladder emptying: Secondary | ICD-10-CM | POA: Diagnosis not present

## 2016-08-26 DIAGNOSIS — R3982 Chronic bladder pain: Secondary | ICD-10-CM | POA: Diagnosis not present

## 2016-08-31 DIAGNOSIS — R3914 Feeling of incomplete bladder emptying: Secondary | ICD-10-CM | POA: Diagnosis not present

## 2016-08-31 DIAGNOSIS — N481 Balanitis: Secondary | ICD-10-CM | POA: Diagnosis not present

## 2016-09-07 DIAGNOSIS — Z85828 Personal history of other malignant neoplasm of skin: Secondary | ICD-10-CM | POA: Diagnosis not present

## 2016-09-07 DIAGNOSIS — L570 Actinic keratosis: Secondary | ICD-10-CM | POA: Diagnosis not present

## 2016-09-07 DIAGNOSIS — L308 Other specified dermatitis: Secondary | ICD-10-CM | POA: Diagnosis not present

## 2016-09-21 DIAGNOSIS — R3914 Feeling of incomplete bladder emptying: Secondary | ICD-10-CM | POA: Diagnosis not present

## 2016-09-22 ENCOUNTER — Encounter: Payer: Self-pay | Admitting: Physical Medicine & Rehabilitation

## 2016-09-22 ENCOUNTER — Encounter: Payer: Medicare Other | Attending: Physical Medicine & Rehabilitation

## 2016-09-22 ENCOUNTER — Ambulatory Visit: Payer: Medicare Other | Admitting: Physical Medicine & Rehabilitation

## 2016-09-22 VITALS — BP 137/68 | HR 63

## 2016-09-22 DIAGNOSIS — G8111 Spastic hemiplegia affecting right dominant side: Secondary | ICD-10-CM | POA: Diagnosis not present

## 2016-09-22 DIAGNOSIS — I6350 Cerebral infarction due to unspecified occlusion or stenosis of unspecified cerebral artery: Secondary | ICD-10-CM | POA: Insufficient documentation

## 2016-09-22 DIAGNOSIS — IMO0002 Reserved for concepts with insufficient information to code with codable children: Secondary | ICD-10-CM

## 2016-09-22 DIAGNOSIS — I639 Cerebral infarction, unspecified: Secondary | ICD-10-CM | POA: Diagnosis not present

## 2016-09-22 NOTE — Patient Instructions (Signed)

## 2016-09-22 NOTE — Progress Notes (Signed)
Dysport Injection for spasticity using needle EMG guidance  Dilution: 200 Units/ml Indication: Severe spasticity which interferes with ADL,mobility and/or  hygiene and is unresponsive to medication management and other conservative care Informed consent was obtained after describing risks and benefits of the procedure with the patient. This includes bleeding, bruising, infection, excessive weakness, or medication side effects. A REMS form is on file and signed. Needle:  needle electrode Number of units per muscle Pectoralis 600 units Biceps 200 units FDS, 200 units FDP 200 units All injections were done after obtaining appropriate EMG activity and after negative drawback for blood. The patient tolerated the except with positioning the arm for FCR/FCU injection which he did not tolerate Post procedure instructions were given. A followup appointment was made.

## 2016-10-09 DIAGNOSIS — C44329 Squamous cell carcinoma of skin of other parts of face: Secondary | ICD-10-CM | POA: Diagnosis not present

## 2016-10-09 DIAGNOSIS — Z85828 Personal history of other malignant neoplasm of skin: Secondary | ICD-10-CM | POA: Diagnosis not present

## 2016-10-09 DIAGNOSIS — L308 Other specified dermatitis: Secondary | ICD-10-CM | POA: Diagnosis not present

## 2016-10-15 DIAGNOSIS — R310 Gross hematuria: Secondary | ICD-10-CM | POA: Diagnosis not present

## 2016-10-15 DIAGNOSIS — C641 Malignant neoplasm of right kidney, except renal pelvis: Secondary | ICD-10-CM | POA: Diagnosis not present

## 2016-10-15 DIAGNOSIS — C61 Malignant neoplasm of prostate: Secondary | ICD-10-CM | POA: Diagnosis not present

## 2016-10-15 DIAGNOSIS — R3914 Feeling of incomplete bladder emptying: Secondary | ICD-10-CM | POA: Diagnosis not present

## 2016-11-11 DIAGNOSIS — N289 Disorder of kidney and ureter, unspecified: Secondary | ICD-10-CM | POA: Diagnosis not present

## 2016-11-11 DIAGNOSIS — N320 Bladder-neck obstruction: Secondary | ICD-10-CM | POA: Diagnosis not present

## 2016-11-12 DIAGNOSIS — R338 Other retention of urine: Secondary | ICD-10-CM | POA: Diagnosis not present

## 2016-11-12 DIAGNOSIS — R310 Gross hematuria: Secondary | ICD-10-CM | POA: Diagnosis not present

## 2016-11-25 DIAGNOSIS — R410 Disorientation, unspecified: Secondary | ICD-10-CM | POA: Diagnosis not present

## 2016-11-25 DIAGNOSIS — F0150 Vascular dementia without behavioral disturbance: Secondary | ICD-10-CM | POA: Diagnosis not present

## 2016-12-04 DIAGNOSIS — C641 Malignant neoplasm of right kidney, except renal pelvis: Secondary | ICD-10-CM | POA: Diagnosis not present

## 2016-12-04 DIAGNOSIS — R310 Gross hematuria: Secondary | ICD-10-CM | POA: Diagnosis not present

## 2016-12-04 DIAGNOSIS — C61 Malignant neoplasm of prostate: Secondary | ICD-10-CM | POA: Diagnosis not present

## 2016-12-24 ENCOUNTER — Telehealth: Payer: Self-pay

## 2016-12-24 NOTE — Telephone Encounter (Signed)
Ms. Holland called and left message today, stated that Mr [name] mental status has diminished since his last visit with [place] for the botox shots

## 2016-12-25 NOTE — Telephone Encounter (Signed)
Not Botox related needs to see PCP or Neurologist

## 2016-12-25 NOTE — Telephone Encounter (Signed)
It looks like patient did go and see his PCP on 11/25/16 in reguards to increased confusion so this will be an FYI encounter for in reguards to his next appointment with [place].

## 2017-01-06 DIAGNOSIS — R310 Gross hematuria: Secondary | ICD-10-CM | POA: Diagnosis not present

## 2017-01-06 DIAGNOSIS — C641 Malignant neoplasm of right kidney, except renal pelvis: Secondary | ICD-10-CM | POA: Diagnosis not present

## 2017-01-06 DIAGNOSIS — C61 Malignant neoplasm of prostate: Secondary | ICD-10-CM | POA: Diagnosis not present

## 2017-01-10 ENCOUNTER — Inpatient Hospital Stay (HOSPITAL_COMMUNITY)
Admission: EM | Admit: 2017-01-10 | Discharge: 2017-01-24 | DRG: 871 | Disposition: E | Payer: Medicare Other | Attending: Family Medicine | Admitting: Family Medicine

## 2017-01-10 ENCOUNTER — Emergency Department (HOSPITAL_COMMUNITY): Payer: Medicare Other

## 2017-01-10 ENCOUNTER — Inpatient Hospital Stay (HOSPITAL_COMMUNITY): Payer: Medicare Other

## 2017-01-10 ENCOUNTER — Encounter (HOSPITAL_COMMUNITY): Payer: Self-pay | Admitting: Emergency Medicine

## 2017-01-10 DIAGNOSIS — Z87891 Personal history of nicotine dependence: Secondary | ICD-10-CM

## 2017-01-10 DIAGNOSIS — Z8546 Personal history of malignant neoplasm of prostate: Secondary | ICD-10-CM | POA: Diagnosis not present

## 2017-01-10 DIAGNOSIS — E46 Unspecified protein-calorie malnutrition: Secondary | ICD-10-CM | POA: Diagnosis not present

## 2017-01-10 DIAGNOSIS — N179 Acute kidney failure, unspecified: Secondary | ICD-10-CM

## 2017-01-10 DIAGNOSIS — T83518A Infection and inflammatory reaction due to other urinary catheter, initial encounter: Secondary | ICD-10-CM | POA: Diagnosis present

## 2017-01-10 DIAGNOSIS — N390 Urinary tract infection, site not specified: Secondary | ICD-10-CM | POA: Diagnosis not present

## 2017-01-10 DIAGNOSIS — E876 Hypokalemia: Secondary | ICD-10-CM | POA: Diagnosis not present

## 2017-01-10 DIAGNOSIS — R911 Solitary pulmonary nodule: Secondary | ICD-10-CM | POA: Diagnosis present

## 2017-01-10 DIAGNOSIS — H9190 Unspecified hearing loss, unspecified ear: Secondary | ICD-10-CM | POA: Diagnosis present

## 2017-01-10 DIAGNOSIS — Z8249 Family history of ischemic heart disease and other diseases of the circulatory system: Secondary | ICD-10-CM | POA: Diagnosis not present

## 2017-01-10 DIAGNOSIS — I69351 Hemiplegia and hemiparesis following cerebral infarction affecting right dominant side: Secondary | ICD-10-CM | POA: Diagnosis not present

## 2017-01-10 DIAGNOSIS — R400 Somnolence: Secondary | ICD-10-CM | POA: Diagnosis not present

## 2017-01-10 DIAGNOSIS — R739 Hyperglycemia, unspecified: Secondary | ICD-10-CM | POA: Diagnosis present

## 2017-01-10 DIAGNOSIS — J181 Lobar pneumonia, unspecified organism: Secondary | ICD-10-CM | POA: Diagnosis not present

## 2017-01-10 DIAGNOSIS — D72829 Elevated white blood cell count, unspecified: Secondary | ICD-10-CM | POA: Diagnosis not present

## 2017-01-10 DIAGNOSIS — Z66 Do not resuscitate: Secondary | ICD-10-CM | POA: Diagnosis present

## 2017-01-10 DIAGNOSIS — Z6822 Body mass index (BMI) 22.0-22.9, adult: Secondary | ICD-10-CM

## 2017-01-10 DIAGNOSIS — E872 Acidosis: Secondary | ICD-10-CM | POA: Diagnosis present

## 2017-01-10 DIAGNOSIS — R404 Transient alteration of awareness: Secondary | ICD-10-CM | POA: Diagnosis not present

## 2017-01-10 DIAGNOSIS — J189 Pneumonia, unspecified organism: Secondary | ICD-10-CM | POA: Diagnosis not present

## 2017-01-10 DIAGNOSIS — F0150 Vascular dementia without behavioral disturbance: Secondary | ICD-10-CM | POA: Diagnosis present

## 2017-01-10 DIAGNOSIS — L8990 Pressure ulcer of unspecified site, unspecified stage: Secondary | ICD-10-CM | POA: Insufficient documentation

## 2017-01-10 DIAGNOSIS — A419 Sepsis, unspecified organism: Principal | ICD-10-CM

## 2017-01-10 DIAGNOSIS — J479 Bronchiectasis, uncomplicated: Secondary | ICD-10-CM | POA: Diagnosis not present

## 2017-01-10 DIAGNOSIS — Y846 Urinary catheterization as the cause of abnormal reaction of the patient, or of later complication, without mention of misadventure at the time of the procedure: Secondary | ICD-10-CM | POA: Diagnosis present

## 2017-01-10 DIAGNOSIS — J690 Pneumonitis due to inhalation of food and vomit: Secondary | ICD-10-CM | POA: Diagnosis not present

## 2017-01-10 DIAGNOSIS — E874 Mixed disorder of acid-base balance: Secondary | ICD-10-CM | POA: Diagnosis present

## 2017-01-10 DIAGNOSIS — L89151 Pressure ulcer of sacral region, stage 1: Secondary | ICD-10-CM | POA: Diagnosis present

## 2017-01-10 DIAGNOSIS — R079 Chest pain, unspecified: Secondary | ICD-10-CM | POA: Diagnosis not present

## 2017-01-10 DIAGNOSIS — R0902 Hypoxemia: Secondary | ICD-10-CM

## 2017-01-10 DIAGNOSIS — R4182 Altered mental status, unspecified: Secondary | ICD-10-CM | POA: Diagnosis not present

## 2017-01-10 DIAGNOSIS — E860 Dehydration: Secondary | ICD-10-CM | POA: Diagnosis not present

## 2017-01-10 DIAGNOSIS — B965 Pseudomonas (aeruginosa) (mallei) (pseudomallei) as the cause of diseases classified elsewhere: Secondary | ICD-10-CM | POA: Diagnosis present

## 2017-01-10 DIAGNOSIS — I639 Cerebral infarction, unspecified: Secondary | ICD-10-CM | POA: Diagnosis not present

## 2017-01-10 HISTORY — DX: Pneumonia, unspecified organism: J18.9

## 2017-01-10 LAB — CBC WITH DIFFERENTIAL/PLATELET
Basophils Absolute: 0 10*3/uL (ref 0.0–0.1)
Basophils Absolute: 0 10*3/uL (ref 0.0–0.1)
Basophils Relative: 0 %
Basophils Relative: 0 %
EOS ABS: 0 10*3/uL (ref 0.0–0.7)
EOS PCT: 0 %
Eosinophils Absolute: 0 10*3/uL (ref 0.0–0.7)
Eosinophils Relative: 0 %
HCT: 35.8 % — ABNORMAL LOW (ref 39.0–52.0)
HEMATOCRIT: 45.3 % (ref 39.0–52.0)
HEMOGLOBIN: 14.8 g/dL (ref 13.0–17.0)
Hemoglobin: 11.4 g/dL — ABNORMAL LOW (ref 13.0–17.0)
LYMPHS ABS: 0.8 10*3/uL (ref 0.7–4.0)
LYMPHS PCT: 4 %
Lymphocytes Relative: 4 %
Lymphs Abs: 1.1 10*3/uL (ref 0.7–4.0)
MCH: 28 pg (ref 26.0–34.0)
MCH: 27.1 pg (ref 26.0–34.0)
MCHC: 32.7 g/dL (ref 30.0–36.0)
MCHC: 31.8 g/dL (ref 30.0–36.0)
MCV: 85.6 fL (ref 78.0–100.0)
MCV: 85.2 fL (ref 78.0–100.0)
MONO ABS: 2.2 10*3/uL — AB (ref 0.1–1.0)
MONOS PCT: 11 %
MONOS PCT: 10 %
Monocytes Absolute: 3.1 10*3/uL — ABNORMAL HIGH (ref 0.1–1.0)
NEUTROS ABS: 23.8 10*3/uL — AB (ref 1.7–7.7)
NEUTROS PCT: 85 %
Neutro Abs: 19.1 10*3/uL — ABNORMAL HIGH (ref 1.7–7.7)
Neutrophils Relative %: 86 %
PLATELETS: 334 10*3/uL (ref 150–400)
Platelets: 390 10*3/uL (ref 150–400)
RBC: 5.29 MIL/uL (ref 4.22–5.81)
RBC: 4.2 MIL/uL — AB (ref 4.22–5.81)
RDW: 15.2 % (ref 11.5–15.5)
RDW: 15.4 % (ref 11.5–15.5)
WBC: 28 10*3/uL — ABNORMAL HIGH (ref 4.0–10.5)
WBC: 22.1 10*3/uL — ABNORMAL HIGH (ref 4.0–10.5)

## 2017-01-10 LAB — I-STAT TROPONIN, ED
Troponin i, poc: 0.03 ng/mL (ref 0.00–0.08)
Troponin i, poc: 0.05 ng/mL (ref 0.00–0.08)

## 2017-01-10 LAB — I-STAT CG4 LACTIC ACID, ED
LACTIC ACID, VENOUS: 2.82 mmol/L — AB (ref 0.5–1.9)
Lactic Acid, Venous: 4.84 mmol/L (ref 0.5–1.9)

## 2017-01-10 LAB — TROPONIN I: TROPONIN I: 0.32 ng/mL — AB (ref <0.03)

## 2017-01-10 LAB — URINALYSIS, ROUTINE W REFLEX MICROSCOPIC
BILIRUBIN URINE: NEGATIVE
Glucose, UA: NEGATIVE mg/dL
KETONES UR: 5 mg/dL — AB
NITRITE: NEGATIVE
PH: 5 (ref 5.0–8.0)
Protein, ur: 100 mg/dL — AB
SPECIFIC GRAVITY, URINE: 1.019 (ref 1.005–1.030)

## 2017-01-10 LAB — LACTIC ACID, PLASMA
LACTIC ACID, VENOUS: 2.7 mmol/L — AB (ref 0.5–1.9)
Lactic Acid, Venous: 3.6 mmol/L (ref 0.5–1.9)

## 2017-01-10 LAB — COMPREHENSIVE METABOLIC PANEL
ALBUMIN: 2.8 g/dL — AB (ref 3.5–5.0)
ALK PHOS: 87 U/L (ref 38–126)
ALT: 18 U/L (ref 17–63)
AST: 22 U/L (ref 15–41)
Anion gap: 13 (ref 5–15)
BILIRUBIN TOTAL: 0.7 mg/dL (ref 0.3–1.2)
BUN: 48 mg/dL — AB (ref 6–20)
CALCIUM: 9.7 mg/dL (ref 8.9–10.3)
CO2: 25 mmol/L (ref 22–32)
CREATININE: 2.25 mg/dL — AB (ref 0.61–1.24)
Chloride: 102 mmol/L (ref 101–111)
GFR calc Af Amer: 28 mL/min — ABNORMAL LOW (ref >60)
GFR calc non Af Amer: 24 mL/min — ABNORMAL LOW (ref >60)
Glucose, Bld: 121 mg/dL — ABNORMAL HIGH (ref 65–99)
Potassium: 4.9 mmol/L (ref 3.5–5.1)
Sodium: 140 mmol/L (ref 135–145)
TOTAL PROTEIN: 6.8 g/dL (ref 6.5–8.1)

## 2017-01-10 LAB — PROCALCITONIN: Procalcitonin: 2.97 ng/mL

## 2017-01-10 LAB — APTT: aPTT: 38 seconds — ABNORMAL HIGH (ref 24–36)

## 2017-01-10 LAB — LIPASE, BLOOD: Lipase: 17 U/L (ref 11–51)

## 2017-01-10 LAB — PREALBUMIN: PREALBUMIN: 10.8 mg/dL — AB (ref 18–38)

## 2017-01-10 LAB — PROTIME-INR
INR: 1.48
Prothrombin Time: 18.1 seconds — ABNORMAL HIGH (ref 11.4–15.2)

## 2017-01-10 LAB — STREP PNEUMONIAE URINARY ANTIGEN: Strep Pneumo Urinary Antigen: NEGATIVE

## 2017-01-10 MED ORDER — CLOPIDOGREL BISULFATE 75 MG PO TABS: 75.0000 mg | ORAL_TABLET | Freq: Every day | ORAL | Status: DC

## 2017-01-10 MED ORDER — ALBUTEROL SULFATE (2.5 MG/3ML) 0.083% IN NEBU: 2.5000 mg | INHALATION_SOLUTION | Freq: Once | RESPIRATORY_TRACT | Status: DC

## 2017-01-10 MED ORDER — SODIUM CHLORIDE 0.9 % IV BOLUS (SEPSIS): 500.0000 mL | Freq: Once | INTRAVENOUS | Status: DC

## 2017-01-10 MED ORDER — FINASTERIDE 5 MG PO TABS: 5.0000 mg | ORAL_TABLET | Freq: Every day | ORAL | Status: DC

## 2017-01-10 MED ORDER — DIVALPROEX SODIUM 125 MG PO DR TAB
125.0000 mg | DELAYED_RELEASE_TABLET | Freq: Every day | ORAL | Status: DC
  Filled 2017-01-10 (×4): qty 1

## 2017-01-10 MED ORDER — IPRATROPIUM-ALBUTEROL 0.5-2.5 (3) MG/3ML IN SOLN
3.0000 mL | Freq: Once | RESPIRATORY_TRACT | Status: AC
  Administered 2017-01-10: 3 mL via RESPIRATORY_TRACT
  Filled 2017-01-10: qty 3

## 2017-01-10 MED ORDER — AZITHROMYCIN 500 MG IV SOLR
500.0000 mg | Freq: Once | INTRAVENOUS | Status: AC
  Administered 2017-01-10: 500 mg via INTRAVENOUS
  Filled 2017-01-10 (×2): qty 500

## 2017-01-10 MED ORDER — DEXTROSE 5 % IV SOLN
500.0000 mg | INTRAVENOUS | Status: DC
  Administered 2017-01-11: 500 mg via INTRAVENOUS
  Filled 2017-01-10 (×2): qty 500

## 2017-01-10 MED ORDER — ACETAMINOPHEN 325 MG PO TABS: 650.0000 mg | ORAL_TABLET | Freq: Four times a day (QID) | ORAL | Status: DC | PRN

## 2017-01-10 MED ORDER — DEXTROSE 5 % IV SOLN
1.0000 g | INTRAVENOUS | Status: DC
  Filled 2017-01-10: qty 10

## 2017-01-10 MED ORDER — SODIUM CHLORIDE 0.9% FLUSH
3.0000 mL | Freq: Two times a day (BID) | INTRAVENOUS | Status: DC
  Administered 2017-01-11 – 2017-01-13 (×4): 3 mL via INTRAVENOUS

## 2017-01-10 MED ORDER — VITAMIN D 1000 UNITS PO TABS: 1000.0000 [IU] | ORAL_TABLET | Freq: Every day | ORAL | Status: DC

## 2017-01-10 MED ORDER — SODIUM CHLORIDE 0.9% FLUSH: 3.0000 mL | INTRAVENOUS | Status: DC | PRN

## 2017-01-10 MED ORDER — RESOURCE THICKENUP CLEAR PO POWD
ORAL | Status: DC | PRN
  Filled 2017-01-10: qty 125

## 2017-01-10 MED ORDER — HEPARIN SODIUM (PORCINE) 5000 UNIT/ML IJ SOLN
5000.0000 [IU] | Freq: Three times a day (TID) | INTRAMUSCULAR | Status: DC
  Administered 2017-01-10 – 2017-01-13 (×7): 5000 [IU] via SUBCUTANEOUS
  Filled 2017-01-10 (×6): qty 1

## 2017-01-10 MED ORDER — ATORVASTATIN CALCIUM 20 MG PO TABS: 20.0000 mg | ORAL_TABLET | Freq: Every day | ORAL | Status: DC

## 2017-01-10 MED ORDER — RIVASTIGMINE 13.3 MG/24HR TD PT24
1.0000 | MEDICATED_PATCH | Freq: Every day | TRANSDERMAL | Status: DC
  Filled 2017-01-10: qty 1

## 2017-01-10 MED ORDER — SODIUM CHLORIDE 0.9 % IV BOLUS (SEPSIS)
1000.0000 mL | Freq: Once | INTRAVENOUS | Status: AC
  Administered 2017-01-10: 1000 mL via INTRAVENOUS

## 2017-01-10 MED ORDER — SODIUM CHLORIDE 0.9 % IV BOLUS (SEPSIS)
500.0000 mL | Freq: Once | INTRAVENOUS | Status: AC
  Administered 2017-01-11: 500 mL via INTRAVENOUS

## 2017-01-10 MED ORDER — DEXTROSE-NACL 5-0.9 % IV SOLN
INTRAVENOUS | Status: DC
  Administered 2017-01-10 – 2017-01-13 (×3): via INTRAVENOUS

## 2017-01-10 MED ORDER — FAMOTIDINE 20 MG PO TABS: 20.0000 mg | ORAL_TABLET | Freq: Two times a day (BID) | ORAL | Status: DC

## 2017-01-10 MED ORDER — RIVASTIGMINE 9.5 MG/24HR TD PT24: 9.5000 mg | MEDICATED_PATCH | Freq: Every day | TRANSDERMAL | Status: DC

## 2017-01-10 MED ORDER — SODIUM CHLORIDE 0.9 % IV SOLN
Freq: Once | INTRAVENOUS | Status: AC
  Administered 2017-01-10: 13:00:00 via INTRAVENOUS

## 2017-01-10 MED ORDER — SODIUM CHLORIDE 0.9 % IV SOLN: 250.0000 mL | INTRAVENOUS | Status: DC | PRN

## 2017-01-10 MED ORDER — BUPROPION HCL ER (XL) 150 MG PO TB24: 300.0000 mg | ORAL_TABLET | Freq: Every day | ORAL | Status: DC

## 2017-01-10 MED ORDER — DEXTROSE 5 % IV SOLN
1.0000 g | Freq: Once | INTRAVENOUS | Status: AC
  Administered 2017-01-10: 1 g via INTRAVENOUS
  Filled 2017-01-10 (×2): qty 10

## 2017-01-10 NOTE — ED Notes (Signed)
Code sepsis activated 11:28

## 2017-01-10 NOTE — ED Notes (Signed)
Family at bedside. 

## 2017-01-10 NOTE — H&P (Signed)
History and Physical    LENZY KERSCHNER [id number] DOB: Mar 27, 1926 DOA: 01/15/2017  Referring MD/NP/PA: EDP  PCP: Lajean Manes, MD  Outpatient Specialists:  Patient coming from: Home  Chief Complaint: altered mental status  HPI: [name] is a 83 y.o. male with medical history significant for but not limited to dementia and CVA with left sided weakness and contractures of the extremities, non-ambulatory, with 24 7 care at home presenting with mental status change for the last 3 days.  According to the wife patient has been lethargic and sleeping more than usual since Friday morning with decreased responsiveness has not been eating as much. Doesn't history of fever, no complaints of chest pain or shortness of breath but has been coughing a little without sputum production  ED Course: At the ED patient was mildly tachycardic with O2  saturation of 94% on room air lethargic. X-ray reviewed right lower lobe infiltrate with leukocytosis of 20,000 and elevated BUN/creatinine hematologic and metabolic indices. Code stroke was called with associated lactic acidosis and antibiotics initiated. Patient admitted for onward care   Review of Systems: Unable to provide  Past Medical History:  Diagnosis Date  . Cancer ([place])   . Hearing loss   . Stroke [facility])     Past Surgical History:  Procedure Laterality Date  . APPENDECTOMY    . KIDNEY SURGERY       reports that he has quit smoking. He has quit using smokeless tobacco. He reports that he does not drink alcohol or use drugs.  Allergies  Allergen Reactions  . Memantine Hcl Other (See Comments)    unspecified    Family History  Problem Relation Age of Onset  . CAD Father      Prior to Admission medications   Medication Sig Start Date End Date Taking? Authorizing Provider  atorvastatin (LIPITOR) 20 MG tablet Take 1 tablet (20 mg total) by mouth daily at 6 PM. Patient taking differently: Take 10 mg by mouth daily at 6 PM.  TAkes 1/2 tablet 07/30/14   Love, Ivan Anchors, PA-C  buPROPion (WELLBUTRIN XL) 150 MG 24 hr tablet Take 150 mg by mouth daily.    [provider]  clopidogrel (PLAVIX) 75 MG tablet Take 1 tablet (75 mg total) by mouth daily. 10/03/14   Sater, Nanine Means, MD  doxycycline (VIBRA-TABS) 100 MG tablet Take 100 mg by mouth 2 (two) times daily.    [provider]  fluocinonide ointment (LIDEX) 0.05 % Applies twice a day to scalp. 03/25/16   [provider]  ranitidine (ZANTAC) 150 MG tablet Take 150 mg by mouth 2 (two) times daily.    [provider]  rivastigmine (EXELON) 9.5 mg/24hr Place onto the skin. 10/25/15 10/24/16  [provider]  tamsulosin (FLOMAX) 0.4 MG CAPS capsule Take 0.4 mg by mouth at bedtime.     [provider]    Physical Exam: Vitals:   01/19/2017 1215 01/19/2017 1230 01/02/2017 1245 12/27/2016 1300  BP: (!) 142/57 (!) 143/57 (!) 143/61 (!) 141/66  Pulse: 94 96 96 94  Resp: 17 17 18 17   Temp:      TempSrc:      SpO2: 97% 97% 97% 97%      Constitutional: NAD, calm, comfortable Vitals:   01/06/2017 1215 01/22/2017 1230 01/18/2017 1245 01/19/2017 1300  BP: (!) 142/57 (!) 143/57 (!) 143/61 (!) 141/66  Pulse: 94 96 96 94  Resp: 17 17 18 17   Temp:  TempSrc:      SpO2: 97% 97% 97% 97%   Eyes: PERRL, lids and conjunctivae normal ENMT: Dry Mucous membranes. Posterior pharynx clear of any exudate or lesions  Neck: normal, supple, no masses, no thyromegaly Respiratory: Right basilar rales noted, no wheezing, no crackles. Normal respiratory effort. No accessory muscle use.  Cardiovascular: Regular rate and rhythm, no murmurs / rubs / gallops.   Abdomen: no tenderness, no masses palpated. No hepatosplenomegaly. Bowel sounds positive.  Extremities:  No cyanosis, no edema, (+) contractures. Skin: no rashes Neurologic: Lethargic but arrousable, does not follow simple commands,  left sided weakness noted  .  Labs on Admission: I have  personally reviewed following labs and imaging studies  CBC:  Recent Labs Lab 01/22/2017 1047  WBC 28.0*  NEUTROABS 23.8*  HGB 14.8  HCT 45.3  MCV 85.6  PLT 284   Basic Metabolic Panel:  Recent Labs Lab 01/05/2017 1047  NA 140  K 4.9  CL 102  CO2 25  GLUCOSE 121*  BUN 48*  CREATININE 2.25*  CALCIUM 9.7   GFR: CrCl cannot be calculated (Unknown ideal weight.). Liver Function Tests:  Recent Labs Lab 12/25/2016 1047  AST 22  ALT 18  ALKPHOS 87  BILITOT 0.7  PROT 6.8  ALBUMIN 2.8*    Recent Labs Lab 01/02/2017 1047  LIPASE 17   No results for input(s): AMMONIA in the last 168 hours. Coagulation Profile: No results for input(s): INR, PROTIME in the last 168 hours. Cardiac Enzymes: No results for input(s): CKTOTAL, CKMB, CKMBINDEX, TROPONINI in the last 168 hours. BNP (last 3 results) No results for input(s): PROBNP in the last 8760 hours. HbA1C: No results for input(s): HGBA1C in the last 72 hours. CBG: No results for input(s): GLUCAP in the last 168 hours. Lipid Profile: No results for input(s): CHOL, HDL, LDLCALC, TRIG, CHOLHDL, LDLDIRECT in the last 72 hours. Thyroid Function Tests: No results for input(s): TSH, T4TOTAL, FREET4, T3FREE, THYROIDAB in the last 72 hours. Anemia Panel: No results for input(s): VITAMINB12, FOLATE, FERRITIN, TIBC, IRON, RETICCTPCT in the last 72 hours. Urine analysis:    Component Value Date/Time   COLORURINE YELLOW 07/16/2015 1028   APPEARANCEUR CLOUDY (A) 07/16/2015 1028   LABSPEC 1.017 07/16/2015 1028   PHURINE 7.5 07/16/2015 1028   GLUCOSEU NEGATIVE 07/16/2015 1028   HGBUR NEGATIVE 07/16/2015 1028   BILIRUBINUR NEGATIVE 07/16/2015 1028   KETONESUR NEGATIVE 07/16/2015 1028   PROTEINUR NEGATIVE 07/16/2015 1028   UROBILINOGEN 0.2 12/12/2014 1311   NITRITE NEGATIVE 07/16/2015 1028   LEUKOCYTESUR LARGE (A) 07/16/2015 1028   Sepsis Labs: @LABRCNTIP (procalcitonin:4,lacticidven:4) )No results found for this or any  previous visit (from the past 240 hour(s)).   Radiological Exams on Admission: Dg Chest 2 View  Result Date: 12/27/2016 CLINICAL DATA:  Altered mental status. EXAM: CHEST  2 VIEW COMPARISON:  07/24/2016 FINDINGS: Shallow lung inflation. Heart size is normal. Aorta is tortuous. Patchy infiltrate is identified at the right lung base. There is mild prominence of interstitial markings which may be chronic. There are biapical pleuroparenchymal changes. There is a wedge compression fracture of L3. This is new since 12/12/2014 wedge compression fracture of T8, likely remote. IMPRESSION: Right lower lobe infiltrate. Fracture at L3 is new since 2016 but of indeterminate age. Remote wedge fracture of T8. Electronically Signed   By: Nolon Nations M.D.   On: 12/29/2016 10:47   Ct Head Wo Contrast  Result Date: 01/18/2017 CLINICAL DATA:  The patient is lethargic. Right-sided deficits from previous  stroke. EXAM: CT HEAD WITHOUT CONTRAST TECHNIQUE: Contiguous axial images were obtained from the base of the skull through the vertex without intravenous contrast. COMPARISON:  July 16, 2015 FINDINGS: Brain: No subdural, epidural, or subarachnoid hemorrhage. Severe white matter changes are stable. No acute cortical ischemia or infarct. Ventricles and sulci are prominent but stable. Cerebellum, brainstem, and basal cisterns are unremarkable. No mass effect or midline shift. Vascular: Calcified atherosclerosis is seen in the intracranial carotid arteries. Skull: Normal. Negative for fracture or focal lesion. Sinuses/Orbits: Mild mucosal thickening in the maxillary sinuses with mucus posteriorly in the right sphenoid sinus. Paranasal sinuses, mastoid air cells, and middle ears are otherwise normal. Other: None. IMPRESSION: 1. No acute intracranial process.  Chronic white matter changes. Electronically Signed   By: Dorise Bullion III M.D   On: 01/20/2017 10:59   Dg Abd 2 Views  Result Date: 01/23/2017 CLINICAL DATA:   Altered mental state. EXAM: ABDOMEN - 2 VIEW COMPARISON:  12/27/2014 FINDINGS: The bowel gas pattern is normal. There is no evidence of free air. Seed implants noted within the prostate gland. IMPRESSION: No abnormal bowel dilatation. Electronically Signed   By: Kerby Moors M.D.   On: 01/18/2017 10:39    EKG: Independently reviewed.  Assessment/Plan Active Problems:   Lower urinary tract infectious disease   CAP (community acquired pneumonia)   Sepsis ([place])   Dehydration   Acute renal failure (ARF) (H[facility])   Altered mental status   Protein calorie malnutrition ([facility])   Hyperglycemia   Leukocytosis     #1 Sepsis: Pulmonary source Ucx Bld Cx F/u lactic acid levels Troponin  #2 Community Acquired Pneumonia: Supp O2 Pulmonary toiletting as needed Antibiotic  #3 Acute renal Failure: Admit BUN/Cr 48/2.25 Baseline Cr @1 .00 Avoid/hold nephrotoxic Monitor renal fxn with electrolytes   #4 Dehydration: Due to poor oral intake  IV Rehydration with monitoring of I/O  #5 Mental Status Change: Infectious and Toxic-Metabolite etiology due to above diagnoses Supportive care  #6 Hyperglycemia: No prior hx of DM Check A1C  #7 Hypoalbuminemia: Check prealbumin Nutrition consult when more stable   DVT prophylaxis: (Heparin) Code Status: (DNR) Family Communication: Wife at [employer]) Disposition Plan: (To be determined) Consults called:  Admission status: (inpatient / SDU)   OSEI-BONSU,Adonna Horsley MD [facility] Pager [phone]  If 7PM-7AM, please contact night-coverage [email or web address] Password [email or web address]  01/09/2017, 1:49 PM

## 2017-01-10 NOTE — ED Provider Notes (Signed)
[place] DEPT Provider Note   CSN: [id number] Arrival date & time: 01/07/2017  0844     History   Chief Complaint No chief complaint on file.   HPI [name] is a 83 y.o. male.  HPI [name] is a 83 y.o. male presents to emergency department with altered level of consciousness and generalized weakness. Patient lives at home with his wife. He does have history of dementia, CVA with right sided paralysis, anemia, prior sepsis. He does not walk. He has 24/7 caretaker. According to the wife, patient became increasingly weak 2 days ago. He is sleeping throughout the day which he normally does not do. He is not hungry and eating as much. He has had several episodes where he "spit out." She states that he is normally confused but has been more confused than usual. He has been complaining of pain to his sacrum. Also complaining of pain to left arm where he sustained skin tears several days ago. Wife states that he has not been sick otherwise recently. He has not had any falls. He has not had any fever. No other complaints.  Past Medical History:  Diagnosis Date  . Cancer ([place])   . Hearing loss   . Stroke [facility])     Patient Active Problem List   Diagnosis Date Noted  . Contracture of joint of right shoulder region 08/25/2016  . Contracture of elbow joint, right 08/25/2016  . Contracture of right wrist 08/25/2016  . Flexion contracture of joint of hand 12/27/2015  . Spastic hemiplegia of right dominant side due to infarction of brain ([place]) 05/31/2015  . Sepsis ([place]) 10/08/2014  . Acute encephalopathy 10/08/2014  . UTI (lower urinary tract infection) 10/08/2014  . Elevated troponin 10/08/2014  . Right hemiparesis ([place]) 07/05/2014  . Dysphagia, post-stroke 07/05/2014  . Dysarthria due to recent cerebral infarction 07/05/2014  . Left pontine stroke ([place]) 07/02/2014  . Embolic stroke involving left carotid artery ([place]) 06/28/2014  . Stroke ([place]) 06/28/2014  . CAP  (community acquired pneumonia) 06/28/2014  . Normocytic anemia 06/28/2014  . Essential hypertension 06/28/2014  . Hypokalemia 06/28/2014    Past Surgical History:  Procedure Laterality Date  . APPENDECTOMY    . KIDNEY SURGERY         Home Medications    Prior to Admission medications   Medication Sig Start Date End Date Taking? Authorizing Provider  atorvastatin (LIPITOR) 20 MG tablet Take 1 tablet (20 mg total) by mouth daily at 6 PM. Patient taking differently: Take 10 mg by mouth daily at 6 PM. TAkes 1/2 tablet 07/30/14   Love, Ivan Anchors, PA-C  buPROPion (WELLBUTRIN XL) 150 MG 24 hr tablet Take 150 mg by mouth daily.    [provider]  clopidogrel (PLAVIX) 75 MG tablet Take 1 tablet (75 mg total) by mouth daily. 10/03/14   Sater, Nanine Means, MD  doxycycline (VIBRA-TABS) 100 MG tablet Take 100 mg by mouth 2 (two) times daily.    [provider]  fluocinonide ointment (LIDEX) 0.05 % Applies twice a day to scalp. 03/25/16   [provider]  ranitidine (ZANTAC) 150 MG tablet Take 150 mg by mouth 2 (two) times daily.    [provider]  rivastigmine (EXELON) 9.5 mg/24hr Place onto the skin. 10/25/15 10/24/16  [provider]  tamsulosin (FLOMAX) 0.4 MG CAPS capsule Take 0.4 mg by mouth at bedtime.     [provider]    Family History Family History  Problem Relation  Age of Onset  . CAD Father     Social History Social History  Substance Use Topics  . Smoking status: Former Research scientist (life sciences)  . Smokeless tobacco: Former Systems developer  . Alcohol use No     Comment: Occasionally     Allergies   Patient has no known allergies.   Review of Systems Review of Systems  Unable to perform ROS: Mental status change     Physical Exam Updated Vital Signs BP (!) 122/59 (BP Location: Right Arm)   Pulse (!) 101   Temp 98.3 F (36.8 C) (Axillary)   Resp 20   SpO2 94% Comment: O2  Physical Exam  Constitutional: He appears well-developed and  well-nourished. No distress.  HENT:  Head: Normocephalic and atraumatic.  Eyes: Conjunctivae are normal.  Pin point pupils  Neck: Normal range of motion. Neck supple.  Cardiovascular: Normal rate, regular rhythm and normal heart sounds.   Pulmonary/Chest: Effort normal. No respiratory distress. He has no wheezes. He has rales.  Abdominal: Soft. Bowel sounds are normal. He exhibits no distension. There is no tenderness. There is no rebound.  Musculoskeletal: He exhibits no edema.  Right arm contracted, left arm decreased grip strength, able to hold his arm up, strength is decreased. Unable to move bilateral legs up  Neurological: He is alert.  Oriented to self  Skin: Skin is warm and dry.  Erythema around her sacrum, no open skin. Skin tears to the left dorsal hand and left forearm. Skin tear to the left forearm with mild purulent drainage. No surrounding cellulitic changes  Nursing note and vitals reviewed.    ED Treatments / Results  Labs (all labs ordered are listed, but only abnormal results are displayed) Labs Reviewed  URINE CULTURE - Abnormal; Notable for the following:       Result Value   Culture   (*)    Value: >=100,000 COLONIES/mL PSEUDOMONAS AERUGINOSA SUSCEPTIBILITIES TO FOLLOW    All other components within normal limits  URINE CULTURE - Abnormal; Notable for the following:    Culture >=100,000 COLONIES/mL GRAM NEGATIVE RODS (*)    All other components within normal limits  CBC WITH DIFFERENTIAL/PLATELET - Abnormal; Notable for the following:    WBC 28.0 (*)    Neutro Abs 23.8 (*)    Monocytes Absolute 3.1 (*)    All other components within normal limits  COMPREHENSIVE METABOLIC PANEL - Abnormal; Notable for the following:    Glucose, Bld 121 (*)    BUN 48 (*)    Creatinine, Ser 2.25 (*)    Albumin 2.8 (*)    GFR calc non Af Amer 24 (*)    GFR calc Af Amer 28 (*)    All other components within normal limits  URINALYSIS, ROUTINE W REFLEX MICROSCOPIC -  Abnormal; Notable for the following:    APPearance TURBID (*)    Hgb urine dipstick MODERATE (*)    Ketones, ur 5 (*)    Protein, ur 100 (*)    Leukocytes, UA MODERATE (*)    Bacteria, UA RARE (*)    Squamous Epithelial / LPF 0-5 (*)    All other components within normal limits  PREALBUMIN - Abnormal; Notable for the following:    Prealbumin 10.8 (*)    All other components within normal limits  COMPREHENSIVE METABOLIC PANEL - Abnormal; Notable for the following:    Potassium 3.3 (*)    Chloride 115 (*)    CO2 17 (*)    Glucose, Bld 124 (*)  BUN 30 (*)    Calcium 7.9 (*)    Total Protein 4.8 (*)    Albumin 1.9 (*)    AST 13 (*)    ALT 12 (*)    GFR calc non Af Amer 54 (*)    All other components within normal limits  CBC WITH DIFFERENTIAL/PLATELET - Abnormal; Notable for the following:    WBC 16.1 (*)    RBC 3.59 (*)    Hemoglobin 9.7 (*)    HCT 30.4 (*)    Neutro Abs 13.6 (*)    Lymphs Abs 0.5 (*)    Monocytes Absolute 1.9 (*)    All other components within normal limits  CBC WITH DIFFERENTIAL/PLATELET - Abnormal; Notable for the following:    WBC 22.1 (*)    RBC 4.20 (*)    Hemoglobin 11.4 (*)    HCT 35.8 (*)    Neutro Abs 19.1 (*)    Monocytes Absolute 2.2 (*)    All other components within normal limits  LACTIC ACID, PLASMA - Abnormal; Notable for the following:    Lactic Acid, Venous 2.7 (*)    All other components within normal limits  LACTIC ACID, PLASMA - Abnormal; Notable for the following:    Lactic Acid, Venous 3.6 (*)    All other components within normal limits  PROTIME-INR - Abnormal; Notable for the following:    Prothrombin Time 18.1 (*)    All other components within normal limits  APTT - Abnormal; Notable for the following:    aPTT 38 (*)    All other components within normal limits  TROPONIN I - Abnormal; Notable for the following:    Troponin I 0.32 (*)    All other components within normal limits  TROPONIN I - Abnormal; Notable for the  following:    Troponin I 0.03 (*)    All other components within normal limits  TROPONIN I - Abnormal; Notable for the following:    Troponin I 0.03 (*)    All other components within normal limits  BLOOD GAS, ARTERIAL - Abnormal; Notable for the following:    pH, Arterial 7.466 (*)    pCO2 arterial 28.1 (*)    pO2, Arterial 55.9 (*)    Bicarbonate 19.8 (*)    Acid-base deficit 3.2 (*)    All other components within normal limits  I-STAT CG4 LACTIC ACID, ED - Abnormal; Notable for the following:    Lactic Acid, Venous 4.84 (*)    All other components within normal limits  I-STAT CG4 LACTIC ACID, ED - Abnormal; Notable for the following:    Lactic Acid, Venous 2.82 (*)    All other components within normal limits  CULTURE, BLOOD (ROUTINE X 2)  CULTURE, BLOOD (ROUTINE X 2)  CULTURE, EXPECTORATED SPUTUM-ASSESSMENT  GRAM STAIN  LIPASE, BLOOD  HEMOGLOBIN A1C  HIV ANTIBODY (ROUTINE TESTING)  STREP PNEUMONIAE URINARY ANTIGEN  PROCALCITONIN  TROPONIN I  LACTIC ACID, PLASMA  LACTIC ACID, PLASMA  CBC  I-STAT TROPOININ, ED  I-STAT TROPOININ, ED    EKG  EKG Interpretation  Date/Time:  Sunday January 10 2017 09:07:41 EDT Ventricular Rate:  101 PR Interval:    QRS Duration: 128 QT Interval:  355 QTC Calculation: 461 R Axis:   -55 Text Interpretation:  Sinus tachycardia Nonspecific IVCD with LAD No significant change since last tracing Confirmed by Deno Etienne [phone]) on 01/11/2017 12:09:35 PM       Radiology Dg Chest 2 View  Result Date: 12/30/2016 CLINICAL  DATA:  Altered mental status. EXAM: CHEST  2 VIEW COMPARISON:  07/24/2016 FINDINGS: Shallow lung inflation. Heart size is normal. Aorta is tortuous. Patchy infiltrate is identified at the right lung base. There is mild prominence of interstitial markings which may be chronic. There are biapical pleuroparenchymal changes. There is a wedge compression fracture of L3. This is new since 12/12/2014 wedge compression fracture of T8,  likely remote. IMPRESSION: Right lower lobe infiltrate. Fracture at L3 is new since 2016 but of indeterminate age. Remote wedge fracture of T8. Electronically Signed   By: Nolon Nations M.D.   On: 01/09/2017 10:47   Ct Head Wo Contrast  Result Date: 01/04/2017 CLINICAL DATA:  The patient is lethargic. Right-sided deficits from previous stroke. EXAM: CT HEAD WITHOUT CONTRAST TECHNIQUE: Contiguous axial images were obtained from the base of the skull through the vertex without intravenous contrast. COMPARISON:  July 16, 2015 FINDINGS: Brain: No subdural, epidural, or subarachnoid hemorrhage. Severe white matter changes are stable. No acute cortical ischemia or infarct. Ventricles and sulci are prominent but stable. Cerebellum, brainstem, and basal cisterns are unremarkable. No mass effect or midline shift. Vascular: Calcified atherosclerosis is seen in the intracranial carotid arteries. Skull: Normal. Negative for fracture or focal lesion. Sinuses/Orbits: Mild mucosal thickening in the maxillary sinuses with mucus posteriorly in the right sphenoid sinus. Paranasal sinuses, mastoid air cells, and middle ears are otherwise normal. Other: None. IMPRESSION: 1. No acute intracranial process.  Chronic white matter changes. Electronically Signed   By: Dorise Bullion III M.D   On: 01/21/2017 10:59   Ct Chest Wo Contrast  Result Date: 01/11/2017 CLINICAL DATA:  Acute onset of hypoxia.  Initial encounter. EXAM: CT CHEST WITHOUT CONTRAST TECHNIQUE: Multidetector CT imaging of the chest was performed following the standard protocol without IV contrast. COMPARISON:  Chest radiograph performed 01/11/2017 FINDINGS: Cardiovascular: Diffuse coronary artery calcifications are seen. Scattered calcification is seen along the thoracic aorta. The great vessels are grossly unremarkable in appearance. The heart remains normal in size. Mediastinum/Nodes: The mid esophagus is not well assessed, with vague prominent soft tissue  density. An underlying mass cannot be entirely excluded. No mediastinal lymphadenopathy is seen. No pericardial effusion is identified. The thyroid gland is unremarkable appearance. No axillary lymphadenopathy is appreciated. Lungs/Pleura: Dense right lower lobe pneumonia is noted, with associated air bronchograms. Additional patchy right upper lobe and left lower lobe airspace opacities are seen. Small bilateral pleural effusions are noted. No pneumothorax is seen. [place] An 8 mm nodule is noted at the right lung apex. Underlying bronchiectasis is noted at the upper lung lobes. Scarring and calcification are noted at the lung apices bilaterally. Upper Abdomen: The visualized portions of the liver and spleen are grossly unremarkable. Musculoskeletal: No acute osseous abnormalities are identified. Mild degenerative change is noted along the cervical spine. The visualized musculature is unremarkable in appearance. IMPRESSION: 1. Dense right lower lobe pneumonia, with associated air bronchograms. Additional patchy right upper lobe and left lower lobe pneumonia noted. 2. Small bilateral pleural effusions seen. 3. **An incidental finding of potential clinical significance has been found. 8 mm nodule noted at the right lung apex. Non-contrast chest CT at 6-12 months is recommended. If the nodule is stable at time of repeat CT, then future CT at 18-24 months (from today's scan) is considered optional for low-risk patients, but is recommended for high-risk patients. This recommendation follows the consensus statement: Guidelines for Management of Incidental Pulmonary Nodules Detected on CT Images: From the Fleischner Society 2017; Radiology  2017; 177:939-030.** 4. Bronchiectasis at the upper lung lobes. Scarring and calcification at the lung apices. 5. Diffuse coronary artery calcifications seen. 6. Mid esophagus is not well assessed, with vague prominent soft tissue density. An underlying mass cannot be entirely excluded.  Would correlate for any associated symptoms, and consider endoscopy for further evaluation. Electronically Signed   By: Garald Balding M.D.   On: 01/11/2017 22:42   Dg Chest Port 1 View  Result Date: 01/15/2017 CLINICAL DATA:  Sepsis. EXAM: PORTABLE CHEST 1 VIEW COMPARISON:  January 10, 2017 FINDINGS: The left lung is clear. Patchy infiltrate in the right lung, particularly the right base, is more prominent the interval. No other interval changes. IMPRESSION: Patchy infiltrate in the right lung, particularly the right base, is more prominent in the interval. Recommend follow-up to resolution. Electronically Signed   By: Dorise Bullion III M.D   On: 12/31/2016 19:08   Dg Abd 2 Views  Result Date: 01/17/2017 CLINICAL DATA:  Altered mental state. EXAM: ABDOMEN - 2 VIEW COMPARISON:  12/27/2014 FINDINGS: The bowel gas pattern is normal. There is no evidence of free air. Seed implants noted within the prostate gland. IMPRESSION: No abnormal bowel dilatation. Electronically Signed   By: Kerby Moors M.D.   On: 01/14/2017 10:39    Procedures Procedures (including critical care time)  Medications Ordered in ED Medications - No data to display   Initial Impression / Assessment and Plan / ED Course  I have reviewed the triage vital signs and the nursing notes.  Pertinent labs & imaging results that were available during my care of the patient were reviewed by me and considered in my medical decision making (see chart for details).     Patient emergency department with increased fatigue, altered mental status, excessive sleepiness, no appetite. His vital signs are normal except for mild tachycardia with heart rate 101. He does not have a fever rectally. His blood pressure, respiratory rate, oxygen saturations are normal. Will get labs, urinalysis, chest x-ray, CT head. Will monitor.  11:17 AM Lactic 4.84, pneumonia on CXR, pt still tachycardic and now dropping oxygen sat into 80s. Placed on O2. Code  sepsis called. Will start fluids, antibiotics, blood cultures  Drawn. Discussed with wife results and plan. Will also try albuterol to help with oxygen sat]   Sepsis - Repeat Assessment  Performed at:    12:04 PM   Vitals     Blood pressure 118/70, pulse (!) 104, temperature 98.7 F (37.1 C), temperature source Rectal, resp. rate 18, SpO2 93 %.  Heart:     Regular rate and rhythm and Tachycardic  Lungs:    Rales  Capillary Refill:   <2 sec  Peripheral Pulse:   Radial pulse palpable  Skin:     Dry   PT admitted to medicine for further treatment.   Final Clinical Impressions(s) / ED Diagnoses   Final diagnoses:  Altered mental state  Sepsis, due to unspecified organism [facility])  Community acquired pneumonia of right lower lobe of lung ([place])  Acute renal injury [facility])    New Prescriptions Current Discharge Medication List       Jeannett Senior, PA-C 01/12/17 0001    Charlesetta Shanks, MD 01/29/17 1551

## 2017-01-10 NOTE — ED Triage Notes (Signed)
Wife called EMS advised pt has been lethargic, not eating like usual and just wanting to sleep.  EMS advised patient was at baseline upon arrival, following commands, deficits on the right side from previous stroke.  Pt did complain of pain on his bottom.  Wife is at the bedside.

## 2017-01-11 ENCOUNTER — Inpatient Hospital Stay (HOSPITAL_COMMUNITY): Payer: Medicare Other

## 2017-01-11 ENCOUNTER — Ambulatory Visit: Payer: Medicare Other | Admitting: Physical Medicine & Rehabilitation

## 2017-01-11 ENCOUNTER — Encounter: Payer: Medicare Other | Attending: Physical Medicine & Rehabilitation

## 2017-01-11 ENCOUNTER — Encounter (HOSPITAL_COMMUNITY): Payer: Self-pay | Admitting: General Practice

## 2017-01-11 DIAGNOSIS — E46 Unspecified protein-calorie malnutrition: Secondary | ICD-10-CM

## 2017-01-11 DIAGNOSIS — A419 Sepsis, unspecified organism: Principal | ICD-10-CM

## 2017-01-11 DIAGNOSIS — J181 Lobar pneumonia, unspecified organism: Secondary | ICD-10-CM

## 2017-01-11 DIAGNOSIS — N390 Urinary tract infection, site not specified: Secondary | ICD-10-CM

## 2017-01-11 DIAGNOSIS — R739 Hyperglycemia, unspecified: Secondary | ICD-10-CM

## 2017-01-11 LAB — COMPREHENSIVE METABOLIC PANEL
ALBUMIN: 1.9 g/dL — AB (ref 3.5–5.0)
ALT: 12 U/L — AB (ref 17–63)
ANION GAP: 7 (ref 5–15)
AST: 13 U/L — ABNORMAL LOW (ref 15–41)
Alkaline Phosphatase: 62 U/L (ref 38–126)
BILIRUBIN TOTAL: 0.5 mg/dL (ref 0.3–1.2)
BUN: 30 mg/dL — ABNORMAL HIGH (ref 6–20)
CO2: 17 mmol/L — AB (ref 22–32)
CREATININE: 1.17 mg/dL (ref 0.61–1.24)
Calcium: 7.9 mg/dL — ABNORMAL LOW (ref 8.9–10.3)
Chloride: 115 mmol/L — ABNORMAL HIGH (ref 101–111)
GFR calc non Af Amer: 54 mL/min — ABNORMAL LOW (ref >60)
GLUCOSE: 124 mg/dL — AB (ref 65–99)
Potassium: 3.3 mmol/L — ABNORMAL LOW (ref 3.5–5.1)
SODIUM: 139 mmol/L (ref 135–145)
TOTAL PROTEIN: 4.8 g/dL — AB (ref 6.5–8.1)

## 2017-01-11 LAB — CBC WITH DIFFERENTIAL/PLATELET
BASOS ABS: 0 10*3/uL (ref 0.0–0.1)
BASOS PCT: 0 %
EOS ABS: 0 10*3/uL (ref 0.0–0.7)
EOS PCT: 0 %
HCT: 30.4 % — ABNORMAL LOW (ref 39.0–52.0)
Hemoglobin: 9.7 g/dL — ABNORMAL LOW (ref 13.0–17.0)
Lymphocytes Relative: 3 %
Lymphs Abs: 0.5 10*3/uL — ABNORMAL LOW (ref 0.7–4.0)
MCH: 27 pg (ref 26.0–34.0)
MCHC: 31.9 g/dL (ref 30.0–36.0)
MCV: 84.7 fL (ref 78.0–100.0)
MONO ABS: 1.9 10*3/uL — AB (ref 0.1–1.0)
MONOS PCT: 12 %
NEUTROS ABS: 13.6 10*3/uL — AB (ref 1.7–7.7)
Neutrophils Relative %: 85 %
PLATELETS: 321 10*3/uL (ref 150–400)
RBC: 3.59 MIL/uL — ABNORMAL LOW (ref 4.22–5.81)
RDW: 15.2 % (ref 11.5–15.5)
WBC: 16.1 10*3/uL — ABNORMAL HIGH (ref 4.0–10.5)

## 2017-01-11 LAB — HEMOGLOBIN A1C
HEMOGLOBIN A1C: 5.3 % (ref 4.8–5.6)
MEAN PLASMA GLUCOSE: 105 mg/dL

## 2017-01-11 LAB — BLOOD GAS, ARTERIAL
Acid-base deficit: 3.2 mmol/L — ABNORMAL HIGH (ref 0.0–2.0)
Bicarbonate: 19.8 mmol/L — ABNORMAL LOW (ref 20.0–28.0)
DRAWN BY: 277551
FIO2: 21
O2 SAT: 88.9 %
PCO2 ART: 28.1 mmHg — AB (ref 32.0–48.0)
Patient temperature: 99.8
pH, Arterial: 7.466 — ABNORMAL HIGH (ref 7.350–7.450)
pO2, Arterial: 55.9 mmHg — ABNORMAL LOW (ref 83.0–108.0)

## 2017-01-11 LAB — LACTIC ACID, PLASMA
Lactic Acid, Venous: 1.1 mmol/L (ref 0.5–1.9)
Lactic Acid, Venous: 0.8 mmol/L (ref 0.5–1.9)

## 2017-01-11 LAB — TROPONIN I
Troponin I: 0.03 ng/mL (ref <0.03)
Troponin I: 0.03 ng/mL (ref <0.03)
Troponin I: <0.03 ng/mL (ref <0.03)

## 2017-01-11 LAB — HIV ANTIBODY (ROUTINE TESTING W REFLEX): HIV SCREEN 4TH GENERATION: NONREACTIVE

## 2017-01-11 MED ORDER — POTASSIUM CHLORIDE 10 MEQ/100ML IV SOLN
10.0000 meq | INTRAVENOUS | Status: AC
  Administered 2017-01-11 (×4): 10 meq via INTRAVENOUS
  Filled 2017-01-11 (×2): qty 100

## 2017-01-11 MED ORDER — RIVASTIGMINE 4.6 MG/24HR TD PT24
13.3000 mg | MEDICATED_PATCH | Freq: Every day | TRANSDERMAL | Status: DC
  Administered 2017-01-12 – 2017-01-13 (×2): 13.8 mg via TRANSDERMAL
  Filled 2017-01-11 (×3): qty 3

## 2017-01-11 MED ORDER — ORAL CARE MOUTH RINSE
15.0000 mL | Freq: Two times a day (BID) | OROMUCOSAL | Status: DC
  Administered 2017-01-11 – 2017-01-13 (×3): 15 mL via OROMUCOSAL

## 2017-01-11 MED ORDER — FAMOTIDINE 20 MG PO TABS: 20.0000 mg | ORAL_TABLET | Freq: Every day | ORAL | Status: DC

## 2017-01-11 MED ORDER — AMPICILLIN-SULBACTAM SODIUM 3 (2-1) G IJ SOLR
3.0000 g | Freq: Three times a day (TID) | INTRAMUSCULAR | Status: DC
  Administered 2017-01-11 – 2017-01-12 (×3): 3 g via INTRAVENOUS
  Filled 2017-01-11 (×4): qty 3

## 2017-01-11 MED ORDER — RIVASTIGMINE 4.6 MG/24HR TD PT24
13.3000 mg | MEDICATED_PATCH | Freq: Every day | TRANSDERMAL | Status: DC
  Administered 2017-01-11: 13.8 mg via TRANSDERMAL
  Filled 2017-01-11 (×2): qty 3

## 2017-01-11 MED ORDER — LORAZEPAM 2 MG/ML IJ SOLN: 0.5000 mg | INTRAMUSCULAR | Status: DC | PRN
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
## 2017-01-11 NOTE — Evaluation (Signed)
Clinical/Bedside Swallow Evaluation Patient Details  Name: [name] MRN: [id number] Date of Birth: 12-29-1925  Today's Date: 01/11/2017 Time: SLP Start Time (ACUTE ONLY): 65 SLP Stop Time (ACUTE ONLY): 1540 SLP Time Calculation (min) (ACUTE ONLY): 17 min  Past Medical History:  Past Medical History:  Diagnosis Date  . Cancer ([place])   . Hearing loss   . Stroke [facility])    Past Surgical History:  Past Surgical History:  Procedure Laterality Date  . APPENDECTOMY    . KIDNEY SURGERY     HPI:  Ptis a 83 y.o.male admitted with AMS and found to have UTI and PNA. PMH includes dementia, prostate cancer, and CVA with dysphagia. Most recent MBS December 2015 recommended Dys 3 diet and nectar thick liquids due to silent penetration/aspiration of thin liquids. He had improved airway protection with thin liquids if he used a chin tuck and dry swallow, but he needed Max cues to utilize these strategies. He had a more recent BSE in March 2016 with overt signs of aspiration with thin liquids and ice chips, therefore Dys 3 diet and nectar thick liquids were recommended again.   Assessment / Plan / Recommendation Clinical Impression  Pt is not ready for PO intake due to his AMS and respiratory status. His wife confirms that he is still consuming nectar thick liquids at home, with no obvious difficulty and no prior cases of PNA. He was asleep upon SLP arrival with [employer] placed in his mouth to maintain his oxygen levels because he was breathing with an open-mouth posture. SLP provided stimulation for arousal and transitioned the [place] tubing to his nares to attempt PO trials. Pt actively resisted nectar thick liquids offered by spoon and by swap. Within only a few minutes his SpO2 began to drop into the high 80s and his RR climbed to the low 30s. [place] was placed back in his mouth where it had been at baseline, with stabilization of VS observed. Recommend to remain NPO for now with emphasis on thorough oral care as  pt will allow it. SLP will return on next date to reattempt POs if alertness and breathing have improved. SLP Visit Diagnosis: Dysphagia, pharyngeal phase (R13.13)    Aspiration Risk  Moderate aspiration risk    Diet Recommendation NPO   Medication Administration: Via alternative means    Other  Recommendations Oral Care Recommendations: Oral care QID   Follow up Recommendations  (tba)      Frequency and Duration min 2x/week  2 weeks       Prognosis Prognosis for Safe Diet Advancement: Good Barriers to Reach Goals: Cognitive deficits      Swallow Study   General HPI: Ptis a 83 y.o.male admitted with AMS and found to have UTI and PNA. PMH includes dementia, prostate cancer, and CVA with dysphagia. Most recent MBS December 2015 recommended Dys 3 diet and nectar thick liquids due to silent penetration/aspiration of thin liquids. He had improved airway protection with thin liquids if he used a chin tuck and dry swallow, but he needed Max cues to utilize these strategies. He had a more recent BSE in March 2016 with overt signs of aspiration with thin liquids and ice chips, therefore Dys 3 diet and nectar thick liquids were recommended again. Type of Study: Bedside Swallow Evaluation Previous Swallow Assessment: see HPI Diet Prior to this Study: NPO Temperature Spikes Noted: No Respiratory Status: Nasal cannula (placed in mouth as he is breathing through his mouth) History of Recent Intubation: No  Behavior/Cognition: Lethargic/Drowsy Oral Cavity Assessment: Dry Oral Care Completed by SLP: Other (Comment) (attempted - did not want to let swab in his mouth) Oral Cavity - Dentition: Adequate natural dentition Self-Feeding Abilities: Total assist Patient Positioning: Upright in bed Baseline Vocal Quality: Normal    Oral/Motor/Sensory Function     Ice Chips Ice chips: Not tested   Thin Liquid Thin Liquid: Not tested    Nectar Thick Nectar Thick Liquid: Impaired Presentation:  Spoon (swab) Oral Phase Impairments: Other (comment) (no acceptance)   Honey Thick Honey Thick Liquid: Not tested   Puree Puree: Not tested   Solid   GO   Solid: Not tested        Germain Osgood 01/11/2017,4:02 PM  Germain Osgood, M.A. CCC-SLP [phone]

## 2017-01-11 NOTE — Progress Notes (Signed)
The patient is lethargic not able to give po medications at this time. Notified MD Net[place]y. I will continue to monitor the patient closely.   Saddie Benders RN

## 2017-01-11 NOTE — Progress Notes (Signed)
PROGRESS NOTE    [name]  PFX:[id number] DOB: 1925/11/02 DOA: 01/17/2017 PCP: Lajean Manes, MD   Brief Narrative: [name] is a 83 y.o. male with a history of dementia, CVA, prostate cancer. He presented with altered mental status and found to have pneumonia.   Assessment & Plan:   Active Problems:   CAP (community acquired pneumonia)   Sepsis ([place])   Lower urinary tract infectious disease   Dehydration   Acute renal failure (ARF) (HCC)   Altered mental state   Protein calorie malnutrition (HCC)   Hyperglycemia   Leukocytosis   Community acquired pneumonia Possibly aspiration. -SLP eval -transition to Unasyn and azithromycin -discontinue ceftriaxone  UTI Urinalysis suggestive of UTI -culture pending -continue antibiotics  Sepsis Likely secondary to pneumonia. -management above -blood cultures pending  Altered mental status Possibly infectious etiology. -urine/blood culture pending -treatment above  Acute kidney injury Improved with IV fluids  Metabolic acidosis Possibly secondary to respiratory cause vs acute kidney injury. -ABG  Hyperglycemia Recommend outpatient follow-up  Hypoalbuminemia Likely secondary to chronic disease -dietitian consult when more alert   DVT prophylaxis: heparin Code Status: DNR/DNI Family Communication: Wife and daughter at bedside Disposition Plan: Discharge in several days   Consultants:   None  Procedures:   None  Antimicrobials:  Azithromycin  Ceftriaxone   Augmentin   Subjective: Patient reports no dyspnea or chest pain.  Objective: Vitals:   01/12/2017 1755 01/04/2017 2100 01/11/17 0400 01/11/17 0804  BP: (!) 139/56 126/64 126/64   Pulse: (!) 102 (!) 104 78 79  Resp: (!) 23 (!) 22 19 18   Temp: 98.2 F (36.8 C) 99.3 F (37.4 C) 99 F (37.2 C) 99 F (37.2 C)  TempSrc: Oral Oral Oral Oral  SpO2: (!) 87% 91% 98% 99%  Weight: 65.2 kg (143 lb 11.2 oz)  63.5 kg (140 lb)      Intake/Output Summary (Last 24 hours) at 01/11/17 1313 Last data filed at 01/11/17 1100  Gross per 24 hour  Intake          2277.08 ml  Output              475 ml  Net          1802.08 ml   Filed Weights   12/30/2016 1755 01/11/17 0400  Weight: 65.2 kg (143 lb 11.2 oz) 63.5 kg (140 lb)    Examination:  General exam: Appears calm and comfortable Respiratory system: Clear to auscultation. Respiratory effort normal. Mild tenderness on right sided chest palpation. Cardiovascular system: S1 & S2 heard, RRR. No murmurs. Gastrointestinal system: Abdomen is nondistended, soft and nontender. Normal bowel sounds heard. Central nervous system: Alert and oriented to person. Extremities: No edema. No calf tenderness. Contracture of left hand Skin: No cyanosis. No rashes Psychiatry: Judgement and insight appear impaired. Mood & affect depressed and flat.     Data Reviewed: I have personally reviewed following labs and imaging studies  CBC:  Recent Labs Lab 12/29/2016 1047 01/08/2017 1852 01/11/17 0752  WBC 28.0* 22.1* 16.1*  NEUTROABS 23.8* 19.1* 13.6*  HGB 14.8 11.4* 9.7*  HCT 45.3 35.8* 30.4*  MCV 85.6 85.2 84.7  PLT 390 334 329   Basic Metabolic Panel:  Recent Labs Lab 01/15/2017 1047 01/11/17 0752  NA 140 139  K 4.9 3.3*  CL 102 115*  CO2 25 17*  GLUCOSE 121* 124*  BUN 48* 30*  CREATININE 2.25* 1.17  CALCIUM 9.7 7.9*   GFR: CrCl cannot be calculated (  Unknown ideal weight.). Liver Function Tests:  Recent Labs Lab 12/30/2016 1047 01/11/17 0752  AST 22 13*  ALT 18 12*  ALKPHOS 87 62  BILITOT 0.7 0.5  PROT 6.8 4.8*  ALBUMIN 2.8* 1.9*    Recent Labs Lab 01/17/2017 1047  LIPASE 17   No results for input(s): AMMONIA in the last 168 hours. Coagulation Profile:  Recent Labs Lab 01/05/2017 1852  INR 1.48   Cardiac Enzymes:  Recent Labs Lab 01/05/2017 1852 01/11/17 0145 01/11/17 0752  TROPONINI 0.32* 0.03* 0.03*   BNP (last 3 results) No results for  input(s): PROBNP in the last 8760 hours. HbA1C:  Recent Labs  12/30/2016 1852  HGBA1C 5.3   CBG: No results for input(s): GLUCAP in the last 168 hours. Lipid Profile: No results for input(s): CHOL, HDL, LDLCALC, TRIG, CHOLHDL, LDLDIRECT in the last 72 hours. Thyroid Function Tests: No results for input(s): TSH, T4TOTAL, FREET4, T3FREE, THYROIDAB in the last 72 hours. Anemia Panel: No results for input(s): VITAMINB12, FOLATE, FERRITIN, TIBC, IRON, RETICCTPCT in the last 72 hours. Sepsis Labs:  Recent Labs Lab 01/12/2017 1407 01/12/2017 1852 01/15/2017 2121 01/11/17 0928  PROCALCITON  --  2.97  --   --   LATICACIDVEN 2.82* 2.7* 3.6* 1.1    Recent Results (from the past 240 hour(s))  Urine culture     Status: Abnormal (Preliminary result)   Collection Time: 12/25/2016  1:29 PM  Result Value Ref Range Status   Specimen Description URINE, RANDOM  Final   Special Requests NONE  Final   Culture >=100,000 COLONIES/mL GRAM NEGATIVE RODS (A)  Final   Report Status PENDING  Incomplete         Radiology Studies: Dg Chest 2 View  Result Date: 01/05/2017 CLINICAL DATA:  Altered mental status. EXAM: CHEST  2 VIEW COMPARISON:  07/24/2016 FINDINGS: Shallow lung inflation. Heart size is normal. Aorta is tortuous. Patchy infiltrate is identified at the right lung base. There is mild prominence of interstitial markings which may be chronic. There are biapical pleuroparenchymal changes. There is a wedge compression fracture of L3. This is new since 12/12/2014 wedge compression fracture of T8, likely remote. IMPRESSION: Right lower lobe infiltrate. Fracture at L3 is new since 2016 but of indeterminate age. Remote wedge fracture of T8. Electronically Signed   By: Nolon Nations M.D.   On: 01/22/2017 10:47   Ct Head Wo Contrast  Result Date: 01/18/2017 CLINICAL DATA:  The patient is lethargic. Right-sided deficits from previous stroke. EXAM: CT HEAD WITHOUT CONTRAST TECHNIQUE: Contiguous axial  images were obtained from the base of the skull through the vertex without intravenous contrast. COMPARISON:  July 16, 2015 FINDINGS: Brain: No subdural, epidural, or subarachnoid hemorrhage. Severe white matter changes are stable. No acute cortical ischemia or infarct. Ventricles and sulci are prominent but stable. Cerebellum, brainstem, and basal cisterns are unremarkable. No mass effect or midline shift. Vascular: Calcified atherosclerosis is seen in the intracranial carotid arteries. Skull: Normal. Negative for fracture or focal lesion. Sinuses/Orbits: Mild mucosal thickening in the maxillary sinuses with mucus posteriorly in the right sphenoid sinus. Paranasal sinuses, mastoid air cells, and middle ears are otherwise normal. Other: None. IMPRESSION: 1. No acute intracranial process.  Chronic white matter changes. Electronically Signed   By: Dorise Bullion III M.D   On: 01/05/2017 10:59   Dg Chest Port 1 View  Result Date: 01/02/2017 CLINICAL DATA:  Sepsis. EXAM: PORTABLE CHEST 1 VIEW COMPARISON:  January 10, 2017 FINDINGS: The left lung is clear.  Patchy infiltrate in the right lung, particularly the right base, is more prominent the interval. No other interval changes. IMPRESSION: Patchy infiltrate in the right lung, particularly the right base, is more prominent in the interval. Recommend follow-up to resolution. Electronically Signed   By: Dorise Bullion III M.D   On: 01/09/2017 19:08   Dg Abd 2 Views  Result Date: 01/18/2017 CLINICAL DATA:  Altered mental state. EXAM: ABDOMEN - 2 VIEW COMPARISON:  12/27/2014 FINDINGS: The bowel gas pattern is normal. There is no evidence of free air. Seed implants noted within the prostate gland. IMPRESSION: No abnormal bowel dilatation. Electronically Signed   By: Kerby Moors M.D.   On: 01/06/2017 10:39        Scheduled Meds: . atorvastatin  20 mg Oral q1800  . buPROPion  300 mg Oral Daily  . cholecalciferol  1,000 Units Oral Daily  . clopidogrel   75 mg Oral Daily  . divalproex  125 mg Oral Daily  . famotidine  20 mg Oral Daily  . finasteride  5 mg Oral Daily  . heparin  5,000 Units Subcutaneous Q8H  . mouth rinse  15 mL Mouth Rinse BID  . [START ON 01/12/2017] rivastigmine  13.8 mg Transdermal Daily  . sodium chloride flush  3 mL Intravenous Q12H   Continuous Infusions: . sodium chloride    . azithromycin 500 mg (01/11/17 1248)  . cefTRIAXone (ROCEPHIN)  IV    . dextrose 5 % and 0.9% NaCl 125 mL/hr at 01/15/2017 1847     LOS: 1 day     Cordelia Poche, MD [facility] 01/11/2017, 1:13 PM Pager: [phone]  If 7PM-7AM, please contact night-coverage [email or web address] Password [email or web address] 01/11/2017, 1:13 PM

## 2017-01-11 NOTE — Progress Notes (Signed)
Pharmacy Antibiotic Note [name] is a 83 y.o. male admitted on 01/16/2017 with AMS. Concern for aspiration pneumonia as well as UTI. Pharmacy has been consulted for Unasyn dosing.  Plan: 1. Unasyn 3 grams IV every 8 hours 2. Follow up GNR in urine to make sure it is covered by Unasyn     Weight: 140 lb (63.5 kg)  Temp (24hrs), Avg:98.9 F (37.2 C), Min:98.2 F (36.8 C), Max:99.3 F (37.4 C)   Recent Labs Lab 01/18/2017 1047  01/22/2017 1407 01/02/2017 1852 01/07/2017 2121 01/11/17 0752 01/11/17 0928 01/11/17 1218  WBC 28.0*  --   --  22.1*  --  16.1*  --   --   CREATININE 2.25*  --   --   --   --  1.17  --   --   LATICACIDVEN  --   < > 2.82* 2.7* 3.6*  --  1.1 0.8  < > = values in this interval not displayed.  CrCl cannot be calculated (Unknown ideal weight.).    Allergies  Allergen Reactions  . Memantine Hcl Other (See Comments)    unspecified    Thank you for allowing pharmacy to be a part of this patient's care.  Vincenza Hews, PharmD, BCPS 01/11/2017, 1:31 PM

## 2017-01-11 NOTE — Progress Notes (Signed)
ABG on hold until MD talks with family. Paged Dr Lonny Prude. MD aware

## 2017-01-12 DIAGNOSIS — J690 Pneumonitis due to inhalation of food and vomit: Secondary | ICD-10-CM

## 2017-01-12 DIAGNOSIS — L8990 Pressure ulcer of unspecified site, unspecified stage: Secondary | ICD-10-CM | POA: Insufficient documentation

## 2017-01-12 DIAGNOSIS — D72829 Elevated white blood cell count, unspecified: Secondary | ICD-10-CM

## 2017-01-12 LAB — URINE CULTURE: Culture: >=100000 — AB

## 2017-01-12 LAB — CBC
HCT: 31.9 % — ABNORMAL LOW (ref 39.0–52.0)
Hemoglobin: 10 g/dL — ABNORMAL LOW (ref 13.0–17.0)
MCH: 27 pg (ref 26.0–34.0)
MCHC: 31.3 g/dL (ref 30.0–36.0)
MCV: 86 fL (ref 78.0–100.0)
Platelets: 336 10*3/uL (ref 150–400)
RBC: 3.71 MIL/uL — AB (ref 4.22–5.81)
RDW: 15.5 % (ref 11.5–15.5)
WBC: 13.9 10*3/uL — ABNORMAL HIGH (ref 4.0–10.5)

## 2017-01-12 LAB — BASIC METABOLIC PANEL
Anion gap: 6 (ref 5–15)
BUN: 20 mg/dL (ref 6–20)
CHLORIDE: 117 mmol/L — AB (ref 101–111)
CO2: 19 mmol/L — AB (ref 22–32)
CREATININE: 1.07 mg/dL (ref 0.61–1.24)
Calcium: 8.1 mg/dL — ABNORMAL LOW (ref 8.9–10.3)
GFR calc non Af Amer: 60 mL/min — ABNORMAL LOW (ref >60)
Glucose, Bld: 95 mg/dL (ref 65–99)
Potassium: 3.6 mmol/L (ref 3.5–5.1)
Sodium: 142 mmol/L (ref 135–145)

## 2017-01-12 MED ORDER — PIPERACILLIN-TAZOBACTAM 3.375 G IVPB
3.3750 g | Freq: Three times a day (TID) | INTRAVENOUS | Status: DC
  Administered 2017-01-12 – 2017-01-13 (×3): 3.375 g via INTRAVENOUS
  Filled 2017-01-12 (×5): qty 50

## 2017-01-12 MED ORDER — PIPERACILLIN-TAZOBACTAM 3.375 G IVPB 30 MIN
3.3750 g | Freq: Once | INTRAVENOUS | Status: AC
  Administered 2017-01-12: 3.375 g via INTRAVENOUS
  Filled 2017-01-12: qty 50

## 2017-01-12 MED ORDER — VANCOMYCIN HCL 10 G IV SOLR
1250.0000 mg | INTRAVENOUS | Status: DC
  Administered 2017-01-12: 1250 mg via INTRAVENOUS
  Filled 2017-01-12 (×2): qty 1250

## 2017-01-12 NOTE — Progress Notes (Signed)
SLP Cancellation Note  Patient Details Name: NEVAEH CASILLAS MRN: [id number] DOB: 07-10-1926   Cancelled treatment:       Reason Eval/Treat Not Completed: Patient not medically ready; Mentation and respiratory status prevent readiness for PO trials. ST to continue to monitor.   Arvil Chaco MA, CCC-SLP Acute Care Speech Language Pathologist    Levi Aland 01/12/2017, 10:47 AM

## 2017-01-12 NOTE — Consult Note (Signed)
[facility] CM Primary Care Navigator  01/12/2017  [name] [birth year] [id number]   Went to see patientat the bedside to identify possible discharge needs. Met with wife Herbert Pun), daughter Vinnie Level) and a caregiver. Wifereports that patient stated "feeling sick". He was weak, not eating as much and has decreased responsiveness which led to this admission. Patient's wife mentioned that primary care provider used to be Dr. Gaynelle Arabian with [facility] practice but has decided to change to Dr. Lajean Manes (with geriatric specialty) in the same practice and has an appointment to see him in July. Wife understands to ask for an earlier appointment from the office to follow-up with patient after discharge.   Patient's wife shared using [employer] data [employer] at [employer] at [place] in [place] to obtain medications without any problem.   Wife reports that she and caregivers manage his medications at home.  She states that caregivers drive andtransport patient tohisdoctors'appointments.  Wife verbalized that she can not physically assist patient but has 24/7 caregivers at home.  Anticipated discharge plan is homewith caregivers assistance according to wife. Wife voiced understanding to call primary care provider's officewhen patient returns home,for a post discharge follow-up appointment within a week or sooner if needed.Patient letter (with PCP's contact number) was provided as a reminder.  Explained to wife about [facility] CM services available for health management and she had verbally agreed with[facility] [employer] to help monitor patient's recovery at home.   Referral was made for [facility]neumoni[employer] after discharge.    For questions, please contact:  Dannielle Huh, BSN, RN- [facility] Primary Care Navigator  Telephone: [phone] [place]

## 2017-01-12 NOTE — Progress Notes (Signed)
PROGRESS NOTE    SOLAN VOSLER  UVO:[id number] DOB: [birth year] DOA: 01/02/2017 PCP: Lajean Manes, MD   Brief Narrative: [name] is a 83 y.o. male with a history of dementia, CVA, prostate cancer. He presented with altered mental status and found to have pneumonia and pseudomonas UTI   Assessment & Plan:   Active Problems:   CAP (community acquired pneumonia)   Sepsis ([place])   Lower urinary tract infectious disease   Dehydration   Acute renal failure (ARF) (HCC)   Altered mental state   Protein calorie malnutrition (HCC)   Hyperglycemia   Leukocytosis   Pressure injury of skin   Aspiration pneumonia -SLP eval: NPO -discontinue Unasyn -start Zosyn  CAUTI Present on admission. Urinalysis suggestive of UTI. Urine culture growing pseudomonas. Discussed with Urology, Dr. Tresa Moore, who states no indication for catheter exchange. Two cultures obtained and on second culture, Enterococcus also growing -Zosyn -start vancomycin  Sepsis Likely secondary to pneumonia +/- UTI -management above -blood cultures pending  Altered mental status Possibly infectious etiology. Urine culture significant for UTI -treatment above  Acute kidney injury Resolved. Improved with IV fluids  Respiratory alkalosis Subsequent metabolic acidosis to compensate. Likely pneumonia is etiology. CO2 improved on BMP.  Hyperglycemia Recommend outpatient follow-up  Hypoalbuminemia Likely secondary to chronic disease -dietitian consult when more alert  Lung nodule 15mm. 6-12 month non-contrast CT recommended, then 18-24 month (from now) if high risk  Soft tissue density Located mid-esophagus. Unsure if this is obstructing. Patient NPO currently secondary to poor mental status. -GI consult outpatient unless presents an issues with regard to swallowing while inpatient   DVT prophylaxis: heparin Code Status: DNR/DNI Family Communication: Wife and daughter at bedside Disposition Plan:  Discharge in several days   Consultants:   None  Procedures:   None  Antimicrobials:  Azithromycin  Ceftriaxone   Augmentin  Zosyn (6/19>>   Subjective: Afebrile overnight.  Objective: Vitals:   01/11/17 1647 01/11/17 2000 01/12/17 0025 01/12/17 0600  BP:  139/68    Pulse: (!) 106 (!) 101    Resp: 18 (!) 30    Temp:  99.3 F (37.4 C)    TempSrc:  Axillary    SpO2: 90% 91% 90%   Weight:    67.7 kg (149 lb 4 oz)    Intake/Output Summary (Last 24 hours) at 01/12/17 0936 Last data filed at 01/12/17 0554  Gross per 24 hour  Intake          2943.42 ml  Output              525 ml  Net          2418.42 ml   Filed Weights   01/09/2017 1755 01/11/17 0400 01/12/17 0600  Weight: 65.2 kg (143 lb 11.2 oz) 63.5 kg (140 lb) 67.7 kg (149 lb 4 oz)    Examination:  General exam: Calm and comfortable Respiratory system: Rhonchi bilaterally. Slight tachypnea. Cardiovascular system: S1 and S2 heard with Normal rate with regular rhythm. Gastrointestinal system: Soft, non-tender, non-distended. Normal bowel sounds Central nervous system: Alert and arouses to voice. Not following commands. Extremities: Contracture of left hand. Skin: No cyanosis. No rashes Psychiatry: Judgement/insight impaired. Flat affect.    Data Reviewed: I have personally reviewed following labs and imaging studies  CBC:  Recent Labs Lab 01/22/2017 1047 12/25/2016 1852 01/11/17 0752  WBC 28.0* 22.1* 16.1*  NEUTROABS 23.8* 19.1* 13.6*  HGB 14.8 11.4* 9.7*  HCT 45.3 35.8* 30.4*  MCV 85.6  85.2 84.7  PLT 390 334 425   Basic Metabolic Panel:  Recent Labs Lab 01/15/2017 1047 01/11/17 0752  NA 140 139  K 4.9 3.3*  CL 102 115*  CO2 25 17*  GLUCOSE 121* 124*  BUN 48* 30*  CREATININE 2.25* 1.17  CALCIUM 9.7 7.9*   GFR: CrCl cannot be calculated (Unknown ideal weight.). Liver Function Tests:  Recent Labs Lab 01/09/2017 1047 01/11/17 0752  AST 22 13*  ALT 18 12*  ALKPHOS 87 62  BILITOT  0.7 0.5  PROT 6.8 4.8*  ALBUMIN 2.8* 1.9*    Recent Labs Lab 01/09/2017 1047  LIPASE 17   No results for input(s): AMMONIA in the last 168 hours. Coagulation Profile:  Recent Labs Lab 12/30/2016 1852  INR 1.48   Cardiac Enzymes:  Recent Labs Lab 12/26/2016 1852 01/11/17 0145 01/11/17 0752 01/11/17 1218  TROPONINI 0.32* 0.03* 0.03* <0.03   BNP (last 3 results) No results for input(s): PROBNP in the last 8760 hours. HbA1C:  Recent Labs  01/07/2017 1852  HGBA1C 5.3   CBG: No results for input(s): GLUCAP in the last 168 hours. Lipid Profile: No results for input(s): CHOL, HDL, LDLCALC, TRIG, CHOLHDL, LDLDIRECT in the last 72 hours. Thyroid Function Tests: No results for input(s): TSH, T4TOTAL, FREET4, T3FREE, THYROIDAB in the last 72 hours. Anemia Panel: No results for input(s): VITAMINB12, FOLATE, FERRITIN, TIBC, IRON, RETICCTPCT in the last 72 hours. Sepsis Labs:  Recent Labs Lab 01/23/2017 1852 01/09/2017 2121 01/11/17 0928 01/11/17 1218  PROCALCITON 2.97  --   --   --   LATICACIDVEN 2.7* 3.6* 1.1 0.8    Recent Results (from the past 240 hour(s))  Blood culture (routine x 2)     Status: None (Preliminary result)   Collection Time: 01/03/2017 11:00 AM  Result Value Ref Range Status   Specimen Description BLOOD LEFT ARM  Final   Special Requests   Final    BOTTLES DRAWN AEROBIC AND ANAEROBIC Blood Culture adequate volume   Culture NO GROWTH 1 DAY  Final   Report Status PENDING  Incomplete  Blood culture (routine x 2)     Status: None (Preliminary result)   Collection Time: 01/15/2017  1:20 PM  Result Value Ref Range Status   Specimen Description BLOOD RIGHT HAND  Final   Special Requests IN PEDIATRIC BOTTLE Blood Culture adequate volume  Final   Culture NO GROWTH 1 DAY  Final   Report Status PENDING  Incomplete  Urine culture     Status: Abnormal (Preliminary result)   Collection Time: 01/11/2017  1:29 PM  Result Value Ref Range Status   Specimen Description  URINE, RANDOM  Final   Special Requests NONE  Final   Culture >=100,000 COLONIES/mL PSEUDOMONAS AERUGINOSA (A)  Final   Report Status PENDING  Incomplete   Organism ID, Bacteria PSEUDOMONAS AERUGINOSA (A)  Final      Susceptibility   Pseudomonas aeruginosa - MIC*    CEFTAZIDIME 4 SENSITIVE Sensitive     CIPROFLOXACIN <=0.25 SENSITIVE Sensitive     GENTAMICIN <=1 SENSITIVE Sensitive     IMIPENEM 2 SENSITIVE Sensitive     PIP/TAZO 8 SENSITIVE Sensitive     CEFEPIME 4 SENSITIVE Sensitive     * >=100,000 COLONIES/mL PSEUDOMONAS AERUGINOSA  Urine culture     Status: Abnormal (Preliminary result)   Collection Time: 01/11/2017  7:08 PM  Result Value Ref Range Status   Specimen Description URINE, CATHETERIZED  Final   Special Requests NONE  Final  Culture >=100,000 COLONIES/mL GRAM NEGATIVE RODS (A)  Final   Report Status PENDING  Incomplete         Radiology Studies: Dg Chest 2 View  Result Date: 01/23/2017 CLINICAL DATA:  Altered mental status. EXAM: CHEST  2 VIEW COMPARISON:  07/24/2016 FINDINGS: Shallow lung inflation. Heart size is normal. Aorta is tortuous. Patchy infiltrate is identified at the right lung base. There is mild prominence of interstitial markings which may be chronic. There are biapical pleuroparenchymal changes. There is a wedge compression fracture of L3. This is new since 12/12/2014 wedge compression fracture of T8, likely remote. IMPRESSION: Right lower lobe infiltrate. Fracture at L3 is new since 2016 but of indeterminate age. Remote wedge fracture of T8. Electronically Signed   By: Nolon Nations M.D.   On: 01/17/2017 10:47   Ct Head Wo Contrast  Result Date: 12/25/2016 CLINICAL DATA:  The patient is lethargic. Right-sided deficits from previous stroke. EXAM: CT HEAD WITHOUT CONTRAST TECHNIQUE: Contiguous axial images were obtained from the base of the skull through the vertex without intravenous contrast. COMPARISON:  July 16, 2015 FINDINGS: Brain: No  subdural, epidural, or subarachnoid hemorrhage. Severe white matter changes are stable. No acute cortical ischemia or infarct. Ventricles and sulci are prominent but stable. Cerebellum, brainstem, and basal cisterns are unremarkable. No mass effect or midline shift. Vascular: Calcified atherosclerosis is seen in the intracranial carotid arteries. Skull: Normal. Negative for fracture or focal lesion. Sinuses/Orbits: Mild mucosal thickening in the maxillary sinuses with mucus posteriorly in the right sphenoid sinus. Paranasal sinuses, mastoid air cells, and middle ears are otherwise normal. Other: None. IMPRESSION: 1. No acute intracranial process.  Chronic white matter changes. Electronically Signed   By: Dorise Bullion III M.D   On: 01/20/2017 10:59   Ct Chest Wo Contrast  Result Date: 01/11/2017 CLINICAL DATA:  Acute onset of hypoxia.  Initial encounter. EXAM: CT CHEST WITHOUT CONTRAST TECHNIQUE: Multidetector CT imaging of the chest was performed following the standard protocol without IV contrast. COMPARISON:  Chest radiograph performed 01/05/2017 FINDINGS: Cardiovascular: Diffuse coronary artery calcifications are seen. Scattered calcification is seen along the thoracic aorta. The great vessels are grossly unremarkable in appearance. The heart remains normal in size. Mediastinum/Nodes: The mid esophagus is not well assessed, with vague prominent soft tissue density. An underlying mass cannot be entirely excluded. No mediastinal lymphadenopathy is seen. No pericardial effusion is identified. The thyroid gland is unremarkable appearance. No axillary lymphadenopathy is appreciated. Lungs/Pleura: Dense right lower lobe pneumonia is noted, with associated air bronchograms. Additional patchy right upper lobe and left lower lobe airspace opacities are seen. Small bilateral pleural effusions are noted. No pneumothorax is seen. [place] An 8 mm nodule is noted at the right lung apex. Underlying bronchiectasis is  noted at the upper lung lobes. Scarring and calcification are noted at the lung apices bilaterally. Upper Abdomen: The visualized portions of the liver and spleen are grossly unremarkable. Musculoskeletal: No acute osseous abnormalities are identified. Mild degenerative change is noted along the cervical spine. The visualized musculature is unremarkable in appearance. IMPRESSION: 1. Dense right lower lobe pneumonia, with associated air bronchograms. Additional patchy right upper lobe and left lower lobe pneumonia noted. 2. Small bilateral pleural effusions seen. 3. **An incidental finding of potential clinical significance has been found. 8 mm nodule noted at the right lung apex. Non-contrast chest CT at 6-12 months is recommended. If the nodule is stable at time of repeat CT, then future CT at 18-24 months (from today's scan)  is considered optional for low-risk patients, but is recommended for high-risk patients. This recommendation follows the consensus statement: Guidelines for Management of Incidental Pulmonary Nodules Detected on CT Images: From the Fleischner Society 2017; Radiology 2017; 284:228-243.** 4. Bronchiectasis at the upper lung lobes. Scarring and calcification at the lung apices. 5. Diffuse coronary artery calcifications seen. 6. Mid esophagus is not well assessed, with vague prominent soft tissue density. An underlying mass cannot be entirely excluded. Would correlate for any associated symptoms, and consider endoscopy for further evaluation. Electronically Signed   By: Garald Balding M.D.   On: 01/11/2017 22:42   Dg Chest Port 1 View  Result Date: 01/17/2017 CLINICAL DATA:  Sepsis. EXAM: PORTABLE CHEST 1 VIEW COMPARISON:  January 10, 2017 FINDINGS: The left lung is clear. Patchy infiltrate in the right lung, particularly the right base, is more prominent the interval. No other interval changes. IMPRESSION: Patchy infiltrate in the right lung, particularly the right base, is more prominent in the  interval. Recommend follow-up to resolution. Electronically Signed   By: Dorise Bullion III M.D   On: 01/05/2017 19:08   Dg Abd 2 Views  Result Date: 12/26/2016 CLINICAL DATA:  Altered mental state. EXAM: ABDOMEN - 2 VIEW COMPARISON:  12/27/2014 FINDINGS: The bowel gas pattern is normal. There is no evidence of free air. Seed implants noted within the prostate gland. IMPRESSION: No abnormal bowel dilatation. Electronically Signed   By: Kerby Moors M.D.   On: 01/12/2017 10:39        Scheduled Meds: . atorvastatin  20 mg Oral q1800  . buPROPion  300 mg Oral Daily  . cholecalciferol  1,000 Units Oral Daily  . clopidogrel  75 mg Oral Daily  . divalproex  125 mg Oral Daily  . famotidine  20 mg Oral Daily  . finasteride  5 mg Oral Daily  . heparin  5,000 Units Subcutaneous Q8H  . mouth rinse  15 mL Mouth Rinse BID  . rivastigmine  13.8 mg Transdermal Daily  . sodium chloride flush  3 mL Intravenous Q12H   Continuous Infusions: . sodium chloride    . dextrose 5 % and 0.9% NaCl 75 mL/hr at 01/11/17 1831  . piperacillin-tazobactam    . piperacillin-tazobactam (ZOSYN)  IV       LOS: 2 days     Cordelia Poche, MD [facility] 01/12/2017, 9:36 AM Pager: [phone]  If 7PM-7AM, please contact night-coverage [email or web address] Password [facility] 01/12/2017, 9:36 AM

## 2017-01-12 NOTE — Progress Notes (Signed)
Pharmacy Antibiotic Note [name] is a 83 y.o. male admitted on 01/17/2017 with pseudomonas UTI and concern for aspiration pneumonia. Pharmacy has been consulted for Zosyn dosing.  Plan: Zosyn 3.375g IV q8h (4 hour infusion). Would consider stopping Azithromycin soon.   Weight: 149 lb 4 oz (67.7 kg)  Temp (24hrs), Avg:99.2 F (37.3 C), Min:99 F (37.2 C), Max:99.3 F (37.4 C)   Recent Labs Lab 01/19/2017 1047  01/07/2017 1407 12/28/2016 1852 01/04/2017 2121 01/11/17 0752 01/11/17 0928 01/11/17 1218  WBC 28.0*  --   --  22.1*  --  16.1*  --   --   CREATININE 2.25*  --   --   --   --  1.17  --   --   LATICACIDVEN  --   < > 2.82* 2.7* 3.6*  --  1.1 0.8  < > = values in this interval not displayed.  CrCl cannot be calculated (Unknown ideal weight.).    Allergies  Allergen Reactions  . Memantine Hcl Other (See Comments)    unspecified    Thank you for allowing pharmacy to be a part of this patient's care.  Vincenza Hews, PharmD, BCPS 01/12/2017, 8:49 AM

## 2017-01-12 NOTE — Progress Notes (Signed)
Pharmacy Antibiotic Note  [name] is a 83 y.o. male admitted on 12/31/2016 with UTI.  Pharmacy has been consulted for vancomycin dosing.  Urine culture grew enterococcal species; also grew pseudomonas on zosyn  Vancomycin trough 10-15  Plan: - vancomycin 1250 mg iv q24h - monitor renal function - check vancomycin trough when it's appropriate  Weight: 149 lb 4 oz (67.7 kg)  Temp (24hrs), Avg:97.9 F (36.6 C), Min:97.9 F (36.6 C), Max:97.9 F (36.6 C)   Recent Labs Lab 01/19/2017 1047  01/18/2017 1407 01/05/2017 1852 12/27/2016 2121 01/11/17 0752 01/11/17 0928 01/11/17 1218 01/12/17 1121  WBC 28.0*  --   --  22.1*  --  16.1*  --   --  13.9*  CREATININE 2.25*  --   --   --   --  1.17  --   --  1.07  LATICACIDVEN  --   < > 2.82* 2.7* 3.6*  --  1.1 0.8  --   < > = values in this interval not displayed.  CrCl cannot be calculated (Unknown ideal weight.).    Allergies  Allergen Reactions  . Memantine Hcl Other (See Comments)    unspecified    Thank you for allowing pharmacy to be a part of this patient's care.  Jotham Ahn, Tsz-Yin 01/12/2017 10:10 PM

## 2017-01-13 LAB — URINE CULTURE: Culture: >=100000 — AB

## 2017-01-13 LAB — BASIC METABOLIC PANEL
Anion gap: 10 (ref 5–15)
BUN: 19 mg/dL (ref 6–20)
CALCIUM: 8.2 mg/dL — AB (ref 8.9–10.3)
CHLORIDE: 116 mmol/L — AB (ref 101–111)
CO2: 18 mmol/L — AB (ref 22–32)
CREATININE: 1.14 mg/dL (ref 0.61–1.24)
GFR calc non Af Amer: 55 mL/min — ABNORMAL LOW (ref >60)
Glucose, Bld: 114 mg/dL — ABNORMAL HIGH (ref 65–99)
Potassium: 3.6 mmol/L (ref 3.5–5.1)
SODIUM: 144 mmol/L (ref 135–145)

## 2017-01-13 LAB — CBC
HCT: 36.4 % — ABNORMAL LOW (ref 39.0–52.0)
Hemoglobin: 11.2 g/dL — ABNORMAL LOW (ref 13.0–17.0)
MCH: 26.9 pg (ref 26.0–34.0)
MCHC: 30.8 g/dL (ref 30.0–36.0)
MCV: 87.3 fL (ref 78.0–100.0)
Platelets: 462 10*3/uL — ABNORMAL HIGH (ref 150–400)
RBC: 4.17 MIL/uL — ABNORMAL LOW (ref 4.22–5.81)
RDW: 15.5 % (ref 11.5–15.5)
WBC: 22.8 10*3/uL — ABNORMAL HIGH (ref 4.0–10.5)

## 2017-01-13 MED ORDER — MORPHINE SULFATE (PF) 2 MG/ML IV SOLN
0.5000 mg | INTRAVENOUS | Status: DC | PRN
  Administered 2017-01-13: 0.5 mg via INTRAVENOUS
  Filled 2017-01-13: qty 1

## 2017-01-15 LAB — CULTURE, BLOOD (ROUTINE X 2)
CULTURE: NO GROWTH
Culture: NO GROWTH
SPECIAL REQUESTS: ADEQUATE
Special Requests: ADEQUATE

## 2017-01-24 NOTE — Care Management Important Message (Signed)
Important Message  Patient Details  Name: [name] MRN: [id number] Date of Birth: [birth year]   Medicare Important Message Given:  Yes    Jonay Hitchcock [place] 02/09/2017, 10:13 AM

## 2017-01-24 NOTE — Progress Notes (Signed)
Post mortem care and checklist complete . Notified bed control. Patient taken to [facility].   Saddie Benders RN

## 2017-01-24 NOTE — Progress Notes (Signed)
Pt becoming more SOB, HR now in the 120's sustaining, O2 sats 85% on NRB. Dr. Hal Hope paged for interventions, and RR notified. Failed attempt to reach pt's wife. Pt's daughter Vinnie Level called. Daughter was able to speak to Dr. Hal Hope about pt's decreasing status and increasing SOB. Plan now is to make pt comfortable with PRN morphine. Daughter does not want BiPAP at this time. Will continue to monitor closely.  Jacqlyn Larsen, RN

## 2017-01-24 NOTE — Progress Notes (Signed)
The patients pulse oximetry is sustaining ~80 NRB at 15 liters, heart rate in the 120's, Resp ~40 per minute, BP 145/85. MD Wendee Beavers notified. Patient wife and daugther at bedside refusing Bi pap and PRN Morphine at this time. The patient has been repositioned and appears to be resting comfortably.  I will continue to monitor the patient closely.   Saddie Benders RN

## 2017-01-24 NOTE — Procedures (Signed)
NTS done.   Obtained small amount of thick, tan secretions. RR 44, HR 110, and sp02 87% on NRB.

## 2017-01-24 NOTE — Progress Notes (Signed)
   Introduced Art gallery manager.  Per family, chaplaincy services not needed.  Family has local clergy support ([place] [facility]).  Will follow, as needed.

## 2017-01-24 NOTE — Progress Notes (Signed)
SLP Cancellation Note  Patient Details Name: [name] MRN: [id number] DOB: September 05, 1925   Cancelled treatment:       Reason Eval/Treat Not Completed: Medical issues which prohibited therapy   Dessie Delcarlo 02/04/2017, 10:19 AM

## 2017-01-24 NOTE — Death Summary Note (Signed)
Death Summary  [name] KCL:[id number] DOB: 12/17/1925 DOA: 02/04/2017  PCP: Lajean Manes, MD  Admit date: 02-04-2017 Date of Death: 02/07/2017  Final Diagnoses:  Active Problems:   CAP (community acquired pneumonia)   Sepsis ([place])   Lower urinary tract infectious disease   Dehydration   Acute renal failure (ARF) (HCC)   Altered mental state   Protein calorie malnutrition (HCC)   Hyperglycemia   Leukocytosis   Pressure injury of skin   History of present illness:  83 year old with history of right-sided hemiplegia secondary to stroke, vascular dementia and protein calorie malnutrition who presented with sepsis secondary to UTI and pneumonia.  Hospital Course:  Sepsis/UTI/pneumonia - Patient and family were offered aggressive measures but family declined did not want any life prolonging interventions including CPR or BiPAP. They did however except antibiotics of which we continued. Despite antibiotic administration and supplemental oxygen patient continued to deteriorate until patient passed that time listed below.   Time: 4944  Signed:  Velvet Bathe  [facility] 02-07-17, 3:58 PM

## 2017-01-24 NOTE — Plan of Care (Signed)
Problem: Physical Regulation: Goal: Ability to maintain clinical measurements within normal limits will improve Outcome: Not Progressing Pt respiratory status continues to decline. Minimal interventions per family requests. Pt comfortable at this time. PRN morphine available if needed.

## 2017-01-24 NOTE — Progress Notes (Signed)
RN called for a second set of eyes. Dr. Hal Hope had been paged with plan to come to bedside. Pt has been on NRB sats steadily declining to mid 870's and HR 120-140's. Dr. Hal Hope at bedside and spoke to daughter about placing pt on Bipap, daughter refused Bipap at this time, did agree to Morphine PRN. No interventions from RRT

## 2017-01-24 NOTE — Progress Notes (Signed)
Patient expired time of death called at 4 verified by two RN. Saddie Benders and Sesser notified. Family at bedside.   Saddie Benders RN

## 2017-01-24 NOTE — Progress Notes (Signed)
Pt appeared to be in resp distress, RR 41, O2 sats in the high 80's on NRB. Oral care and suctioning not as effective as prior attempts. Pt lungs sounds coarse and congestion audible while coughing. Paged [facility] concerning orders for deep suctioning. Orders received and RT notified. Suction set up at bedside. Will continue to monitor closely.  Jacqlyn Larsen, RN

## 2017-01-24 DEATH — deceased

## 2017-02-02 ENCOUNTER — Ambulatory Visit: Payer: Medicare Other | Admitting: Physical Medicine & Rehabilitation
